# Patient Record
Sex: Female | Born: 1990 | Race: Black or African American | Hispanic: No | Marital: Single | Smoking: Former smoker
Health system: Southern US, Community
[De-identification: ages and names within clinical notes are randomized; demographics above are authoritative.]

## PROBLEM LIST (undated history)

## (undated) ENCOUNTER — Inpatient Hospital Stay (HOSPITAL_COMMUNITY): Payer: Self-pay

## (undated) DIAGNOSIS — Z8489 Family history of other specified conditions: Secondary | ICD-10-CM

## (undated) DIAGNOSIS — Z8619 Personal history of other infectious and parasitic diseases: Secondary | ICD-10-CM

## (undated) DIAGNOSIS — O139 Gestational [pregnancy-induced] hypertension without significant proteinuria, unspecified trimester: Secondary | ICD-10-CM

## (undated) DIAGNOSIS — D649 Anemia, unspecified: Secondary | ICD-10-CM

## (undated) DIAGNOSIS — Z87891 Personal history of nicotine dependence: Secondary | ICD-10-CM

## (undated) DIAGNOSIS — F419 Anxiety disorder, unspecified: Secondary | ICD-10-CM

## (undated) DIAGNOSIS — O1490 Unspecified pre-eclampsia, unspecified trimester: Secondary | ICD-10-CM

## (undated) DIAGNOSIS — Z9089 Acquired absence of other organs: Secondary | ICD-10-CM

## (undated) DIAGNOSIS — O43129 Velamentous insertion of umbilical cord, unspecified trimester: Secondary | ICD-10-CM

## (undated) DIAGNOSIS — R519 Headache, unspecified: Secondary | ICD-10-CM

## (undated) DIAGNOSIS — R51 Headache: Secondary | ICD-10-CM

## (undated) HISTORY — DX: Headache: R51

## (undated) HISTORY — PX: TONSILLECTOMY: SUR1361

## (undated) HISTORY — DX: Unspecified pre-eclampsia, unspecified trimester: O14.90

## (undated) HISTORY — DX: Anxiety disorder, unspecified: F41.9

## (undated) HISTORY — DX: Headache, unspecified: R51.9

## (undated) HISTORY — DX: Personal history of other infectious and parasitic diseases: Z86.19

## (undated) HISTORY — DX: Family history of other specified conditions: Z84.89

## (undated) HISTORY — DX: Velamentous insertion of umbilical cord, unspecified trimester: O43.129

---
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
## 2010-01-18 ENCOUNTER — Emergency Department (HOSPITAL_COMMUNITY)
Admission: EM | Admit: 2010-01-18 | Discharge: 2010-01-18 | Payer: Self-pay | Source: Home / Self Care | Admitting: Emergency Medicine

## 2010-02-01 ENCOUNTER — Emergency Department (HOSPITAL_COMMUNITY)
Admission: EM | Admit: 2010-02-01 | Discharge: 2010-02-01 | Payer: Self-pay | Source: Home / Self Care | Admitting: Emergency Medicine

## 2010-02-01 LAB — URINALYSIS, ROUTINE W REFLEX MICROSCOPIC
Bilirubin Urine: NEGATIVE
Hemoglobin, Urine: NEGATIVE
Ketones, ur: NEGATIVE mg/dL
Nitrite: NEGATIVE
Protein, ur: NEGATIVE mg/dL
Specific Gravity, Urine: 1.014 (ref 1.005–1.030)
Urine Glucose, Fasting: NEGATIVE mg/dL
Urobilinogen, UA: 1 mg/dL (ref 0.0–1.0)
pH: 6.5 (ref 5.0–8.0)

## 2010-02-01 LAB — POCT PREGNANCY, URINE: Preg Test, Ur: NEGATIVE

## 2010-02-01 LAB — URINE MICROSCOPIC-ADD ON

## 2010-03-24 ENCOUNTER — Inpatient Hospital Stay (INDEPENDENT_AMBULATORY_CARE_PROVIDER_SITE_OTHER)
Admission: RE | Admit: 2010-03-24 | Discharge: 2010-03-24 | Disposition: A | Discharge status: Home / Self Care | Payer: 59 | Source: Ambulatory Visit | Attending: Family Medicine | Admitting: Family Medicine

## 2010-03-24 DIAGNOSIS — J020 Streptococcal pharyngitis: Secondary | ICD-10-CM

## 2010-03-24 LAB — POCT URINALYSIS DIPSTICK
Hgb urine dipstick: NEGATIVE
Nitrite: NEGATIVE
Protein, ur: 30 mg/dL — AB
Specific Gravity, Urine: 1.02 (ref 1.005–1.030)
Urine Glucose, Fasting: NEGATIVE mg/dL
Urobilinogen, UA: 1 mg/dL (ref 0.0–1.0)
pH: 7 (ref 5.0–8.0)

## 2010-03-24 LAB — POCT PREGNANCY, URINE: Preg Test, Ur: NEGATIVE

## 2010-03-24 LAB — POCT RAPID STREP A (OFFICE): Streptococcus, Group A Screen (Direct): POSITIVE — AB

## 2010-07-22 ENCOUNTER — Emergency Department (HOSPITAL_COMMUNITY)
Admission: EM | Admit: 2010-07-22 | Discharge: 2010-07-22 | Disposition: A | Discharge status: Home / Self Care | Payer: 59 | Attending: Emergency Medicine | Admitting: Emergency Medicine

## 2010-07-22 DIAGNOSIS — R51 Headache: Secondary | ICD-10-CM | POA: Insufficient documentation

## 2010-07-22 DIAGNOSIS — R42 Dizziness and giddiness: Secondary | ICD-10-CM | POA: Insufficient documentation

## 2010-07-22 DIAGNOSIS — R110 Nausea: Secondary | ICD-10-CM | POA: Insufficient documentation

## 2010-07-27 ENCOUNTER — Emergency Department (HOSPITAL_COMMUNITY): Payer: 59

## 2010-07-27 ENCOUNTER — Emergency Department (HOSPITAL_COMMUNITY)
Admission: EM | Admit: 2010-07-27 | Discharge: 2010-07-27 | Disposition: A | Discharge status: Home / Self Care | Payer: 59 | Attending: Emergency Medicine | Admitting: Emergency Medicine

## 2010-07-27 DIAGNOSIS — R51 Headache: Secondary | ICD-10-CM | POA: Insufficient documentation

## 2010-07-27 DIAGNOSIS — J029 Acute pharyngitis, unspecified: Secondary | ICD-10-CM | POA: Insufficient documentation

## 2010-07-27 DIAGNOSIS — R110 Nausea: Secondary | ICD-10-CM | POA: Insufficient documentation

## 2010-07-27 LAB — URINALYSIS, ROUTINE W REFLEX MICROSCOPIC
Glucose, UA: NEGATIVE mg/dL
Hgb urine dipstick: NEGATIVE
Ketones, ur: NEGATIVE mg/dL
Nitrite: NEGATIVE
Protein, ur: NEGATIVE mg/dL
Specific Gravity, Urine: 1.026 (ref 1.005–1.030)
Urobilinogen, UA: 0.2 mg/dL (ref 0.0–1.0)
pH: 5.5 (ref 5.0–8.0)

## 2010-07-27 LAB — RAPID STREP SCREEN (MED CTR MEBANE ONLY): Streptococcus, Group A Screen (Direct): NEGATIVE

## 2010-07-27 LAB — POCT PREGNANCY, URINE: Preg Test, Ur: NEGATIVE

## 2010-07-27 LAB — URINE MICROSCOPIC-ADD ON

## 2010-11-17 ENCOUNTER — Emergency Department (HOSPITAL_COMMUNITY): Payer: 59

## 2010-11-17 ENCOUNTER — Emergency Department (HOSPITAL_COMMUNITY)
Admission: EM | Admit: 2010-11-17 | Discharge: 2010-11-17 | Disposition: A | Discharge status: Home / Self Care | Payer: 59 | Attending: Emergency Medicine | Admitting: Emergency Medicine

## 2010-11-17 DIAGNOSIS — M25569 Pain in unspecified knee: Secondary | ICD-10-CM | POA: Insufficient documentation

## 2010-11-17 DIAGNOSIS — M542 Cervicalgia: Secondary | ICD-10-CM | POA: Insufficient documentation

## 2010-11-17 DIAGNOSIS — M545 Low back pain, unspecified: Secondary | ICD-10-CM | POA: Insufficient documentation

## 2010-11-17 DIAGNOSIS — S335XXA Sprain of ligaments of lumbar spine, initial encounter: Secondary | ICD-10-CM | POA: Insufficient documentation

## 2010-11-17 DIAGNOSIS — S8000XA Contusion of unspecified knee, initial encounter: Secondary | ICD-10-CM | POA: Insufficient documentation

## 2010-11-17 DIAGNOSIS — S139XXA Sprain of joints and ligaments of unspecified parts of neck, initial encounter: Secondary | ICD-10-CM | POA: Insufficient documentation

## 2011-01-29 ENCOUNTER — Emergency Department (INDEPENDENT_AMBULATORY_CARE_PROVIDER_SITE_OTHER)
Admission: EM | Admit: 2011-01-29 | Discharge: 2011-01-29 | Disposition: A | Discharge status: Home / Self Care | Payer: 59 | Source: Home / Self Care | Attending: Emergency Medicine | Admitting: Emergency Medicine

## 2011-01-29 ENCOUNTER — Encounter: Payer: Self-pay | Admitting: Emergency Medicine

## 2011-01-29 DIAGNOSIS — S8012XA Contusion of left lower leg, initial encounter: Secondary | ICD-10-CM

## 2011-01-29 DIAGNOSIS — S8010XA Contusion of unspecified lower leg, initial encounter: Secondary | ICD-10-CM

## 2011-01-29 MED ORDER — IBUPROFEN 600 MG PO TABS: 600.0000 mg | ORAL_TABLET | Freq: Four times a day (QID) | ORAL | Status: AC | PRN

## 2011-01-29 MED ORDER — HYDROCODONE-ACETAMINOPHEN 5-325 MG PO TABS: 2.0000 | ORAL_TABLET | ORAL | Status: AC | PRN

## 2011-01-29 NOTE — ED Notes (Signed)
Patient was lying on floor, on stomach, sister stepped on leg.  Leg was between sister's foot and floor.  Pain in calf of leg.  Described as sharp with weight bearing

## 2011-02-02 NOTE — ED Provider Notes (Signed)
History     CSN: [id number]  Arrival date & time 01/29/11  1027   First MD Initiated Contact with Patient 01/29/11 1103      Chief Complaint  Patient presents with  . Leg Swelling    (Consider location/radiation/quality/duration/timing/severity/associated sxs/prior treatment) HPI Comments: Pt was lying on the floor and her sister accidentally stepped on pt's calf yesterday. Now c/o pain, ild swelling left posterior calf. Pain with walking, stretching it. No bruising, redness, abrasion,   Patient is a 21 y.o. female presenting with leg pain. The history is provided by the patient.  Leg Pain  The incident occurred yesterday. The injury mechanism was a direct blow. The pain is present in the left leg. The quality of the pain is described as aching. Pertinent negatives include no numbness, no inability to bear weight, no loss of motion, no muscle weakness, no loss of sensation and no tingling. The symptoms are aggravated by bearing weight. She has tried nothing for the symptoms.    Past Medical History  Diagnosis Date  . Asthma     Past Surgical History  Procedure Date  . Tonsillectomy     No family history on file.  History  Substance Use Topics  . Smoking status: Never Smoker   . Smokeless tobacco: Not on file  . Alcohol Use: No    OB History    Grav Para Term Preterm Abortions TAB SAB Ect Mult Living                  Review of Systems  Constitutional: Negative for fever.  Musculoskeletal: Positive for myalgias.  Skin: Negative for rash and wound.  Neurological: Negative for tingling and numbness.  Hematological: Does not bruise/bleed easily.    Allergies  Review of patient's allergies indicates no known allergies.  Home Medications   Current Outpatient Rx  Name Route Sig Dispense Refill  . HYDROCODONE-ACETAMINOPHEN 5-325 MG PO TABS Oral Take 2 tablets by mouth every 4 (four) hours as needed for pain. 20 tablet 0  . IBUPROFEN 600 MG PO TABS Oral Take 1  tablet (600 mg total) by mouth every 6 (six) hours as needed for pain. 30 tablet 0    BP 106/68  Pulse 67  Temp(Src) 98.1 F (36.7 C) (Oral)  Resp 18  SpO2 98%  LMP 01/20/2011  Physical Exam  Nursing note and vitals reviewed. Constitutional: She is oriented to person, place, and time. She appears well-developed and well-nourished. No distress.  HENT:  Head: Normocephalic and atraumatic.  Eyes: EOM are normal. Pupils are equal, round, and reactive to light.  Neck: Normal range of motion.  Cardiovascular: Regular rhythm.   Pulmonary/Chest: Effort normal and breath sounds normal.  Abdominal: She exhibits no distension.  Musculoskeletal: Normal range of motion.       Right lower leg: She exhibits tenderness. She exhibits no bony tenderness, no edema, no deformity and no laceration.       Mild swelling, no bruising. Thompson neg. Sensation grossly intact, DP 2+  Neurological: She is alert and oriented to person, place, and time.  Skin: Skin is warm and dry.  Psychiatric: She has a normal mood and affect. Her behavior is normal. Judgment and thought content normal.    ED Course  Procedures (including critical care time)  Labs Reviewed - No data to display No results found.   1. Contusion of left lower leg       MDM      Luiz Blare, MD  02/02/11 0805 

## 2011-04-23 ENCOUNTER — Emergency Department (INDEPENDENT_AMBULATORY_CARE_PROVIDER_SITE_OTHER)
Admission: EM | Admit: 2011-04-23 | Discharge: 2011-04-23 | Disposition: A | Discharge status: Home / Self Care | Payer: 59 | Source: Home / Self Care | Attending: Emergency Medicine | Admitting: Emergency Medicine

## 2011-04-23 ENCOUNTER — Encounter (HOSPITAL_COMMUNITY): Payer: Self-pay | Admitting: *Deleted

## 2011-04-23 DIAGNOSIS — A0811 Acute gastroenteropathy due to Norwalk agent: Secondary | ICD-10-CM

## 2011-04-23 MED ORDER — ONDANSETRON 8 MG PO TBDP: 8.0000 mg | ORAL_TABLET | Freq: Three times a day (TID) | ORAL | Status: AC | PRN

## 2011-04-23 MED ORDER — ONDANSETRON 4 MG PO TBDP: 8.0000 mg | ORAL_TABLET | Freq: Once | ORAL | Status: DC

## 2011-04-23 MED ORDER — DIPHENOXYLATE-ATROPINE 2.5-0.025 MG PO TABS: 1.0000 | ORAL_TABLET | Freq: Four times a day (QID) | ORAL | Status: AC | PRN

## 2011-04-23 MED ORDER — ONDANSETRON 4 MG PO TBDP
ORAL_TABLET | ORAL | Status: AC
  Filled 2011-04-23: qty 2

## 2011-04-23 NOTE — ED Notes (Signed)
Has  Symptoms  Of  Nausea   Vomiting  As  Well  As  Diarrhea  Which  She  Reports  She  Has  Had   X  2  Days  She  Woke  Up  With  The  Symptoms  yest  Am      She  Reports        She  Has  Been  Drinking  Clear  Liquids        -  She  Is    Alert  Oriented  Speaking in  Complete  sentances

## 2011-04-23 NOTE — ED Provider Notes (Signed)
Chief Complaint  Patient presents with  . Diarrhea    History of Present Illness:   The patient is a 21 year old female who works at [employer]. There has been an outbreak of Norovirus there and she's been exposed to multiple people with it. At 10 AM yesterday she developed symptoms of nausea, vomiting, and diarrhea. She estimates that she vomited twice yesterday and 8 times today. There was no blood at or coffee-ground material in the emesis, but it did look green on one occasion. She estimates 7 loose stools yesterday and up to 7 today as well without blood or mucus. She feels chilled but no fever. She has some periumbilical, crampy abdominal pain. No fever or sweats. No urinary or GYN complaints.  Review of Systems:  Other than noted above, the patient denies any of the following symptoms: Systemic:  No fevers, chills, sweats, weight loss or gain, fatigue, or tiredness. ENT:  No nasal congestion, rhinorrhea, or sore throat. Lungs:  No cough, wheezing, or shortness of breath. Cardiac:  No chest pain, syncope, or presyncope. GI:  No abdominal pain, nausea, vomiting, anorexia, diarrhea, constipation, blood in stool or vomitus. GU:  No dysuria, frequency, or urgency.  PMFSH:  Past medical history, family history, social history, meds, and allergies were reviewed.  Physical Exam:   Vital signs:  BP 116/80  Pulse 78  Temp(Src) 98.6 F (37 C) (Oral)  Resp 16  SpO2 100%  LMP 04/12/2011 General:  Alert and oriented.  In no distress.  Skin warm and dry.  Good skin turgor, brisk capillary refill. ENT:  No scleral icterus, moist mucous membranes, no oral lesions, pharynx clear. Lungs:  Breath sounds clear and equal bilaterally.  No wheezes, rales, or rhonchi. Heart:  Rhythm regular, without extrasystoles.  No gallops or murmers. Abdomen:  Abdomen was soft, flat, nondistended. She has mild generalized tenderness to palpation without guarding or rebound. There was no localized tenderness to  palpation. No organomegaly or mass. Bowel sounds are hyperactive. Skin: Clear, warm, and dry.  Good turgor.  Brisk capillary refill.   Course in Urgent Care Center:   She was given Zofran ODT 8 mg sublingually here and tolerated it well without any side effects. Thereafter she was able to keep down sips of ginger ale.  Assessment:   Diagnoses that have been ruled out:  None  Diagnoses that are still under consideration:  None  Final diagnoses:  Gastroenteritis due to norovirus    Plan:   1.  The following meds were prescribed:   New Prescriptions   DIPHENOXYLATE-ATROPINE (LOMOTIL) 2.5-0.025 MG PER TABLET    Take 1 tablet by mouth 4 (four) times daily as needed for diarrhea or loose stools.   ONDANSETRON (ZOFRAN ODT) 8 MG DISINTEGRATING TABLET    Take 1 tablet (8 mg total) by mouth every 8 (eight) hours as needed for nausea.   2.  The patient was instructed in symptomatic care and handouts were given. 3.  The patient was told to return if becoming worse in any way, if no better in 2 or 3 days, and given some red flag symptoms that would indicate earlier return. 4.  The patient was told to take only sips of clear liquids for the next 24 hours and then advance to a b.r.a.t. Diet.      Reuben Likes, MD 04/23/11 2158

## 2011-04-23 NOTE — Discharge Instructions (Signed)

## 2011-07-05 ENCOUNTER — Emergency Department (HOSPITAL_COMMUNITY)
Admission: EM | Admit: 2011-07-05 | Discharge: 2011-07-05 | Disposition: A | Discharge status: Home / Self Care | Payer: 59 | Source: Home / Self Care | Attending: Emergency Medicine | Admitting: Emergency Medicine

## 2011-07-05 ENCOUNTER — Encounter (HOSPITAL_COMMUNITY): Payer: Self-pay | Admitting: *Deleted

## 2011-07-05 DIAGNOSIS — M79609 Pain in unspecified limb: Secondary | ICD-10-CM

## 2011-07-05 DIAGNOSIS — M79646 Pain in unspecified finger(s): Secondary | ICD-10-CM

## 2011-07-05 MED ORDER — PREDNISONE 10 MG PO TABS: 50.0000 mg | ORAL_TABLET | Freq: Every day | ORAL | Status: DC

## 2011-07-05 NOTE — Discharge Instructions (Signed)
Try calling [facility] to find a regular doctor.  If your thumb is not getting better, follow up with a regular doctor or the hand doctor (Dr. Amanda Pea).

## 2011-07-05 NOTE — ED Provider Notes (Signed)
History     CSN: [id number]  Arrival date & time 07/05/11  1150   First MD Initiated Contact with Patient 07/05/11 1356      Chief Complaint  Patient presents with  . Hand Pain    (Consider location/radiation/quality/duration/timing/severity/associated sxs/prior treatment) HPI Comments: Pt denies injury.  R thumb pain onset 6/4, pt woke up with it.  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  Reports thumb was very swollen and painful, now swelling is mostly gone but thumb still hurts.   Patient is a 21 y.o. female presenting with hand pain. The history is provided by the patient.  Hand Pain This is a new problem. The current episode started more than 2 days ago. The problem occurs constantly. The problem has been gradually improving. The symptoms are aggravated by exertion. The symptoms are relieved by nothing. Treatments tried: thumb splint/brace. The treatment provided mild relief.    Past Medical History  Diagnosis Date  . Asthma     Past Surgical History  Procedure Date  . Tonsillectomy     History reviewed. No pertinent family history.  History  Substance Use Topics  . Smoking status: Never Smoker   . Smokeless tobacco: Not on file  . Alcohol Use: No    OB History    Grav Para Term Preterm Abortions TAB SAB Ect Mult Living                  Review of Systems  Constitutional: Negative for fever and chills.  Musculoskeletal:       Thumb pain and swelling  Skin: Negative for color change and wound.  Neurological: Negative for weakness and numbness.    Allergies  Review of patient's allergies indicates no known allergies.  Home Medications   Current Outpatient Rx  Name Route Sig Dispense Refill  . ALBUTEROL SULFATE HFA 108 (90 BASE) MCG/ACT IN AERS Inhalation Inhale 2 puffs into the lungs every 6 (six) hours as needed.    Marland Kitchen PREDNISONE 10 MG PO TABS Oral Take 5 tablets (50 mg total) by  mouth daily. 25 tablet 0    BP 120/82  Pulse 84  Temp(Src) 98.5 F (36.9 C) (Oral)  Resp 16  SpO2 100%  LMP 06/12/2011  Physical Exam  Constitutional: She is oriented to person, place, and time. She appears well-developed and well-nourished. No distress.  Pulmonary/Chest: Effort normal.  Musculoskeletal:       Right hand: She exhibits tenderness. She exhibits normal range of motion, no deformity and no swelling.       Hands: Neurological: She is alert and oriented to person, place, and time. No sensory deficit. She exhibits normal muscle tone.       Unable to assess strength of R thumb due to pain  Skin: Skin is warm and dry. No rash noted.    ED Course  Procedures (including critical care time)  Labs Reviewed - No data to display No results found.   1. Thumb pain       MDM  Uncertain etiology of thumb pain.  Possibly gout, but little swelling and no warmth.  Possibly strain, but pt can't identify causative injury.  Will treat with prednisone; if this plan does not resolve sx, pt to engage with pcp and f/u.  If sx become much worse, pt has contact info for hand for f/u.         Cathlyn Parsons, NP  07/05/11 1546 

## 2011-07-05 NOTE — ED Provider Notes (Signed)
Medical screening examination/treatment/procedure(s) were performed by non-physician practitioner and as supervising physician I was immediately available for consultation/collaboration.  Raynald Blend, MD 07/05/11 1651

## 2011-07-05 NOTE — ED Notes (Signed)
Pt with right thumb pain and swelling onset Tuesday - denies injury -

## 2011-10-09 ENCOUNTER — Other Ambulatory Visit: Payer: Self-pay | Admitting: Family Medicine

## 2011-10-09 DIAGNOSIS — R51 Headache: Secondary | ICD-10-CM

## 2011-10-11 ENCOUNTER — Ambulatory Visit
Admission: RE | Admit: 2011-10-11 | Discharge: 2011-10-11 | Disposition: A | Discharge status: Home / Self Care | Payer: 59 | Source: Ambulatory Visit | Attending: Family Medicine | Admitting: Family Medicine

## 2011-10-11 ENCOUNTER — Other Ambulatory Visit: Payer: 59

## 2011-10-11 DIAGNOSIS — R51 Headache: Secondary | ICD-10-CM

## 2011-10-11 MED ORDER — GADOBENATE DIMEGLUMINE 529 MG/ML IV SOLN
13.0000 mL | Freq: Once | INTRAVENOUS | Status: AC | PRN
  Administered 2011-10-11: 13 mL via INTRAVENOUS

## 2011-10-13 ENCOUNTER — Other Ambulatory Visit: Payer: Self-pay | Admitting: Family Medicine

## 2011-10-13 DIAGNOSIS — R519 Headache, unspecified: Secondary | ICD-10-CM

## 2011-10-13 DIAGNOSIS — R55 Syncope and collapse: Secondary | ICD-10-CM

## 2011-10-23 ENCOUNTER — Encounter (HOSPITAL_COMMUNITY): Payer: Self-pay | Admitting: *Deleted

## 2011-10-23 ENCOUNTER — Emergency Department (INDEPENDENT_AMBULATORY_CARE_PROVIDER_SITE_OTHER)
Admission: EM | Admit: 2011-10-23 | Discharge: 2011-10-23 | Disposition: A | Discharge status: Home / Self Care | Payer: 59 | Source: Home / Self Care | Attending: Emergency Medicine | Admitting: Emergency Medicine

## 2011-10-23 DIAGNOSIS — J040 Acute laryngitis: Secondary | ICD-10-CM

## 2011-10-23 LAB — POCT URINALYSIS DIP (DEVICE)
Bilirubin Urine: NEGATIVE
Glucose, UA: NEGATIVE mg/dL
Leukocytes, UA: NEGATIVE
Nitrite: NEGATIVE
Urobilinogen, UA: 0.2 mg/dL (ref 0.0–1.0)
pH: 6.5 (ref 5.0–8.0)

## 2011-10-23 MED ORDER — PREDNISONE 5 MG PO KIT: 1.0000 | PACK | Freq: Every day | ORAL | Status: DC

## 2011-10-23 MED ORDER — BENZONATATE 200 MG PO CAPS: 200.0000 mg | ORAL_CAPSULE | Freq: Three times a day (TID) | ORAL | Status: DC | PRN

## 2011-10-23 NOTE — ED Notes (Addendum)
URINALYSIS DIP COMPLETED BY ACCIDENT, NO ORDERS PLACED FOR IT, JUST A PREGNANCY WAS ORDERED..... PER [facility] EMT

## 2011-10-23 NOTE — ED Provider Notes (Signed)
Chief Complaint  Patient presents with  . Sore Throat    History of Present Illness:   The patient is a 21 year old female who has had a two-day history of sore throat, hoarseness, cough productive yellow sputum, aching in the ears, nasal congestion with yellow rhinorrhea, and she vomited once. She denies any fever, chills, headache, wheezing, chest pain, abdominal pain, or diarrhea. Her last menstrual period was August 15. She is sexually active without use of birth control.  Review of Systems:  Other than noted above, the patient denies any of the following symptoms. Systemic:  No fever, chills, sweats, fatigue, myalgias, headache, or anorexia. Eye:  No redness, pain or drainage. ENT:  No earache, ear congestion, nasal congestion, sneezing, rhinorrhea, sinus pressure, sinus pain, post nasal drip, or sore throat. Lungs:  No cough, sputum production, wheezing, shortness of breath, or chest pain. GI:  No abdominal pain, nausea, vomiting, or diarrhea.  PMFSH:  Past medical history, family history, social history, meds, and allergies were reviewed.  Physical Exam:   Vital signs:  BP 111/70  Pulse 66  Temp 98.6 F (37 C) (Oral)  Resp 18  SpO2 100%  LMP 09/11/2011 General:  Alert, in no distress. Eye:  No conjunctival injection or drainage. Lids were normal. ENT:  TMs and canals were normal, without erythema or inflammation.  Nasal mucosa was clear and uncongested, without drainage.  Mucous membranes were moist.  Pharynx was clear, without exudate or drainage.  There were no oral ulcerations or lesions. Neck:  Supple, no adenopathy, tenderness or mass. Lungs:  No respiratory distress.  Lungs were clear to auscultation, without wheezes, rales or rhonchi.  Breath sounds were clear and equal bilaterally.  Heart:  Regular rhythm, without gallops, murmers or rubs. Skin:  Clear, warm, and dry, without rash or lesions.  Labs:   Results for orders placed during the hospital encounter of 10/23/11    POCT RAPID STREP A ([facility] ONLY)      Component Value Range   Streptococcus, Group A Screen (Direct) NEGATIVE  NEGATIVE  POCT URINALYSIS DIP (DEVICE)      Component Value Range   Glucose, UA NEGATIVE  NEGATIVE mg/dL   Bilirubin Urine NEGATIVE  NEGATIVE   Ketones, ur NEGATIVE  NEGATIVE mg/dL   Specific Gravity, Urine 1.020  1.005 - 1.030   Hgb urine dipstick NEGATIVE  NEGATIVE   pH 6.5  5.0 - 8.0   Protein, ur NEGATIVE  NEGATIVE mg/dL   Urobilinogen, UA 0.2  0.0 - 1.0 mg/dL   Nitrite NEGATIVE  NEGATIVE   Leukocytes, UA NEGATIVE  NEGATIVE  POCT PREGNANCY, URINE      Component Value Range   Preg Test, Ur NEGATIVE  NEGATIVE   Assessment:  The encounter diagnosis was Laryngitis.  Plan:   1.  The following meds were prescribed:   New Prescriptions   BENZONATATE (TESSALON) 200 MG CAPSULE    Take 1 capsule (200 mg total) by mouth 3 (three) times daily as needed for cough.   PREDNISONE 5 MG KIT    Take 1 kit (5 mg total) by mouth daily after breakfast. Prednisone 5 mg 6 day dosepack.  Take as directed.   2.  The patient was instructed in symptomatic care and handouts were given. 3.  The patient was told to return if becoming worse in any way, if no better in 3 or 4 days, and given some red flag symptoms that would indicate earlier return.   Reuben Likes,  MD 10/23/11 2136

## 2011-10-23 NOTE — ED Notes (Signed)
Pt reports sore throat since Tuesday, coughing yellow mucous

## 2012-05-19 ENCOUNTER — Encounter (HOSPITAL_COMMUNITY): Payer: Self-pay | Admitting: Emergency Medicine

## 2012-05-19 ENCOUNTER — Emergency Department (HOSPITAL_COMMUNITY)
Admission: EM | Admit: 2012-05-19 | Discharge: 2012-05-19 | Disposition: A | Discharge status: Home / Self Care | Payer: 59 | Attending: Emergency Medicine | Admitting: Emergency Medicine

## 2012-05-19 ENCOUNTER — Emergency Department (HOSPITAL_COMMUNITY): Payer: 59

## 2012-05-19 DIAGNOSIS — Z79899 Other long term (current) drug therapy: Secondary | ICD-10-CM | POA: Insufficient documentation

## 2012-05-19 DIAGNOSIS — R0602 Shortness of breath: Secondary | ICD-10-CM | POA: Insufficient documentation

## 2012-05-19 DIAGNOSIS — R059 Cough, unspecified: Secondary | ICD-10-CM | POA: Insufficient documentation

## 2012-05-19 DIAGNOSIS — R0789 Other chest pain: Secondary | ICD-10-CM | POA: Insufficient documentation

## 2012-05-19 DIAGNOSIS — J45909 Unspecified asthma, uncomplicated: Secondary | ICD-10-CM | POA: Insufficient documentation

## 2012-05-19 DIAGNOSIS — R079 Chest pain, unspecified: Secondary | ICD-10-CM

## 2012-05-19 DIAGNOSIS — R05 Cough: Secondary | ICD-10-CM | POA: Insufficient documentation

## 2012-05-19 LAB — CBC WITH DIFFERENTIAL/PLATELET
Basophils Absolute: 0 10*3/uL (ref 0.0–0.1)
Basophils Relative: 1 % (ref 0–1)
Eosinophils Absolute: 0.4 10*3/uL (ref 0.0–0.7)
Eosinophils Relative: 8 % — ABNORMAL HIGH (ref 0–5)
Lymphs Abs: 2.5 10*3/uL (ref 0.7–4.0)
MCH: 27.4 pg (ref 26.0–34.0)
Neutrophils Relative %: 38 % — ABNORMAL LOW (ref 43–77)
Platelets: 274 10*3/uL (ref 150–400)
RBC: 4.23 MIL/uL (ref 3.87–5.11)
RDW: 13.2 % (ref 11.5–15.5)

## 2012-05-19 LAB — COMPREHENSIVE METABOLIC PANEL
ALT: 12 U/L (ref 0–35)
AST: 15 U/L (ref 0–37)
Albumin: 4 g/dL (ref 3.5–5.2)
Alkaline Phosphatase: 48 U/L (ref 39–117)
Calcium: 9.6 mg/dL (ref 8.4–10.5)
Glucose, Bld: 102 mg/dL — ABNORMAL HIGH (ref 70–99)
Potassium: 3.9 mEq/L (ref 3.5–5.1)
Sodium: 136 mEq/L (ref 135–145)
Total Protein: 7.8 g/dL (ref 6.0–8.3)

## 2012-05-19 MED ORDER — ALBUTEROL SULFATE HFA 108 (90 BASE) MCG/ACT IN AERS
2.0000 | INHALATION_SPRAY | Freq: Four times a day (QID) | RESPIRATORY_TRACT | Status: DC | PRN
  Administered 2012-05-19: 2 via RESPIRATORY_TRACT
  Filled 2012-05-19: qty 6.7

## 2012-05-19 NOTE — ED Notes (Signed)
PT. REPORTS LEFT CHEST PAIN WITH SOB WORSE WHEN LYING ONSET THIS EVENING , OCCASIONAL DRY COUGH , NO NAUSEA OR DIAPHORESIS.

## 2012-05-19 NOTE — ED Provider Notes (Signed)
History     CSN: [id number]  Arrival date & time 05/19/12  0151   First MD Initiated Contact with Patient 05/19/12 0602      Chief Complaint  Patient presents with  . Chest Pain    (Consider location/radiation/quality/duration/timing/severity/associated sxs/prior treatment) Patient is a 22 y.o. female presenting with chest pain. The history is provided by the patient. No language interpreter was used.  Chest Pain Associated symptoms: shortness of breath   Associated symptoms: no cough and no fever   Pt is a 22yo female c/o SOB and left chest pain when trying to lay down last night.  States symptoms started around 0:00 last night.  States this has occurred in the past without chest pain.  Chest pain is sore and aching in nature, mild to moderate on left side of chest.  Occasion dry cough w/o nausea or diaphoresis.  States has felt well otherwise.  Denies fever, chills, n/v/d.  Does have hx of asthma but ran out of prescribed albuterol inhaler.  Pt has not tried anything for pain.  Denies sick contacts or recent travel.    Past Medical History  Diagnosis Date  . Asthma     Past Surgical History  Procedure Laterality Date  . Tonsillectomy      No family history on file.  History  Substance Use Topics  . Smoking status: Never Smoker   . Smokeless tobacco: Not on file  . Alcohol Use: No    OB History   Grav Para Term Preterm Abortions TAB SAB Ect Mult Living                  Review of Systems  Constitutional: Negative for fever and chills.  Respiratory: Positive for chest tightness and shortness of breath. Negative for cough, wheezing and stridor.   Cardiovascular: Positive for chest pain.    Allergies  Review of patient's allergies indicates no known allergies.  Home Medications   Current Outpatient Rx  Name  Route  Sig  Dispense  Refill  . albuterol (PROVENTIL HFA;VENTOLIN HFA) 108 (90 BASE) MCG/ACT inhaler   Inhalation   Inhale 2 puffs into the lungs every 6  (six) hours as needed for shortness of breath.          . clobetasol ointment (TEMOVATE) 0.05 %   Topical   Apply 1 application topically as needed (to hands after washing or if inflamed).           BP 113/81  Pulse 79  Temp(Src) 98.1 F (36.7 C) (Oral)  Resp 15  SpO2 99%  LMP 04/10/2012  Physical Exam  Nursing note and vitals reviewed. Constitutional: She appears well-developed and well-nourished. No distress.  Well appearing female sitting up in bed, no acute distress   HENT:  Head: Normocephalic and atraumatic.  Right Ear: External ear normal.  Left Ear: External ear normal.  Mouth/Throat: Oropharynx is clear and moist. No oropharyngeal exudate.  Eyes: Conjunctivae are normal. No scleral icterus.  Neck: Normal range of motion. Neck supple. No JVD present. No tracheal deviation present. No thyromegaly present.  Cardiovascular: Normal rate, regular rhythm and normal heart sounds.   Pulmonary/Chest: Effort normal and breath sounds normal. No stridor. No respiratory distress. She has no wheezes. She has no rales. She exhibits tenderness ( left chest wall, reproducable ).  Pt speaking in full sentences.  No distress. Lungs CTAB, no wheezes or rales.   Abdominal: Soft. Bowel sounds are normal. She exhibits no distension. There is no  tenderness.  Musculoskeletal: Normal range of motion.  Lymphadenopathy:    She has no cervical adenopathy.  Neurological: She is alert.  Skin: Skin is warm and dry. She is not diaphoretic.    ED Course  Procedures (including critical care time)  Labs Reviewed  CBC WITH DIFFERENTIAL - Abnormal; Notable for the following:    Hemoglobin 11.6 (*)    HCT 33.4 (*)    Neutrophils Relative 38 (*)    Lymphocytes Relative 48 (*)    Eosinophils Relative 8 (*)    All other components within normal limits  COMPREHENSIVE METABOLIC PANEL - Abnormal; Notable for the following:    Glucose, Bld 102 (*)    Total Bilirubin 0.2 (*)    All other components  within normal limits  POCT I-STAT TROPONIN I   Dg Chest 2 View  05/19/2012  *RADIOLOGY REPORT*  Clinical Data: Left-sided chest pain and shortness of breath.  CHEST - 2 VIEW  Comparison: 01/18/2010  Findings: Midline trachea.  Normal heart size and mediastinal contours. No pleural effusion or pneumothorax.  Clear lungs.  IMPRESSION: No acute cardiopulmonary disease.   Original Report Authenticated By: Jeronimo Greaves, M.D.      1. Chest pain       MDM  Pt c/o chest tightness and SOB while lying down to go to sleep last night.  Hx of asthma but not this chest pain.  States she believes she has sleep apnea b/c it has happened in the past, however, pt is not obese.    Chest: mild TTP left chest wall.  Heart-RRR, no m/r/g. Lungs: CTAB, no wheezes, rubs, or rhonchi.    PERC neg.   CXR: nl  Labs: cbc-mild anemia, CMP-nl, troponin-neg  Rx: albuterol inhaler  [facility]  Referral to Dr. Emeline Darling, ENT for f/u if night time symptoms continue as described this morning in ED.  Provided pt info packet on sleep apnea.   Vitals: unremarkable. Discharged in stable condition.    Discussed pt with attending during ED encounter.     Junius Finner, PA-C 05/19/12 1701

## 2012-05-20 NOTE — ED Provider Notes (Signed)
Medical screening examination/treatment/procedure(s) were performed by non-physician practitioner and as supervising physician I was immediately available for consultation/collaboration.   Hanley Seamen, MD 05/20/12 [id number]

## 2013-02-14 ENCOUNTER — Encounter (HOSPITAL_COMMUNITY): Payer: Self-pay | Admitting: Emergency Medicine

## 2013-02-14 ENCOUNTER — Emergency Department (HOSPITAL_COMMUNITY)
Admission: EM | Admit: 2013-02-14 | Discharge: 2013-02-14 | Discharge status: Against Medical Advice | Payer: 59 | Attending: Emergency Medicine | Admitting: Emergency Medicine

## 2013-02-14 DIAGNOSIS — R1084 Generalized abdominal pain: Secondary | ICD-10-CM | POA: Insufficient documentation

## 2013-02-14 DIAGNOSIS — R6883 Chills (without fever): Secondary | ICD-10-CM | POA: Insufficient documentation

## 2013-02-14 DIAGNOSIS — J45909 Unspecified asthma, uncomplicated: Secondary | ICD-10-CM | POA: Insufficient documentation

## 2013-02-14 DIAGNOSIS — R112 Nausea with vomiting, unspecified: Secondary | ICD-10-CM | POA: Insufficient documentation

## 2013-02-14 DIAGNOSIS — Z79899 Other long term (current) drug therapy: Secondary | ICD-10-CM | POA: Insufficient documentation

## 2013-02-14 LAB — COMPREHENSIVE METABOLIC PANEL
ALT: 9 U/L (ref 0–35)
AST: 12 U/L (ref 0–37)
Albumin: 4.1 g/dL (ref 3.5–5.2)
Alkaline Phosphatase: 43 U/L (ref 39–117)
BUN: 9 mg/dL (ref 6–23)
CO2: 26 mEq/L (ref 19–32)
Calcium: 8.9 mg/dL (ref 8.4–10.5)
Chloride: 100 mEq/L (ref 96–112)
Creatinine, Ser: 0.63 mg/dL (ref 0.50–1.10)
GFR calc Af Amer: >90 mL/min (ref >90)
GFR calc non Af Amer: >90 mL/min (ref >90)
Glucose, Bld: 87 mg/dL (ref 70–99)
Potassium: 3.6 mEq/L — ABNORMAL LOW (ref 3.7–5.3)
Sodium: 139 mEq/L (ref 137–147)
Total Bilirubin: 0.4 mg/dL (ref 0.3–1.2)
Total Protein: 8 g/dL (ref 6.0–8.3)

## 2013-02-14 LAB — URINE MICROSCOPIC-ADD ON

## 2013-02-14 LAB — URINALYSIS, ROUTINE W REFLEX MICROSCOPIC
Bilirubin Urine: NEGATIVE
Glucose, UA: NEGATIVE mg/dL
Hgb urine dipstick: NEGATIVE
Ketones, ur: NEGATIVE mg/dL
Nitrite: NEGATIVE
Protein, ur: NEGATIVE mg/dL
Specific Gravity, Urine: 1.017 (ref 1.005–1.030)
Urobilinogen, UA: 1 mg/dL (ref 0.0–1.0)
pH: 6 (ref 5.0–8.0)

## 2013-02-14 LAB — CBC WITH DIFFERENTIAL/PLATELET
Basophils Absolute: 0 10*3/uL (ref 0.0–0.1)
Basophils Relative: 0 % (ref 0–1)
Eosinophils Absolute: 0 10*3/uL (ref 0.0–0.7)
Eosinophils Relative: 1 % (ref 0–5)
HCT: 35.8 % — ABNORMAL LOW (ref 36.0–46.0)
Hemoglobin: 12 g/dL (ref 12.0–15.0)
Lymphocytes Relative: 32 % (ref 12–46)
Lymphs Abs: 1 10*3/uL (ref 0.7–4.0)
MCH: 27.6 pg (ref 26.0–34.0)
MCHC: 33.5 g/dL (ref 30.0–36.0)
MCV: 82.5 fL (ref 78.0–100.0)
Monocytes Absolute: 0.3 10*3/uL (ref 0.1–1.0)
Monocytes Relative: 10 % (ref 3–12)
Neutro Abs: 1.8 10*3/uL (ref 1.7–7.7)
Neutrophils Relative %: 57 % (ref 43–77)
Platelets: 259 10*3/uL (ref 150–400)
RBC: 4.34 MIL/uL (ref 3.87–5.11)
RDW: 13.2 % (ref 11.5–15.5)
WBC: 3.1 10*3/uL — ABNORMAL LOW (ref 4.0–10.5)

## 2013-02-14 LAB — POCT PREGNANCY, URINE: Preg Test, Ur: NEGATIVE

## 2013-02-14 NOTE — ED Notes (Signed)
Patient called X 2, no answer

## 2013-02-14 NOTE — ED Notes (Signed)
Pt. reports nausea / vomitting with chills and generalized abdominal cramping/pain onset last night .

## 2013-02-15 ENCOUNTER — Encounter (HOSPITAL_COMMUNITY): Payer: Self-pay | Admitting: Emergency Medicine

## 2013-02-15 ENCOUNTER — Emergency Department (INDEPENDENT_AMBULATORY_CARE_PROVIDER_SITE_OTHER)
Admission: EM | Admit: 2013-02-15 | Discharge: 2013-02-15 | Disposition: A | Discharge status: Home / Self Care | Payer: 59 | Source: Home / Self Care

## 2013-02-15 DIAGNOSIS — A088 Other specified intestinal infections: Secondary | ICD-10-CM

## 2013-02-15 DIAGNOSIS — A084 Viral intestinal infection, unspecified: Secondary | ICD-10-CM

## 2013-02-15 LAB — URINE CULTURE
Colony Count: NO GROWTH
Culture: NO GROWTH

## 2013-02-15 MED ORDER — ONDANSETRON HCL 4 MG PO TABS: 4.0000 mg | ORAL_TABLET | Freq: Four times a day (QID) | ORAL | Status: DC

## 2013-02-15 MED ORDER — ONDANSETRON 4 MG PO TBDP
4.0000 mg | ORAL_TABLET | Freq: Once | ORAL | Status: AC
  Administered 2013-02-15: 4 mg via ORAL

## 2013-02-15 MED ORDER — ONDANSETRON 4 MG PO TBDP
ORAL_TABLET | ORAL | Status: AC
  Filled 2013-02-15: qty 1

## 2013-02-15 NOTE — ED Provider Notes (Signed)
Medical screening examination/treatment/procedure(s) were performed by resident physician or non-physician practitioner and as supervising physician I was immediately available for consultation/collaboration.   Dwayne Bulkley DOUGLAS MD.   Yohana Bartha D Latoyia Tecson, MD 02/15/13 1410 

## 2013-02-15 NOTE — Discharge Instructions (Signed)
Viral Gastroenteritis °Viral gastroenteritis is also known as stomach flu. This condition affects the stomach and intestinal tract. It can cause sudden diarrhea and vomiting. The illness typically lasts 3 to 8 days. Most people develop an immune response that eventually gets rid of the virus. While this natural response develops, the virus can make you quite ill. °CAUSES  °Many different viruses can cause gastroenteritis, such as rotavirus or noroviruses. You can catch one of these viruses by consuming contaminated food or water. You may also catch a virus by sharing utensils or other personal items with an infected person or by touching a contaminated surface. °SYMPTOMS  °The most common symptoms are diarrhea and vomiting. These problems can cause a severe loss of body fluids (dehydration) and a body salt (electrolyte) imbalance. Other symptoms may include: °· Fever. °· Headache. °· Fatigue. °· Abdominal pain. °DIAGNOSIS  °Your caregiver can usually diagnose viral gastroenteritis based on your symptoms and a physical exam. A stool sample may also be taken to test for the presence of viruses or other infections. °TREATMENT  °This illness typically goes away on its own. Treatments are aimed at rehydration. The most serious cases of viral gastroenteritis involve vomiting so severely that you are not able to keep fluids down. In these cases, fluids must be given through an intravenous line (IV). °HOME CARE INSTRUCTIONS  °· Drink enough fluids to keep your urine clear or pale yellow. Drink small amounts of fluids frequently and increase the amounts as tolerated. °· Ask your caregiver for specific rehydration instructions. °· Avoid: °· Foods high in sugar. °· Alcohol. °· Carbonated drinks. °· Tobacco. °· Juice. °· Caffeine drinks. °· Extremely hot or cold fluids. °· Fatty, greasy foods. °· Too much intake of anything at one time. °· Dairy products until 24 to 48 hours after diarrhea stops. °· You may consume probiotics.  Probiotics are active cultures of beneficial bacteria. They may lessen the amount and number of diarrheal stools in adults. Probiotics can be found in yogurt with active cultures and in supplements. °· Wash your hands well to avoid spreading the virus. °· Only take over-the-counter or prescription medicines for pain, discomfort, or fever as directed by your caregiver. Do not give aspirin to children. Antidiarrheal medicines are not recommended. °· Ask your caregiver if you should continue to take your regular prescribed and over-the-counter medicines. °· Keep all follow-up appointments as directed by your caregiver. °SEEK IMMEDIATE MEDICAL CARE IF:  °· You are unable to keep fluids down. °· You do not urinate at least once every 6 to 8 hours. °· You develop shortness of breath. °· You notice blood in your stool or vomit. This may look like coffee grounds. °· You have abdominal pain that increases or is concentrated in one small area (localized). °· You have persistent vomiting or diarrhea. °· You have a fever. °· The patient is a child younger than 3 months, and he or she has a fever. °· The patient is a child older than 3 months, and he or she has a fever and persistent symptoms. °· The patient is a child older than 3 months, and he or she has a fever and symptoms suddenly get worse. °· The patient is a baby, and he or she has no tears when crying. °MAKE SURE YOU:  °· Understand these instructions. °· Will watch your condition. °· Will get help right away if you are not doing well or get worse. °Document Released: 01/13/2005 Document Revised: 04/07/2011 Document Reviewed: 10/30/2010 °  ExitCare Patient Information 2014 [place], [place].  Gastritis, Adult Gastritis is soreness and swelling (inflammation) of the lining of the stomach. Gastritis can develop as a sudden onset (acute) or long-term (chronic) condition. If gastritis is not treated, it can lead to stomach bleeding and ulcers. CAUSES  Gastritis occurs  when the stomach lining is weak or damaged. Digestive juices from the stomach then inflame the weakened stomach lining. The stomach lining may be weak or damaged due to viral or bacterial infections. One common bacterial infection is the Helicobacter pylori infection. Gastritis can also result from excessive alcohol consumption, taking certain medicines, or having too much acid in the stomach.  SYMPTOMS  In some cases, there are no symptoms. When symptoms are present, they may include:  Pain or a burning sensation in the upper abdomen.  Nausea.  Vomiting.  An uncomfortable feeling of fullness after eating. DIAGNOSIS  Your caregiver may suspect you have gastritis based on your symptoms and a physical exam. To determine the cause of your gastritis, your caregiver may perform the following:  Blood or stool tests to check for the H pylori bacterium.  Gastroscopy. A thin, flexible tube (endoscope) is passed down the esophagus and into the stomach. The endoscope has a light and camera on the end. Your caregiver uses the endoscope to view the inside of the stomach.  Taking a tissue sample (biopsy) from the stomach to examine under a microscope. TREATMENT  Depending on the cause of your gastritis, medicines may be prescribed. If you have a bacterial infection, such as an H pylori infection, antibiotics may be given. If your gastritis is caused by too much acid in the stomach, H2 blockers or antacids may be given. Your caregiver may recommend that you stop taking aspirin, ibuprofen, or other nonsteroidal anti-inflammatory drugs (NSAIDs). HOME CARE INSTRUCTIONS  Only take over-the-counter or prescription medicines as directed by your caregiver.  If you were given antibiotic medicines, take them as directed. Finish them even if you start to feel better.  Drink enough fluids to keep your urine clear or pale yellow.  Avoid foods and drinks that make your symptoms worse, such as:  Caffeine or  alcoholic drinks.  Chocolate.  Peppermint or mint flavorings.  Garlic and onions.  Spicy foods.  Citrus fruits, such as oranges, lemons, or limes.  Tomato-based foods such as sauce, chili, salsa, and pizza.  Fried and fatty foods.  Eat small, frequent meals instead of large meals. SEEK IMMEDIATE MEDICAL CARE IF:   You have black or dark red stools.  You vomit blood or material that looks like coffee grounds.  You are unable to keep fluids down.  Your abdominal pain gets worse.  You have a fever.  You do not feel better after 1 week.  You have any other questions or concerns. MAKE SURE YOU:  Understand these instructions.  Will watch your condition.  Will get help right away if you are not doing well or get worse. Document Released: 01/07/2001 Document Revised: 07/15/2011 Document Reviewed: 02/26/2011 [facility] Patient Information 2014 [place], [place].

## 2013-02-15 NOTE — ED Provider Notes (Signed)
CSN: [id number]     Arrival date & time 02/15/13  1133 History   First MD Initiated Contact with Patient 02/15/13 1255     Chief Complaint  Patient presents with  . Emesis  . Abdominal Pain   (Consider location/radiation/quality/duration/timing/severity/associated sxs/prior Treatment) HPI Comments: 23 year old female complaining of vomiting for 2 days. This is associated with chills, sweats and abdominal cramping which migrates from one side to the other. She denies diarrhea or fever. She states she is unable to hold down meds clear fluids.   Past Medical History  Diagnosis Date  . Asthma    Past Surgical History  Procedure Laterality Date  . Tonsillectomy     History reviewed. No pertinent family history. History  Substance Use Topics  . Smoking status: Never Smoker   . Smokeless tobacco: Not on file  . Alcohol Use: No   OB History   Grav Para Term Preterm Abortions TAB SAB Ect Mult Living                 Review of Systems  Constitutional: Positive for activity change and appetite change. Negative for fever.  HENT: Negative.   Respiratory: Negative.   Cardiovascular: Negative.   Gastrointestinal: Positive for nausea, vomiting and abdominal pain. Negative for diarrhea and blood in stool.  Genitourinary: Negative.   Skin: Negative.   Neurological: Negative.     Allergies  Review of patient's allergies indicates no known allergies.  Home Medications   Current Outpatient Rx  Name  Route  Sig  Dispense  Refill  . albuterol (PROVENTIL HFA;VENTOLIN HFA) 108 (90 BASE) MCG/ACT inhaler   Inhalation   Inhale 2 puffs into the lungs every 6 (six) hours as needed for shortness of breath.          . clobetasol ointment (TEMOVATE) 0.05 %   Topical   Apply 1 application topically as needed (to hands after washing or if inflamed).         . ondansetron (ZOFRAN) 4 MG tablet   Oral   Take 1 tablet (4 mg total) by mouth every 6 (six) hours.   12 tablet   0    BP  105/73  Pulse 69  Temp(Src) 98.3 F (36.8 C) (Oral)  Resp 18  SpO2 98%  LMP 01/14/2013 Physical Exam  Nursing note and vitals reviewed. Constitutional: She is oriented to person, place, and time. She appears well-developed and well-nourished. No distress.  HENT:  Mouth/Throat: Oropharynx is clear and moist. No oropharyngeal exudate.  Eyes: Conjunctivae and EOM are normal.  Neck: Normal range of motion. Neck supple.  Cardiovascular: Normal rate, regular rhythm and normal heart sounds.   Pulmonary/Chest: Effort normal and breath sounds normal. No respiratory distress.  Abdominal: Soft. Bowel sounds are normal. She exhibits no distension and no mass. There is tenderness. There is no rebound and no guarding.  Generalized tenderness primarily in the epigastrium and right upper quadrant. No pelvic pain.  Musculoskeletal: She exhibits no edema and no tenderness.  Lymphadenopathy:    She has no cervical adenopathy.  Neurological: She is alert and oriented to person, place, and time. She exhibits normal muscle tone.  Skin: Skin is warm and dry. No rash noted. No erythema.    ED Course  Procedures (including critical care time) Labs Review Labs Reviewed - No data to display Imaging Review No results found.    MDM   1. Viral gastroenteritis      Instructions for diet with nausea and vomiting. Clear  liquids in small frequent volumes. Zofran as directed for nausea vomiting Rest Washing hands and his infection instructions. During worsening symptoms or problems may return or get to the emergency department half fever and severe abdominal pain.     Hayden Rasmussen, NP 02/15/13 1317

## 2013-02-15 NOTE — ED Notes (Signed)
C/o vomiting with abdominal pain that started 2 days ago. Pain is 6/10. Stated that she is unable to keep anything down, has feelings of being hot and cold at times. Denies any other symptoms. Written by: Marga MelnickQuaNeisha Jones, SMA

## 2013-02-26 ENCOUNTER — Encounter (HOSPITAL_COMMUNITY): Payer: Self-pay | Admitting: Emergency Medicine

## 2013-02-26 ENCOUNTER — Emergency Department (HOSPITAL_COMMUNITY)
Admission: EM | Admit: 2013-02-26 | Discharge: 2013-02-26 | Disposition: A | Discharge status: Home / Self Care | Payer: 59 | Attending: Emergency Medicine | Admitting: Emergency Medicine

## 2013-02-26 DIAGNOSIS — R51 Headache: Secondary | ICD-10-CM | POA: Insufficient documentation

## 2013-02-26 DIAGNOSIS — Z79899 Other long term (current) drug therapy: Secondary | ICD-10-CM | POA: Insufficient documentation

## 2013-02-26 DIAGNOSIS — R55 Syncope and collapse: Secondary | ICD-10-CM | POA: Insufficient documentation

## 2013-02-26 DIAGNOSIS — J45909 Unspecified asthma, uncomplicated: Secondary | ICD-10-CM | POA: Insufficient documentation

## 2013-02-26 DIAGNOSIS — R112 Nausea with vomiting, unspecified: Secondary | ICD-10-CM

## 2013-02-26 LAB — BASIC METABOLIC PANEL
BUN: 11 mg/dL (ref 6–23)
CHLORIDE: 100 meq/L (ref 96–112)
CO2: 23 meq/L (ref 19–32)
Calcium: 9.1 mg/dL (ref 8.4–10.5)
Creatinine, Ser: 0.69 mg/dL (ref 0.50–1.10)
GFR calc Af Amer: >90 mL/min (ref >90)
GFR calc non Af Amer: >90 mL/min (ref >90)
Glucose, Bld: 84 mg/dL (ref 70–99)
POTASSIUM: 4.1 meq/L (ref 3.7–5.3)
SODIUM: 136 meq/L — AB (ref 137–147)

## 2013-02-26 LAB — URINALYSIS, ROUTINE W REFLEX MICROSCOPIC
BILIRUBIN URINE: NEGATIVE
GLUCOSE, UA: NEGATIVE mg/dL
HGB URINE DIPSTICK: NEGATIVE
Ketones, ur: NEGATIVE mg/dL
Nitrite: NEGATIVE
PH: 6 (ref 5.0–8.0)
Protein, ur: NEGATIVE mg/dL
Specific Gravity, Urine: 1.017 (ref 1.005–1.030)
UROBILINOGEN UA: 0.2 mg/dL (ref 0.0–1.0)

## 2013-02-26 LAB — CBC
HCT: 35.9 % — ABNORMAL LOW (ref 36.0–46.0)
HEMOGLOBIN: 12 g/dL (ref 12.0–15.0)
MCH: 27.3 pg (ref 26.0–34.0)
MCHC: 33.4 g/dL (ref 30.0–36.0)
MCV: 81.8 fL (ref 78.0–100.0)
Platelets: 320 10*3/uL (ref 150–400)
RBC: 4.39 MIL/uL (ref 3.87–5.11)
RDW: 13.5 % (ref 11.5–15.5)
WBC: 6 10*3/uL (ref 4.0–10.5)

## 2013-02-26 LAB — URINE MICROSCOPIC-ADD ON

## 2013-02-26 LAB — HCG, SERUM, QUALITATIVE: PREG SERUM: NEGATIVE

## 2013-02-26 MED ORDER — ONDANSETRON HCL 4 MG/2ML IJ SOLN
4.0000 mg | Freq: Once | INTRAMUSCULAR | Status: AC
  Administered 2013-02-26: 4 mg via INTRAVENOUS
  Filled 2013-02-26: qty 2

## 2013-02-26 MED ORDER — ONDANSETRON 8 MG PO TBDP
8.0000 mg | ORAL_TABLET | Freq: Three times a day (TID) | ORAL | Status: DC | PRN
Start: 2013-02-26 — End: 2014-03-10

## 2013-02-26 MED ORDER — SODIUM CHLORIDE 0.9 % IV BOLUS (SEPSIS)
1000.0000 mL | Freq: Once | INTRAVENOUS | Status: AC
  Administered 2013-02-26: 1000 mL via INTRAVENOUS

## 2013-02-26 MED ORDER — ACETAMINOPHEN 325 MG PO TABS
650.0000 mg | ORAL_TABLET | Freq: Once | ORAL | Status: AC
  Administered 2013-02-26: 650 mg via ORAL
  Filled 2013-02-26: qty 2

## 2013-02-26 NOTE — ED Provider Notes (Signed)
CSN: [id number]     Arrival date & time 02/26/13  1426 History   First MD Initiated Contact with Patient 02/26/13 1506     Chief Complaint  Patient presents with  . Loss of Consciousness    HPI Patient presents to the emergency department after a syncopal episode.  She states she was at work and was feeling fine when she suddenly became nauseated and vomited twice.  No hematemesis described.  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  She did not strike her head.  She states that when she awoke she is having a mild headache.  No seizure activity was noted.  She denies preceding chest pain or palpitations.  She has had no abdominal pain.  She states she is late for her most recent menstrual cycle.  She denies vaginal bleeding at this time.  No lower abdominal discomfort.  No dysuria or urinary frequency.   Past Medical History  Diagnosis Date  . Asthma    Past Surgical History  Procedure Laterality Date  . Tonsillectomy     History reviewed. No pertinent family history. History  Substance Use Topics  . Smoking status: Never Smoker   . Smokeless tobacco: Not on file  . Alcohol Use: No   OB History   Grav Para Term Preterm Abortions TAB SAB Ect Mult Living                 Review of Systems  All other systems reviewed and are negative.    Allergies  Review of patient's allergies indicates no known allergies.  Home Medications   Current Outpatient Rx  Name  Route  Sig  Dispense  Refill  . albuterol (PROVENTIL HFA;VENTOLIN HFA) 108 (90 BASE) MCG/ACT inhaler   Inhalation   Inhale 2 puffs into the lungs every 6 (six) hours as needed for shortness of breath.          . clobetasol ointment (TEMOVATE) 0.05 %   Topical   Apply 1 application topically as needed (to hands after washing or if inflamed).         . ondansetron (ZOFRAN) 4 MG tablet   Oral   Take 1 tablet (4 mg total) by mouth every 6 (six) hours.   12 tablet   0   . ondansetron (ZOFRAN ODT)  8 MG disintegrating tablet   Oral   Take 1 tablet (8 mg total) by mouth every 8 (eight) hours as needed for nausea or vomiting.   10 tablet   0    BP 129/82  Pulse 80  Temp(Src) 97.8 F (36.6 C) (Oral)  Resp 13  SpO2 99%  LMP 01/14/2013 Physical Exam  Nursing note and vitals reviewed. Constitutional: She is oriented to person, place, and time. She appears well-developed and well-nourished. No distress.  HENT:  Head: Normocephalic and atraumatic.  Eyes: EOM are normal.  Neck: Normal range of motion.  Cardiovascular: Normal rate, regular rhythm and normal heart sounds.   Pulmonary/Chest: Effort normal and breath sounds normal.  Abdominal: Soft. She exhibits no distension. There is no tenderness.  Musculoskeletal: Normal range of motion.  Neurological: She is alert and oriented to person, place, and time.  Skin: Skin is warm and dry.  Psychiatric: She has a normal mood and affect. Judgment normal.    ED Course  Procedures (including critical care time) Labs Review Labs Reviewed  CBC - Abnormal; Notable for the following:    HCT 35.9 (*)    All  other components within normal limits  BASIC METABOLIC PANEL - Abnormal; Notable for the following:    Sodium 136 (*)    All other components within normal limits  URINALYSIS, ROUTINE W REFLEX MICROSCOPIC - Abnormal; Notable for the following:    Leukocytes, UA MODERATE (*)    All other components within normal limits  HCG, SERUM, QUALITATIVE  URINE MICROSCOPIC-ADD ON   Imaging Review No results found.  EKG Interpretation    Date/Time:  Saturday February 26 2013 15:27:20 EST Ventricular Rate:  88 PR Interval:  171 QRS Duration: 87 QT Interval:  338 QTC Calculation: 409 R Axis:   63 Text Interpretation:  Sinus rhythm No significant change was found Confirmed by Mikaili Flippin  MD, Emelly Wurtz ([id number]) on 02/26/2013 4:59:46 PM            MDM   1. Syncope   2. Nausea & vomiting    6:18 PM Ambulated without syncope.  Vital signs  remained stable.  Labs and urine normal discharged in good condition.. Doubt cardiogenic syncope.    Lyanne Co, MD 02/26/13 [phone]

## 2013-02-26 NOTE — ED Notes (Signed)
To ED via [facility] from work== "passed out" for approx 1 min per bystanders. On arrival a/ox 4.

## 2013-02-26 NOTE — ED Notes (Signed)
When standing, prior to ambulation, patient stated she felt dizzy. Her o2 levels were 84% saturation. During ambulation her oxygen saturation averaged at 84%. When standing still and inhaling, the oxygen levels rose back to 100%. Patient stated she felt a little lightheaded during ambulation. Patient also stated that stopping for brief periods helped.

## 2013-02-26 NOTE — ED Notes (Signed)
Pt vomiting at this time 

## 2014-01-17 ENCOUNTER — Other Ambulatory Visit (HOSPITAL_COMMUNITY): Payer: Self-pay | Admitting: Obstetrics

## 2014-01-24 ENCOUNTER — Ambulatory Visit (HOSPITAL_COMMUNITY)
Admission: RE | Admit: 2014-01-24 | Discharge: 2014-01-24 | Disposition: A | Discharge status: Home / Self Care | Payer: 59 | Source: Ambulatory Visit | Attending: Obstetrics | Admitting: Obstetrics

## 2014-01-24 DIAGNOSIS — R938 Abnormal findings on diagnostic imaging of other specified body structures: Secondary | ICD-10-CM | POA: Insufficient documentation

## 2014-01-24 DIAGNOSIS — Z3A08 8 weeks gestation of pregnancy: Secondary | ICD-10-CM | POA: Diagnosis not present

## 2014-01-24 DIAGNOSIS — O3661X0 Maternal care for excessive fetal growth, first trimester, not applicable or unspecified: Secondary | ICD-10-CM | POA: Diagnosis not present

## 2014-01-25 LAB — OB RESULTS CONSOLE ABO/RH: RH Type: POSITIVE

## 2014-01-25 LAB — OB RESULTS CONSOLE GC/CHLAMYDIA
CHLAMYDIA, DNA PROBE: NEGATIVE
GC PROBE AMP, GENITAL: NEGATIVE

## 2014-01-25 LAB — OB RESULTS CONSOLE RUBELLA ANTIBODY, IGM: RUBELLA: IMMUNE

## 2014-01-25 LAB — OB RESULTS CONSOLE ANTIBODY SCREEN: Antibody Screen: NEGATIVE

## 2014-01-25 LAB — OB RESULTS CONSOLE RPR: RPR: NONREACTIVE

## 2014-01-25 LAB — OB RESULTS CONSOLE HIV ANTIBODY (ROUTINE TESTING): HIV: NONREACTIVE

## 2014-01-25 LAB — OB RESULTS CONSOLE HEPATITIS B SURFACE ANTIGEN: Hepatitis B Surface Ag: NEGATIVE

## 2014-01-27 NOTE — L&D Delivery Note (Signed)
    Delivery Note At 3:34 PM a viable and healthy female was delivered via Vaginal, Spontaneous Delivery (Presentation: Right Occiput Anterior) with compound presentation of right hand .  APGAR: 8, 9; weight pending.   Placenta status: Intact, Spontaneous.  Cord: 3 vessels with the following complications: None.  Cord pH: NA  Anesthesia: None  Episiotomy: None Lacerations: None Suture Repair: none Est. Blood Loss (mL): 150  Mom to postpartum.  Baby to Couplet care / Skin to Skin.  De Hollingshead 09/05/2014, 4:01 PM

## 2014-02-16 ENCOUNTER — Encounter (HOSPITAL_COMMUNITY): Payer: Self-pay

## 2014-02-16 ENCOUNTER — Emergency Department (HOSPITAL_COMMUNITY)
Admission: EM | Admit: 2014-02-16 | Discharge: 2014-02-16 | Disposition: A | Discharge status: Home / Self Care | Payer: 59 | Attending: Emergency Medicine | Admitting: Emergency Medicine

## 2014-02-16 DIAGNOSIS — R040 Epistaxis: Secondary | ICD-10-CM | POA: Diagnosis not present

## 2014-02-16 DIAGNOSIS — J45909 Unspecified asthma, uncomplicated: Secondary | ICD-10-CM | POA: Diagnosis not present

## 2014-02-16 DIAGNOSIS — M5432 Sciatica, left side: Secondary | ICD-10-CM | POA: Diagnosis not present

## 2014-02-16 DIAGNOSIS — Z3A13 13 weeks gestation of pregnancy: Secondary | ICD-10-CM | POA: Diagnosis not present

## 2014-02-16 DIAGNOSIS — O9989 Other specified diseases and conditions complicating pregnancy, childbirth and the puerperium: Secondary | ICD-10-CM | POA: Insufficient documentation

## 2014-02-16 DIAGNOSIS — D649 Anemia, unspecified: Secondary | ICD-10-CM | POA: Insufficient documentation

## 2014-02-16 HISTORY — DX: Anemia, unspecified: D64.9

## 2014-02-16 LAB — CBC WITH DIFFERENTIAL/PLATELET
Basophils Absolute: 0 10*3/uL (ref 0.0–0.1)
Basophils Relative: 0 % (ref 0–1)
Eosinophils Absolute: 0.1 10*3/uL (ref 0.0–0.7)
Eosinophils Relative: 1 % (ref 0–5)
HCT: 29 % — ABNORMAL LOW (ref 36.0–46.0)
Hemoglobin: 9.8 g/dL — ABNORMAL LOW (ref 12.0–15.0)
LYMPHS ABS: 1.6 10*3/uL (ref 0.7–4.0)
Lymphocytes Relative: 29 % (ref 12–46)
MCH: 27.5 pg (ref 26.0–34.0)
MCHC: 33.8 g/dL (ref 30.0–36.0)
MCV: 81.2 fL (ref 78.0–100.0)
MONO ABS: 0.5 10*3/uL (ref 0.1–1.0)
MONOS PCT: 8 % (ref 3–12)
Neutro Abs: 3.5 10*3/uL (ref 1.7–7.7)
Neutrophils Relative %: 62 % (ref 43–77)
Platelets: 297 10*3/uL (ref 150–400)
RBC: 3.57 MIL/uL — ABNORMAL LOW (ref 3.87–5.11)
RDW: 13.6 % (ref 11.5–15.5)
WBC: 5.7 10*3/uL (ref 4.0–10.5)

## 2014-02-16 LAB — I-STAT CHEM 8, ED
BUN: 7 mg/dL (ref 6–23)
CALCIUM ION: 1.17 mmol/L (ref 1.12–1.23)
CREATININE: 0.4 mg/dL — AB (ref 0.50–1.10)
Chloride: 102 mEq/L (ref 96–112)
Glucose, Bld: 77 mg/dL (ref 70–99)
HEMATOCRIT: 33 % — AB (ref 36.0–46.0)
Hemoglobin: 11.2 g/dL — ABNORMAL LOW (ref 12.0–15.0)
Potassium: 3.6 mmol/L (ref 3.5–5.1)
SODIUM: 136 mmol/L (ref 135–145)
TCO2: 19 mmol/L (ref 0–100)

## 2014-02-16 NOTE — ED Notes (Signed)
Pt c/o intermittent nose bleeds x 13 weeks and intermittent BLE numbness x 3-4 days.  Denies pain.  Pt reports being [redacted] weeks pregnant.  Sts nose bleeds have increased x 2 days.  Denies ob/gyn complaints.  Pt ambulated to room w/o difficulty.  No current bleeding.

## 2014-02-16 NOTE — ED Provider Notes (Signed)
CSN: [id number]     Arrival date & time 02/16/14  1717 History   First MD Initiated Contact with Patient 02/16/14 1731     Chief Complaint  Patient presents with  . Epistaxis  . Numbness     (Consider location/radiation/quality/duration/timing/severity/associated sxs/prior Treatment) Patient is a 24 y.o. female presenting with nosebleeds. The history is provided by the patient.  Epistaxis Location:  L nare Severity:  Mild Timing:  Sporadic Progression:  Resolved Chronicity:  New Context comment:  Since being pregnant she has had intermittent nosebleeds but worse over the last 2 days Relieved by:  Applying pressure Worsened by:  Nothing tried Ineffective treatments:  None tried Associated symptoms: no fever and no headaches   Associated symptoms comment:  Dizziness with standing only.  Also over the last 3-4 days pt has had intermittent numbness and pain that shoot down the left leg and then for a short time in the right foot today.  Only last 5-10 min before resolving.  No weakness or back pain.  No abd pain, dysuria or vaginal d/c. Risk factors comment:  [redacted] weeks pregnant.  significant n/v for >4 weeks   Past Medical History  Diagnosis Date  . Asthma   . Anemia    Past Surgical History  Procedure Laterality Date  . Tonsillectomy     History reviewed. No pertinent family history. History  Substance Use Topics  . Smoking status: Never Smoker   . Smokeless tobacco: Not on file  . Alcohol Use: No   OB History    Gravida Para Term Preterm AB TAB SAB Ectopic Multiple Living   1              Review of Systems  Constitutional: Negative for fever.  HENT: Positive for nosebleeds.   Neurological: Negative for headaches.  All other systems reviewed and are negative.     Allergies  Review of patient's allergies indicates no known allergies.  Home Medications   Prior to Admission medications   Medication Sig Start Date End Date Taking? Authorizing Provider  Prenatal  Vit-Fe Fumarate-FA (PRENATAL MULTIVITAMIN) TABS tablet Take 1 tablet by mouth daily at 12 noon.   Yes Historical Provider, MD  albuterol (PROVENTIL HFA;VENTOLIN HFA) 108 (90 BASE) MCG/ACT inhaler Inhale 2 puffs into the lungs every 6 (six) hours as needed for shortness of breath.     Historical Provider, MD  clobetasol ointment (TEMOVATE) 0.05 % Apply 1 application topically as needed (to hands after washing or if inflamed).    Historical Provider, MD  ondansetron (ZOFRAN ODT) 8 MG disintegrating tablet Take 1 tablet (8 mg total) by mouth every 8 (eight) hours as needed for nausea or vomiting. 02/26/13   Lyanne CoKevin M Campos, MD  ondansetron (ZOFRAN) 4 MG tablet Take 1 tablet (4 mg total) by mouth every 6 (six) hours. 02/15/13   Hayden Rasmussenavid Mabe, NP   BP 132/82 mmHg  Pulse 85  Temp(Src) 98.1 F (36.7 C) (Oral)  Resp 16  SpO2 99% Physical Exam  Constitutional: She is oriented to person, place, and time. She appears well-developed and well-nourished. No distress.  HENT:  Head: Normocephalic and atraumatic.  Right Ear: Tympanic membrane and ear canal normal.  Left Ear: Tympanic membrane and ear canal normal.  Nose: Mucosal edema present. No epistaxis.  Mouth/Throat: Oropharynx is clear and moist.  Eyes: Conjunctivae and EOM are normal. Pupils are equal, round, and reactive to light.  Neck: Normal range of motion. Neck supple.  Cardiovascular: Normal rate, regular rhythm  and intact distal pulses.   No murmur heard. Pulmonary/Chest: Effort normal and breath sounds normal. No respiratory distress. She has no wheezes. She has no rales.  Abdominal: Soft. She exhibits no distension. There is no tenderness. There is no rebound and no guarding.  Gravid just above the pubic bone  Musculoskeletal: Normal range of motion. She exhibits no edema or tenderness.       Lumbar back: Normal.  Neurological: She is alert and oriented to person, place, and time. She has normal strength. No sensory deficit.  Reflex Scores:       Patellar reflexes are 2+ on the right side and 2+ on the left side.      Achilles reflexes are 2+ on the right side and 2+ on the left side. Skin: Skin is warm and dry. No rash noted. No erythema.  Psychiatric: She has a normal mood and affect. Her behavior is normal.  Nursing note and vitals reviewed.   ED Course  Procedures (including critical care time) Labs Review Labs Reviewed  CBC WITH DIFFERENTIAL - Abnormal; Notable for the following:    RBC 3.57 (*)    Hemoglobin 9.8 (*)    HCT 29.0 (*)    All other components within normal limits  I-STAT CHEM 8, ED - Abnormal; Notable for the following:    Creatinine, Ser 0.40 (*)    Hemoglobin 11.2 (*)    HCT 33.0 (*)    All other components within normal limits    Imaging Review No results found.   EKG Interpretation None      MDM   Final diagnoses:  Epistaxis  Sciatica, left    Patient who is [redacted] weeks pregnant confirmed by ultrasound at her OB/GYN who presents today because of intermittent nosebleeds that a bit more cephalad last 2 days and intermittent lower extremity pain and numbness that lasts 5-10 minutes and has been ongoing for 3-4 days. She walks and stands for long periods of time and notices it more during those times. She denies any weakness of her legs. She also admits to vomiting for 4 more times daily for over the last 4 weeks. She denies any urinary symptoms, abdominal pain, back pain.  Low suspicion for any issue with her pregnancy at this time and feel that patient's symptoms are most likely related to things that happened during normal pregnancy.  Patient is able to walk has normal reflexes and strength in her lower extremities and normal sensation at this time. Because of the excessive vomiting we'll check a chem 8 to ensure normal potassium as well as a CBC because she does have a history of anemia. Patient is otherwise well appearing and hemodynamically stable  6:51 PM Potassium normal and Hb today is  9.8.  Total patient results of hemoglobin and that it would need to be monitored by OB/GYN. Also recommended that she should take iron  Gwyneth Sprout, MD 02/16/14 [phone]

## 2014-03-10 ENCOUNTER — Encounter (HOSPITAL_COMMUNITY): Payer: Self-pay | Admitting: *Deleted

## 2014-03-10 ENCOUNTER — Inpatient Hospital Stay (HOSPITAL_COMMUNITY)
Admission: AD | Admit: 2014-03-10 | Discharge: 2014-03-10 | Disposition: A | Discharge status: Home / Self Care | Payer: 59 | Source: Ambulatory Visit | Attending: Obstetrics and Gynecology | Admitting: Obstetrics and Gynecology

## 2014-03-10 DIAGNOSIS — R1031 Right lower quadrant pain: Secondary | ICD-10-CM | POA: Insufficient documentation

## 2014-03-10 DIAGNOSIS — O23593 Infection of other part of genital tract in pregnancy, third trimester: Secondary | ICD-10-CM

## 2014-03-10 DIAGNOSIS — Z3A14 14 weeks gestation of pregnancy: Secondary | ICD-10-CM | POA: Diagnosis not present

## 2014-03-10 DIAGNOSIS — O23592 Infection of other part of genital tract in pregnancy, second trimester: Secondary | ICD-10-CM | POA: Insufficient documentation

## 2014-03-10 DIAGNOSIS — N760 Acute vaginitis: Secondary | ICD-10-CM | POA: Diagnosis not present

## 2014-03-10 DIAGNOSIS — B9689 Other specified bacterial agents as the cause of diseases classified elsewhere: Secondary | ICD-10-CM | POA: Diagnosis not present

## 2014-03-10 DIAGNOSIS — Z3A33 33 weeks gestation of pregnancy: Secondary | ICD-10-CM

## 2014-03-10 LAB — URINALYSIS, ROUTINE W REFLEX MICROSCOPIC
Bilirubin Urine: NEGATIVE
Glucose, UA: NEGATIVE mg/dL
Hgb urine dipstick: NEGATIVE
Ketones, ur: NEGATIVE mg/dL
Leukocytes, UA: NEGATIVE
Nitrite: NEGATIVE
PROTEIN: NEGATIVE mg/dL
SPECIFIC GRAVITY, URINE: 1.02 (ref 1.005–1.030)
Urobilinogen, UA: 0.2 mg/dL (ref 0.0–1.0)
pH: 8.5 — ABNORMAL HIGH (ref 5.0–8.0)

## 2014-03-10 LAB — WET PREP, GENITAL
TRICH WET PREP: NONE SEEN
YEAST WET PREP: NONE SEEN

## 2014-03-10 MED ORDER — METRONIDAZOLE 500 MG PO TABS: 500.0000 mg | ORAL_TABLET | Freq: Two times a day (BID) | ORAL | Status: DC

## 2014-03-10 NOTE — MAU Note (Signed)
Sudden onset of RLQ pain. Comes and goes sharp. No GI or GU complaints

## 2014-03-10 NOTE — MAU Note (Signed)
C/o intermittent sharp shooting, right lower abdominal pain that started this AM; G1; denies any vaginal discharge or bleeding;

## 2014-03-10 NOTE — Discharge Instructions (Signed)
Bacterial Vaginosis Bacterial vaginosis is a vaginal infection that occurs when the normal balance of bacteria in the vagina is disrupted. It results from an overgrowth of certain bacteria. This is the most common vaginal infection in women of childbearing age. Treatment is important to prevent complications, especially in pregnant women, as it can cause a premature delivery. CAUSES  Bacterial vaginosis is caused by an increase in harmful bacteria that are normally present in smaller amounts in the vagina. Several different kinds of bacteria can cause bacterial vaginosis. However, the reason that the condition develops is not fully understood. RISK FACTORS Certain activities or behaviors can put you at an increased risk of developing bacterial vaginosis, including:  Having a new sex partner or multiple sex partners.  Douching.  Using an intrauterine device (IUD) for contraception. Women do not get bacterial vaginosis from toilet seats, bedding, swimming pools, or contact with objects around them. SIGNS AND SYMPTOMS  Some women with bacterial vaginosis have no signs or symptoms. Common symptoms include:  Grey vaginal discharge.  A fishlike odor with discharge, especially after sexual intercourse.  Itching or burning of the vagina and vulva.  Burning or pain with urination. DIAGNOSIS  Your health care provider will take a medical history and examine the vagina for signs of bacterial vaginosis. A sample of vaginal fluid may be taken. Your health care provider will look at this sample under a microscope to check for bacteria and abnormal cells. A vaginal pH test may also be done.  TREATMENT  Bacterial vaginosis may be treated with antibiotic medicines. These may be given in the form of a pill or a vaginal cream. A second round of antibiotics may be prescribed if the condition comes back after treatment.  HOME CARE INSTRUCTIONS   Only take over-the-counter or prescription medicines as  directed by your health care provider.  If antibiotic medicine was prescribed, take it as directed. Make sure you finish it even if you start to feel better.  Do not have sex until treatment is completed.  Tell all sexual partners that you have a vaginal infection. They should see their health care provider and be treated if they have problems, such as a mild rash or itching.  Practice safe sex by using condoms and only having one sex partner. SEEK MEDICAL CARE IF:   Your symptoms are not improving after 3 days of treatment.  You have increased discharge or pain.  You have a fever. MAKE SURE YOU:   Understand these instructions.  Will watch your condition.  Will get help right away if you are not doing well or get worse. FOR MORE INFORMATION  Centers for Disease Control and Prevention, Division of STD Prevention: www.cdc.gov/std American Sexual Health Association (ASHA): www.ashastd.org  Document Released: 01/13/2005 Document Revised: 11/03/2012 Document Reviewed: 08/25/2012 [employer] Patient Information 2015 [employer]. This information is not intended to replace advice given to you by your health care provider. Make sure you discuss any questions you have with your health care provider.  

## 2014-03-10 NOTE — MAU Provider Note (Signed)
History     CSN: [id number]  Arrival date and time: 03/10/14 3086   First Provider Initiated Contact with Patient 03/10/14 [phone]      Chief Complaint  Patient presents with  . Abdominal Pain   HPI  [name] is a 24 y.o. G1P0 at [redacted]w[redacted]d who presents to MAU today with complaint of RLQ pain since this morning. She states pain is intermittent and she has no pain right now. She denies any change in pain with standing, change in positions or ambulation. She denies vaginal bleeding, LOF, UTI symptoms, fever or N/V/D or constipation. She states a new onset of a small amount of yellow-white discharge.   OB History    Gravida Para Term Preterm AB TAB SAB Ectopic Multiple Living   1               Past Medical History  Diagnosis Date  . Asthma   . Anemia     Past Surgical History  Procedure Laterality Date  . Tonsillectomy      Family History  Problem Relation Age of Onset  . Hypertension Mother   . Hypertension Maternal Grandmother   . Hypertension Maternal Grandfather   . Diabetes Paternal Grandmother     History  Substance Use Topics  . Smoking status: Never Smoker   . Smokeless tobacco: Not on file  . Alcohol Use: No    Allergies: No Known Allergies  Prescriptions prior to admission  Medication Sig Dispense Refill Last Dose  . acetaminophen (TYLENOL) 325 MG tablet Take 325 mg by mouth every 6 (six) hours as needed for moderate pain.   Past Week at Unknown time  . Prenatal Vit-Fe Fumarate-FA (PRENATAL MULTIVITAMIN) TABS tablet Take 1 tablet by mouth daily at 12 noon.   03/09/2014 at Unknown time  . promethazine (PHENERGAN) 25 MG tablet Take 25 mg by mouth every 6 (six) hours as needed for nausea or vomiting.   03/09/2014 at Unknown time  . albuterol (PROVENTIL HFA;VENTOLIN HFA) 108 (90 BASE) MCG/ACT inhaler Inhale 2 puffs into the lungs every 6 (six) hours as needed for shortness of breath.    prn  . clobetasol ointment (TEMOVATE) 0.05 % Apply 1 application  topically as needed (to hands after washing or if inflamed).   prn  . ondansetron (ZOFRAN ODT) 8 MG disintegrating tablet Take 1 tablet (8 mg total) by mouth every 8 (eight) hours as needed for nausea or vomiting. (Patient not taking: Reported on 02/16/2014) 10 tablet 0 Completed Course at Unknown time  . ondansetron (ZOFRAN) 4 MG tablet Take 1 tablet (4 mg total) by mouth every 6 (six) hours. (Patient not taking: Reported on 02/16/2014) 12 tablet 0 Completed Course at Unknown time    Review of Systems  Constitutional: Negative for fever and malaise/fatigue.  Gastrointestinal: Positive for abdominal pain. Negative for nausea, vomiting, diarrhea and constipation.  Genitourinary: Negative for dysuria, urgency and frequency.       Neg - vaginal bleeding, LOF + vaginal discharge   Physical Exam   Blood pressure 115/80, pulse 101, temperature 98.3 F (36.8 C), temperature source Oral, resp. rate 18, weight 136 lb (61.689 kg).  Physical Exam  Constitutional: She is oriented to person, place, and time. She appears well-developed and well-nourished. No distress.  HENT:  Head: Normocephalic.  Cardiovascular: Normal rate.   Respiratory: Effort normal.  GI: Soft. Bowel sounds are normal. She exhibits no distension and no mass. There is tenderness (mild tenderness to palpation of the  lower abdomen bilaterally). There is no rebound and no guarding.  Genitourinary: Uterus is enlarged (appropriate for GA). Uterus is not tender. Cervix exhibits no motion tenderness, no discharge and no friability. No bleeding in the vagina. Vaginal discharge (moderate amount of thin, white discharge noted) found.  Cervix: closed, thick  Neurological: She is alert and oriented to person, place, and time.  Skin: Skin is warm and dry. No erythema.  Psychiatric: She has a normal mood and affect.   Results for orders placed or performed during the hospital encounter of 03/10/14 (from the past 24 hour(s))  Urinalysis, Routine  w reflex microscopic     Status: Abnormal   Collection Time: 03/10/14  8:05 AM  Result Value Ref Range   Color, Urine YELLOW YELLOW   APPearance CLOUDY (A) CLEAR   Specific Gravity, Urine 1.020 1.005 - 1.030   pH 8.5 (H) 5.0 - 8.0   Glucose, UA NEGATIVE NEGATIVE mg/dL   Hgb urine dipstick NEGATIVE NEGATIVE   Bilirubin Urine NEGATIVE NEGATIVE   Ketones, ur NEGATIVE NEGATIVE mg/dL   Protein, ur NEGATIVE NEGATIVE mg/dL   Urobilinogen, UA 0.2 0.0 - 1.0 mg/dL   Nitrite NEGATIVE NEGATIVE   Leukocytes, UA NEGATIVE NEGATIVE  Wet prep, genital     Status: Abnormal   Collection Time: 03/10/14  8:50 AM  Result Value Ref Range   Yeast Wet Prep HPF POC NONE SEEN NONE SEEN   Trich, Wet Prep NONE SEEN NONE SEEN   Clue Cells Wet Prep HPF POC MANY (A) NONE SEEN   WBC, Wet Prep HPF POC MODERATE (A) NONE SEEN    MAU Course  Procedures None  MDM FHR - 150 bpm with doppler  UA and wet prep today Patient declined GC/Chlaydia Discussed patient with Dr. Marcelle OverlieHolland. He agrees with plan of care.   Assessment and Plan  A: SIUP at 5970w4d Bacterial vaginosis  P: Discharge home Rx for Flagyl sent to patient's pharmacy Advised Tylenol PRN for pain Patient encouraged to follow-up with [facility] as scheduled for routine prenatal care Patient may return to MAU as needed or if her condition were to change or worsen   Marny LowensteinJulie N Wenzel, PA-C  03/10/2014, 9:10 AM

## 2014-04-20 ENCOUNTER — Emergency Department (INDEPENDENT_AMBULATORY_CARE_PROVIDER_SITE_OTHER)
Admission: EM | Admit: 2014-04-20 | Discharge: 2014-04-20 | Disposition: A | Discharge status: Home / Self Care | Payer: 59 | Source: Home / Self Care | Attending: Family Medicine | Admitting: Family Medicine

## 2014-04-20 ENCOUNTER — Encounter (HOSPITAL_COMMUNITY): Payer: Self-pay | Admitting: Emergency Medicine

## 2014-04-20 DIAGNOSIS — J302 Other seasonal allergic rhinitis: Secondary | ICD-10-CM | POA: Diagnosis not present

## 2014-04-20 MED ORDER — METHYLPREDNISOLONE ACETATE 40 MG/ML IJ SUSP
INTRAMUSCULAR | Status: AC
  Filled 2014-04-20: qty 1

## 2014-04-20 MED ORDER — FLUTICASONE PROPIONATE 50 MCG/ACT NA SUSP: 1.0000 | Freq: Two times a day (BID) | NASAL | Status: DC

## 2014-04-20 MED ORDER — METHYLPREDNISOLONE ACETATE 40 MG/ML IJ SUSP: 80.0000 mg | Freq: Once | INTRAMUSCULAR | Status: DC

## 2014-04-20 MED ORDER — TRIAMCINOLONE ACETONIDE 40 MG/ML IJ SUSP
40.0000 mg | Freq: Once | INTRAMUSCULAR | Status: AC
  Administered 2014-04-20: 40 mg via INTRAMUSCULAR

## 2014-04-20 MED ORDER — CETIRIZINE HCL 10 MG PO TABS: 10.0000 mg | ORAL_TABLET | Freq: Every day | ORAL | Status: DC

## 2014-04-20 NOTE — ED Notes (Signed)
Verified depo dosage, 40 mg is the dose

## 2014-04-20 NOTE — ED Provider Notes (Signed)
CSN: [id number]     Arrival date & time 04/20/14  1526 History   First MD Initiated Contact with Patient 04/20/14 1719     Chief Complaint  Patient presents with  . Headache   (Consider location/radiation/quality/duration/timing/severity/associated sxs/prior Treatment) Patient is a 24 y.o. female presenting with headaches. The history is provided by the patient.  Headache Pain location:  Frontal Quality:  Dull Radiates to:  Does not radiate Onset quality:  Gradual Duration:  1 day Progression:  Unchanged Chronicity:  New Similar to prior headaches: no   Context comment:  Weather change Ineffective treatments:  Acetaminophen Associated symptoms: congestion, drainage and nausea   Associated symptoms: no fever, no neck pain and no numbness     Past Medical History  Diagnosis Date  . Asthma   . Anemia    Past Surgical History  Procedure Laterality Date  . Tonsillectomy     Family History  Problem Relation Age of Onset  . Hypertension Mother   . Hypertension Maternal Grandmother   . Hypertension Maternal Grandfather   . Diabetes Paternal Grandmother    History  Substance Use Topics  . Smoking status: Never Smoker   . Smokeless tobacco: Not on file  . Alcohol Use: No   OB History    Gravida Para Term Preterm AB TAB SAB Ectopic Multiple Living   1              Review of Systems  Constitutional: Negative.  Negative for fever.  HENT: Positive for congestion, postnasal drip and rhinorrhea.   Gastrointestinal: Positive for nausea.  Musculoskeletal: Negative for neck pain.  Neurological: Positive for headaches. Negative for numbness.    Allergies  Review of patient's allergies indicates no known allergies.  Home Medications   Prior to Admission medications   Medication Sig Start Date End Date Taking? Authorizing Provider  acetaminophen (TYLENOL) 325 MG tablet Take 325 mg by mouth every 6 (six) hours as needed for moderate pain.    Historical Provider, MD   albuterol (PROVENTIL HFA;VENTOLIN HFA) 108 (90 BASE) MCG/ACT inhaler Inhale 2 puffs into the lungs every 6 (six) hours as needed for shortness of breath.     Historical Provider, MD  clobetasol ointment (TEMOVATE) 0.05 % Apply 1 application topically as needed (to hands after washing or if inflamed).    Historical Provider, MD  metroNIDAZOLE (FLAGYL) 500 MG tablet Take 1 tablet (500 mg total) by mouth 2 (two) times daily. 03/10/14   Marny Lowenstein, PA-C  Prenatal Vit-Fe Fumarate-FA (PRENATAL MULTIVITAMIN) TABS tablet Take 1 tablet by mouth daily at 12 noon.    Historical Provider, MD  promethazine (PHENERGAN) 25 MG tablet Take 25 mg by mouth every 6 (six) hours as needed for nausea or vomiting.    Historical Provider, MD   BP 115/80 mmHg  Pulse 81  Temp(Src) 98 F (36.7 C) (Oral)  Resp 18  SpO2 99% Physical Exam  Constitutional: She is oriented to person, place, and time. She appears well-developed and well-nourished. No distress.  HENT:  Head: Normocephalic.  Right Ear: External ear normal.  Left Ear: External ear normal.  Nose: Mucosal edema and rhinorrhea present.  Mouth/Throat: Oropharynx is clear and moist.  Neck: Normal range of motion. Neck supple.  Cardiovascular: Normal heart sounds and intact distal pulses.   Pulmonary/Chest: Effort normal and breath sounds normal.  Lymphadenopathy:    She has no cervical adenopathy.  Neurological: She is alert and oriented to person, place, and time.  Skin: Skin  is warm and dry.  Nursing note and vitals reviewed.   ED Course  Procedures (including critical care time) Labs Review Labs Reviewed - No data to display  Imaging Review No results found.   MDM  No diagnosis found.     Linna HoffJames D Lekisha Mcghee, MD 04/20/14 [phone]

## 2014-04-20 NOTE — ED Notes (Signed)
C/o headache, coughing and congestion, phlegm green/yellow, and abdominal cramping.  Reports n/v, but no different from usual.  Reports having n/v every day due to pregnancy

## 2014-05-30 ENCOUNTER — Encounter (HOSPITAL_COMMUNITY): Payer: Self-pay | Admitting: Emergency Medicine

## 2014-05-30 ENCOUNTER — Emergency Department (HOSPITAL_COMMUNITY)
Admission: EM | Admit: 2014-05-30 | Discharge: 2014-05-30 | Disposition: A | Discharge status: Home / Self Care | Payer: 59 | Attending: Emergency Medicine | Admitting: Emergency Medicine

## 2014-05-30 ENCOUNTER — Emergency Department (HOSPITAL_COMMUNITY): Payer: 59

## 2014-05-30 DIAGNOSIS — O99012 Anemia complicating pregnancy, second trimester: Secondary | ICD-10-CM | POA: Diagnosis not present

## 2014-05-30 DIAGNOSIS — J45909 Unspecified asthma, uncomplicated: Secondary | ICD-10-CM | POA: Insufficient documentation

## 2014-05-30 DIAGNOSIS — O99019 Anemia complicating pregnancy, unspecified trimester: Secondary | ICD-10-CM

## 2014-05-30 DIAGNOSIS — Z3A26 26 weeks gestation of pregnancy: Secondary | ICD-10-CM | POA: Diagnosis not present

## 2014-05-30 DIAGNOSIS — O2332 Infections of other parts of urinary tract in pregnancy, second trimester: Secondary | ICD-10-CM | POA: Diagnosis not present

## 2014-05-30 DIAGNOSIS — R05 Cough: Secondary | ICD-10-CM

## 2014-05-30 DIAGNOSIS — N390 Urinary tract infection, site not specified: Secondary | ICD-10-CM | POA: Insufficient documentation

## 2014-05-30 DIAGNOSIS — J40 Bronchitis, not specified as acute or chronic: Secondary | ICD-10-CM

## 2014-05-30 DIAGNOSIS — R8271 Bacteriuria: Secondary | ICD-10-CM

## 2014-05-30 DIAGNOSIS — Z7951 Long term (current) use of inhaled steroids: Secondary | ICD-10-CM | POA: Insufficient documentation

## 2014-05-30 DIAGNOSIS — O99512 Diseases of the respiratory system complicating pregnancy, second trimester: Secondary | ICD-10-CM | POA: Diagnosis present

## 2014-05-30 DIAGNOSIS — R059 Cough, unspecified: Secondary | ICD-10-CM

## 2014-05-30 LAB — CBC
HEMATOCRIT: 27.2 % — AB (ref 36.0–46.0)
Hemoglobin: 9.1 g/dL — ABNORMAL LOW (ref 12.0–15.0)
MCH: 27.7 pg (ref 26.0–34.0)
MCHC: 33.5 g/dL (ref 30.0–36.0)
MCV: 82.7 fL (ref 78.0–100.0)
Platelets: 212 10*3/uL (ref 150–400)
RBC: 3.29 MIL/uL — ABNORMAL LOW (ref 3.87–5.11)
RDW: 12.9 % (ref 11.5–15.5)
WBC: 6 10*3/uL (ref 4.0–10.5)

## 2014-05-30 LAB — BASIC METABOLIC PANEL
ANION GAP: 8 (ref 5–15)
BUN: <5 mg/dL — ABNORMAL LOW (ref 6–20)
CHLORIDE: 104 mmol/L (ref 101–111)
CO2: 23 mmol/L (ref 22–32)
CREATININE: 0.44 mg/dL (ref 0.44–1.00)
Calcium: 8.5 mg/dL — ABNORMAL LOW (ref 8.9–10.3)
GFR calc non Af Amer: >60 mL/min (ref >60)
Glucose, Bld: 75 mg/dL (ref 70–99)
Potassium: 3.5 mmol/L (ref 3.5–5.1)
SODIUM: 135 mmol/L (ref 135–145)

## 2014-05-30 LAB — URINALYSIS, ROUTINE W REFLEX MICROSCOPIC
BILIRUBIN URINE: NEGATIVE
GLUCOSE, UA: NEGATIVE mg/dL
HGB URINE DIPSTICK: NEGATIVE
KETONES UR: NEGATIVE mg/dL
Nitrite: NEGATIVE
Protein, ur: NEGATIVE mg/dL
Specific Gravity, Urine: 1.023 (ref 1.005–1.030)
UROBILINOGEN UA: 1 mg/dL (ref 0.0–1.0)
pH: 6.5 (ref 5.0–8.0)

## 2014-05-30 LAB — URINE MICROSCOPIC-ADD ON

## 2014-05-30 MED ORDER — AZITHROMYCIN 250 MG PO TABS: 250.0000 mg | ORAL_TABLET | Freq: Every day | ORAL | Status: DC

## 2014-05-30 MED ORDER — PRENATAL COMPLETE 14-0.4 MG PO TABS: 1.0000 | ORAL_TABLET | Freq: Every day | ORAL | Status: DC

## 2014-05-30 MED ORDER — ACETAMINOPHEN 325 MG PO TABS
650.0000 mg | ORAL_TABLET | Freq: Once | ORAL | Status: AC
  Administered 2014-05-30: 650 mg via ORAL
  Filled 2014-05-30: qty 2

## 2014-05-30 MED ORDER — DEXTROSE 5 % IV SOLN
1.0000 g | Freq: Once | INTRAVENOUS | Status: AC
  Administered 2014-05-30: 1 g via INTRAVENOUS
  Filled 2014-05-30: qty 10

## 2014-05-30 MED ORDER — SODIUM CHLORIDE 0.9 % IV BOLUS (SEPSIS)
1000.0000 mL | Freq: Once | INTRAVENOUS | Status: AC
  Administered 2014-05-30: 1000 mL via INTRAVENOUS

## 2014-05-30 NOTE — Discharge Instructions (Signed)
Return to the emergency room with worsening of symptoms, new symptoms or with symptoms that are concerning , especially fevers, stiff neck, worsening headache, nausea/vomiting, visual changes or slurred speech, chest pain, shortness of breath, cough with thick colored mucous or blood Drink plenty of fluids with electrolytes. Continue to use your inhaler as needed.  Please take all of your antibiotics until finished!   You may develop abdominal discomfort or diarrhea from the antibiotic.  You may help offset this with probiotics which you can buy or get in yogurt. Do not eat  or take the probiotics until 2 hours after your antibiotic.  Please call your doctor for a followup appointment within 24-48 hours. Call the wellness center above. When you talk to your doctor please let them know that you were seen in the emergency department and have them acquire all of your records so that they can discuss the findings with you and formulate a treatment plan to fully care for your new and ongoing problems.  Read below information and follow recommendations. Pregnancy and Anemia Anemia is a condition in which the concentration of red blood cells or hemoglobin in the blood is below normal. Hemoglobin is a substance in red blood cells that carries oxygen to the tissues of the body. Anemia results in not enough oxygen reaching these tissues.  Anemia during pregnancy is common because the fetus uses more iron and folic acid as it is developing. Your body may not produce enough red blood cells because of this. Also, during pregnancy, the liquid part of the blood (plasma) increases by about 50%, and the red blood cells increase by only 25%. This lowers the concentration of the red blood cells and creates a natural anemia-like situation.  CAUSES  The most common cause of anemia during pregnancy is not having enough iron in the body to make red blood cells (iron deficiency anemia). Other causes may include:  Folic acid  deficiency.  Vitamin B12 deficiency.  Certain prescription or over-the-counter medicines.  Certain medical conditions or infections that destroy red blood cells.  A low platelet count and bleeding caused by antibodies that go through the placenta to the fetus from the mother's blood. SIGNS AND SYMPTOMS  Mild anemia may not be noticeable. If it becomes severe, symptoms may include:  Tiredness.  Shortness of breath, especially with exercise.  Weakness.  Fainting.  Pale looking skin.  Headaches.  Feeling a fast or irregular heartbeat (palpitations). DIAGNOSIS  The type of anemia is usually diagnosed from your family and medical history and blood tests. TREATMENT  Treatment of anemia during pregnancy depends on the cause of the anemia. Treatment can include:  Supplements of iron, vitamin B12, or folic acid.  A blood transfusion. This may be needed if blood loss is severe.  Hospitalization. This may be needed if there is significant continual blood loss.  Dietary changes. HOME CARE INSTRUCTIONS   Follow your dietitian's or health care provider's dietary recommendations.  Increase your vitamin C intake. This will help the stomach absorb more iron.  Eat a diet rich in iron. This would include foods such as:  Liver.  Beef.  Whole grain bread.  Eggs.  Dried fruit.  Take iron and vitamins as directed by your health care provider.  Eat green leafy vegetables. These are a good source of folic acid. SEEK MEDICAL CARE IF:   You have frequent or lasting headaches.  You are looking pale.  You are bruising easily. SEEK IMMEDIATE MEDICAL CARE IF:  You have extreme weakness, shortness of breath, or chest pain.  You become dizzy or have trouble concentrating.  You have heavy vaginal bleeding.  You develop a rash.  You have bloody or black, tarry stools.  You faint.  You vomit up blood.  You vomit repeatedly.  You have abdominal pain.  You have a fever  or persistent symptoms for more than 2-3 days.  You have a fever and your symptoms suddenly get worse.  You are dehydrated. MAKE SURE YOU:   Understand these instructions.  Will watch your condition.  Will get help right away if you are not doing well or get worse. Document Released: 01/11/2000 Document Revised: 11/03/2012 Document Reviewed: 08/25/2012 [facility] Patient Information 2015 [place], [place]. This information is not intended to replace advice given to you by your health care provider. Make sure you discuss any questions you have with your health care provider.  Acute Bronchitis Bronchitis is inflammation of the airways that extend from the windpipe into the lungs (bronchi). The inflammation often causes mucus to develop. This leads to a cough, which is the most common symptom of bronchitis.  In acute bronchitis, the condition usually develops suddenly and goes away over time, usually in a couple weeks. Smoking, allergies, and asthma can make bronchitis worse. Repeated episodes of bronchitis may cause further lung problems.  CAUSES Acute bronchitis is most often caused by the same virus that causes a cold. The virus can spread from person to person (contagious) through coughing, sneezing, and touching contaminated objects. SIGNS AND SYMPTOMS   Cough.   Fever.   Coughing up mucus.   Body aches.   Chest congestion.   Chills.   Shortness of breath.   Sore throat.  DIAGNOSIS  Acute bronchitis is usually diagnosed through a physical exam. Your health care provider will also ask you questions about your medical history. Tests, such as chest X-rays, are sometimes done to rule out other conditions.  TREATMENT  Acute bronchitis usually goes away in a couple weeks. Oftentimes, no medical treatment is necessary. Medicines are sometimes given for relief of fever or cough. Antibiotic medicines are usually not needed but may be prescribed in certain situations. In some  cases, an inhaler may be recommended to help reduce shortness of breath and control the cough. A cool mist vaporizer may also be used to help thin bronchial secretions and make it easier to clear the chest.  HOME CARE INSTRUCTIONS  Get plenty of rest.   Drink enough fluids to keep your urine clear or pale yellow (unless you have a medical condition that requires fluid restriction). Increasing fluids may help thin your respiratory secretions (sputum) and reduce chest congestion, and it will prevent dehydration.   Take medicines only as directed by your health care provider.  If you were prescribed an antibiotic medicine, finish it all even if you start to feel better.  Avoid smoking and secondhand smoke. Exposure to cigarette smoke or irritating chemicals will make bronchitis worse. If you are a smoker, consider using nicotine gum or skin patches to help control withdrawal symptoms. Quitting smoking will help your lungs heal faster.   Reduce the chances of another bout of acute bronchitis by washing your hands frequently, avoiding people with cold symptoms, and trying not to touch your hands to your mouth, nose, or eyes.   Keep all follow-up visits as directed by your health care provider.  SEEK MEDICAL CARE IF: Your symptoms do not improve after 1 week of treatment.  SEEK  IMMEDIATE MEDICAL CARE IF:  You develop an increased fever or chills.   You have chest pain.   You have severe shortness of breath.  You have bloody sputum.   You develop dehydration.  You faint or repeatedly feel like you are going to pass out.  You develop repeated vomiting.  You develop a severe headache. MAKE SURE YOU:   Understand these instructions.  Will watch your condition.  Will get help right away if you are not doing well or get worse. Document Released: 02/21/2004 Document Revised: 05/30/2013 Document Reviewed: 07/06/2012 [facility] Patient Information 2015 [place]. This  information is not intended to replace advice given to you by your health care provider. Make sure you discuss any questions you have with your health care provider.

## 2014-05-30 NOTE — ED Notes (Signed)
Pt arrived to ED with c/o nasal congestion, ear congestion, feeling like she is going to pass out and stated that she vomited a small amount of blood this morning. Pt stated congestion started Friday and the feeling like she was going to pass out started this morning along with vomiting blood x1. Pt is also [redacted] weeks pregnant. Denies any problems with pregnancy.

## 2014-05-30 NOTE — ED Provider Notes (Signed)
CSN: [id number]     Arrival date & time 05/30/14  [id number] History   First MD Initiated Contact with Patient 05/30/14 [id number]     Chief Complaint  Patient presents with  . Nasal Congestion  . Cough  . Fatigue  . Hemoptysis     (Consider location/radiation/quality/duration/timing/severity/associated sxs/prior Treatment) HPI  Genavive N Apachito is a 24 y.o. female with PMH of anemia, asthma presenting with 5 day history of URI symptoms including nasal congestion, ear congestion as well as cough productive of thick yellowish sputum. Patient stated this morning she noted a small amount of red streaks in the sputum but hasn't had any since. Pt has been taking cough drops, tea, Tylenol with some improvement of her symptoms. She denies aggravating factors. Patient also noted near syncope with lightheadedness and tunnel vision that resolved when she sat down. No loss of consciousness. No headache, slurred speech, visual changes, focal weakness. She denies any abdominal pain or vaginal bleeding. Patient states she has had prenatal care and saw her OB/GYN yesterday.   Past Medical History  Diagnosis Date  . Asthma   . Anemia    Past Surgical History  Procedure Laterality Date  . Tonsillectomy     Family History  Problem Relation Age of Onset  . Hypertension Mother   . Hypertension Maternal Grandmother   . Hypertension Maternal Grandfather   . Diabetes Paternal Grandmother    History  Substance Use Topics  . Smoking status: Never Smoker   . Smokeless tobacco: Not on file  . Alcohol Use: No   OB History    Gravida Para Term Preterm AB TAB SAB Ectopic Multiple Living   1              Review of Systems 10 Systems reviewed and are negative for acute change except as noted in the HPI.    Allergies  Review of patient's allergies indicates no known allergies.  Home Medications   Prior to Admission medications   Medication Sig Start Date End Date Taking? Authorizing Provider   acetaminophen (TYLENOL) 325 MG tablet Take 325 mg by mouth every 6 (six) hours as needed for moderate pain.   Yes Historical Provider, MD  clobetasol ointment (TEMOVATE) 0.05 % Apply 1 application topically as needed (to hands after washing or if inflamed).   Yes Historical Provider, MD  fluticasone (FLONASE) 50 MCG/ACT nasal spray Place 1 spray into both nostrils 2 (two) times daily. 04/20/14  Yes Linna HoffJames D Kindl, MD  Prenatal Vit-Fe Fumarate-FA (PRENATAL MULTIVITAMIN) TABS tablet Take 1 tablet by mouth daily at 12 noon.   Yes Historical Provider, MD  azithromycin (ZITHROMAX) 250 MG tablet Take 1 tablet (250 mg total) by mouth daily. Take first 2 tablets together, then 1 every day until finished. 05/30/14   Oswaldo ConroyVictoria Meilin Brosh, PA-C  cetirizine (ZYRTEC) 10 MG tablet Take 1 tablet (10 mg total) by mouth daily. One tab daily for allergies Patient not taking: Reported on 05/30/2014 04/20/14   Linna HoffJames D Kindl, MD  metroNIDAZOLE (FLAGYL) 500 MG tablet Take 1 tablet (500 mg total) by mouth 2 (two) times daily. Patient not taking: Reported on 05/30/2014 03/10/14   Marny LowensteinJulie N Wenzel, PA-C  Prenatal Vit-Fe Fumarate-FA (PRENATAL COMPLETE) 14-0.4 MG TABS Take 1 tablet by mouth daily. 05/30/14   Oswaldo ConroyVictoria Kentrail Shew, PA-C   BP 111/73 mmHg  Pulse 89  Temp(Src) 98.1 F (36.7 C) (Oral)  Resp 16  SpO2 100% Physical Exam  Constitutional: She is oriented to person, place, and time.  She appears well-developed and well-nourished. No distress.  HENT:  Head: Normocephalic and atraumatic.  Mouth/Throat: No oropharyngeal exudate.  Oropharynx erythematous with mild edema with thrush Bilateral TM without erythema with intact light reflex without obvious infection.  Eyes: Conjunctivae and EOM are normal. Pupils are equal, round, and reactive to light. Right eye exhibits no discharge. Left eye exhibits no discharge.  Cardiovascular: Normal rate and regular rhythm.   Pulmonary/Chest: Effort normal and breath sounds normal. No respiratory  distress. She has no wheezes.  Abdominal: Soft. Bowel sounds are normal. She exhibits no distension. There is no tenderness.  Lymphadenopathy:    She has cervical adenopathy.  Neurological: She is alert and oriented to person, place, and time. No cranial nerve deficit. She exhibits normal muscle tone. Coordination normal.  Speech fluent and goal oriented. Patient moves all four extremities without any focal neurological deficits and has no facial droop. Normal gait.   Skin: Skin is warm and dry. She is not diaphoretic.  Nursing note and vitals reviewed.   ED Course  Procedures (including critical care time) Labs Review Labs Reviewed  CBC - Abnormal; Notable for the following:    RBC 3.29 (*)    Hemoglobin 9.1 (*)    HCT 27.2 (*)    All other components within normal limits  BASIC METABOLIC PANEL - Abnormal; Notable for the following:    BUN <5 (*)    Calcium 8.5 (*)    All other components within normal limits  URINALYSIS, ROUTINE W REFLEX MICROSCOPIC - Abnormal; Notable for the following:    APPearance CLOUDY (*)    Leukocytes, UA LARGE (*)    All other components within normal limits  URINE MICROSCOPIC-ADD ON - Abnormal; Notable for the following:    Squamous Epithelial / LPF MANY (*)    Bacteria, UA MANY (*)    All other components within normal limits  URINE CULTURE    Imaging Review Dg Chest 2 View  05/30/2014   CLINICAL DATA:  Cough, chest pain starting Saturday, [redacted] weeks pregnant  EXAM: CHEST  2 VIEW  COMPARISON:  05/19/2012  FINDINGS: Cardiomediastinal silhouette is stable. No acute infiltrate or pleural effusion. No pulmonary edema. Bony thorax is unremarkable.  IMPRESSION: No active cardiopulmonary disease.  The patient's abdomen was double shielded during examination.   Electronically Signed   By: Natasha Mead M.D.   On: 05/30/2014 12:16     EKG Interpretation   Date/Time:  Tuesday May 30 2014 11:19:13 EDT Ventricular Rate:  84 PR Interval:  164 QRS Duration:  82 QT Interval:  339 QTC Calculation: 401 R Axis:   49 Text Interpretation:  Sinus rhythm Confirmed by Donnald Garre, MD, Lebron Conners  [phone]) on 05/30/2014 11:31:41 AM      MDM   Final diagnoses:  Cough  Bronchitis  Bacteriuria  Anemia during pregnancy   Patient presenting with nasal congestion, ear congestion right symptoms as well as small amount of blood in sputum/emesis. Patient notes [redacted] weeks pregnant. Patient also reported near syncopal episode but no dizziness. No chest pain or shortness of breath. Pt given fluids and tylenol in ED with some improvement of symptoms. VSS. No tachypnea or hypoxia. Patient low risk for PE with risk factors including her pregnancy. I doubt PE in the setting of URI symptoms with cough. Dicussed the risks and benefits of CXR in pregnancy and pt wishes to proceed with xray. Pt abdomen was double shielded. Chest x-ray negative for pneumonia and I suspect bronchitis. Patient given Rocephin  in the ED and will be discharged with azithromycin.  UA with likely contaminant with many squamous and many bacteria. Pending culture. Patient denies urinary symptoms. Pt given rocephin and will await results of culture for further treatment.  Pt with history of anemia with hemoglobin of 9.1 today. Patient given prescription for prenatal vitamins with iron and is instructed to take. Referral to wellness center for follow up. Pt scheduled to see OBGYN in 2 days.  Discussed return precautions with patient. Discussed all results and patient verbalizes understanding and agrees with plan.  This is a shared patient. This patient was discussed with the physician who saw and evaluated the patient and agrees with the plan.   Oswaldo Conroy, PA-C 05/30/14 1314  [address] PA-C 05/30/14 1358  Arby Barrette, MD 06/07/14 [phone]

## 2014-05-30 NOTE — ED Notes (Signed)
Patient returned from X-ray 

## 2014-06-01 LAB — URINE CULTURE

## 2014-06-13 LAB — US OB FOLLOW UP

## 2014-06-14 ENCOUNTER — Other Ambulatory Visit (HOSPITAL_COMMUNITY): Payer: Self-pay | Admitting: Obstetrics and Gynecology

## 2014-06-14 DIAGNOSIS — O43129 Velamentous insertion of umbilical cord, unspecified trimester: Secondary | ICD-10-CM

## 2014-06-20 ENCOUNTER — Encounter (HOSPITAL_COMMUNITY): Payer: Self-pay

## 2014-06-20 ENCOUNTER — Ambulatory Visit (HOSPITAL_COMMUNITY)
Admission: RE | Admit: 2014-06-20 | Discharge: 2014-06-20 | Disposition: A | Discharge status: Home / Self Care | Payer: 59 | Source: Ambulatory Visit | Attending: Obstetrics and Gynecology | Admitting: Obstetrics and Gynecology

## 2014-06-20 DIAGNOSIS — Z3689 Encounter for other specified antenatal screening: Secondary | ICD-10-CM | POA: Insufficient documentation

## 2014-06-20 DIAGNOSIS — O43129 Velamentous insertion of umbilical cord, unspecified trimester: Secondary | ICD-10-CM | POA: Insufficient documentation

## 2014-06-20 DIAGNOSIS — Z3A29 29 weeks gestation of pregnancy: Secondary | ICD-10-CM | POA: Insufficient documentation

## 2014-06-20 DIAGNOSIS — O43123 Velamentous insertion of umbilical cord, third trimester: Secondary | ICD-10-CM | POA: Diagnosis not present

## 2014-06-20 DIAGNOSIS — Z36 Encounter for antenatal screening of mother: Secondary | ICD-10-CM | POA: Diagnosis present

## 2014-06-21 ENCOUNTER — Other Ambulatory Visit (HOSPITAL_COMMUNITY): Payer: Self-pay | Admitting: Obstetrics and Gynecology

## 2014-06-21 ENCOUNTER — Encounter (HOSPITAL_COMMUNITY): Payer: Self-pay | Admitting: Obstetrics and Gynecology

## 2014-07-01 ENCOUNTER — Encounter (HOSPITAL_COMMUNITY): Payer: Self-pay | Admitting: *Deleted

## 2014-07-01 ENCOUNTER — Inpatient Hospital Stay (HOSPITAL_COMMUNITY)
Admission: AD | Admit: 2014-07-01 | Discharge: 2014-07-02 | Disposition: A | Discharge status: Home / Self Care | Payer: 59 | Source: Ambulatory Visit | Attending: Obstetrics & Gynecology | Admitting: Obstetrics & Gynecology

## 2014-07-01 DIAGNOSIS — B9689 Other specified bacterial agents as the cause of diseases classified elsewhere: Secondary | ICD-10-CM | POA: Diagnosis not present

## 2014-07-01 DIAGNOSIS — D649 Anemia, unspecified: Secondary | ICD-10-CM | POA: Insufficient documentation

## 2014-07-01 DIAGNOSIS — B373 Candidiasis of vulva and vagina: Secondary | ICD-10-CM | POA: Insufficient documentation

## 2014-07-01 DIAGNOSIS — O99013 Anemia complicating pregnancy, third trimester: Secondary | ICD-10-CM | POA: Insufficient documentation

## 2014-07-01 DIAGNOSIS — O98813 Other maternal infectious and parasitic diseases complicating pregnancy, third trimester: Secondary | ICD-10-CM | POA: Insufficient documentation

## 2014-07-01 DIAGNOSIS — R109 Unspecified abdominal pain: Secondary | ICD-10-CM

## 2014-07-01 DIAGNOSIS — Z3A31 31 weeks gestation of pregnancy: Secondary | ICD-10-CM | POA: Insufficient documentation

## 2014-07-01 DIAGNOSIS — N760 Acute vaginitis: Secondary | ICD-10-CM | POA: Diagnosis not present

## 2014-07-01 DIAGNOSIS — O23593 Infection of other part of genital tract in pregnancy, third trimester: Secondary | ICD-10-CM | POA: Insufficient documentation

## 2014-07-01 DIAGNOSIS — O26899 Other specified pregnancy related conditions, unspecified trimester: Secondary | ICD-10-CM

## 2014-07-01 DIAGNOSIS — O9989 Other specified diseases and conditions complicating pregnancy, childbirth and the puerperium: Secondary | ICD-10-CM | POA: Diagnosis not present

## 2014-07-01 LAB — CBC
HEMATOCRIT: 25.7 % — AB (ref 36.0–46.0)
Hemoglobin: 8.7 g/dL — ABNORMAL LOW (ref 12.0–15.0)
MCH: 27.7 pg (ref 26.0–34.0)
MCHC: 33.9 g/dL (ref 30.0–36.0)
MCV: 81.8 fL (ref 78.0–100.0)
Platelets: 207 10*3/uL (ref 150–400)
RBC: 3.14 MIL/uL — AB (ref 3.87–5.11)
RDW: 13.2 % (ref 11.5–15.5)
WBC: 6.8 10*3/uL (ref 4.0–10.5)

## 2014-07-01 LAB — URINALYSIS, ROUTINE W REFLEX MICROSCOPIC
Bilirubin Urine: NEGATIVE
Glucose, UA: NEGATIVE mg/dL
Hgb urine dipstick: NEGATIVE
KETONES UR: NEGATIVE mg/dL
Nitrite: NEGATIVE
Protein, ur: NEGATIVE mg/dL
SPECIFIC GRAVITY, URINE: 1.01 (ref 1.005–1.030)
Urobilinogen, UA: 0.2 mg/dL (ref 0.0–1.0)
pH: 5.5 (ref 5.0–8.0)

## 2014-07-01 LAB — COMPREHENSIVE METABOLIC PANEL
ALBUMIN: 3.2 g/dL — AB (ref 3.5–5.0)
ALT: 14 U/L (ref 14–54)
ANION GAP: 4 — AB (ref 5–15)
AST: 22 U/L (ref 15–41)
Alkaline Phosphatase: 50 U/L (ref 38–126)
BUN: 6 mg/dL (ref 6–20)
CHLORIDE: 108 mmol/L (ref 101–111)
CO2: 22 mmol/L (ref 22–32)
CREATININE: 0.38 mg/dL — AB (ref 0.44–1.00)
Calcium: 8.6 mg/dL — ABNORMAL LOW (ref 8.9–10.3)
GLUCOSE: 81 mg/dL (ref 65–99)
POTASSIUM: 3.4 mmol/L — AB (ref 3.5–5.1)
Sodium: 134 mmol/L — ABNORMAL LOW (ref 135–145)
TOTAL PROTEIN: 6.6 g/dL (ref 6.5–8.1)
Total Bilirubin: 0.3 mg/dL (ref 0.3–1.2)

## 2014-07-01 LAB — URINE MICROSCOPIC-ADD ON

## 2014-07-01 LAB — WET PREP, GENITAL: Trich, Wet Prep: NONE SEEN

## 2014-07-01 MED ORDER — GI COCKTAIL ~~LOC~~
30.0000 mL | Freq: Once | ORAL | Status: AC
  Administered 2014-07-01: 30 mL via ORAL
  Filled 2014-07-01: qty 30

## 2014-07-01 MED ORDER — OXYCODONE-ACETAMINOPHEN 5-325 MG PO TABS
2.0000 | ORAL_TABLET | Freq: Once | ORAL | Status: AC
  Administered 2014-07-01: 2 via ORAL
  Filled 2014-07-01: qty 2

## 2014-07-01 NOTE — MAU Provider Note (Signed)
History     CSN: [id number]  Arrival date and time: 07/01/14 2201   None     Chief Complaint  Patient presents with  . Contractions   HPI 24 y.o. G1P0 at [redacted]w[redacted]d w/ abdominal pain x 1 hour. Pt states she had started eating a cheeseburger and began having epigstric pain which is constant, also has lower abd pain that comes and goes, radiates up right side. + nausea/vomiting, no diarrhea or constipation. Uncomplicated prenatal course except for velamentous cord insertion on u/s.   Past Medical History  Diagnosis Date  . Asthma   . Anemia     Past Surgical History  Procedure Laterality Date  . Tonsillectomy      Family History  Problem Relation Age of Onset  . Hypertension Mother   . Hypertension Maternal Grandmother   . Hypertension Maternal Grandfather   . Diabetes Paternal Grandmother     History  Substance Use Topics  . Smoking status: Never Smoker   . Smokeless tobacco: Not on file  . Alcohol Use: No    Allergies: No Known Allergies  Prescriptions prior to admission  Medication Sig Dispense Refill Last Dose  . clobetasol ointment (TEMOVATE) 0.05 % Apply 1 application topically as needed (to hands after washing or if inflamed).   06/30/2014 at Unknown time  . Prenatal Vit-Fe Fumarate-FA (PRENATAL COMPLETE) 14-0.4 MG TABS Take 1 tablet by mouth daily. 30 each 0 07/01/2014 at Unknown time  . acetaminophen (TYLENOL) 325 MG tablet Take 325 mg by mouth every 6 (six) hours as needed for moderate pain.   Not Taking  . azithromycin (ZITHROMAX) 250 MG tablet Take 1 tablet (250 mg total) by mouth daily. Take first 2 tablets together, then 1 every day until finished. (Patient not taking: Reported on 06/20/2014) 6 tablet 0 Not Taking  . cetirizine (ZYRTEC) 10 MG tablet Take 1 tablet (10 mg total) by mouth daily. One tab daily for allergies (Patient not taking: Reported on 05/30/2014) 30 tablet 1 Not Taking  . fluticasone (FLONASE) 50 MCG/ACT nasal spray Place 1 spray into both nostrils  2 (two) times daily. (Patient not taking: Reported on 06/20/2014) 16 g 2 Not Taking  . metroNIDAZOLE (FLAGYL) 500 MG tablet Take 1 tablet (500 mg total) by mouth 2 (two) times daily. (Patient not taking: Reported on 05/30/2014) 14 tablet 0 Not Taking  . Prenatal Vit-Fe Fumarate-FA (PRENATAL MULTIVITAMIN) TABS tablet Take 1 tablet by mouth daily at 12 noon.   Not Taking    Review of Systems  Constitutional: Negative.   Respiratory: Negative.   Cardiovascular: Negative.   Gastrointestinal: Positive for nausea, vomiting and abdominal pain. Negative for diarrhea and constipation.  Genitourinary: Negative for dysuria, urgency, frequency, hematuria and flank pain.       Negative for vaginal bleeding, cramping/contractions  Musculoskeletal: Negative.   Neurological: Negative.   Psychiatric/Behavioral: Negative.    Physical Exam   Blood pressure 110/72, pulse 99, temperature 98.7 F (37.1 C), temperature source Oral, resp. rate 18, height  (1.575 m), last menstrual period 11/26/2013.  Physical Exam  Nursing note and vitals reviewed. Constitutional: She is oriented to person, place, and time. She appears well-developed and well-nourished. No distress.  Cardiovascular: Normal rate.   Respiratory: Effort normal.  GI: Soft. There is tenderness (epigastric, diffuse).  Genitourinary: There is no rash, tenderness or lesion on the right labia. There is no rash, tenderness or lesion on the left labia. Uterus is enlarged (c/w dates). Uterus is not tender. No tenderness  or bleeding in the vagina. Vaginal discharge (white) found.   Closed/long/post   Musculoskeletal: Normal range of motion.  Neurological: She is alert and oriented to person, place, and time.  Skin: Skin is warm and dry.  Psychiatric: She has a normal mood and affect.   FHR: 125 bpm, variability: moderate,  accelerations:  Present,  decelerations:  Absent none  MAU Course  Procedures  Results for orders placed or performed  during the hospital encounter of 07/01/14 (from the past 24 hour(s))  Wet prep, genital     Status: Abnormal   Collection Time: 07/01/14 10:35 PM  Result Value Ref Range   Yeast Wet Prep HPF POC MODERATE (A) NONE SEEN   Trich, Wet Prep NONE SEEN NONE SEEN   Clue Cells Wet Prep HPF POC MODERATE (A) NONE SEEN   WBC, Wet Prep HPF POC MODERATE (A) NONE SEEN     Assessment and Plan   1. Abdominal pain in pregnancy, antepartum   No obvious cause for pain tonight, pain not improved w/ GI cocktail, but decreased w/ Percocet. Reassuring maternal-fetal status. Precautions rev'd, percocet 5/325 #15 provided for pain, f/u in office on Monday  2. Anemia in pregnancy Chronic anemia per patient. Hgb 8.7 today, 9.1 at last check 05/2014, pt not taking iron supplement other than prenatal vitamin. Rx ferrous sulfate, f/u w/ prenatal care provider.   3. BV  - treat w/ flagyl  4. Yeast - treat w/ diflucan      Medication List    STOP taking these medications        acetaminophen 325 MG tablet  Commonly known as:  TYLENOL     azithromycin 250 MG tablet  Commonly known as:  ZITHROMAX     cetirizine 10 MG tablet  Commonly known as:  ZYRTEC     fluticasone 50 MCG/ACT nasal spray  Commonly known as:  FLONASE     prenatal multivitamin Tabs tablet      TAKE these medications        clobetasol ointment 0.05 %  Commonly known as:  TEMOVATE  Apply 1 application topically as needed (to hands after washing or if inflamed).     ferrous sulfate 325 (65 FE) MG tablet  Take 1 tablet (325 mg total) by mouth 3 (three) times daily with meals.     fluconazole 150 MG tablet  Commonly known as:  DIFLUCAN  Take 1 tablet (150 mg total) by mouth once. Repeat in 3 days.     metroNIDAZOLE 500 MG tablet  Commonly known as:  FLAGYL  Take 1 tablet (500 mg total) by mouth 2 (two) times daily.     oxyCODONE-acetaminophen 5-325 MG per tablet  Commonly known as:  PERCOCET/ROXICET  Take 1-2 tablets by mouth  every 4 (four) hours as needed for severe pain.     PRENATAL COMPLETE 14-0.4 MG Tabs  Take 1 tablet by mouth daily.            Follow-up Information    Follow up with MORRIS, MEGAN, DO. Call on 07/03/2014.   Specialty:  Obstetrics and Gynecology   Contact information:   [address], [address], [address] [place] [address] [phone]         Georges MouseFRAZIER,Carleen Rhue 07/01/2014, 10:18 PM

## 2014-07-01 NOTE — MAU Note (Signed)
Patient states contractions started about an hour ago and are very painful.  Denies LOF or vaginal bleeding.  States feeling baby move well.

## 2014-07-02 DIAGNOSIS — O23593 Infection of other part of genital tract in pregnancy, third trimester: Secondary | ICD-10-CM | POA: Diagnosis not present

## 2014-07-02 DIAGNOSIS — R109 Unspecified abdominal pain: Secondary | ICD-10-CM

## 2014-07-02 DIAGNOSIS — O9989 Other specified diseases and conditions complicating pregnancy, childbirth and the puerperium: Secondary | ICD-10-CM | POA: Diagnosis not present

## 2014-07-02 MED ORDER — METRONIDAZOLE 500 MG PO TABS: 500.0000 mg | ORAL_TABLET | Freq: Two times a day (BID) | ORAL | Status: DC

## 2014-07-02 MED ORDER — FERROUS SULFATE 325 (65 FE) MG PO TABS: 325.0000 mg | ORAL_TABLET | Freq: Three times a day (TID) | ORAL | Status: DC

## 2014-07-02 MED ORDER — FLUCONAZOLE 150 MG PO TABS: 150.0000 mg | ORAL_TABLET | Freq: Once | ORAL | Status: DC

## 2014-07-02 MED ORDER — OXYCODONE-ACETAMINOPHEN 5-325 MG PO TABS: 1.0000 | ORAL_TABLET | ORAL | Status: DC | PRN

## 2014-07-03 LAB — CULTURE, OB URINE

## 2014-07-03 LAB — GC/CHLAMYDIA PROBE AMP (~~LOC~~) NOT AT ARMC
Chlamydia: NEGATIVE
Neisseria Gonorrhea: NEGATIVE

## 2014-07-19 ENCOUNTER — Inpatient Hospital Stay (HOSPITAL_COMMUNITY): Payer: 59

## 2014-07-19 ENCOUNTER — Encounter (HOSPITAL_COMMUNITY): Payer: Self-pay | Admitting: *Deleted

## 2014-07-19 ENCOUNTER — Inpatient Hospital Stay (HOSPITAL_COMMUNITY)
Admission: AD | Admit: 2014-07-19 | Discharge: 2014-07-19 | Disposition: A | Discharge status: Home / Self Care | Payer: 59 | Source: Ambulatory Visit | Attending: Obstetrics and Gynecology | Admitting: Obstetrics and Gynecology

## 2014-07-19 DIAGNOSIS — O9A213 Injury, poisoning and certain other consequences of external causes complicating pregnancy, third trimester: Secondary | ICD-10-CM | POA: Insufficient documentation

## 2014-07-19 DIAGNOSIS — O99891 Other specified diseases and conditions complicating pregnancy: Secondary | ICD-10-CM

## 2014-07-19 DIAGNOSIS — O9989 Other specified diseases and conditions complicating pregnancy, childbirth and the puerperium: Secondary | ICD-10-CM

## 2014-07-19 DIAGNOSIS — O26899 Other specified pregnancy related conditions, unspecified trimester: Secondary | ICD-10-CM | POA: Diagnosis not present

## 2014-07-19 DIAGNOSIS — W06XXXA Fall from bed, initial encounter: Secondary | ICD-10-CM | POA: Insufficient documentation

## 2014-07-19 DIAGNOSIS — R202 Paresthesia of skin: Secondary | ICD-10-CM | POA: Insufficient documentation

## 2014-07-19 DIAGNOSIS — O368130 Decreased fetal movements, third trimester, not applicable or unspecified: Secondary | ICD-10-CM | POA: Insufficient documentation

## 2014-07-19 DIAGNOSIS — O43123 Velamentous insertion of umbilical cord, third trimester: Secondary | ICD-10-CM | POA: Insufficient documentation

## 2014-07-19 DIAGNOSIS — Z3A33 33 weeks gestation of pregnancy: Secondary | ICD-10-CM | POA: Insufficient documentation

## 2014-07-19 DIAGNOSIS — M549 Dorsalgia, unspecified: Secondary | ICD-10-CM

## 2014-07-19 MED ORDER — ACETAMINOPHEN 325 MG PO TABS
650.0000 mg | ORAL_TABLET | Freq: Once | ORAL | Status: AC
  Administered 2014-07-19: 650 mg via ORAL
  Filled 2014-07-19: qty 2

## 2014-07-19 NOTE — MAU Note (Addendum)
C/o fall this AM around 0710am; fell in her bedroom onto her R side; c/o decreased fetal movement since fall; denies any leaking vaginal fluid or spotting; c/o intermittent numbness and tingling in R leg for past 4 days;

## 2014-07-19 NOTE — MAU Provider Note (Signed)
History     CSN: [id number]  Arrival date and time: 07/19/14 0745   First Provider Initiated Contact with Patient 07/19/14 [phone]      Chief Complaint  Patient presents with  . Fall  . tingling legs    HPI Heather Malone 24 y.o. G1P0  presents to MAU complaining of a fall this morning with decreased fetal movement since.  When she woke at 7am she went to get out of bed and immediately fell to the floor on her right side hitting the ground with her knees and belly.  She states since then she has not been feeling the baby move.  She is having abdominal pain, 4/10 across entire abdomen.  It is sharp and comes and goes.  She does not think this is contractions.  She denies vaginal bleeding, LOF, dysuria.  She states for 4 or 5 days she is having sharp tingling in her right leg that goes up down leg into the bottom of feet and toes.  She states entire leg is involved - not just side or back.  She also notes associated back pain in the mid- lower back.  Position changes are sometimes helpful.  Laying on left side tends to be better.   OB History    Gravida Para Term Preterm AB TAB SAB Ectopic Multiple Living   1               Past Medical History  Diagnosis Date  . Asthma   . Anemia     Past Surgical History  Procedure Laterality Date  . Tonsillectomy      Family History  Problem Relation Age of Onset  . Hypertension Mother   . Hypertension Maternal Grandmother   . Hypertension Maternal Grandfather   . Diabetes Paternal Grandmother     History  Substance Use Topics  . Smoking status: Never Smoker   . Smokeless tobacco: Not on file  . Alcohol Use: No    Allergies: No Known Allergies  Prescriptions prior to admission  Medication Sig Dispense Refill Last Dose  . calcium carbonate (TUMS - DOSED IN MG ELEMENTAL CALCIUM) 500 MG chewable tablet Chew 1 tablet by mouth daily as needed for indigestion or heartburn.   Past Week at Unknown time  . ferrous sulfate 325 (65  FE) MG tablet Take 1 tablet (325 mg total) by mouth 3 (three) times daily with meals. 90 tablet 11 07/18/2014 at Unknown time  . metroNIDAZOLE (FLAGYL) 500 MG tablet Take 1 tablet (500 mg total) by mouth 2 (two) times daily. 14 tablet 0 07/18/2014 at Unknown time  . oxyCODONE-acetaminophen (PERCOCET/ROXICET) 5-325 MG per tablet Take 1-2 tablets by mouth every 4 (four) hours as needed for severe pain. 15 tablet 0 07/18/2014 at Unknown time  . Prenatal Vit-Fe Fumarate-FA (PRENATAL COMPLETE) 14-0.4 MG TABS Take 1 tablet by mouth daily. 30 each 0 07/18/2014 at Unknown time    ROS Pertinent ROS in HPI.  All other systems are negative.   Physical Exam   Blood pressure 113/68, pulse 100, temperature 98.8 F (37.1 C), temperature source Oral, resp. rate 16, last menstrual period 11/26/2013.  Physical Exam  Constitutional: She is oriented to person, place, and time. She appears well-developed and well-nourished. No distress.  HENT:  Head: Normocephalic and atraumatic.  Eyes: EOM are normal.  Neck: Normal range of motion.  Cardiovascular: Normal rate.   Respiratory: Effort normal and breath sounds normal. No respiratory distress.  GI: Soft. There is no tenderness.  Musculoskeletal: Normal range of motion.  Tenderness to palpation in right lower back  Neurological: She is alert and oriented to person, place, and time.  Bottom of right foot with tingly with light touch.  Otherwise sensation equal bilaterally in lower extremities. Pain in back with raising right leg  Skin: Skin is warm and dry.  Psychiatric: She has a normal mood and affect.   US Ob Limited  07/19/2014   OBSTETRICAL ULTRASOUND: This exam was performed within a [facility]. The OB US report was generated in the [facility] system, and faxed to the ordering physician.   This report is available in the [employer]. See the [facility] Obstetric US report via the Image Link.   MAU Course  Procedures  MDM Tylenol given for mild  abdominal pain/headache.  (Nurse reports pt newly complaining of mild HA) Discussed with Dr. Renaldo Fiddler whom advises for U/S and 4 hours of monitoring.    Fetal Tracing: Baseline:120 Variability:mod Accelerations: 15x15 Decelerations:none Toco:none  U/S report and fetal monitoring discussed with MD whom is agreeable for discharge to home Pt to f/u in clinic routinely  Assessment and Plan  A:  1. Back pain affecting pregnancy   2. Traumatic injury during pregnancy in third trimester   3. Umbilical cord, velamentous insertion, third trimester    P: Discharge to home Keep appts for [facility] [employer] Maternity support band Careful upon rising/walking Ice/gentle stretches Patient may return to MAU as needed or if her condition were to change or worsen   Bertram Denver 07/19/2014, 8:33 AM

## 2014-07-19 NOTE — MAU Note (Signed)
Pt states she fell getting out of bed this morning around 0700, fell on her R side.  Is now having abd pain, denies bleeding or LOF.  Pt also states she has had tingling in her legs since Sunday.  Reports good fetal movement.

## 2014-07-19 NOTE — Discharge Instructions (Signed)
Back Pain in Pregnancy °Back pain during pregnancy is common. It happens in about half of all pregnancies. It is important for you and your baby that you remain active during your pregnancy. If you feel that back pain is not allowing you to remain active or sleep well, it is time to see your caregiver. Back pain may be caused by several factors related to changes during your pregnancy. Fortunately, unless you had trouble with your back before your pregnancy, the pain is likely to get better after you deliver. °Low back pain usually occurs between the fifth and seventh months of pregnancy. It can, however, happen in the first couple months. Factors that increase the risk of back problems include:  °· Previous back problems. °· Injury to your back. °· Having twins or multiple births. °· A chronic cough. °· Stress. °· Job-related repetitive motions. °· Muscle or spinal disease in the back. °· Family history of back problems, ruptured (herniated) discs, or osteoporosis. °· Depression, anxiety, and panic attacks. °CAUSES  °· When you are pregnant, your body produces a hormone called relaxin. This hormone makes the ligaments connecting the low back and pubic bones more flexible. This flexibility allows the baby to be delivered more easily. When your ligaments are loose, your muscles need to work harder to support your back. Soreness in your back can come from tired muscles. Soreness can also come from back tissues that are irritated since they are receiving less support. °· As the baby grows, it puts pressure on the nerves and blood vessels in your pelvis. This can cause back pain. °· As the baby grows and gets heavier during pregnancy, the uterus pushes the stomach muscles forward and changes your center of gravity. This makes your back muscles work harder to maintain good posture. °SYMPTOMS  °Lumbar pain during pregnancy °Lumbar pain during pregnancy usually occurs at or above the waist in the center of the back. There  may be pain and numbness that radiates into your leg or foot. This is similar to low back pain experienced by non-pregnant women. It usually increases with sitting for long periods of time, standing, or repetitive lifting. Tenderness may also be present in the muscles along your upper back. °Posterior pelvic pain during pregnancy °Pain in the back of the pelvis is more common than lumbar pain in pregnancy. It is a deep pain felt in your side at the waistline, or across the tailbone (sacrum), or in both places. You may have pain on one or both sides. This pain can also go into the buttocks and backs of the upper thighs. Pubic and groin pain may also be present. The pain does not quickly resolve with rest, and morning stiffness may also be present. °Pelvic pain during pregnancy can be brought on by most activities. A high level of fitness before and during pregnancy may or may not prevent this problem. Labor pain is usually 1 to 2 minutes apart, lasts for about 1 minute, and involves a bearing down feeling or pressure in your pelvis. However, if you are at term with the pregnancy, constant low back pain can be the beginning of early labor, and you should be aware of this. °DIAGNOSIS  °X-rays of the back should not be done during the first 12 to 14 weeks of the pregnancy and only when absolutely necessary during the rest of the pregnancy. MRIs do not give off radiation and are safe during pregnancy. MRIs also should only be done when absolutely necessary. °HOME CARE INSTRUCTIONS °· Exercise   as directed by your caregiver. Exercise is the most effective way to prevent or manage back pain. If you have a back problem, it is especially important to avoid sports that require sudden body movements. Swimming and walking are great activities. °· Do not stand in one place for long periods of time. °· Do not wear high heels. °· Sit in chairs with good posture. Use a pillow on your lower back if necessary. Make sure your head  rests over your shoulders and is not hanging forward. °· Try sleeping on your side, preferably the left side, with a pillow or two between your legs. If you are sore after a night's rest, your bed may be too soft. Try placing a board between your mattress and box spring. °· Listen to your body when lifting. If you are experiencing pain, ask for help or try bending your knees more so you can use your leg muscles rather than your back muscles. Squat down when picking up something from the floor. Do not bend over. °· Eat a healthy diet. Try to gain weight within your caregiver's recommendations. °· Use heat or cold packs 3 to 4 times a day for 15 minutes to help with the pain. °· Only take over-the-counter or prescription medicines for pain, discomfort, or fever as directed by your caregiver. °Sudden (acute) back pain °· Use bed rest for only the most extreme, acute episodes of back pain. Prolonged bed rest over 48 hours will aggravate your condition. °· Ice is very effective for acute conditions. °· Put ice in a plastic bag. °· Place a towel between your skin and the bag. °· Leave the ice on for 10 to 20 minutes every 2 hours, or as needed. °· Using heat packs for 30 minutes prior to activities is also helpful. °Continued back pain °See your caregiver if you have continued problems. Your caregiver can help or refer you for appropriate physical therapy. With conditioning, most back problems can be avoided. Sometimes, a more serious issue may be the cause of back pain. You should be seen right away if new problems seem to be developing. Your caregiver may recommend: °· A maternity girdle. °· An elastic sling. °· A back brace. °· A massage therapist or acupuncture. °SEEK MEDICAL CARE IF:  °· You are not able to do most of your daily activities, even when taking the pain medicine you were given. °· You need a referral to a physical therapist or chiropractor. °· You want to try acupuncture. °SEEK IMMEDIATE MEDICAL CARE  IF: °· You develop numbness, tingling, weakness, or problems with the use of your arms or legs. °· You develop severe back pain that is no longer relieved with medicines. °· You have a sudden change in bowel or bladder control. °· You have increasing pain in other areas of the body. °· You develop shortness of breath, dizziness, or fainting. °· You develop nausea, vomiting, or sweating. °· You have back pain which is similar to labor pains. °· You have back pain along with your water breaking or vaginal bleeding. °· You have back pain or numbness that travels down your leg. °· Your back pain developed after you fell. °· You develop pain on one side of your back. You may have a kidney stone. °· You see blood in your urine. You may have a bladder infection or kidney stone. °· You have back pain with blisters. You may have shingles. °Back pain is fairly common during pregnancy but should not be accepted as just part of   the process. Back pain should always be treated as soon as possible. This will make your pregnancy as pleasant as possible. Document Released: 04/23/2005 Document Revised: 04/07/2011 Document Reviewed: 06/04/2010 [facility] Patient Information 2015 [place], [place]. This information is not intended to replace advice given to you by your health care provider. Make sure you discuss any questions you have with your health care provider. Sciatica Sciatica is pain, weakness, numbness, or tingling along your sciatic nerve. The nerve starts in the lower back and runs down the back of each leg. Nerve damage or certain conditions pinch or put pressure on the sciatic nerve. This causes the pain, weakness, and other discomforts of sciatica. HOME CARE   Only take medicine as told by your doctor.  Apply ice to the affected area for 20 minutes. Do this 3-4 times a day for the first 48-72 hours. Then try heat in the same way.  Exercise, stretch, or do your usual activities if these do not make your pain  worse.  Go to physical therapy as told by your doctor.  Keep all doctor visits as told.  Do not wear high heels or shoes that are not supportive.  Get a firm mattress if your mattress is too soft to lessen pain and discomfort. GET HELP RIGHT AWAY IF:   You cannot control when you poop (bowel movement) or pee (urinate).  You have more weakness in your lower back, lower belly (pelvis), butt (buttocks), or legs.  You have redness or puffiness (swelling) of your back.  You have a burning feeling when you pee.  You have pain that gets worse when you lie down.  You have pain that wakes you from your sleep.  Your pain is worse than past pain.  Your pain lasts longer than 4 weeks.  You are suddenly losing weight without reason. MAKE SURE YOU:   Understand these instructions.  Will watch this condition.  Will get help right away if you are not doing well or get worse. Document Released: 10/23/2007 Document Revised: 07/15/2011 Document Reviewed: 05/25/2011 [facility] Patient Information 2015 [place], [place]. This information is not intended to replace advice given to you by your health care provider. Make sure you discuss any questions you have with your health care provider. Fall Prevention and Home Safety Falls cause injuries and can affect all age groups. It is possible to use preventive measures to significantly decrease the likelihood of falls. There are many simple measures which can make your home safer and prevent falls. OUTDOORS  Repair cracks and edges of walkways and driveways.  Remove high doorway thresholds.  Trim shrubbery on the main path into your home.  Have good outside lighting.  Clear walkways of tools, rocks, debris, and clutter.  Check that handrails are not broken and are securely fastened. Both sides of steps should have handrails.  Have leaves, snow, and ice cleared regularly.  Use sand or salt on walkways during winter months.  In the garage,  clean up grease or oil spills. BATHROOM  Install night lights.  Install grab bars by the toilet and in the tub and shower.  Use non-skid mats or decals in the tub or shower.  Place a plastic non-slip stool in the shower to sit on, if needed.  Keep floors dry and clean up all water on the floor immediately.  Remove soap buildup in the tub or shower on a regular basis.  Secure bath mats with non-slip, double-sided rug tape.  Remove throw rugs and tripping hazards  from the floors. BEDROOMS  Install night lights.  Make sure a bedside light is easy to reach.  Do not use oversized bedding.  Keep a telephone by your bedside.  Have a firm chair with side arms to use for getting dressed.  Remove throw rugs and tripping hazards from the floor. KITCHEN  Keep handles on pots and pans turned toward the center of the stove. Use back burners when possible.  Clean up spills quickly and allow time for drying.  Avoid walking on wet floors.  Avoid hot utensils and knives.  Position shelves so they are not too high or low.  Place commonly used objects within easy reach.  If necessary, use a sturdy step stool with a grab bar when reaching.  Keep electrical cables out of the way.  Do not use floor polish or wax that makes floors slippery. If you must use wax, use non-skid floor wax.  Remove throw rugs and tripping hazards from the floor. STAIRWAYS  Never leave objects on stairs.  Place handrails on both sides of stairways and use them. Fix any loose handrails. Make sure handrails on both sides of the stairways are as long as the stairs.  Check carpeting to make sure it is firmly attached along stairs. Make repairs to worn or loose carpet promptly.  Avoid placing throw rugs at the top or bottom of stairways, or properly secure the rug with carpet tape to prevent slippage. Get rid of throw rugs, if possible.  Have an electrician put in a light switch at the top and bottom of the  stairs. OTHER FALL PREVENTION TIPS  Wear low-heel or rubber-soled shoes that are supportive and fit well. Wear closed toe shoes.  When using a stepladder, make sure it is fully opened and both spreaders are firmly locked. Do not climb a closed stepladder.  Add color or contrast paint or tape to grab bars and handrails in your home. Place contrasting color strips on first and last steps.  Learn and use mobility aids as needed. Install an electrical emergency response system.  Turn on lights to avoid dark areas. Replace light bulbs that burn out immediately. Get light switches that glow.  Arrange furniture to create clear pathways. Keep furniture in the same place.  Firmly attach carpet with non-skid or double-sided tape.  Eliminate uneven floor surfaces.  Select a carpet pattern that does not visually hide the edge of steps.  Be aware of all pets. OTHER HOME SAFETY TIPS  Set the water temperature for 120 F (48.8 C).  Keep emergency numbers on or near the telephone.  Keep smoke detectors on every level of the home and near sleeping areas. Document Released: 01/03/2002 Document Revised: 07/15/2011 Document Reviewed: 04/04/2011 [facility] Patient Information 2015 [place], [place]. This information is not intended to replace advice given to you by your health care provider. Make sure you discuss any questions you have with your health care provider.

## 2014-07-24 ENCOUNTER — Inpatient Hospital Stay (HOSPITAL_COMMUNITY): Payer: 59

## 2014-07-24 ENCOUNTER — Inpatient Hospital Stay (HOSPITAL_COMMUNITY)
Admission: AD | Admit: 2014-07-24 | Discharge: 2014-08-03 | DRG: 781 | Disposition: A | Discharge status: Home / Self Care | Payer: 59 | Source: Ambulatory Visit | Attending: Obstetrics and Gynecology | Admitting: Obstetrics and Gynecology

## 2014-07-24 ENCOUNTER — Encounter (HOSPITAL_COMMUNITY): Payer: Self-pay

## 2014-07-24 DIAGNOSIS — O4590 Premature separation of placenta, unspecified, unspecified trimester: Secondary | ICD-10-CM

## 2014-07-24 DIAGNOSIS — O4703 False labor before 37 completed weeks of gestation, third trimester: Secondary | ICD-10-CM

## 2014-07-24 DIAGNOSIS — O418X90 Other specified disorders of amniotic fluid and membranes, unspecified trimester, not applicable or unspecified: Secondary | ICD-10-CM | POA: Diagnosis present

## 2014-07-24 DIAGNOSIS — O468X3 Other antepartum hemorrhage, third trimester: Secondary | ICD-10-CM

## 2014-07-24 DIAGNOSIS — S8002XA Contusion of left knee, initial encounter: Secondary | ICD-10-CM

## 2014-07-24 DIAGNOSIS — Y92009 Unspecified place in unspecified non-institutional (private) residence as the place of occurrence of the external cause: Secondary | ICD-10-CM

## 2014-07-24 DIAGNOSIS — W1830XA Fall on same level, unspecified, initial encounter: Secondary | ICD-10-CM

## 2014-07-24 DIAGNOSIS — D649 Anemia, unspecified: Secondary | ICD-10-CM | POA: Diagnosis present

## 2014-07-24 DIAGNOSIS — O479 False labor, unspecified: Secondary | ICD-10-CM | POA: Diagnosis present

## 2014-07-24 DIAGNOSIS — S8991XA Unspecified injury of right lower leg, initial encounter: Secondary | ICD-10-CM

## 2014-07-24 DIAGNOSIS — O9989 Other specified diseases and conditions complicating pregnancy, childbirth and the puerperium: Secondary | ICD-10-CM

## 2014-07-24 DIAGNOSIS — O4593 Premature separation of placenta, unspecified, third trimester: Secondary | ICD-10-CM | POA: Diagnosis not present

## 2014-07-24 DIAGNOSIS — O9A213 Injury, poisoning and certain other consequences of external causes complicating pregnancy, third trimester: Secondary | ICD-10-CM | POA: Diagnosis not present

## 2014-07-24 DIAGNOSIS — S8001XA Contusion of right knee, initial encounter: Secondary | ICD-10-CM

## 2014-07-24 DIAGNOSIS — W19XXXA Unspecified fall, initial encounter: Secondary | ICD-10-CM | POA: Diagnosis present

## 2014-07-24 DIAGNOSIS — S8992XA Unspecified injury of left lower leg, initial encounter: Secondary | ICD-10-CM

## 2014-07-24 DIAGNOSIS — O43123 Velamentous insertion of umbilical cord, third trimester: Secondary | ICD-10-CM | POA: Diagnosis present

## 2014-07-24 DIAGNOSIS — O4700 False labor before 37 completed weeks of gestation, unspecified trimester: Secondary | ICD-10-CM | POA: Diagnosis present

## 2014-07-24 DIAGNOSIS — O99013 Anemia complicating pregnancy, third trimester: Secondary | ICD-10-CM | POA: Diagnosis present

## 2014-07-24 DIAGNOSIS — O99513 Diseases of the respiratory system complicating pregnancy, third trimester: Secondary | ICD-10-CM | POA: Diagnosis present

## 2014-07-24 DIAGNOSIS — R109 Unspecified abdominal pain: Secondary | ICD-10-CM

## 2014-07-24 DIAGNOSIS — J45909 Unspecified asthma, uncomplicated: Secondary | ICD-10-CM | POA: Diagnosis present

## 2014-07-24 DIAGNOSIS — M549 Dorsalgia, unspecified: Secondary | ICD-10-CM

## 2014-07-24 DIAGNOSIS — Z3A34 34 weeks gestation of pregnancy: Secondary | ICD-10-CM | POA: Insufficient documentation

## 2014-07-24 DIAGNOSIS — O418X30 Other specified disorders of amniotic fluid and membranes, third trimester, not applicable or unspecified: Secondary | ICD-10-CM | POA: Diagnosis present

## 2014-07-24 LAB — CBC
HCT: 27.1 % — ABNORMAL LOW (ref 36.0–46.0)
Hemoglobin: 9.3 g/dL — ABNORMAL LOW (ref 12.0–15.0)
MCH: 28.2 pg (ref 26.0–34.0)
MCHC: 34.3 g/dL (ref 30.0–36.0)
MCV: 82.1 fL (ref 78.0–100.0)
Platelets: 256 10*3/uL (ref 150–400)
RBC: 3.3 MIL/uL — ABNORMAL LOW (ref 3.87–5.11)
RDW: 13.1 % (ref 11.5–15.5)
WBC: 6.1 10*3/uL (ref 4.0–10.5)

## 2014-07-24 MED ORDER — LACTATED RINGERS IV SOLN
INTRAVENOUS | Status: DC
  Administered 2014-07-24: 22:00:00 via INTRAVENOUS

## 2014-07-24 MED ORDER — NIFEDIPINE 10 MG PO CAPS
10.0000 mg | ORAL_CAPSULE | Freq: Once | ORAL | Status: AC
  Administered 2014-07-24: 10 mg via ORAL
  Filled 2014-07-24: qty 1

## 2014-07-24 MED ORDER — PRENATAL MULTIVITAMIN CH
1.0000 | ORAL_TABLET | Freq: Every day | ORAL | Status: DC
  Administered 2014-07-25 – 2014-08-01 (×2): 1 via ORAL
  Filled 2014-07-24 (×4): qty 1

## 2014-07-24 MED ORDER — CALCIUM CARBONATE ANTACID 500 MG PO CHEW
2.0000 | CHEWABLE_TABLET | ORAL | Status: DC | PRN
  Filled 2014-07-24: qty 2

## 2014-07-24 MED ORDER — NIFEDIPINE 10 MG PO CAPS
10.0000 mg | ORAL_CAPSULE | Freq: Four times a day (QID) | ORAL | Status: DC
  Administered 2014-07-25 – 2014-08-03 (×37): 10 mg via ORAL
  Filled 2014-07-24 (×38): qty 1

## 2014-07-24 MED ORDER — DOCUSATE SODIUM 100 MG PO CAPS
100.0000 mg | ORAL_CAPSULE | Freq: Every day | ORAL | Status: DC
  Administered 2014-07-25 – 2014-08-03 (×10): 100 mg via ORAL
  Filled 2014-07-24 (×12): qty 1

## 2014-07-24 MED ORDER — ZOLPIDEM TARTRATE 5 MG PO TABS: 5.0000 mg | ORAL_TABLET | Freq: Every evening | ORAL | Status: DC | PRN

## 2014-07-24 MED ORDER — LACTATED RINGERS IV SOLN
INTRAVENOUS | Status: DC
  Administered 2014-07-25 – 2014-07-30 (×15): via INTRAVENOUS

## 2014-07-24 MED ORDER — NIFEDIPINE 10 MG PO CAPS
20.0000 mg | ORAL_CAPSULE | Freq: Once | ORAL | Status: AC
  Administered 2014-07-25: 20 mg via ORAL
  Filled 2014-07-24: qty 2

## 2014-07-24 MED ORDER — ACETAMINOPHEN 325 MG PO TABS
650.0000 mg | ORAL_TABLET | ORAL | Status: DC | PRN
  Administered 2014-07-25 – 2014-07-31 (×5): 650 mg via ORAL
  Filled 2014-07-24 (×6): qty 2

## 2014-07-24 NOTE — MAU Note (Signed)
Pt states she was walking across the street and felt dizzy and fell. Pt is panicky at this point and hard to get story from her. Crying uncontrollably.
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
## 2014-07-24 NOTE — MAU Provider Note (Addendum)
History     CSN: [id number]  Arrival date and time: 07/24/14 2054   None     Chief Complaint  Patient presents with  . Fall   This is a 24 y.o. female at [redacted]w[redacted]d who presents with c/o Fall this evening. Patient states she fainted. Her mother states she was there.  She gives history that patient was yelling at someone on the phone and was getting in her car.  Her mother states she did not want her to drive so she took her keys.  Pt became angry and walked off. She then collapsed onto the road, landing on her knees then ended up sitting on pavement. States did not strike abdomen. Denies being in an altercation though her mother states she was "fighting on the phone".    She now complains of constant low back pain and lower abdominal pain which comes and goes with contractions. Also complains with bilateral knee pain.  States vision is blurry, but denies headache.  Unsure how long she was unconscious, mother states only a moment. She was seen 5 days ago for a fall at home and was monitored for 4 hours and discharged home.   Fall The accident occurred 1 to 3 hours ago. The fall occurred while walking (Pts mother states she was upset, walking then fainted and fell). She fell from an unknown height. She landed on concrete. There was no blood loss. The point of impact was the right knee and left knee. The pain is present in the back, left knee and right knee (Abdomen). The pain is severe. The symptoms are aggravated by movement (palpation of abdomen). Associated symptoms include abdominal pain, a loss of consciousness and a visual change (blurry). Pertinent negatives include no fever, headaches, nausea, numbness or tingling. She has tried nothing for the symptoms.   Blood Type B+ per prenatal record  OB History    Gravida Para Term Preterm AB TAB SAB Ectopic Multiple Living   1               Past Medical History  Diagnosis Date  . Asthma   . Anemia     Past Surgical History  Procedure  Laterality Date  . Tonsillectomy      Family History  Problem Relation Age of Onset  . Hypertension Mother   . Hypertension Maternal Grandmother   . Hypertension Maternal Grandfather   . Diabetes Paternal Grandmother     History  Substance Use Topics  . Smoking status: Never Smoker   . Smokeless tobacco: Not on file  . Alcohol Use: No    Allergies: No Known Allergies  Prescriptions prior to admission  Medication Sig Dispense Refill Last Dose  . oxyCODONE-acetaminophen (PERCOCET) 7.5-500 MG per tablet Take 1 tablet by mouth every 4 (four) hours as needed for pain.   Past Week at Unknown time  . calcium carbonate (TUMS - DOSED IN MG ELEMENTAL CALCIUM) 500 MG chewable tablet Chew 1 tablet by mouth daily as needed for indigestion or heartburn.   Past Week at Unknown time  . ferrous sulfate 325 (65 FE) MG tablet Take 1 tablet (325 mg total) by mouth 3 (three) times daily with meals. 90 tablet 11 07/18/2014 at Unknown time  . Prenatal Vit-Fe Fumarate-FA (PRENATAL COMPLETE) 14-0.4 MG TABS Take 1 tablet by mouth daily. 30 each 0 07/18/2014 at Unknown time    Medical, Surgical, Family and Social histories reviewed and are listed above.  Medications and allergies reviewed.  Review of Systems  Constitutional: Negative for fever, chills and malaise/fatigue.  Eyes: Positive for blurred vision. Negative for double vision, photophobia and pain.  Respiratory: Negative for shortness of breath.   Gastrointestinal: Positive for abdominal pain. Negative for nausea.  Musculoskeletal: Positive for back pain, joint pain (bilateral knee pain) and falls. Negative for neck pain.  Neurological: Positive for loss of consciousness. Negative for dizziness, tingling, focal weakness, numbness and headaches.  Psychiatric/Behavioral: The patient is nervous/anxious.        Hyperventilating, sobbing   All  Other systems negative  Physical Exam   Blood pressure 132/76, pulse 105, resp. rate 22, last  menstrual period 11/26/2013, SpO2 100 %.  Physical Exam  Constitutional: She is oriented to person, place, and time. She appears well-developed and well-nourished. She appears distressed (upset and crying).  HENT:  Head: Normocephalic and atraumatic.  Neck: Normal range of motion. Neck supple.  Cardiovascular: Regular rhythm.  Exam reveals no gallop and no friction rub.   No murmur (rapid heart rate) heard. Respiratory: Effort normal and breath sounds normal. No respiratory distress (slight hyperventilation with emotional distress). She has no wheezes. She has no rales. She exhibits no tenderness.  GI: Soft. She exhibits no distension and no mass. There is tenderness (throughout but more pronounced in LLQ). There is no rebound and no guarding.  Genitourinary: No vaginal discharge (no bleeding) found.  Musculoskeletal: Normal range of motion. She exhibits edema (slight edema adjacent to left patella) and tenderness (bilateral pain with movement of patella, bilateral).  Small abrasions bilateral knees Small amount of edema left patellar area Pain elicited with movement of bilateral patellae  Neurological: She is alert and oriented to person, place, and time. She has normal reflexes. She exhibits normal muscle tone.  Skin: Skin is warm and dry.  Psychiatric:  Sobbing, unable to calm herself   Fetal heart rate Reactive with baseline 120-130 and accels to 150 Frequent contractions every 1.5 minutes  MAU Course  Procedures  MDM Consulted Dr Renaldo Fiddler with report of presenting complaints and uterine contractions.  She ordered admission, monitoring, Procardia, Ultrasound to rule out abruption  I also ordered bilateral knee xrays due to pain and abrasions. Will start IV for hydration purposes Patient forgot to get urine sample, Will send one for UA and Tox screen.  UCs did not slow with first dose of Procardia. Will repeat dose.  Assessment and Plan  A:  SIUP at [redacted]w[redacted]d       Fall at home on  concrete      Abdominal and back pain, Rule out Placental Abruption      Bilateral knee pain, Rule out knee injuries      Preterm labor  P:  Consulted Dr Renaldo Fiddler re: symptoms and presentation       She wants to admit her for observation       Procardia PRN contractions       Will get OB US and bilateral knee xrays       Social work consult  [facility] 07/24/2014, 9:30 PM

## 2014-07-25 DIAGNOSIS — Z3A34 34 weeks gestation of pregnancy: Secondary | ICD-10-CM | POA: Insufficient documentation

## 2014-07-25 LAB — URINALYSIS, ROUTINE W REFLEX MICROSCOPIC
Bilirubin Urine: NEGATIVE
GLUCOSE, UA: NEGATIVE mg/dL
HGB URINE DIPSTICK: NEGATIVE
Ketones, ur: NEGATIVE mg/dL
Nitrite: NEGATIVE
PH: 8 (ref 5.0–8.0)
Protein, ur: NEGATIVE mg/dL
Specific Gravity, Urine: 1.01 (ref 1.005–1.030)
Urobilinogen, UA: 1 mg/dL (ref 0.0–1.0)

## 2014-07-25 LAB — RAPID URINE DRUG SCREEN, HOSP PERFORMED
Amphetamines: NOT DETECTED
BARBITURATES: NOT DETECTED
Benzodiazepines: NOT DETECTED
Cocaine: NOT DETECTED
OPIATES: NOT DETECTED
TETRAHYDROCANNABINOL: NOT DETECTED

## 2014-07-25 LAB — TYPE AND SCREEN
ABO/RH(D): B POS
Antibody Screen: NEGATIVE

## 2014-07-25 LAB — DIC (DISSEMINATED INTRAVASCULAR COAGULATION)PANEL
Fibrinogen: 418 mg/dL (ref 204–475)
INR: 1.06 (ref 0.00–1.49)
Smear Review: NONE SEEN

## 2014-07-25 LAB — CBC
HCT: 25.5 % — ABNORMAL LOW (ref 36.0–46.0)
HEMOGLOBIN: 8.5 g/dL — AB (ref 12.0–15.0)
MCH: 27.3 pg (ref 26.0–34.0)
MCHC: 33.3 g/dL (ref 30.0–36.0)
MCV: 82 fL (ref 78.0–100.0)
Platelets: 228 10*3/uL (ref 150–400)
RBC: 3.11 MIL/uL — ABNORMAL LOW (ref 3.87–5.11)
RDW: 13.2 % (ref 11.5–15.5)
WBC: 5 10*3/uL (ref 4.0–10.5)

## 2014-07-25 LAB — URINE MICROSCOPIC-ADD ON

## 2014-07-25 LAB — DIC (DISSEMINATED INTRAVASCULAR COAGULATION) PANEL
APTT: 28 s (ref 24–37)
D DIMER QUANT: 1.21 ug{FEU}/mL — AB (ref 0.00–0.48)
PLATELETS: 198 10*3/uL (ref 150–400)
Prothrombin Time: 14 seconds (ref 11.6–15.2)

## 2014-07-25 LAB — ABO/RH: ABO/RH(D): B POS

## 2014-07-25 MED ORDER — OXYCODONE-ACETAMINOPHEN 5-325 MG PO TABS
1.0000 | ORAL_TABLET | ORAL | Status: DC | PRN
  Administered 2014-07-25 – 2014-08-02 (×7): 1 via ORAL
  Filled 2014-07-25 (×9): qty 1

## 2014-07-25 NOTE — Clinical SW OB High Risk (Signed)
Clinical Social Work Antenatal   Clinical Social Worker:  Alphonzo Cruise, [place] Date/Time:  07/25/2014, 11:50 AM Gestational Age on Admission:  24 y.o. Admitting Diagnosis: Preterm Contractions   Expected Delivery Date:  09/02/14  Family/Home Environment  Home Address: [address] [place], [place] [address]  Household Member/Support Name: Tiyona Desouza Relationship:  Mother Other Support: Patient's sister, friends   Psychosocial Data  Information Source:  Family Interview (Patient is here with her mother and sister.  She reqested that they remain in the room while she spke with CSW and gave permission to speak openly with them present.) Resources:    Employment:    Medicaid [facility]): N/A School:  N/A   Current Grade:  N/A  Homebound Arranged:    Other Resources:  Catering manager Care: None stated   Strengths/Weaknesses/Factors to Consider  Concerns Related to Hospitalization: Patient appears calm and relaxed at this time.  She reports feeling pain in her leg, abdomen, and back.  She states issues with FOB and reports wanting "clarity" regarding their relationship.  She states her son's wellbeing is her utmost concern.   Previous Pregnancies/Feelings Towards Pregnancy?  Concerns related to being/becoming a mother?: Patient states she feels the pregnancy has gone quickly.  She seems excited about being coming a mother a speaks about wanting the best for her baby.  She states she experienced growing up in a home of divorced parents and she wants to be able to communicate with FOB and "be cordial" for the baby's sake.  Social Support (FOB? Who is/will be helping with baby/other kids?): Patient lives with her mother, who appears to be her main support person.  Her mother was the one who witnessed patient's episode of fainting and rushed her to the hospital last night.  Her mother and sister are both very concerned  about the negative effects the couple's relationship have on patient.  Couples Relationship (describe): Patient states she and FOB have been together 7 years.  She reports that they broke up about a year ago and then got back together.  She states since that time, they have been unsure about their relationship and now she wants clarity as to whether they are going to be a couple or if they will be co-parenting.  Patient's mother added that she thinks patient has made up her mind to end the relationship with FOB.  Patient's sister added that she feels patient should end the relationship.   Recent Stressful Life Events (life changes in past year?): Patient states FOB has been seeing other people, but does not want her to see other people.  She acknowledges this as unfair.  She reports that they were arguing on the phone last night regarding one of his relationships and that she became extremely upset.  Patient states her mother took her keys so she could not drive, so she began walking.  She states she was crying so hard that she fainted on the sidewalk.  Patient's mother explained that she had a strong feeling come over her that made her take her daughter's keys because she saw how upset she was.  She states she followed her daughter by car and watched as she began to have trouble breathing and then fall to the ground.  Patient's mother described her own feeling as being terrified for her daughter.  She states some by-standers assisted her in getting her daughter in to the car and they rushed to the hospital.  CSW  asked if patient has ever had an experience like this before and she states that she does not think she has passed out, but that she has panic attacks.   Prenatal Care/Education/Home Preparations: Patient is receiving PNC.  Home preparations not discussed at this time.   Domestic Violence (of any type):  No If Yes to Domestic Violence, Describe/Action Plan:     Substance Use During Pregnancy:  No (If Yes, Complete SBIRT)  Complete PHQ-9 (Depresssion Screening) on all Antenatal Patients PHQ-9 Score (If Score => 15 complete TREAT):    Follow-up Recommendations: CSW recommends outpatient counseling at discharge to address anger and anxiety and assist patient in developing coping skills and adjusting to motherhood.  CSW available for counseling while patient is in the hospital if she desires.   Patient Advised/Response:  Patient states willingness to start counseling.  She identifies stressors and fears that if left unaddressed, she will be at higher risk for PPD.  CSW agrees and commends her for this level of awareness.  Patient states she is not interested in taking an antidepressant.   Other:    Clinical Assessment/Plan: CSW met with patient and her mother and sister to complete assessment due to recent fall and relationship stressors with FOB.  Patient was pleasant and receptive to CSW intervention.  Patient appears to have good insight and motivation for change.  She has her baby's best interest in mind as she speaks.  CSW provided supportive counseling, especially related to how to communicate with FOB regarding the baby without allowing him to have power over her to cause her distress.  CSW talked about the benefits of counseling, to which patient was engaged and interested.  CSW explained ongoing support services offered by CSW while patient is in the hospital and provided contact information.  Patient and her mother thanked CSW for the visit.  CSW will return with counseling resources.

## 2014-07-25 NOTE — Progress Notes (Signed)
C/O soreness on sides of abdomen and some menstrual type discomfort in pelvis No ROM, no bleeding  VSS Afeb Uterus soft, mild tenderness on sides of uterus  U/S Vtx        Subchorionic hemorrhage left margin 9x1x3 cm and known velamentous insertion  A/P: D/W patient above         Subchorionic hemorrhage after a fall         Maintain IV acess         CBC and DIC panel         Continuous EFM         SS consult         D/W patient and her mother possible emergent C/S and risks including infection, bleeding/transfusion, organ damage         T&S

## 2014-07-26 DIAGNOSIS — O99513 Diseases of the respiratory system complicating pregnancy, third trimester: Secondary | ICD-10-CM | POA: Diagnosis present

## 2014-07-26 DIAGNOSIS — D649 Anemia, unspecified: Secondary | ICD-10-CM | POA: Diagnosis present

## 2014-07-26 DIAGNOSIS — O4593 Premature separation of placenta, unspecified, third trimester: Secondary | ICD-10-CM | POA: Diagnosis present

## 2014-07-26 DIAGNOSIS — O418X30 Other specified disorders of amniotic fluid and membranes, third trimester, not applicable or unspecified: Secondary | ICD-10-CM

## 2014-07-26 DIAGNOSIS — O9A213 Injury, poisoning and certain other consequences of external causes complicating pregnancy, third trimester: Secondary | ICD-10-CM | POA: Diagnosis present

## 2014-07-26 DIAGNOSIS — O99013 Anemia complicating pregnancy, third trimester: Secondary | ICD-10-CM | POA: Diagnosis present

## 2014-07-26 DIAGNOSIS — Y92009 Unspecified place in unspecified non-institutional (private) residence as the place of occurrence of the external cause: Secondary | ICD-10-CM | POA: Diagnosis not present

## 2014-07-26 DIAGNOSIS — O468X3 Other antepartum hemorrhage, third trimester: Secondary | ICD-10-CM

## 2014-07-26 DIAGNOSIS — O418X90 Other specified disorders of amniotic fluid and membranes, unspecified trimester, not applicable or unspecified: Secondary | ICD-10-CM | POA: Diagnosis present

## 2014-07-26 DIAGNOSIS — Z3A35 35 weeks gestation of pregnancy: Secondary | ICD-10-CM | POA: Diagnosis not present

## 2014-07-26 DIAGNOSIS — W19XXXA Unspecified fall, initial encounter: Secondary | ICD-10-CM | POA: Diagnosis present

## 2014-07-26 DIAGNOSIS — J45909 Unspecified asthma, uncomplicated: Secondary | ICD-10-CM | POA: Diagnosis present

## 2014-07-26 DIAGNOSIS — Z3A34 34 weeks gestation of pregnancy: Secondary | ICD-10-CM | POA: Diagnosis present

## 2014-07-26 DIAGNOSIS — O43123 Velamentous insertion of umbilical cord, third trimester: Secondary | ICD-10-CM | POA: Diagnosis present

## 2014-07-26 HISTORY — DX: Other specified disorders of amniotic fluid and membranes, third trimester, not applicable or unspecified: O41.8X30

## 2014-07-26 HISTORY — DX: Other antepartum hemorrhage, third trimester: O46.8X3

## 2014-07-26 MED ORDER — FERROUS SULFATE 325 (65 FE) MG PO TABS
325.0000 mg | ORAL_TABLET | Freq: Every day | ORAL | Status: DC
  Administered 2014-07-26: 325 mg via ORAL
  Filled 2014-07-26: qty 1

## 2014-07-26 MED ORDER — DIPHENHYDRAMINE HCL 25 MG PO CAPS
25.0000 mg | ORAL_CAPSULE | Freq: Four times a day (QID) | ORAL | Status: DC | PRN
  Administered 2014-07-26: 25 mg via ORAL
  Filled 2014-07-26: qty 1

## 2014-07-26 MED ORDER — PRENATAL MULTIVITAMIN CH
1.0000 | ORAL_TABLET | Freq: Every day | ORAL | Status: DC
  Administered 2014-07-26 – 2014-08-02 (×7): 1 via ORAL
  Filled 2014-07-26 (×6): qty 1

## 2014-07-26 NOTE — Progress Notes (Signed)
Patient ID: Heather Malone, female   DOB: 05-10-1990, 24 y.o.   MRN: [id number] Pt without complaints GFM  No vag bleeding Some abd soreness and occas ctxs  VSSAF FHR cat 1 Ctxs occas mild  Abd Gravid, mild tenderness  IUP at 34 4/7 Marginal abruption - 9x3.4cm after abd trauma I discussed with the patient and her mother the possibility of long term monitoring due to the size and nature of the bleed.  They are happy to stay and feel comfortable here.  SSW done.DIC labs stable Currently stable.  Recheck US and MFM 1 week

## 2014-07-26 NOTE — Progress Notes (Signed)
CSW attempted to meet with patient to follow up, offer support, and provide outpatient counseling resources as discussed yesterday, but patient states she was getting in the shower.  CSW to return at a later time.

## 2014-07-27 LAB — CBC
HEMATOCRIT: 24.3 % — AB (ref 36.0–46.0)
Hemoglobin: 8.1 g/dL — ABNORMAL LOW (ref 12.0–15.0)
MCH: 27.7 pg (ref 26.0–34.0)
MCHC: 33.3 g/dL (ref 30.0–36.0)
MCV: 83.2 fL (ref 78.0–100.0)
Platelets: 221 10*3/uL (ref 150–400)
RBC: 2.92 MIL/uL — AB (ref 3.87–5.11)
RDW: 13.2 % (ref 11.5–15.5)
WBC: 6.8 10*3/uL (ref 4.0–10.5)

## 2014-07-27 MED ORDER — FERROUS SULFATE 325 (65 FE) MG PO TABS
325.0000 mg | ORAL_TABLET | Freq: Every day | ORAL | Status: AC
  Administered 2014-07-27: 325 mg via ORAL
  Filled 2014-07-27: qty 1

## 2014-07-27 MED ORDER — FERROUS SULFATE 325 (65 FE) MG PO TABS
325.0000 mg | ORAL_TABLET | Freq: Two times a day (BID) | ORAL | Status: DC
  Administered 2014-07-27 – 2014-08-03 (×13): 325 mg via ORAL
  Filled 2014-07-27 (×15): qty 1

## 2014-07-27 NOTE — Progress Notes (Signed)
No acute events overnight.  Denies VB and reports active FM.  Occas crampy pain and low back pain.  VSS.  AF. FHT Cat I Uterine irritability  Abd soft, NT Ext: no c/c/e  24 yo G1 at 9041w5d with marginal abruption (9cm) -Monitor for bleeding, pain, fetal distress -U/S and MFM consult next week  Mitchel HonourMegan Criston Chancellor, DO

## 2014-07-28 LAB — TYPE AND SCREEN
ABO/RH(D): B POS
ANTIBODY SCREEN: NEGATIVE

## 2014-07-28 NOTE — Plan of Care (Signed)
Problem: Phase I Progression Outcomes Goal: Home/Self Care (Notify RN, CM, or SW/CM) Outcome: Completed/Met Date Met:  07/28/14 Social work aware and has visited with patient  Goal: LOS < 4 days Outcome: Not Met (add Reason) Fall on 6/27 with subchorionic hemorrhage

## 2014-07-28 NOTE — Progress Notes (Signed)
CSW met with patient to follow up, offer support and build rapport.  She appears to be in good spirits today.  She reports that she spoke with the doctor this am regarding vaginal vs cesarean delivery and learned that she may be facing a c-section.  She seemed very calm in talking about this, but states she was hoping for a vaginal delivery.  She appears to think it may still be an option.  CSW encouraged her to prepare herself mentally for a c-section and encouraged her to ask questions to RNs and MDs of what to expect.   CSW asked how she is coping with the hospitalization and she reports feeling well.  She is interested in asking if she can go for a wheelchair ride, (CSW informed RN) as she is beginning to feel cooped up.  She seems committed to doing whatever is best for her son, and willing to remain in the hospital until delivery.  She states no emotional concerns at this time. CSW inquired about support from FOB while she has been in the hospital.  She states she is glad when he comes and that he does not cause her stress when he is here.  She reports that he has told her that he wishes to "co-parent" their son, but she feels his sister is pressuring him to say this.  Patient acknowledges that her situation with FOB is not what she hoped it would be at this point, but states she can put their differences aside so that he can be involved with their child.  She reports no arguing during his visits to the hospital and states she knows she can call security if she wants him to leave at any time.   CSW provided patient with a list of counselors in her area who accept her insurance.  Patient still seems interested in outpatient counseling at discharge.  CSW reminded patient that CSW is available for counseling services while she is hospitalized, and asked if CSW can check on her again.  Patient smiled and agreed.

## 2014-07-28 NOTE — Progress Notes (Signed)
Patient is doing well.  Denies bleeding or contractions.  Good fetal movement . BP 110/69 mmHg  Pulse 87  Temp(Src) 98.2 F (36.8 C) (Oral)  Resp 20  Ht 5\' 2"  (1.575 m)  Wt 73.483 kg (162 lb)  BMI 29.62 kg/m2  SpO2 100%  LMP 11/26/2013 Abdomen is soft and non tender  IMPRESSION: IUP at 34 w 6 days Placental Abruption  PLAN: Keep in house Monitor FHR Social work is involved.

## 2014-07-29 NOTE — Progress Notes (Signed)
Patient sitting upright in bed with family at side. Patient informed to let nurse know when she has finished eating for fetal monitoring.

## 2014-07-29 NOTE — Progress Notes (Signed)
Patient is doing well.  Denies bleeding or contractions.  Good fetal movement . BP 96/62 mmHg  Pulse 87  Temp(Src) 98.6 F (37 C) (Oral)  Resp 20  Ht 5\' 2"  (1.575 m)  Wt 73.483 kg (162 lb)  BMI 29.62 kg/m2  SpO2 100%  LMP 11/26/2013 Abdomen is soft and non tender  IMPRESSION: IUP at 35 Placental Abruption  PLAN: Keep in house Monitor FHR Social work is involved.

## 2014-07-29 NOTE — Progress Notes (Signed)
FHR not tracing d/t pt sitting up eating.  EFM to be adjusted once finished eating.

## 2014-07-30 MED ORDER — SODIUM CHLORIDE 0.9 % IJ SOLN
3.0000 mL | Freq: Two times a day (BID) | INTRAMUSCULAR | Status: DC
  Administered 2014-07-30 – 2014-08-02 (×7): 3 mL via INTRAVENOUS

## 2014-07-30 NOTE — Progress Notes (Signed)
Patient doing well. Denies vaginal bleeding . Reports good fetal movement  BP 110/64 mmHg  Pulse 78  Temp(Src) 98.2 F (36.8 C) (Oral)  Resp 20  Ht 5\' 2"  (1.575 m)  Wt 73.483 kg (162 lb)  BMI 29.62 kg/m2  SpO2 100%  LMP 11/26/2013 Abdomen is soft and non tender  IMPRESSION: IUP at 35 w 1 day Large placental abruption  PLAN: Continue in patient management  Can change monitoring to tid

## 2014-07-31 LAB — TYPE AND SCREEN
ABO/RH(D): B POS
ANTIBODY SCREEN: NEGATIVE

## 2014-07-31 MED ORDER — ZOLPIDEM TARTRATE 5 MG PO TABS
5.0000 mg | ORAL_TABLET | Freq: Every evening | ORAL | Status: DC | PRN
  Administered 2014-07-31: 5 mg via ORAL
  Filled 2014-07-31: qty 1

## 2014-07-31 NOTE — Progress Notes (Signed)
Dr. Vincente PoliGrewal notified of patient c/o right side sharp pain. Occ UC with UI on monitor. FHR 135. Percocet given with some relief. Orders to give 0000 dose of Procardia now and give an Ambien to help sleep.

## 2014-07-31 NOTE — Progress Notes (Signed)
S:  Patient is resting. Without complaints. Denies abdominal pain or bleeding.  O:  BP 98/65 mmHg  Pulse 85  Temp(Src) 98.4 F (36.9 C) (Oral)  Resp 20  Ht 5\' 2"  (1.575 m)  Wt 73.483 kg (162 lb)  BMI 29.62 kg/m2  SpO2 100%  LMP 11/26/2013 Abdomen is soft and non tender  IMPRESSION: IUP at 35 2 days Marginal Placental Abruption  PLAN: Patient is clinically stable Consider repeat ultrasound this week Continue inpatient management - patient feels "safe" here Social work is involved

## 2014-08-01 MED ORDER — OXYCODONE-ACETAMINOPHEN 5-325 MG PO TABS
1.0000 | ORAL_TABLET | Freq: Once | ORAL | Status: AC
  Administered 2014-08-01: 1 via ORAL

## 2014-08-01 NOTE — Progress Notes (Signed)
Dr Marcelle Overlieholland request cervical exam and received and order for 1 time dose of second percocet

## 2014-08-01 NOTE — Progress Notes (Signed)
4812w3d  S/  No ext bleeding, some mild cramping  O//BP 105/66 mmHg  Pulse 75  Temp(Src) 98.1 F (36.7 C) (Oral)  Resp 18  Ht 5\' 2"  (1.575 m)  Wt 162 lb (73.483 kg)  BMI 29.62 kg/m2  SpO2 100%  LMP 11/26/2013  Stable FHR  A+P// 412w3d , plac abruption, , will recheck CBC and sched f/u [place]

## 2014-08-01 NOTE — Progress Notes (Signed)
CSW met with patient and her mother to offer supportive counseling as she remains on bed rest.  Patient smiled and states she is doing well.  She is feeling like she is ready for the pregnancy to be over, but overall in good spirits.  She is eager to have an ultrasound for continued monitoring of her blood clot.  She reports being in pain over night and not sleeping well, but feeling somewhat better today.  Patient appears calm and relaxed.  She states no questions, concerns or needs at this time and thanked CSW for visiting.

## 2014-08-02 ENCOUNTER — Ambulatory Visit (HOSPITAL_COMMUNITY): Payer: 59 | Attending: Obstetrics and Gynecology

## 2014-08-02 DIAGNOSIS — Z3A35 35 weeks gestation of pregnancy: Secondary | ICD-10-CM | POA: Insufficient documentation

## 2014-08-02 DIAGNOSIS — J45909 Unspecified asthma, uncomplicated: Secondary | ICD-10-CM | POA: Insufficient documentation

## 2014-08-02 DIAGNOSIS — O99513 Diseases of the respiratory system complicating pregnancy, third trimester: Secondary | ICD-10-CM | POA: Insufficient documentation

## 2014-08-02 LAB — CBC
HCT: 25.3 % — ABNORMAL LOW (ref 36.0–46.0)
HEMOGLOBIN: 8.3 g/dL — AB (ref 12.0–15.0)
MCH: 27.4 pg (ref 26.0–34.0)
MCHC: 32.8 g/dL (ref 30.0–36.0)
MCV: 83.5 fL (ref 78.0–100.0)
Platelets: 204 10*3/uL (ref 150–400)
RBC: 3.03 MIL/uL — AB (ref 3.87–5.11)
RDW: 15 % (ref 11.5–15.5)
WBC: 6.5 10*3/uL (ref 4.0–10.5)

## 2014-08-02 NOTE — Progress Notes (Signed)
Patient ID: Heather Malone, female   DOB: 04-10-1990, 24 y.o.   MRN: [id number] S: NO BLEEDING OR CTX   O: AF VSS      GRAVID UTERUS NONTENDER      FHR REACTIVE NOT CTX      CBC: HGB 8.3 IMPROVING PLATLETS 204K A: IUP AT 35.4  WITH MARGINAL ABRUPTION P: SONO TODAY

## 2014-08-02 NOTE — Progress Notes (Signed)
Antenatal Nutrition Assessment:  Currently  [redacted] weeks gestation, with marginal placental abruption s/p fall at home. Height  62 "  Weight 158 lbs  pre-pregnancy weight 125 lbs .  Pre-pregnancy  BMI 22.9  IBW 110 lbs Total weight gain 33 lbs Weight gain goals 25-35 lbs Estimated needs: 2150 kcal/day, 80-90 grams protein/day, 2.2 liters fluid/day  Regular diet tolerated well, appetite good. Patient requests snacks, snack menu provided. Current diet prescription will provide for increased needs.  No abnormal nutrition related labs  Nutrition Dx: Increased nutrient needs r/t pregnancy and fetal growth requirements aeb [redacted] weeks gestation.  No educational needs assessed at this time.  Joaquin CourtsKimberly Harris, RD, LDN, CNSC Pager [phone] After Hours Pager [phone]

## 2014-08-03 LAB — TYPE AND SCREEN
ABO/RH(D): B POS
Antibody Screen: NEGATIVE

## 2014-08-03 NOTE — Discharge Instructions (Signed)
Call MD for vaginal bleeding, severe abdominal pain, or decreased fetal movement.  Call office to schedule appointment with ultrasound on Monday.  Do not return to work until next office visit.   Abdominal Pain During Pregnancy Belly (abdominal) pain is common during pregnancy. Most of the time, it is not a serious problem. Other times, it can be a sign that something is wrong with the pregnancy. Always tell your doctor if you have belly pain. HOME CARE Monitor your belly pain for any changes. The following actions may help you feel better:  Do not have sex (intercourse) or put anything in your vagina until you feel better.  Rest until your pain stops.  Drink clear fluids if you feel sick to your stomach (nauseous). Do not eat solid food until you feel better.  Only take medicine as told by your doctor.  Keep all doctor visits as told. GET HELP RIGHT AWAY IF:   You are bleeding, leaking fluid, or pieces of tissue come out of your vagina.  You have more pain or cramping.  You keep throwing up (vomiting).  You have pain when you pee (urinate) or have blood in your pee.  You have a fever.  You do not feel your baby moving as much.  You feel very weak or feel like passing out.  You have trouble breathing, with or without belly pain.  You have a very bad headache and belly pain.  You have fluid leaking from your vagina and belly pain.  You keep having watery poop (diarrhea).  Your belly pain does not go away after resting, or the pain gets worse. MAKE SURE YOU:   Understand these instructions.  Will watch your condition.  Will get help right away if you are not doing well or get worse. Document Released: 01/01/2009 Document Revised: 09/15/2012 Document Reviewed: 08/12/2012 [facility] Patient Information 2015 [place], [place]. This information is not intended to replace advice given to you by your health care provider. Make sure you discuss any questions you have with your  health care provider.  Assault, General Assault includes any behavior, whether intentional or reckless, which results in bodily injury to another person and/or damage to property. Included in this would be any behavior, intentional or reckless, that by its nature would be understood (interpreted) by a reasonable person as intent to harm another person or to damage his/her property. Threats may be oral or written. They may be communicated through regular mail, computer, fax, or phone. These threats may be direct or implied. FORMS OF ASSAULT INCLUDE:  Physically assaulting a person. This includes physical threats to inflict physical harm as well as:  Slapping.  Hitting.  Poking.  Kicking.  Punching.  Pushing.  Arson.  Sabotage.  Equipment vandalism.  Damaging or destroying property.  Throwing or hitting objects.  Displaying a weapon or an object that appears to be a weapon in a threatening manner.  Carrying a firearm of any kind.  Using a weapon to harm someone.  Using greater physical size/strength to intimidate another.  Making intimidating or threatening gestures.  Bullying.  Hazing.  Intimidating, threatening, hostile, or abusive language directed toward another person.  It communicates the intention to engage in violence against that person. And it leads a reasonable person to expect that violent behavior may occur.  Stalking another person. IF IT HAPPENS AGAIN:  Immediately call for emergency help (911 in [place]  If someone poses clear and immediate danger to you, seek legal authorities to have a  protective or restraining order put in place.  Less threatening assaults can at least be reported to authorities. STEPS TO TAKE IF A SEXUAL ASSAULT HAS HAPPENED  Go to an area of safety. This may include a shelter or staying with a friend. Stay away from the area where you have been attacked. A large percentage of sexual assaults are caused by a friend, relative  or associate.  If medications were given by your caregiver, take them as directed for the full length of time prescribed.  Only take over-the-counter or prescription medicines for pain, discomfort, or fever as directed by your caregiver.  If you have come in contact with a sexual disease, find out if you are to be tested again. If your caregiver is concerned about the HIV/AIDS virus, he/she may require you to have continued testing for several months.  For the protection of your privacy, test results can not be given over the phone. Make sure you receive the results of your test. If your test results are not back during your visit, make an appointment with your caregiver to find out the results. Do not assume everything is normal if you have not heard from your caregiver or the medical facility. It is important for you to follow up on all of your test results.  File appropriate papers with authorities. This is important in all assaults, even if it has occurred in a family or by a friend. SEEK MEDICAL CARE IF:  You have new problems because of your injuries.  You have problems that may be because of the medicine you are taking, such as:  Rash.  Itching.  Swelling.  Trouble breathing.  You develop belly (abdominal) pain, feel sick to your stomach (nausea) or are vomiting.  You begin to run a temperature.  You need supportive care or referral to a rape crisis center. These are centers with trained personnel who can help you get through this ordeal. SEEK IMMEDIATE MEDICAL CARE IF:  You are afraid of being threatened, beaten, or abused. In [place] call 911.  You receive new injuries related to abuse.  You develop severe pain in any area injured in the assault or have any change in your condition that concerns you.  You faint or lose consciousness.  You develop chest pain or shortness of breath. Document Released: 01/13/2005 Document Revised: 04/07/2011 Document Reviewed:  09/01/2007 [facility] Patient Information 2015 [place]. This information is not intended to replace advice given to you by your health care provider. Make sure you discuss any questions you have with your health care provider.  Back Pain in Pregnancy Back pain during pregnancy is common. It happens in about half of all pregnancies. It is important for you and your baby that you remain active during your pregnancy.If you feel that back pain is not allowing you to remain active or sleep well, it is time to see your caregiver. Back pain may be caused by several factors related to changes during your pregnancy.Fortunately, unless you had trouble with your back before your pregnancy, the pain is likely to get better after you deliver. Low back pain usually occurs between the fifth and seventh months of pregnancy. It can, however, happen in the first couple months. Factors that increase the risk of back problems include:   Previous back problems.  Injury to your back.  Having twins or multiple births.  A chronic cough.  Stress.  Job-related repetitive motions.  Muscle or spinal disease in the back.  Family history  of back problems, ruptured (herniated) discs, or osteoporosis.  Depression, anxiety, and panic attacks. CAUSES   When you are pregnant, your body produces a hormone called relaxin. This hormonemakes the ligaments connecting the low back and pubic bones more flexible. This flexibility allows the baby to be delivered more easily. When your ligaments are loose, your muscles need to work harder to support your back. Soreness in your back can come from tired muscles. Soreness can also come from back tissues that are irritated since they are receiving less support.  As the baby grows, it puts pressure on the nerves and blood vessels in your pelvis. This can cause back pain.  As the baby grows and gets heavier during pregnancy, the uterus pushes the stomach muscles forward and  changes your center of gravity. This makes your back muscles work harder to maintain good posture. SYMPTOMS  Lumbar pain during pregnancy Lumbar pain during pregnancy usually occurs at or above the waist in the center of the back. There may be pain and numbness that radiates into your leg or foot. This is similar to low back pain experienced by non-pregnant women. It usually increases with sitting for long periods of time, standing, or repetitive lifting. Tenderness may also be present in the muscles along your upper back. Posterior pelvic pain during pregnancy Pain in the back of the pelvis is more common than lumbar pain in pregnancy. It is a deep pain felt in your side at the waistline, or across the tailbone (sacrum), or in both places. You may have pain on one or both sides. This pain can also go into the buttocks and backs of the upper thighs. Pubic and groin pain may also be present. The pain does not quickly resolve with rest, and morning stiffness may also be present. Pelvic pain during pregnancy can be brought on by most activities. A high level of fitness before and during pregnancy may or may not prevent this problem. Labor pain is usually 1 to 2 minutes apart, lasts for about 1 minute, and involves a bearing down feeling or pressure in your pelvis. However, if you are at term with the pregnancy, constant low back pain can be the beginning of early labor, and you should be aware of this. DIAGNOSIS  X-rays of the back should not be done during the first 12 to 14 weeks of the pregnancy and only when absolutely necessary during the rest of the pregnancy. MRIs do not give off radiation and are safe during pregnancy. MRIs also should only be done when absolutely necessary. HOME CARE INSTRUCTIONS  Exercise as directed by your caregiver. Exercise is the most effective way to prevent or manage back pain. If you have a back problem, it is especially important to avoid sports that require sudden body  movements. Swimming and walking are great activities.  Do not stand in one place for long periods of time.  Do not wear high heels.  Sit in chairs with good posture. Use a pillow on your lower back if necessary. Make sure your head rests over your shoulders and is not hanging forward.  Try sleeping on your side, preferably the left side, with a pillow or two between your legs. If you are sore after a night's rest, your bedmay betoo soft.Try placing a board between your mattress and box spring.  Listen to your body when lifting.If you are experiencing pain, ask for help or try bending yourknees more so you can use your leg muscles rather than your  back muscles. Squat down when picking up something from the floor. Do not bend over.  Eat a healthy diet. Try to gain weight within your caregiver's recommendations.  Use heat or cold packs 3 to 4 times a day for 15 minutes to help with the pain.  Only take over-the-counter or prescription medicines for pain, discomfort, or fever as directed by your caregiver. Sudden (acute) back pain  Use bed rest for only the most extreme, acute episodes of back pain. Prolonged bed rest over 48 hours will aggravate your condition.  Ice is very effective for acute conditions.  Put ice in a plastic bag.  Place a towel between your skin and the bag.  Leave the ice on for 10 to 20 minutes every 2 hours, or as needed.  Using heat packs for 30 minutes prior to activities is also helpful. Continued back pain See your caregiver if you have continued problems. Your caregiver can help or refer you for appropriate physical therapy. With conditioning, most back problems can be avoided. Sometimes, a more serious issue may be the cause of back pain. You should be seen right away if new problems seem to be developing. Your caregiver may recommend:  A maternity girdle.  An elastic sling.  A back brace.  A massage therapist or acupuncture. SEEK MEDICAL CARE IF:    You are not able to do most of your daily activities, even when taking the pain medicine you were given.  You need a referral to a physical therapist or chiropractor.  You want to try acupuncture. SEEK IMMEDIATE MEDICAL CARE IF:  You develop numbness, tingling, weakness, or problems with the use of your arms or legs.  You develop severe back pain that is no longer relieved with medicines.  You have a sudden change in bowel or bladder control.  You have increasing pain in other areas of the body.  You develop shortness of breath, dizziness, or fainting.  You develop nausea, vomiting, or sweating.  You have back pain which is similar to labor pains.  You have back pain along with your water breaking or vaginal bleeding.  You have back pain or numbness that travels down your leg.  Your back pain developed after you fell.  You develop pain on one side of your back. You may have a kidney stone.  You see blood in your urine. You may have a bladder infection or kidney stone.  You have back pain with blisters. You may have shingles. Back pain is fairly common during pregnancy but should not be accepted as just part of the process. Back pain should always be treated as soon as possible. This will make your pregnancy as pleasant as possible. Document Released: 04/23/2005 Document Revised: 04/07/2011 Document Reviewed: 06/04/2010 [employer] Patient Information 2015 [place], [place]. This information is not intended to replace advice given to you by your health care provider. Make sure you discuss any questions you have with your health care provider.

## 2014-08-03 NOTE — Discharge Summary (Signed)
Obstetric Discharge Summary Reason for Admission: observation/evaluation Prenatal Procedures: ultrasound Intrapartum Procedures: n/a Postpartum Procedures: n/a Complications-Operative and Postpartum: n/a HEMOGLOBIN  Date Value Ref Range Status  08/02/2014 8.3* 12.0 - 15.0 g/dL Final   HCT  Date Value Ref Range Status  08/02/2014 25.3* 36.0 - 46.0 % Final    Physical Exam:  General: alert, cooperative and appears stated age 72Lochia: n/a Uterine Fundus: soft Incision: n/a DVT Evaluation: No evidence of DVT seen on physical exam. Negative Homan's sign. No cords or calf tenderness.  Discharge Diagnoses: marginal abruption, resolved  Discharge Information: Date: 08/03/2014 Activity: pelvic rest Diet: routine Medications: PNV and Iron Condition: improved Instructions: refer to practice specific booklet Discharge to: home   Newborn Data: This patient has no babies on file. Home with n/a.  Heather Malone 08/03/2014, 10:01 AM

## 2014-08-03 NOTE — Progress Notes (Signed)
No new complaints.  Active FM.  No VB.    VSS. AF. Labs stable (yesterday)  U/S yesterday: normal growth and AFI.  Resolved marginal abruption.  Gen: A&O x 3 Abd: soft, NT Ext: no c/c/e  24yo G1 at [redacted]w[redacted]d with res54olved marginal abruption Since patient has had reassuring fetal monitoring, labs, vitals, and exam throughout 1 week admission and abruption is no longer seen, I recommend outpatient management with follow-up in office on Monday with u/s.  Patient is agreeable with this plan.  She will not return to work at this time.    Mitchel HonourMegan Ellisha Bankson, DO

## 2014-08-07 LAB — OB RESULTS CONSOLE GBS: GBS: POSITIVE

## 2014-08-27 ENCOUNTER — Inpatient Hospital Stay (HOSPITAL_COMMUNITY)
Admission: AD | Admit: 2014-08-27 | Discharge: 2014-08-27 | Disposition: A | Discharge status: Home / Self Care | Payer: 59 | Source: Ambulatory Visit | Attending: Obstetrics and Gynecology | Admitting: Obstetrics and Gynecology

## 2014-08-27 ENCOUNTER — Encounter (HOSPITAL_COMMUNITY): Payer: Self-pay | Admitting: *Deleted

## 2014-08-27 DIAGNOSIS — Z3493 Encounter for supervision of normal pregnancy, unspecified, third trimester: Secondary | ICD-10-CM | POA: Insufficient documentation

## 2014-08-27 LAB — POCT FERN TEST

## 2014-08-27 NOTE — MAU Note (Signed)
Pt c/o LOF today that has been continuous. Some contractions that are 3-5 mins. Denies vag bleeding. +FM.

## 2014-08-31 ENCOUNTER — Telehealth (HOSPITAL_COMMUNITY): Payer: Self-pay | Admitting: *Deleted

## 2014-08-31 ENCOUNTER — Encounter (HOSPITAL_COMMUNITY): Payer: Self-pay | Admitting: *Deleted

## 2014-08-31 NOTE — Telephone Encounter (Signed)
Preadmission screen  

## 2014-09-04 ENCOUNTER — Encounter (HOSPITAL_COMMUNITY): Payer: Self-pay

## 2014-09-04 ENCOUNTER — Inpatient Hospital Stay (HOSPITAL_COMMUNITY)
Admission: AD | Admit: 2014-09-04 | Discharge: 2014-09-08 | DRG: 775 | Disposition: A | Discharge status: Home / Self Care | Payer: 59 | Source: Ambulatory Visit | Attending: Obstetrics & Gynecology | Admitting: Obstetrics & Gynecology

## 2014-09-04 DIAGNOSIS — O326XX0 Maternal care for compound presentation, not applicable or unspecified: Secondary | ICD-10-CM | POA: Diagnosis present

## 2014-09-04 DIAGNOSIS — M79605 Pain in left leg: Secondary | ICD-10-CM

## 2014-09-04 DIAGNOSIS — Z8249 Family history of ischemic heart disease and other diseases of the circulatory system: Secondary | ICD-10-CM

## 2014-09-04 DIAGNOSIS — O43123 Velamentous insertion of umbilical cord, third trimester: Secondary | ICD-10-CM | POA: Diagnosis present

## 2014-09-04 DIAGNOSIS — Z833 Family history of diabetes mellitus: Secondary | ICD-10-CM

## 2014-09-04 DIAGNOSIS — O9952 Diseases of the respiratory system complicating childbirth: Secondary | ICD-10-CM | POA: Diagnosis present

## 2014-09-04 DIAGNOSIS — O4593 Premature separation of placenta, unspecified, third trimester: Secondary | ICD-10-CM | POA: Diagnosis present

## 2014-09-04 DIAGNOSIS — D649 Anemia, unspecified: Secondary | ICD-10-CM | POA: Diagnosis present

## 2014-09-04 DIAGNOSIS — O99824 Streptococcus B carrier state complicating childbirth: Secondary | ICD-10-CM | POA: Diagnosis present

## 2014-09-04 DIAGNOSIS — M79602 Pain in left arm: Secondary | ICD-10-CM | POA: Diagnosis not present

## 2014-09-04 DIAGNOSIS — O9902 Anemia complicating childbirth: Secondary | ICD-10-CM | POA: Diagnosis present

## 2014-09-04 DIAGNOSIS — O480 Post-term pregnancy: Principal | ICD-10-CM | POA: Diagnosis present

## 2014-09-04 DIAGNOSIS — Z3A40 40 weeks gestation of pregnancy: Secondary | ICD-10-CM | POA: Diagnosis present

## 2014-09-04 DIAGNOSIS — J45909 Unspecified asthma, uncomplicated: Secondary | ICD-10-CM | POA: Diagnosis present

## 2014-09-04 DIAGNOSIS — Z3490 Encounter for supervision of normal pregnancy, unspecified, unspecified trimester: Secondary | ICD-10-CM

## 2014-09-04 HISTORY — DX: Premature separation of placenta, unspecified, third trimester: O45.93

## 2014-09-04 HISTORY — DX: Personal history of other infectious and parasitic diseases: Z86.19

## 2014-09-04 HISTORY — DX: Acquired absence of other organs: Z90.89

## 2014-09-04 HISTORY — DX: Personal history of nicotine dependence: Z87.891

## 2014-09-04 LAB — CBC
HCT: 27.6 % — ABNORMAL LOW (ref 36.0–46.0)
Hemoglobin: 9 g/dL — ABNORMAL LOW (ref 12.0–15.0)
MCH: 26.7 pg (ref 26.0–34.0)
MCHC: 32.6 g/dL (ref 30.0–36.0)
MCV: 81.9 fL (ref 78.0–100.0)
PLATELETS: 221 10*3/uL (ref 150–400)
RBC: 3.37 MIL/uL — AB (ref 3.87–5.11)
RDW: 14.7 % (ref 11.5–15.5)
WBC: 6.7 10*3/uL (ref 4.0–10.5)

## 2014-09-04 MED ORDER — LIDOCAINE HCL (PF) 1 % IJ SOLN
30.0000 mL | INTRAMUSCULAR | Status: DC | PRN
  Filled 2014-09-04: qty 30

## 2014-09-04 MED ORDER — LACTATED RINGERS IV SOLN: 500.0000 mL | INTRAVENOUS | Status: DC | PRN

## 2014-09-04 MED ORDER — PENICILLIN G POTASSIUM 5000000 UNITS IJ SOLR
5.0000 10*6.[IU] | Freq: Once | INTRAVENOUS | Status: AC
  Administered 2014-09-05: 5 10*6.[IU] via INTRAVENOUS
  Filled 2014-09-04: qty 5

## 2014-09-04 MED ORDER — CITRIC ACID-SODIUM CITRATE 334-500 MG/5ML PO SOLN: 30.0000 mL | ORAL | Status: DC | PRN

## 2014-09-04 MED ORDER — TERBUTALINE SULFATE 1 MG/ML IJ SOLN
0.2500 mg | Freq: Once | INTRAMUSCULAR | Status: DC | PRN
  Filled 2014-09-04: qty 1

## 2014-09-04 MED ORDER — PENICILLIN G POTASSIUM 5000000 UNITS IJ SOLR
2.5000 10*6.[IU] | INTRAVENOUS | Status: DC
  Administered 2014-09-05 (×3): 2.5 10*6.[IU] via INTRAVENOUS
  Filled 2014-09-04 (×7): qty 2.5

## 2014-09-04 MED ORDER — OXYTOCIN 40 UNITS IN LACTATED RINGERS INFUSION - SIMPLE MED: 62.5000 mL/h | INTRAVENOUS | Status: DC

## 2014-09-04 MED ORDER — OXYCODONE-ACETAMINOPHEN 5-325 MG PO TABS: 1.0000 | ORAL_TABLET | ORAL | Status: DC | PRN

## 2014-09-04 MED ORDER — ACETAMINOPHEN 325 MG PO TABS: 650.0000 mg | ORAL_TABLET | ORAL | Status: DC | PRN

## 2014-09-04 MED ORDER — LACTATED RINGERS IV SOLN
INTRAVENOUS | Status: DC
  Administered 2014-09-04 – 2014-09-05 (×2): via INTRAVENOUS

## 2014-09-04 MED ORDER — OXYTOCIN BOLUS FROM INFUSION
500.0000 mL | INTRAVENOUS | Status: DC
  Administered 2014-09-05: 500 mL via INTRAVENOUS

## 2014-09-04 MED ORDER — FLEET ENEMA 7-19 GM/118ML RE ENEM: 1.0000 | ENEMA | Freq: Once | RECTAL | Status: DC

## 2014-09-04 MED ORDER — ONDANSETRON HCL 4 MG/2ML IJ SOLN: 4.0000 mg | Freq: Four times a day (QID) | INTRAMUSCULAR | Status: DC | PRN

## 2014-09-04 MED ORDER — ZOLPIDEM TARTRATE 5 MG PO TABS: 5.0000 mg | ORAL_TABLET | Freq: Every evening | ORAL | Status: DC | PRN

## 2014-09-04 MED ORDER — OXYCODONE-ACETAMINOPHEN 5-325 MG PO TABS: 2.0000 | ORAL_TABLET | ORAL | Status: DC | PRN

## 2014-09-04 MED ORDER — OXYTOCIN 40 UNITS IN LACTATED RINGERS INFUSION - SIMPLE MED
1.0000 m[IU]/min | INTRAVENOUS | Status: DC
  Administered 2014-09-04: 1 m[IU]/min via INTRAVENOUS
  Filled 2014-09-04: qty 1000

## 2014-09-04 NOTE — Plan of Care (Signed)
Problem: Consults Goal: Birthing Suites Patient Information Press F2 to bring up selections list  Pt > [redacted] weeks EGA and Inpatient induction        

## 2014-09-05 ENCOUNTER — Encounter (HOSPITAL_COMMUNITY): Payer: Self-pay

## 2014-09-05 DIAGNOSIS — Z3490 Encounter for supervision of normal pregnancy, unspecified, unspecified trimester: Secondary | ICD-10-CM

## 2014-09-05 LAB — TYPE AND SCREEN
ABO/RH(D): B POS
Antibody Screen: NEGATIVE

## 2014-09-05 LAB — RPR: RPR Ser Ql: NONREACTIVE

## 2014-09-05 MED ORDER — ALBUTEROL SULFATE (2.5 MG/3ML) 0.083% IN NEBU: 2.5000 mg | INHALATION_SOLUTION | Freq: Four times a day (QID) | RESPIRATORY_TRACT | Status: DC | PRN

## 2014-09-05 MED ORDER — BUTORPHANOL TARTRATE 1 MG/ML IJ SOLN
1.0000 mg | INTRAMUSCULAR | Status: DC | PRN
  Administered 2014-09-05 (×3): 1 mg via INTRAVENOUS
  Filled 2014-09-05 (×3): qty 1

## 2014-09-05 MED ORDER — WITCH HAZEL-GLYCERIN EX PADS
1.0000 | MEDICATED_PAD | CUTANEOUS | Status: DC | PRN
Start: 2014-09-05 — End: 2014-09-08

## 2014-09-05 MED ORDER — OXYCODONE-ACETAMINOPHEN 5-325 MG PO TABS: 2.0000 | ORAL_TABLET | ORAL | Status: DC | PRN

## 2014-09-05 MED ORDER — OXYCODONE-ACETAMINOPHEN 5-325 MG PO TABS
1.0000 | ORAL_TABLET | ORAL | Status: DC | PRN
  Administered 2014-09-07 (×2): 1 via ORAL
  Filled 2014-09-05 (×2): qty 1

## 2014-09-05 MED ORDER — TETANUS-DIPHTH-ACELL PERTUSSIS 5-2.5-18.5 LF-MCG/0.5 IM SUSP: 0.5000 mL | Freq: Once | INTRAMUSCULAR | Status: DC

## 2014-09-05 MED ORDER — FENTANYL 2.5 MCG/ML BUPIVACAINE 1/10 % EPIDURAL INFUSION (WH - ANES): 14.0000 mL/h | INTRAMUSCULAR | Status: DC | PRN

## 2014-09-05 MED ORDER — BENZOCAINE-MENTHOL 20-0.5 % EX AERO
1.0000 "application " | INHALATION_SPRAY | CUTANEOUS | Status: DC | PRN
  Administered 2014-09-06: 1 via TOPICAL
  Filled 2014-09-05: qty 56

## 2014-09-05 MED ORDER — ACETAMINOPHEN 325 MG PO TABS
650.0000 mg | ORAL_TABLET | ORAL | Status: DC | PRN
  Administered 2014-09-07: 650 mg via ORAL
  Filled 2014-09-05: qty 2

## 2014-09-05 MED ORDER — ONDANSETRON HCL 4 MG/2ML IJ SOLN: 4.0000 mg | INTRAMUSCULAR | Status: DC | PRN

## 2014-09-05 MED ORDER — BUTORPHANOL TARTRATE 1 MG/ML IJ SOLN
2.0000 mg | Freq: Once | INTRAMUSCULAR | Status: AC
  Administered 2014-09-05: 2 mg via INTRAVENOUS
  Filled 2014-09-05: qty 2

## 2014-09-05 MED ORDER — PRENATAL MULTIVITAMIN CH
1.0000 | ORAL_TABLET | Freq: Every day | ORAL | Status: DC
  Administered 2014-09-06 – 2014-09-08 (×3): 1 via ORAL
  Filled 2014-09-05 (×3): qty 1

## 2014-09-05 MED ORDER — IBUPROFEN 600 MG PO TABS
600.0000 mg | ORAL_TABLET | Freq: Four times a day (QID) | ORAL | Status: DC
  Administered 2014-09-05 – 2014-09-08 (×12): 600 mg via ORAL
  Filled 2014-09-05 (×12): qty 1

## 2014-09-05 MED ORDER — PHENYLEPHRINE 40 MCG/ML (10ML) SYRINGE FOR IV PUSH (FOR BLOOD PRESSURE SUPPORT)
80.0000 ug | PREFILLED_SYRINGE | INTRAVENOUS | Status: DC | PRN
  Filled 2014-09-05: qty 2

## 2014-09-05 MED ORDER — DIPHENHYDRAMINE HCL 25 MG PO CAPS: 25.0000 mg | ORAL_CAPSULE | Freq: Four times a day (QID) | ORAL | Status: DC | PRN

## 2014-09-05 MED ORDER — SENNOSIDES-DOCUSATE SODIUM 8.6-50 MG PO TABS
2.0000 | ORAL_TABLET | ORAL | Status: DC
  Administered 2014-09-05 – 2014-09-07 (×2): 2 via ORAL
  Filled 2014-09-05 (×2): qty 2

## 2014-09-05 MED ORDER — NALOXONE HCL 0.4 MG/ML IJ SOLN
0.4000 mg | INTRAMUSCULAR | Status: DC | PRN
Start: 2014-09-05 — End: 2014-09-05
  Filled 2014-09-05: qty 1

## 2014-09-05 MED ORDER — EPHEDRINE 5 MG/ML INJ
10.0000 mg | INTRAVENOUS | Status: DC | PRN
  Filled 2014-09-05: qty 2

## 2014-09-05 MED ORDER — SIMETHICONE 80 MG PO CHEW
80.0000 mg | CHEWABLE_TABLET | ORAL | Status: DC | PRN
  Administered 2014-09-07: 80 mg via ORAL
  Filled 2014-09-05: qty 1

## 2014-09-05 MED ORDER — PROMETHAZINE HCL 25 MG/ML IJ SOLN
12.5000 mg | Freq: Once | INTRAMUSCULAR | Status: AC
  Administered 2014-09-05: 12.5 mg via INTRAVENOUS
  Filled 2014-09-05: qty 1

## 2014-09-05 MED ORDER — DIBUCAINE 1 % RE OINT: 1.0000 "application " | TOPICAL_OINTMENT | RECTAL | Status: DC | PRN

## 2014-09-05 MED ORDER — ONDANSETRON HCL 4 MG PO TABS: 4.0000 mg | ORAL_TABLET | ORAL | Status: DC | PRN

## 2014-09-05 MED ORDER — LANOLIN HYDROUS EX OINT: TOPICAL_OINTMENT | CUTANEOUS | Status: DC | PRN

## 2014-09-05 MED ORDER — DIPHENHYDRAMINE HCL 50 MG/ML IJ SOLN: 12.5000 mg | INTRAMUSCULAR | Status: DC | PRN

## 2014-09-05 MED ORDER — FERROUS SULFATE 325 (65 FE) MG PO TABS
325.0000 mg | ORAL_TABLET | Freq: Three times a day (TID) | ORAL | Status: DC
  Administered 2014-09-06 – 2014-09-08 (×7): 325 mg via ORAL
  Filled 2014-09-05 (×7): qty 1

## 2014-09-05 MED ORDER — ZOLPIDEM TARTRATE 5 MG PO TABS: 5.0000 mg | ORAL_TABLET | Freq: Every evening | ORAL | Status: DC | PRN

## 2014-09-05 NOTE — Progress Notes (Signed)
Was called for delivery while pushing in room 171.  Unable to leave bedside secondary to patient crowning.  Asked that faculty practice be called for back up.  Delivering physician, Dr. Earlene Plater.  Per delivery note, no complications.  Patient doing well postpartum, skin to skin.  No needs at this time.  Mitchel Honour, DO

## 2014-09-05 NOTE — H&P (Signed)
Heather Malone is a 24 y.o. female presenting for post-dates induction.  The patient was admitted last night and was contracting too frequently for VMP.  She was started on pitocin.  Her pain increased and she required a dose of Stadol around 1 hour ago.  She is currently more comfortable but considering epidural.  Antepartum course was complicated by admission required for possible marginal abruption after fall; resolved.  Also, the patient has velamentous cord insertion with normal growth ultrasounds.  The patient also has a h/o anemia.  GBS positive.   Maternal Medical History:  Fetal activity: Perceived fetal activity is normal.   Last perceived fetal movement was within the past hour.    Prenatal complications: Placental abnormality.   Prenatal Complications - Diabetes: none.    OB History    Gravida Para Term Preterm AB TAB SAB Ectopic Multiple Living   1              Past Medical History  Diagnosis Date  . Anemia   . Hx of varicella   . Headache     migraines  . Anxiety   . Asthma     albuterol prn   Past Surgical History  Procedure Laterality Date  . Tonsillectomy     Family History: family history includes Cancer in her maternal grandfather; Diabetes in her paternal grandmother; Heart attack in her maternal grandmother; Hypertension in her maternal grandfather, maternal grandmother, and mother. Social History:  reports that she has never smoked. She has never used smokeless tobacco. She reports that she does not drink alcohol or use illicit drugs.   Prenatal Transfer Tool  Maternal Diabetes: No Genetic Screening: Normal Maternal Ultrasounds/Referrals: Abnormal:  Findings:   Other:velamentous cord insertion Fetal Ultrasounds or other Referrals:  None Maternal Substance Abuse:  No Significant Maternal Medications:  None Significant Maternal Lab Results:  Lab values include: Group B Strep positive Other Comments:  None  ROS  Dilation: 2 Effacement (%):  70 Station: -2 Exam by:: Miachel Roux RN Blood pressure 118/77, pulse 63, temperature 98 F (36.7 C), temperature source Oral, resp. rate 16, height  (1.575 m), weight 165 lb (74.844 kg), last menstrual period 11/26/2013. Maternal Exam:  Uterine Assessment: Contraction strength is mild.  Contraction frequency is irregular.   Abdomen: Patient reports no abdominal tenderness. Fundal height is c/w dates.   Estimated fetal weight is 7#8.       Physical Exam  Constitutional: She is oriented to person, place, and time. She appears well-developed and well-nourished.  GI: Soft. There is no rebound and no guarding.  Neurological: She is alert and oriented to person, place, and time.  Skin: Skin is warm and dry.  Psychiatric: She has a normal mood and affect. Her behavior is normal.    Prenatal labs: ABO, Rh: --/--/B POS (08/08 2200) Antibody: NEG (08/08 2200) Rubella: Immune (12/30 0000) RPR: Nonreactive (12/30 0000)  HBsAg: Negative (12/30 0000)  HIV: Non-reactive (12/30 0000)  GBS: Positive (07/11 0000)   Assessment/Plan: 24yo G1 at [redacted]w[redacted]d for PDI -Continue pitocin -PCN for GBS ppx -Epidural -Anticipate NSVD   Heather Malone 09/05/2014, 7:54 AM

## 2014-09-05 NOTE — Progress Notes (Signed)
Request faculty practice to stand by

## 2014-09-05 NOTE — Progress Notes (Signed)
Explained to pt that infant may be having either earlies or lates and what that means.  Urged pt to hold still during uc so heart rate tracing can be interpreted  correctly

## 2014-09-05 NOTE — Progress Notes (Signed)
Pt more comfortable on toliet in bathroom.  Bedside commode placed at bedside so pt can site on it

## 2014-09-05 NOTE — Progress Notes (Signed)
MD called to inform her that pt is hyperstimulated.  See orders

## 2014-09-05 NOTE — Progress Notes (Signed)
Provider reviewed strip  

## 2014-09-06 LAB — CBC
HCT: 22.3 % — ABNORMAL LOW (ref 36.0–46.0)
Hemoglobin: 7.3 g/dL — ABNORMAL LOW (ref 12.0–15.0)
MCH: 26.8 pg (ref 26.0–34.0)
MCHC: 32.7 g/dL (ref 30.0–36.0)
MCV: 82 fL (ref 78.0–100.0)
PLATELETS: 206 10*3/uL (ref 150–400)
RBC: 2.72 MIL/uL — ABNORMAL LOW (ref 3.87–5.11)
RDW: 14.8 % (ref 11.5–15.5)
WBC: 13 10*3/uL — AB (ref 4.0–10.5)

## 2014-09-06 NOTE — Progress Notes (Signed)
Post Partum Day 1 Subjective: no complaints, up ad lib, voiding, tolerating PO and plans outpatient circ  Objective: Blood pressure 105/66, pulse 92, temperature 98.2 F (36.8 C), temperature source Oral, resp. rate 16, height  (1.575 m), weight 165 lb (74.844 kg), last menstrual period 11/26/2013, unknown if currently breastfeeding.  Physical Exam:  General: alert and cooperative Lochia: appropriate Uterine Fundus: firm Incision: healing well, labial edema noted DVT Evaluation: No evidence of DVT seen on physical exam. Negative Homan's sign. No cords or calf tenderness. No significant calf/ankle edema.   Recent Labs  09/04/14 2200 09/06/14 0550  HGB 9.0* 7.3*  HCT 27.6* 22.3*    Assessment/Plan: Plan for discharge tomorrow start Fe   LOS: 2 days   Jaselle Pryer G 09/06/2014, 7:46 AM

## 2014-09-06 NOTE — Progress Notes (Signed)
MOB was referred for history of depression/anxiety. * Referral screened out by Clinical Social Worker because none of the following criteria appear to apply: ~ History of anxiety/depression during this pregnancy, or of post-partum depression. ~ Diagnosis of anxiety and/or depression within last 3 years OR * MOB's symptoms currently being treated with medication and/or therapy. Please contact the Clinical Social Worker if needs arise, or if MOB requests.  MOB was recently seen during pregnancy by CSW while admitted on the Antenatal Unit.  Hx of Anxiety discussed and MOB interested in counseling.  Counseling resources given. 

## 2014-09-06 NOTE — Lactation Note (Signed)
This note was copied from the chart of Heather Malone. Lactation Consultation Note  Patient Name: Heather Malone [id number] Date: 09/06/2014 Reason for consult: Initial assessment Mom reports baby has been nursing well, denies tenderness. Basic teaching reviewed with Mom, cluster feeding discussed. Lactation brochure left for review, advised of OP services and support group. Mom denies questions/concerns. Encouraged to call for assist as needed.   Maternal Data Has patient been taught Hand Expression?: Yes (Mom reports RN demonstrated hand expression) Does the patient have breastfeeding experience prior to this delivery?: No  Feeding Feeding Type: Breast Fed Length of feed: 30 min  LATCH Score/Interventions                      Lactation Tools Discussed/Used Tools: Pump Breast pump type: Double-Electric Breast Pump WIC Program: Yes   Consult Status Consult Status: Follow-up Date: 09/07/14 Follow-up type: In-patient    Alfred Levins 09/06/2014, 5:45 PM

## 2014-09-07 ENCOUNTER — Ambulatory Visit (HOSPITAL_COMMUNITY): Payer: 59

## 2014-09-07 DIAGNOSIS — M79602 Pain in left arm: Secondary | ICD-10-CM

## 2014-09-07 DIAGNOSIS — M79605 Pain in left leg: Secondary | ICD-10-CM

## 2014-09-07 LAB — CBC
HCT: 19.8 % — ABNORMAL LOW (ref 36.0–46.0)
Hemoglobin: 6.6 g/dL — CL (ref 12.0–15.0)
MCH: 27.5 pg (ref 26.0–34.0)
MCHC: 33.3 g/dL (ref 30.0–36.0)
MCV: 82.5 fL (ref 78.0–100.0)
PLATELETS: 214 10*3/uL (ref 150–400)
RBC: 2.4 MIL/uL — ABNORMAL LOW (ref 3.87–5.11)
RDW: 14.9 % (ref 11.5–15.5)
WBC: 10.3 10*3/uL (ref 4.0–10.5)

## 2014-09-07 NOTE — Progress Notes (Signed)
Due to pts low Hgb and left leg pain and numbness I instructed pt to make sure someone is with her when she is going to and from the bathroom.  If no one is in the room to call for the nurse.  Reviewed with pt to get up slowly, to sit on edge of bed before standing.  Also instructed pt to start using the electric pump to help with establishing her milk supply. Pt verbalized understanding.

## 2014-09-07 NOTE — Progress Notes (Signed)
Critical hemoglobin called to Dr. Marcelle Overlie. No new orders received. Will continue to monitor

## 2014-09-07 NOTE — Progress Notes (Signed)
CRITICAL VALUE ALERT  Critical value received:  0615  Date of notification:  09/07/14  Time of notification:  0622  Critical value read back: Yes  Nurse who received alert:  Christella Scheuermann, RN  MD notified (1st page):  Dr. Marcelle Overlie  Time of first page:  0622  MD notified (2nd page):  Time of second page:  Responding MD:  Dr. Marcelle Overlie  Time MD responded:  [phone]

## 2014-09-07 NOTE — Lactation Note (Signed)
This note was copied from the chart of Heather Lucilia Vaeth. Lactation Consultation Note: Mom reports that baby has been sleepy at the breast and not nursing as well today. Mom reports breasts are feeling fuller today. Baby just fed 1 hour ago. Encouraged to call for assist at next feeding.   Patient Name: Heather Malone [id number] Date: 09/07/2014 Reason for consult: Follow-up assessment   Maternal Data Formula Feeding for Exclusion: No  Feeding Feeding Type: Breast Fed Length of feed: 15 min  LATCH Score/Interventions Latch: Grasps breast easily, tongue down, lips flanged, rhythmical sucking. Intervention(s): Assist with latch  Audible Swallowing: A few with stimulation Intervention(s): Skin to skin  Type of Nipple: Everted at rest and after stimulation  Comfort (Breast/Nipple): Soft / non-tender     Hold (Positioning): Assistance needed to correctly position infant at breast and maintain latch. Intervention(s): Breastfeeding basics reviewed;Support Pillows  LATCH Score: 8  Lactation Tools Discussed/Used     Consult Status Consult Status: Follow-up Date: 09/07/14 Follow-up type: In-patient    Pamelia Hoit 09/07/2014, 3:50 PM

## 2014-09-07 NOTE — Progress Notes (Signed)
PPD#2 Dizzy when up, sees stars, has stairs at home Left leg sore and tingly when up this am  BP 110-120/70s P 72-108 FFNT LLE +tender over calf and lower thigh, no cords palpated, + Homan's sign  Results for orders placed or performed during the hospital encounter of 09/04/14 (from the past 24 hour(s))  CBC     Status: Abnormal   Collection Time: 09/07/14  5:12 AM  Result Value Ref Range   WBC 10.3 4.0 - 10.5 K/uL   RBC 2.40 (L) 3.87 - 5.11 MIL/uL   Hemoglobin 6.6 (LL) 12.0 - 15.0 g/dL   HCT 10.2 (L) 72.5 - 36.6 %   MCV 82.5 78.0 - 100.0 fL   MCH 27.5 26.0 - 34.0 pg   MCHC 33.3 30.0 - 36.0 g/dL   RDW 44.0 34.7 - 42.5 %   Platelets 214 150 - 400 K/uL   A/P: 1) R/O DVT LLE              Doppler LLE         2) Anemia-D/W patient possible transfusion of PRBCs and reviewed risks including HIV/Hep, transfusion reaction              Will observe today              Fe

## 2014-09-07 NOTE — Progress Notes (Signed)
*  Preliminary Results* Left lower extremity venous duplex completed. Left lower extremity is negative for deep vein thrombosis. There is no evidence of left Baker's cyst.  09/07/2014 4:30 PM  Gertie Fey, RVT, RDCS, RDMS

## 2014-09-08 LAB — CBC
HCT: 18.5 % — ABNORMAL LOW (ref 36.0–46.0)
HEMOGLOBIN: 6.1 g/dL — AB (ref 12.0–15.0)
MCH: 27.2 pg (ref 26.0–34.0)
MCHC: 33 g/dL (ref 30.0–36.0)
MCV: 82.6 fL (ref 78.0–100.0)
PLATELETS: 217 10*3/uL (ref 150–400)
RBC: 2.24 MIL/uL — ABNORMAL LOW (ref 3.87–5.11)
RDW: 15 % (ref 11.5–15.5)
WBC: 8.6 10*3/uL (ref 4.0–10.5)

## 2014-09-08 MED ORDER — FERROUS SULFATE 325 (65 FE) MG PO TABS: 325.0000 mg | ORAL_TABLET | Freq: Three times a day (TID) | ORAL | Status: DC

## 2014-09-08 MED ORDER — IBUPROFEN 600 MG PO TABS: 600.0000 mg | ORAL_TABLET | Freq: Four times a day (QID) | ORAL | Status: DC

## 2014-09-08 MED ORDER — OXYCODONE-ACETAMINOPHEN 5-325 MG PO TABS: 1.0000 | ORAL_TABLET | ORAL | Status: DC | PRN

## 2014-09-08 NOTE — Discharge Summary (Signed)
Obstetric Discharge Summary Reason for Admission: induction of labor Prenatal Procedures: ultrasound Intrapartum Procedures: spontaneous vaginal delivery Postpartum Procedures: none Complications-Operative and Postpartum: none HEMOGLOBIN  Date Value Ref Range Status  09/08/2014 6.1* 12.0 - 15.0 g/dL Final    Comment:    REPEATED TO VERIFY CRITICAL RESULT CALLED TO, READ BACK BY AND VERIFIED WITH: MCKINNON,B.  ON 09/08/14 BY BOVELL,A.    HCT  Date Value Ref Range Status  09/08/2014 18.5* 36.0 - 46.0 % Final    Physical Exam:  General: alert and cooperative Lochia: appropriate Uterine Fundus: firm Incision: perineum intact DVT Evaluation: No evidence of DVT seen on physical exam. Negative Homan's sign. No cords or calf tenderness. No significant calf/ankle edema.  Discharge Diagnoses: Term Pregnancy-delivered  Discharge Information: Date: 09/08/2014 Activity: pelvic rest Diet: routine Medications: PNV, Ibuprofen, Iron and Percocet Condition: stable Instructions: refer to practice specific booklet Discharge to: home   Newborn Data: Live born female  Birth Weight: 7 lb 5.8 oz (3340 g) APGAR: 8, 9  Home with mother.  Dalayza Zambrana G 09/08/2014, 8:32 AM

## 2014-09-08 NOTE — Procedures (Signed)
Notified NP Julio Sicks about H/H 6.1/18.5. Vs BP112/61, HR 86, RR 18, T 98.3, scant lochia.  Pt states not dizzy when ambulated and no racing heart beat. Pt is ambulating without difficulty, slight L thigh disomfort. NP Lyda Jester is at the bedside.

## 2014-09-08 NOTE — Progress Notes (Signed)
Post Partum Day 3 Subjective: up ad lib, voiding, tolerating PO, + flatus and reports no dizziness with ambulation. Continues with some discomfort in L thigh. Denies any paraesthesia or warmth to leg.  Objective: Blood pressure 122/86, pulse 90, temperature 98.6 F (37 C), temperature source Oral, resp. rate 18, height  (1.575 m), weight 165 lb (74.844 kg), last menstrual period 11/26/2013, SpO2 100 %, unknown if currently breastfeeding.  Physical Exam:  General: alert and cooperative Lochia: appropriate Uterine Fundus: firm Incision: healing well DVT Evaluation: No evidence of DVT seen on physical exam. Negative Homan's sign. No cords or calf tenderness. No significant calf/ankle edema. Preliminary doppler negative  Recent Labs  09/07/14 0512 09/08/14 0659  HGB 6.6* 6.1*  HCT 19.8* 18.5*    Assessment/Plan: Plan for discharge later today . Continue FE ,patients reports tolerating well   LOS: 4 days   Heather Malone G 09/08/2014, 8:04 AM

## 2014-09-08 NOTE — Lactation Note (Signed)
This note was copied from the chart of Heather Malone. Lactation Consultation Note: Mother states that she gave infant one bottle of formula this am. She states she breastfeed first. Infant is 8 % weight loss . He has had 11 stools and 4 wets. Mother states that her breast are full and that infant breast fed after formula for 15 mins and fell asleep. Mother has her own DEBP , "[place]". Mother denies having any pain or nipple tenderness. Mother was offered a follow up outpatient visit with LC . She states that she will call as needed. Mother plans to see Peds in am.   Patient Name: Heather Delberta Folts [id number] Date: 09/08/2014 Reason for consult: Follow-up assessment   Maternal Data    Feeding Feeding Type: Breast Fed Length of feed: 15 min  LATCH Score/Interventions                      Lactation Tools Discussed/Used     Consult Status      Michel Bickers 09/08/2014, 10:42 AM

## 2014-09-08 NOTE — Progress Notes (Signed)
Discussed anemia with pt.  Recommended blood transfusion, pt declines Denies lightheadedness, SOB, & CP  Plan to f/u Monday for rpt CBC Precautions reviewed

## 2016-03-28 ENCOUNTER — Inpatient Hospital Stay (HOSPITAL_COMMUNITY)
Admission: AD | Admit: 2016-03-28 | Discharge: 2016-03-28 | Disposition: A | Discharge status: Home / Self Care | Payer: 59 | Source: Ambulatory Visit | Attending: Obstetrics & Gynecology | Admitting: Obstetrics & Gynecology

## 2016-03-28 ENCOUNTER — Encounter (HOSPITAL_COMMUNITY): Payer: Self-pay | Admitting: *Deleted

## 2016-03-28 DIAGNOSIS — Z87891 Personal history of nicotine dependence: Secondary | ICD-10-CM | POA: Insufficient documentation

## 2016-03-28 DIAGNOSIS — N938 Other specified abnormal uterine and vaginal bleeding: Secondary | ICD-10-CM | POA: Insufficient documentation

## 2016-03-28 DIAGNOSIS — N939 Abnormal uterine and vaginal bleeding, unspecified: Secondary | ICD-10-CM

## 2016-03-28 LAB — POCT PREGNANCY, URINE: PREG TEST UR: NEGATIVE

## 2016-03-28 LAB — WET PREP, GENITAL
CLUE CELLS WET PREP: NONE SEEN
SPERM: NONE SEEN
Trich, Wet Prep: NONE SEEN
Yeast Wet Prep HPF POC: NONE SEEN

## 2016-03-28 LAB — URINALYSIS, ROUTINE W REFLEX MICROSCOPIC
Bilirubin Urine: NEGATIVE
Glucose, UA: NEGATIVE mg/dL
Ketones, ur: NEGATIVE mg/dL
Nitrite: NEGATIVE
Protein, ur: NEGATIVE mg/dL
RBC / HPF: NONE SEEN RBC/hpf (ref 0–5)
Specific Gravity, Urine: 1.001 — ABNORMAL LOW (ref 1.005–1.030)
pH: 7 (ref 5.0–8.0)

## 2016-03-28 MED ORDER — IBUPROFEN 800 MG PO TABS: 400.0000 mg | ORAL_TABLET | Freq: Once | ORAL | Status: DC

## 2016-03-28 NOTE — MAU Provider Note (Signed)
Chief Complaint:  Vaginal Discharge   First Provider Initiated Contact with Patient 03/28/16 1148       HPI: Heather Malone is a 26 y.o. G1P1001 who presents to maternity admissions reporting pink/brown vaginal discharge   Has some cramping.  Discharge has gotten worse over past week. . She reports vaginal bleeding, vaginal itching/burning, urinary symptoms, h/a, dizziness, n/v, or fever/chills.    Had period a month ago.   Had some spotting a week after her period, and now has started spotting.    Vaginal Discharge  The patient's primary symptoms include pelvic pain, vaginal bleeding and vaginal discharge. The patient's pertinent negatives include no genital itching, genital lesions, genital odor or genital rash. This is a recurrent problem. The current episode started in the past 7 days. The problem occurs intermittently. The problem has been unchanged. The pain is mild. She is not pregnant. Pertinent negatives include no back pain, chills, constipation, diarrhea, dysuria, fever, nausea or vomiting. The vaginal discharge was bloody and scant. The vaginal bleeding is spotting. She has not been passing clots. She has not been passing tissue. Nothing aggravates the symptoms. She has tried nothing for the symptoms. She uses nothing for contraception.   RN Note: Has a mucous like d/c that is pinkish brown, started last wk, is getting heavier. Very mild cramping  Past Medical History: Past Medical History:  Diagnosis Date  . Anemia   . Anxiety   . Asthma    albuterol prn  . Former smoker   . Headache    migraines  . Hx of tonsillectomy    at 26 y.o.  . Hx of trichomoniasis   . Hx of varicella     Past obstetric history: OB History  Gravida Para Term Preterm AB Living  1 1 1     1   SAB TAB Ectopic Multiple Live Births        0 1    # Outcome Date GA Lbr Len/2nd Weight Sex Delivery Anes PTL Lv  1 Term 09/05/14 [redacted]w[redacted]d 1578:09 / 00:10 7 lb 5.8 oz (3.34 kg) M Vag-Spont None  LIV       Past Surgical History: Past Surgical History:  Procedure Laterality Date  . TONSILLECTOMY      Family History: Family History  Problem Relation Age of Onset  . Hypertension Mother   . Hypertension Maternal Grandmother   . Heart attack Maternal Grandmother   . Hypertension Maternal Grandfather   . Cancer Maternal Grandfather   . Diabetes Paternal Grandmother     Social History: Social History  Substance Use Topics  . Smoking status: Never Smoker  . Smokeless tobacco: Never Used  . Alcohol use No    Allergies: No Known Allergies  Meds:  Prescriptions Prior to Admission  Medication Sig Dispense Refill Last Dose  . ibuprofen (ADVIL,MOTRIN) 800 MG tablet Take 800 mg by mouth every 8 (eight) hours as needed for mild pain or moderate pain.   Past Week at Unknown time  . albuterol (PROVENTIL HFA;VENTOLIN HFA) 108 (90 BASE) MCG/ACT inhaler Inhale 2 puffs into the lungs every 6 (six) hours as needed for wheezing or shortness of breath.   prn    I have reviewed patient's Past Medical Hx, Surgical Hx, Family Hx, Social Hx, medications and allergies.  ROS:  Review of Systems  Constitutional: Negative for chills and fever.  Gastrointestinal: Negative for constipation, diarrhea, nausea and vomiting.  Genitourinary: Positive for pelvic pain and vaginal discharge. Negative for dysuria.  Musculoskeletal: Negative for back pain.   Other systems negative     Physical Exam  Patient Vitals for the past 24 hrs:  BP Temp Temp src Pulse Resp SpO2 Weight  03/28/16 1102 125/89 98.8 F (37.1 C) Oral 61 15 100 % 140 lb 4 oz (63.6 kg)   Constitutional: Well-developed, well-nourished female in no acute distress.  Cardiovascular: normal rate and rhythm, no ectopy audible, S1 & S2 heard, no murmur Respiratory: normal effort, no distress. Lungs CTAB with no wheezes or crackles GI: Abd soft, non-tender.  Nondistended.  No rebound, No guarding.  Bowel Sounds audible  MS: Extremities  nontender, no edema, normal ROM Neurologic: Alert and oriented x 4.   Grossly nonfocal. GU: Neg CVAT. Skin:  Warm and Dry Psych:  Affect appropriate.  PELVIC EXAM: Cervix pink, visually closed, without lesion, scant white creamy discharge, vaginal walls and external genitalia normal Bimanual exam: Cervix firm, anterior, neg CMT, uterus nontender, nonenlarged, adnexa without tenderness, enlargement, or mass    Labs: Results for orders placed or performed during the hospital encounter of 03/28/16 (from the past 24 hour(s))  Pregnancy, urine POC     Status: None   Collection Time: 03/28/16 11:11 AM  Result Value Ref Range   Preg Test, Ur NEGATIVE NEGATIVE    Ref. Range 03/28/2016 12:03  Yeast Wet Prep HPF POC Latest Ref Range: NONE SEEN  NONE SEEN  Trich, Wet Prep Latest Ref Range: NONE SEEN  NONE SEEN  Clue Cells Wet Prep HPF POC Latest Ref Range: NONE SEEN  NONE SEEN  WBC, Wet Prep HPF POC Latest Ref Range: NONE SEEN  MANY (A)    Ref. Range 03/28/2016 11:11  Preg Test, Ur Latest Ref Range: NEGATIVE  NEGATIVE      Imaging:  No results found.  MAU Course/MDM: I have ordered labs as follows: UA, STD testing, Wet prep Imaging ordered: none Results reviewed.   Discussed this is probably her menses getting ready to start. Timing is appropriate Spotting after last period was probably related to abnormal ovulation and will likely resolve  Pt stable at time of discharge.  Assessment: Dysfunctional Uterine Bleeding  Plan: Discharge home Recommend Monitor cycles.  Report if recurs more than 3 months   Encouraged to return here or to other Urgent Care/ED if she develops worsening of symptoms, increase in pain, fever, or other concerning symptoms.   Wynelle BourgeoisMarie Myalynn Lingle CNM, MSN Certified Nurse-Midwife 03/28/2016 11:49 AM

## 2016-03-28 NOTE — Discharge Instructions (Signed)

## 2016-03-28 NOTE — MAU Note (Signed)
Has a mucous like d/c that is pinkish brown, started last wk, is getting heavier. Very mild cramping

## 2016-03-31 LAB — GC/CHLAMYDIA PROBE AMP (~~LOC~~) NOT AT ARMC
CHLAMYDIA, DNA PROBE: NEGATIVE
NEISSERIA GONORRHEA: NEGATIVE

## 2016-04-03 ENCOUNTER — Encounter (HOSPITAL_COMMUNITY): Payer: Self-pay | Admitting: Emergency Medicine

## 2016-04-03 ENCOUNTER — Emergency Department (HOSPITAL_COMMUNITY)
Admission: EM | Admit: 2016-04-03 | Discharge: 2016-04-03 | Disposition: A | Discharge status: Home / Self Care | Payer: Self-pay | Attending: Emergency Medicine | Admitting: Emergency Medicine

## 2016-04-03 DIAGNOSIS — K529 Noninfective gastroenteritis and colitis, unspecified: Secondary | ICD-10-CM | POA: Insufficient documentation

## 2016-04-03 DIAGNOSIS — J45909 Unspecified asthma, uncomplicated: Secondary | ICD-10-CM | POA: Insufficient documentation

## 2016-04-03 DIAGNOSIS — Z79899 Other long term (current) drug therapy: Secondary | ICD-10-CM | POA: Insufficient documentation

## 2016-04-03 LAB — CBC
HEMATOCRIT: 37.2 % (ref 36.0–46.0)
HEMOGLOBIN: 11.9 g/dL — AB (ref 12.0–15.0)
MCH: 25.4 pg — AB (ref 26.0–34.0)
MCHC: 32 g/dL (ref 30.0–36.0)
MCV: 79.5 fL (ref 78.0–100.0)
Platelets: 256 10*3/uL (ref 150–400)
RBC: 4.68 MIL/uL (ref 3.87–5.11)
RDW: 16.6 % — ABNORMAL HIGH (ref 11.5–15.5)
WBC: 3.8 10*3/uL — ABNORMAL LOW (ref 4.0–10.5)

## 2016-04-03 LAB — URINALYSIS, ROUTINE W REFLEX MICROSCOPIC
BILIRUBIN URINE: NEGATIVE
Glucose, UA: NEGATIVE mg/dL
Hgb urine dipstick: NEGATIVE
KETONES UR: NEGATIVE mg/dL
Nitrite: NEGATIVE
PROTEIN: 30 mg/dL — AB
Specific Gravity, Urine: 1.03 (ref 1.005–1.030)
pH: 5 (ref 5.0–8.0)

## 2016-04-03 LAB — COMPREHENSIVE METABOLIC PANEL
ALT: 15 U/L (ref 14–54)
ANION GAP: 6 (ref 5–15)
AST: 16 U/L (ref 15–41)
Albumin: 4.4 g/dL (ref 3.5–5.0)
Alkaline Phosphatase: 43 U/L (ref 38–126)
BUN: 7 mg/dL (ref 6–20)
CHLORIDE: 107 mmol/L (ref 101–111)
CO2: 25 mmol/L (ref 22–32)
Calcium: 9.4 mg/dL (ref 8.9–10.3)
Creatinine, Ser: 0.65 mg/dL (ref 0.44–1.00)
GFR calc non Af Amer: >60 mL/min (ref >60)
Glucose, Bld: 87 mg/dL (ref 65–99)
POTASSIUM: 4 mmol/L (ref 3.5–5.1)
SODIUM: 138 mmol/L (ref 135–145)
Total Bilirubin: 0.7 mg/dL (ref 0.3–1.2)
Total Protein: 8.2 g/dL — ABNORMAL HIGH (ref 6.5–8.1)

## 2016-04-03 LAB — POC URINE PREG, ED: Preg Test, Ur: NEGATIVE

## 2016-04-03 LAB — LIPASE, BLOOD: LIPASE: 36 U/L (ref 11–51)

## 2016-04-03 MED ORDER — ONDANSETRON 8 MG PO TBDP: ORAL_TABLET | ORAL | 0 refills | Status: DC

## 2016-04-03 MED ORDER — ONDANSETRON HCL 4 MG/2ML IJ SOLN
4.0000 mg | Freq: Once | INTRAMUSCULAR | Status: AC
  Administered 2016-04-03: 4 mg via INTRAVENOUS
  Filled 2016-04-03: qty 2

## 2016-04-03 MED ORDER — SODIUM CHLORIDE 0.9 % IV BOLUS (SEPSIS)
1000.0000 mL | Freq: Once | INTRAVENOUS | Status: AC
  Administered 2016-04-03: 1000 mL via INTRAVENOUS

## 2016-04-03 MED ORDER — KETOROLAC TROMETHAMINE 30 MG/ML IJ SOLN
30.0000 mg | Freq: Once | INTRAMUSCULAR | Status: AC
  Administered 2016-04-03: 30 mg via INTRAVENOUS
  Filled 2016-04-03: qty 1

## 2016-04-03 NOTE — Discharge Instructions (Signed)
Zofran as prescribed as needed for nausea.  Clear liquid diet for the next 12 hours, then slowly advance to crackers and toast. If tolerated, advance further from there.  Return to the emergency department if you develop severe abdominal pain, high fevers, bloody stools, or other new and concerning symptoms.

## 2016-04-03 NOTE — ED Notes (Signed)
MD at bedside. 

## 2016-04-03 NOTE — ED Provider Notes (Signed)
[facility]-EMERGENCY DEPT Provider Note   CSN: [id number] Arrival date & time: 04/03/16  1400  By signing my name below, I, Marnette Burgess Long, attest that this documentation has been prepared under the direction and in the presence of Geoffery Lyons, MD . Electronically Signed: Marnette Burgess Long, Scribe. 04/03/2016. 3:01 PM.  History   Chief Complaint Chief Complaint  Patient presents with  . Emesis  . Abdominal Pain   The history is provided by the patient. No language interpreter was used.  Abdominal Pain   The current episode started 12 to 24 hours ago. The problem occurs hourly. The pain is located in the generalized abdominal region. Associated symptoms include fever, nausea and vomiting. Pertinent negatives include hematochezia. Nothing relieves the symptoms.    HPI Comments:  ERDINE HULEN is a 26 y.o. female with a PMHx of Anemia, Anxiety, Asthma, and Migraines, who presents to the Emergency Department complaining of sudden onset, generalized, lower abdominal pain beginning last night. She states the pain started last evening with 5 episodes of diarrhea and 4 episodes of emesis following until 3:00AM today. Pt has associated symptoms of nausea and unmeasured fever. No sick contact with similar symptoms stated. She tried tylenol with no relief of her symptoms. No h/o abdominal surgeries. No exacerbating factors noted. Pt denies hematochezia and any other complaints at this time. LMP~ beginning of February.    Past Medical History:  Diagnosis Date  . Anemia   . Anxiety   . Asthma    albuterol prn  . Former smoker   . Headache    migraines  . Hx of tonsillectomy    at 26 y.o.  . Hx of trichomoniasis   . Hx of varicella     Patient Active Problem List   Diagnosis Date Noted  . Pregnancy 09/05/2014  . Placental abruption in third trimester 09/04/2014  . Subchorionic hemorrhage in third trimester 07/26/2014  . [redacted] weeks gestation of pregnancy   . Preterm contractions 07/24/2014    . [redacted] weeks gestation of pregnancy   . Traumatic injury during pregnancy in third trimester   . Velamentous insertion of umbilical cord   . [redacted] weeks gestation of pregnancy   . Encounter for fetal anatomic survey     Past Surgical History:  Procedure Laterality Date  . TONSILLECTOMY      OB History    Gravida Para Term Preterm AB Living   1 1 1     1    SAB TAB Ectopic Multiple Live Births         0 1       Home Medications    Prior to Admission medications   Medication Sig Start Date End Date Taking? Authorizing Provider  albuterol (PROVENTIL HFA;VENTOLIN HFA) 108 (90 BASE) MCG/ACT inhaler Inhale 2 puffs into the lungs every 6 (six) hours as needed for wheezing or shortness of breath.    Historical Provider, MD  ibuprofen (ADVIL,MOTRIN) 800 MG tablet Take 800 mg by mouth every 8 (eight) hours as needed for mild pain or moderate pain.    Historical Provider, MD    Family History Family History  Problem Relation Age of Onset  . Hypertension Mother   . Hypertension Maternal Grandmother   . Heart attack Maternal Grandmother   . Hypertension Maternal Grandfather   . Cancer Maternal Grandfather   . Diabetes Paternal Grandmother     Social History Social History  Substance Use Topics  . Smoking status: Never Smoker  . Smokeless  tobacco: Never Used  . Alcohol use No     Allergies   Patient has no known allergies.   Review of Systems Review of Systems  Constitutional: Positive for fever.  Gastrointestinal: Positive for abdominal pain, nausea and vomiting. Negative for blood in stool and hematochezia.  All other systems reviewed and are negative.    Physical Exam Updated Vital Signs BP 104/76 (BP Location: Left Arm)   Pulse 68   Temp 97.9 F (36.6 C) (Oral)   Resp 18   LMP 02/28/2016   SpO2 100%   Physical Exam  Constitutional: She appears well-developed and well-nourished. No distress.  HENT:  Head: Normocephalic and atraumatic.  Mouth/Throat:  Oropharynx is clear and moist. No oropharyngeal exudate.  Eyes: Conjunctivae are normal. Pupils are equal, round, and reactive to light. Right eye exhibits no discharge. Left eye exhibits no discharge. No scleral icterus.  Neck: Normal range of motion. Neck supple. No thyromegaly present.  Cardiovascular: Normal rate, regular rhythm, normal heart sounds and intact distal pulses.  Exam reveals no gallop and no friction rub.   No murmur heard. Pulmonary/Chest: Effort normal and breath sounds normal. No stridor. No respiratory distress. She has no wheezes. She has no rales.  Abdominal: Soft. Bowel sounds are normal. She exhibits no distension. There is no tenderness. There is no rebound and no guarding.  Musculoskeletal: She exhibits no edema.  Lymphadenopathy:    She has no cervical adenopathy.  Neurological: She is alert. Coordination normal.  Skin: Skin is warm and dry. No rash noted. She is not diaphoretic. No pallor.  Psychiatric: She has a normal mood and affect.  Nursing note and vitals reviewed.    ED Treatments / Results  DIAGNOSTIC STUDIES:  Oxygen Saturation is 100% on RA, normal by my interpretation.    COORDINATION OF CARE:  3:00 PM Discussed treatment plan with pt at bedside including fluids, lab work, and anti-emetics and pt agreed to plan.  Labs (all labs ordered are listed, but only abnormal results are displayed) Labs Reviewed  LIPASE, BLOOD  COMPREHENSIVE METABOLIC PANEL  CBC  URINALYSIS, ROUTINE W REFLEX MICROSCOPIC    EKG  EKG Interpretation None       Radiology No results found.  Procedures Procedures (including critical care time)  Medications Ordered in ED Medications - No data to display   Initial Impression / Assessment and Plan / ED Course  I have reviewed the triage vital signs and the nursing notes.  Pertinent labs & imaging results that were available during my care of the patient were reviewed by me and considered in my medical  decision making (see chart for details).  Patient's presentation, physical exam, and workup consistent with a viral gastroenteritis. She is feeling better after fluids and medications administered. She will be discharged with Zofran and is to return as needed if symptoms worsen or change.  Final Clinical Impressions(s) / ED Diagnoses   Final diagnoses:  None    New Prescriptions New Prescriptions   No medications on file   I personally performed the services described in this documentation, which was scribed in my presence. The recorded information has been reviewed and is accurate.        Geoffery Lyonsouglas Larah Kuntzman, MD 04/03/16 1655

## 2016-04-03 NOTE — ED Triage Notes (Signed)
Pt reports last night she had a sudden onset of lower abd cramping with n/v/d.

## 2016-04-06 ENCOUNTER — Emergency Department (HOSPITAL_COMMUNITY)
Admission: EM | Admit: 2016-04-06 | Discharge: 2016-04-06 | Disposition: A | Discharge status: Home / Self Care | Payer: Self-pay | Attending: Physician Assistant | Admitting: Physician Assistant

## 2016-04-06 ENCOUNTER — Emergency Department (HOSPITAL_COMMUNITY): Payer: Self-pay

## 2016-04-06 ENCOUNTER — Encounter (HOSPITAL_COMMUNITY): Payer: Self-pay | Admitting: Emergency Medicine

## 2016-04-06 DIAGNOSIS — R197 Diarrhea, unspecified: Secondary | ICD-10-CM | POA: Insufficient documentation

## 2016-04-06 DIAGNOSIS — R1084 Generalized abdominal pain: Secondary | ICD-10-CM | POA: Insufficient documentation

## 2016-04-06 DIAGNOSIS — J45909 Unspecified asthma, uncomplicated: Secondary | ICD-10-CM | POA: Insufficient documentation

## 2016-04-06 LAB — CBC
HCT: 32.9 % — ABNORMAL LOW (ref 36.0–46.0)
Hemoglobin: 10.4 g/dL — ABNORMAL LOW (ref 12.0–15.0)
MCH: 24.9 pg — ABNORMAL LOW (ref 26.0–34.0)
MCHC: 31.6 g/dL (ref 30.0–36.0)
MCV: 78.9 fL (ref 78.0–100.0)
PLATELETS: 267 10*3/uL (ref 150–400)
RBC: 4.17 MIL/uL (ref 3.87–5.11)
RDW: 15.9 % — AB (ref 11.5–15.5)
WBC: 3.1 10*3/uL — AB (ref 4.0–10.5)

## 2016-04-06 LAB — PREGNANCY, URINE: Preg Test, Ur: NEGATIVE

## 2016-04-06 LAB — POC OCCULT BLOOD, ED: Fecal Occult Bld: NEGATIVE

## 2016-04-06 LAB — URINALYSIS, ROUTINE W REFLEX MICROSCOPIC
BILIRUBIN URINE: NEGATIVE
Glucose, UA: NEGATIVE mg/dL
Hgb urine dipstick: NEGATIVE
KETONES UR: NEGATIVE mg/dL
Nitrite: NEGATIVE
PH: 6 (ref 5.0–8.0)
Protein, ur: NEGATIVE mg/dL
SPECIFIC GRAVITY, URINE: 1.008 (ref 1.005–1.030)

## 2016-04-06 LAB — COMPREHENSIVE METABOLIC PANEL
ALK PHOS: 41 U/L (ref 38–126)
ALT: 14 U/L (ref 14–54)
AST: 17 U/L (ref 15–41)
Albumin: 3.8 g/dL (ref 3.5–5.0)
Anion gap: 7 (ref 5–15)
BUN: 7 mg/dL (ref 6–20)
CALCIUM: 9.2 mg/dL (ref 8.9–10.3)
CO2: 25 mmol/L (ref 22–32)
CREATININE: 0.6 mg/dL (ref 0.44–1.00)
Chloride: 106 mmol/L (ref 101–111)
GFR calc Af Amer: >60 mL/min (ref >60)
Glucose, Bld: 88 mg/dL (ref 65–99)
Potassium: 4.1 mmol/L (ref 3.5–5.1)
Sodium: 138 mmol/L (ref 135–145)
TOTAL PROTEIN: 7 g/dL (ref 6.5–8.1)

## 2016-04-06 LAB — LIPASE, BLOOD: Lipase: 32 U/L (ref 11–51)

## 2016-04-06 MED ORDER — SODIUM CHLORIDE 0.9 % IV BOLUS (SEPSIS)
1000.0000 mL | Freq: Once | INTRAVENOUS | Status: AC
  Administered 2016-04-06: 1000 mL via INTRAVENOUS

## 2016-04-06 MED ORDER — DICYCLOMINE HCL 20 MG PO TABS: 20.0000 mg | ORAL_TABLET | Freq: Two times a day (BID) | ORAL | 0 refills | Status: DC

## 2016-04-06 MED ORDER — IOPAMIDOL (ISOVUE-300) INJECTION 61%
INTRAVENOUS | Status: AC
  Administered 2016-04-06: 100 mL
  Filled 2016-04-06: qty 100

## 2016-04-06 MED ORDER — LOPERAMIDE HCL 2 MG PO CAPS: 2.0000 mg | ORAL_CAPSULE | Freq: Two times a day (BID) | ORAL | 0 refills | Status: DC | PRN

## 2016-04-06 MED ORDER — ONDANSETRON HCL 4 MG/2ML IJ SOLN
4.0000 mg | Freq: Once | INTRAMUSCULAR | Status: AC
  Administered 2016-04-06: 4 mg via INTRAVENOUS
  Filled 2016-04-06: qty 2

## 2016-04-06 MED ORDER — FENTANYL CITRATE (PF) 100 MCG/2ML IJ SOLN
25.0000 ug | Freq: Once | INTRAMUSCULAR | Status: AC
  Administered 2016-04-06: 25 ug via INTRAVENOUS
  Filled 2016-04-06: qty 2

## 2016-04-06 NOTE — ED Triage Notes (Signed)
Pt. Stated I was here on Thursday with the same symptoms, except now I have a headache, and black stool.  I have a headache and feel weak.

## 2016-04-06 NOTE — ED Notes (Signed)
Pt encouraged to drink fluids. Pt given water. Tolerating well. Will continue to monitor.

## 2016-04-06 NOTE — ED Provider Notes (Signed)
[facility]-EMERGENCY DEPT Provider Note   CSN: [id number] Arrival date & time: 04/06/16  1712     History   Chief Complaint Chief Complaint  Patient presents with  . Diarrhea  . Headache  . Weakness    HPI Heather Malone is a 26 y.o. female.  HPI   She is a 26 year old female with past medical history significant for anxiety and anemia presenting today with diarrhea. Patient was seen a couple days ago for nausea vomiting diarrhea. The vomiting is since resolved. However she still had some diarrhea. She's had some dark stools. No bright red blood per rectum. No vomiting of black or dark material.  No history of diverticulosis.  Past Medical History:  Diagnosis Date  . Anemia   . Anxiety   . Asthma    albuterol prn  . Former smoker   . Headache    migraines  . Hx of tonsillectomy    at 26 y.o.  . Hx of trichomoniasis   . Hx of varicella     Patient Active Problem List   Diagnosis Date Noted  . Pregnancy 09/05/2014  . Placental abruption in third trimester 09/04/2014  . Subchorionic hemorrhage in third trimester 07/26/2014  . [redacted] weeks gestation of pregnancy   . Preterm contractions 07/24/2014  . [redacted] weeks gestation of pregnancy   . Traumatic injury during pregnancy in third trimester   . Velamentous insertion of umbilical cord   . [redacted] weeks gestation of pregnancy   . Encounter for fetal anatomic survey     Past Surgical History:  Procedure Laterality Date  . TONSILLECTOMY      OB History    Gravida Para Term Preterm AB Living   1 1 1     1    SAB TAB Ectopic Multiple Live Births         0 1       Home Medications    Prior to Admission medications   Medication Sig Start Date End Date Taking? Authorizing Provider  albuterol (PROVENTIL HFA;VENTOLIN HFA) 108 (90 BASE) MCG/ACT inhaler Inhale 2 puffs into the lungs every 6 (six) hours as needed for wheezing or shortness of breath.   Yes Historical Provider, MD  dicyclomine (BENTYL) 20 MG tablet Take 1 tablet  (20 mg total) by mouth 2 (two) times daily. 04/06/16   Rolen Conger Lyn Lynnleigh Soden, MD  loperamide (IMODIUM) 2 MG capsule Take 1 capsule (2 mg total) by mouth 2 (two) times daily as needed for diarrhea or loose stools. 04/06/16   Cerinity Zynda Lyn Marialena Wollen, MD  ondansetron (ZOFRAN ODT) 8 MG disintegrating tablet 8mg  ODT q4 hours prn nausea Patient not taking: Reported on 04/06/2016 04/03/16   Geoffery Lyons, MD    Family History Family History  Problem Relation Age of Onset  . Hypertension Mother   . Hypertension Maternal Grandmother   . Heart attack Maternal Grandmother   . Hypertension Maternal Grandfather   . Cancer Maternal Grandfather   . Diabetes Paternal Grandmother     Social History Social History  Substance Use Topics  . Smoking status: Never Smoker  . Smokeless tobacco: Never Used  . Alcohol use No     Allergies   Patient has no known allergies.   Review of Systems Review of Systems  Constitutional: Positive for fatigue. Negative for fever.  Gastrointestinal: Positive for diarrhea. Negative for blood in stool, constipation, rectal pain and vomiting.  Neurological: Positive for light-headedness.  All other systems reviewed and are negative.  Physical Exam Updated Vital Signs BP 130/98   Pulse 60   Temp 98.4 F (36.9 C)   Resp 16   Ht 5\' 2"  (1.575 m)   Wt 140 lb (63.5 kg)   LMP 02/27/2016   SpO2 99%   BMI 25.61 kg/m   Physical Exam  Constitutional: She is oriented to person, place, and time. She appears well-developed and well-nourished.  HENT:  Head: Normocephalic and atraumatic.  Eyes: Right eye exhibits no discharge.  Cardiovascular: Normal rate, regular rhythm and normal heart sounds.   No murmur heard. Pulmonary/Chest: Effort normal and breath sounds normal. She has no wheezes. She has no rales.  Abdominal: Soft. She exhibits no distension. There is tenderness.  Diffuse tenderness  Genitourinary: Rectal exam shows guaiac negative stool.  Genitourinary  Comments: Normal rectum  Neurological: She is oriented to person, place, and time.  Skin: Skin is warm and dry. She is not diaphoretic.  Psychiatric: She has a normal mood and affect.  Nursing note and vitals reviewed.    ED Treatments / Results  Labs (all labs ordered are listed, but only abnormal results are displayed) Labs Reviewed  COMPREHENSIVE METABOLIC PANEL - Abnormal; Notable for the following:       Result Value   Total Bilirubin <0.1 (*)    All other components within normal limits  CBC - Abnormal; Notable for the following:    WBC 3.1 (*)    Hemoglobin 10.4 (*)    HCT 32.9 (*)    MCH 24.9 (*)    RDW 15.9 (*)    All other components within normal limits  URINALYSIS, ROUTINE W REFLEX MICROSCOPIC - Abnormal; Notable for the following:    Color, Urine STRAW (*)    Leukocytes, UA TRACE (*)    Bacteria, UA RARE (*)    Squamous Epithelial / LPF 0-5 (*)    All other components within normal limits  LIPASE, BLOOD  PREGNANCY, URINE  POC OCCULT BLOOD, ED  POC OCCULT BLOOD, ED    EKG  EKG Interpretation None       Radiology Ct Abdomen Pelvis W Contrast  Result Date: 04/06/2016 CLINICAL DATA:  Diffuse abdominal pain and nausea for 4 days. EXAM: CT ABDOMEN AND PELVIS WITH CONTRAST TECHNIQUE: Multidetector CT imaging of the abdomen and pelvis was performed using the standard protocol following bolus administration of intravenous contrast. CONTRAST:  ISOVUE-300 IOPAMIDOL (ISOVUE-300) INJECTION 61% COMPARISON:  None. FINDINGS: Lower chest:  Unremarkable. Hepatobiliary: No focal abnormality within the liver parenchyma. There is no evidence for gallstones, gallbladder wall thickening, or pericholecystic fluid. No intrahepatic or extrahepatic biliary dilation. Pancreas: No focal mass lesion. No dilatation of the main duct. No intraparenchymal cyst. No peripancreatic edema. Spleen: No splenomegaly. No focal mass lesion. Adrenals/Urinary Tract: No adrenal nodule or mass.  Kidneys are unremarkable. No evidence for hydroureter. The urinary bladder appears normal for the degree of distention. Stomach/Bowel: Stomach is nondistended. No gastric wall thickening. No evidence of outlet obstruction. Duodenum is normally positioned as is the ligament of Treitz. No small bowel wall thickening. No small bowel dilatation. The terminal ileum is normal. The appendix is normal. No gross colonic mass. No colonic wall thickening. No substantial diverticular change. Vascular/Lymphatic: No abdominal aortic aneurysm. No abdominal aortic atherosclerotic calcification. There is no gastrohepatic or hepatoduodenal ligament lymphadenopathy. No intraperitoneal or retroperitoneal lymphadenopathy. No pelvic sidewall lymphadenopathy. Reproductive: 4 cm right adnexal cyst noted. Uterus unremarkable. Left ovary unremarkable. Other: Tiny volume free fluid noted in the right pelvis.  Musculoskeletal: Bone windows reveal no worrisome lytic or sclerotic osseous lesions. IMPRESSION: 1. No acute findings in the abdomen or pelvis. Specifically, no features to suggest colitis by CT. 2. 4 cm benign appearing right adnexal cyst. In a patient of this age and for a lesion of this size, no imaging follow-up warranted by consensus criteria. This recommendation follows ACR consensus guidelines: White Paper of the ACR Incidental Findings Committee II on Adnexal Findings. J Am Coll Radiol 2013:10:675-681. 3. Trace free fluid in the pelvis. This can be a physiologic finding in a premenopausal female. Electronically Signed   By: Kennith CenterEric  Mansell M.D.   On: 04/06/2016 22:47    Procedures Procedures (including critical care time)  Medications Ordered in ED Medications  fentaNYL (SUBLIMAZE) injection 25 mcg (25 mcg Intravenous Given 04/06/16 2008)  ondansetron (ZOFRAN) injection 4 mg (4 mg Intravenous Given 04/06/16 2008)  sodium chloride 0.9 % bolus 1,000 mL (0 mLs Intravenous Stopped 04/06/16 2127)  iopamidol (ISOVUE-300) 61 %  injection (100 mLs  Contrast Given 04/06/16 2218)     Initial Impression / Assessment and Plan / ED Course  I have reviewed the triage vital signs and the nursing notes.  Pertinent labs & imaging results that were available during my care of the patient were reviewed by me and considered in my medical decision making (see chart for details).     Patient is a 26 year old female presenting with diarrhea. Patient has normal vital signs, normal labs. However has tenderness on exam. Patient complained of black stool however still appeared normal on exam and it was guaiac negative. Patient had less than 1 g he will drop since labs 2 days ago. Given that she has no tachycardia, doubt significant GI bleed at this time. We will get CT abdomen pelvis given her tenderness.  No tachycardia, stable vitals. Taking PO and normal labs. CT shows no abnormality.  No reason for admission at this time. Strict return precautions given.  Final Clinical Impressions(s) / ED Diagnoses   Final diagnoses:  Diarrhea, unspecified type    New Prescriptions Discharge Medication List as of 04/06/2016 11:34 PM    START taking these medications   Details  dicyclomine (BENTYL) 20 MG tablet Take 1 tablet (20 mg total) by mouth 2 (two) times daily., Starting Sun 04/06/2016, Print    loperamide (IMODIUM) 2 MG capsule Take 1 capsule (2 mg total) by mouth 2 (two) times daily as needed for diarrhea or loose stools., Starting Sun 04/06/2016, Print         Irma Roulhac Randall AnLyn Loukas Antonson, MD 04/07/16 [id number]

## 2016-04-06 NOTE — Discharge Instructions (Signed)
Please return with any dark stools, lightheadedness, or if you are unable stay hydrated at home.

## 2016-04-06 NOTE — ED Notes (Signed)
Patient transported to CT 

## 2016-07-22 ENCOUNTER — Emergency Department (HOSPITAL_BASED_OUTPATIENT_CLINIC_OR_DEPARTMENT_OTHER)
Admission: EM | Admit: 2016-07-22 | Discharge: 2016-07-22 | Disposition: A | Discharge status: Home / Self Care | Payer: No Typology Code available for payment source | Attending: Emergency Medicine | Admitting: Emergency Medicine

## 2016-07-22 ENCOUNTER — Emergency Department (HOSPITAL_BASED_OUTPATIENT_CLINIC_OR_DEPARTMENT_OTHER): Payer: No Typology Code available for payment source

## 2016-07-22 ENCOUNTER — Encounter (HOSPITAL_BASED_OUTPATIENT_CLINIC_OR_DEPARTMENT_OTHER): Payer: Self-pay

## 2016-07-22 DIAGNOSIS — S161XXA Strain of muscle, fascia and tendon at neck level, initial encounter: Secondary | ICD-10-CM | POA: Diagnosis not present

## 2016-07-22 DIAGNOSIS — S1980XA Other specified injuries of unspecified part of neck, initial encounter: Secondary | ICD-10-CM | POA: Diagnosis present

## 2016-07-22 DIAGNOSIS — Y999 Unspecified external cause status: Secondary | ICD-10-CM | POA: Diagnosis not present

## 2016-07-22 DIAGNOSIS — Y92410 Unspecified street and highway as the place of occurrence of the external cause: Secondary | ICD-10-CM | POA: Diagnosis not present

## 2016-07-22 DIAGNOSIS — Y939 Activity, unspecified: Secondary | ICD-10-CM | POA: Insufficient documentation

## 2016-07-22 DIAGNOSIS — J45909 Unspecified asthma, uncomplicated: Secondary | ICD-10-CM | POA: Insufficient documentation

## 2016-07-22 LAB — PREGNANCY, URINE: PREG TEST UR: NEGATIVE

## 2016-07-22 MED ORDER — IBUPROFEN 600 MG PO TABS: 600.0000 mg | ORAL_TABLET | Freq: Four times a day (QID) | ORAL | 0 refills | Status: DC | PRN

## 2016-07-22 MED ORDER — HYDROCODONE-ACETAMINOPHEN 5-325 MG PO TABS
1.0000 | ORAL_TABLET | Freq: Once | ORAL | Status: AC
  Administered 2016-07-22: 1 via ORAL
  Filled 2016-07-22: qty 1

## 2016-07-22 MED ORDER — DIAZEPAM 5 MG PO TABS
5.0000 mg | ORAL_TABLET | Freq: Once | ORAL | Status: AC
  Administered 2016-07-22: 5 mg via ORAL
  Filled 2016-07-22: qty 1

## 2016-07-22 MED ORDER — DIAZEPAM 5 MG PO TABS: 5.0000 mg | ORAL_TABLET | Freq: Two times a day (BID) | ORAL | 0 refills | Status: DC

## 2016-07-22 MED ORDER — HYDROCODONE-ACETAMINOPHEN 5-325 MG PO TABS: 1.0000 | ORAL_TABLET | ORAL | 0 refills | Status: DC | PRN

## 2016-07-22 MED FILL — HYDROCODON-APAP 5-325: 5-325 | 2 days supply | Qty: 10 | Fill #0

## 2016-07-22 MED FILL — IBUPROFEN 600 MG TABLET: 600 | 7 days supply | Qty: 30 | Fill #0

## 2016-07-22 MED FILL — diazePAM 5 MG TABS: 5 | 5 days supply | Qty: 10 | Fill #0
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
## 2016-07-22 NOTE — ED Notes (Signed)
Pt directed to pharmacy to pick up Rx. Work note given. F/u care discussed
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
## 2016-07-25 ENCOUNTER — Ambulatory Visit (INDEPENDENT_AMBULATORY_CARE_PROVIDER_SITE_OTHER): Payer: Self-pay | Admitting: Family Medicine

## 2016-07-25 ENCOUNTER — Encounter: Payer: Self-pay | Admitting: Family Medicine

## 2016-07-25 DIAGNOSIS — S199XXA Unspecified injury of neck, initial encounter: Secondary | ICD-10-CM

## 2016-07-25 MED ORDER — METHOCARBAMOL 500 MG PO TABS: 500.0000 mg | ORAL_TABLET | Freq: Three times a day (TID) | ORAL | 1 refills | Status: DC | PRN

## 2016-07-25 MED ORDER — PREDNISONE 10 MG PO TABS: ORAL_TABLET | ORAL | 0 refills | Status: DC

## 2016-07-25 MED ORDER — OXYCODONE-ACETAMINOPHEN 5-325 MG PO TABS: 1.0000 | ORAL_TABLET | Freq: Four times a day (QID) | ORAL | 0 refills | Status: DC | PRN

## 2016-07-25 MED FILL — predniSONE 10 MG TABS: 10 | 6 days supply | Qty: 21 | Fill #0

## 2016-07-25 MED FILL — OXYCOD/ACETAMINOPHEN 5-325M: 5-325 | 5 days supply | Qty: 20 | Fill #0

## 2016-07-25 MED FILL — METHOCARBAMOL 500 MG TABLET: 500 | 20 days supply | Qty: 60 | Fill #0
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
## 2016-07-25 NOTE — Patient Instructions (Signed)
You have a severe trapezius strain. Prednisone 6 day dose pack to relieve inflammation and spasms. Aleve 2 tabs twice a day with food for pain and inflammation - start day AFTER finishing prednisone. Robaxin three times a day as needed for muscle spasms (can make you sleepy - if so do not drive while taking this). Percocet as needed for severe pain (no driving on this medicine). Consider cervical collar if severely painful. Simple range of motion exercises within limits of pain to prevent further stiffness when tolerated. Consider physical therapy for stretching, exercises, traction, and modalities in the future. Heat 15 minutes at a time 3-4 times a day to help with spasms. Watch head position when on computers, texting, when sleeping in bed - should in line with back to prevent further spasms. If not improving we will consider an MRI. Follow up with me in 1 week for reevaluation.

## 2016-07-26 DIAGNOSIS — S199XXD Unspecified injury of neck, subsequent encounter: Secondary | ICD-10-CM

## 2016-07-26 HISTORY — DX: Unspecified injury of neck, subsequent encounter: S19.9XXD

## 2016-07-26 NOTE — Assessment & Plan Note (Signed)
independently reviewed radiographs and no fracture or other abnormalities.  Consistent with severe trapezius strain.  Discussed options.  She will start with prednisone, robaxin, percocet.  Simple motion exercises, heat.  Ergonomic issues discussed.  F/u in 1 week.  Consider physical therapy, MRI depending on her improvement.

## 2016-08-01 ENCOUNTER — Ambulatory Visit (INDEPENDENT_AMBULATORY_CARE_PROVIDER_SITE_OTHER): Payer: BLUE CROSS/BLUE SHIELD | Admitting: Family Medicine

## 2016-08-01 ENCOUNTER — Encounter: Payer: Self-pay | Admitting: Family Medicine

## 2016-08-01 DIAGNOSIS — S199XXD Unspecified injury of neck, subsequent encounter: Secondary | ICD-10-CM | POA: Diagnosis not present

## 2016-08-01 NOTE — Patient Instructions (Signed)
You have a trapezius strain. Ibuprofen as you have been as needed. Robaxin three times a day as needed for muscle spasms (can make you sleepy - if so do not drive while taking this). Home exercises and stretches in the handout until pain resolves. Consider physical therapy for stretching, exercises, traction, and modalities in the future. Heat 15 minutes at a time 3-4 times a day to help with spasms. Watch head position when on computers, texting, when sleeping in bed - should in line with back to prevent further spasms. Follow up with me in 5 weeks or as needed if you're doing well.

## 2016-08-05 NOTE — Assessment & Plan Note (Signed)
Radiographs negative.  Consistent with severe trapezius strain.  Clinically improving.  S/p prednisone, robaxin, percocet.  Continue with robaxin, ibuprofen if needed.  Shown home exercises and stretches to do daily.  Heat for spasms.  Ergonomic issues discussed.  F/u in 5 weeks or prn if doing well.
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
## 2016-09-09 ENCOUNTER — Ambulatory Visit: Payer: BLUE CROSS/BLUE SHIELD | Admitting: Family Medicine

## 2017-06-30 ENCOUNTER — Encounter (HOSPITAL_BASED_OUTPATIENT_CLINIC_OR_DEPARTMENT_OTHER): Payer: Self-pay | Admitting: *Deleted

## 2017-06-30 ENCOUNTER — Emergency Department (HOSPITAL_BASED_OUTPATIENT_CLINIC_OR_DEPARTMENT_OTHER)
Admission: EM | Admit: 2017-06-30 | Discharge: 2017-06-30 | Disposition: A | Discharge status: Home / Self Care | Payer: BLUE CROSS/BLUE SHIELD | Attending: Emergency Medicine | Admitting: Emergency Medicine

## 2017-06-30 ENCOUNTER — Other Ambulatory Visit: Payer: Self-pay

## 2017-06-30 DIAGNOSIS — R51 Headache: Secondary | ICD-10-CM | POA: Diagnosis present

## 2017-06-30 DIAGNOSIS — R519 Headache, unspecified: Secondary | ICD-10-CM

## 2017-06-30 LAB — URINALYSIS, ROUTINE W REFLEX MICROSCOPIC
Bilirubin Urine: NEGATIVE
Glucose, UA: NEGATIVE mg/dL
Hgb urine dipstick: NEGATIVE
KETONES UR: NEGATIVE mg/dL
NITRITE: NEGATIVE
PH: 7 (ref 5.0–8.0)
Protein, ur: NEGATIVE mg/dL
Specific Gravity, Urine: 1.025 (ref 1.005–1.030)

## 2017-06-30 LAB — URINALYSIS, MICROSCOPIC (REFLEX): RBC / HPF: NONE SEEN RBC/hpf (ref 0–5)

## 2017-06-30 LAB — PREGNANCY, URINE: PREG TEST UR: NEGATIVE

## 2017-06-30 MED ORDER — SODIUM CHLORIDE 0.9 % IV BOLUS: 500.0000 mL | Freq: Once | INTRAVENOUS | Status: DC

## 2017-06-30 MED ORDER — METOCLOPRAMIDE HCL 5 MG/ML IJ SOLN: 10.0000 mg | Freq: Once | INTRAMUSCULAR | Status: DC

## 2017-06-30 MED ORDER — KETOROLAC TROMETHAMINE 30 MG/ML IJ SOLN
30.0000 mg | Freq: Once | INTRAMUSCULAR | Status: AC
Start: 2017-06-30 — End: 2017-06-30
  Administered 2017-06-30: 30 mg via INTRAMUSCULAR
  Filled 2017-06-30: qty 1

## 2017-06-30 MED ORDER — DIPHENHYDRAMINE HCL 50 MG/ML IJ SOLN: 25.0000 mg | Freq: Once | INTRAMUSCULAR | Status: DC

## 2017-06-30 NOTE — ED Provider Notes (Signed)
[facility] EMERGENCY DEPARTMENT Provider Note   CSN: [id number] Arrival date & time: 06/30/17  1249     History   Chief Complaint Chief Complaint  Patient presents with  . Headache    HPI Heather Malone is a 27 y.o. female.  Patient presents with acute onset of headache upon awaking 3 days ago.  Patient describes headache in bilateral temples and the back of her head.  She has associated colorful spots in her vision at times.  Patient has a history of similar headaches with similar features.  She denies any head injuries.  She has had nausea but no vomiting.  She has positive photophobia and phonophobia.  Over-the-counter medications have not been helping her.  No weakness, numbness, or tingling in her arms or legs.  She denies fevers or confusion.  No neck pain or pain with movement of the neck.  Course is constant. No urinary sx.      Past Medical History:  Diagnosis Date  . Anemia   . Anxiety   . Asthma    albuterol prn  . Former smoker   . Headache    migraines  . Hx of tonsillectomy    at 27 y.o.  . Hx of trichomoniasis   . Hx of varicella     Patient Active Problem List   Diagnosis Date Noted  . Neck injury, subsequent encounter 07/26/2016  . Pregnancy 09/05/2014  . Placental abruption in third trimester 09/04/2014  . Subchorionic hemorrhage in third trimester 07/26/2014  . [redacted] weeks gestation of pregnancy   . Preterm contractions 07/24/2014  . [redacted] weeks gestation of pregnancy   . Traumatic injury during pregnancy in third trimester   . Velamentous insertion of umbilical cord   . [redacted] weeks gestation of pregnancy   . Encounter for fetal anatomic survey     Past Surgical History:  Procedure Laterality Date  . TONSILLECTOMY       OB History    Gravida  1   Para  1   Term  1   Preterm      AB      Living  1     SAB      TAB      Ectopic      Multiple  0   Live Births  1            Home Medications    Prior to  Admission medications   Medication Sig Start Date End Date Taking? Authorizing Provider  methocarbamol (ROBAXIN) 500 MG tablet Take 1 tablet (500 mg total) by mouth every 8 (eight) hours as needed. 07/25/16   Hudnall, Azucena FallenShane R, MD  oxyCODONE-acetaminophen (PERCOCET/ROXICET) 5-325 MG tablet Take 1 tablet by mouth every 6 (six) hours as needed for severe pain. 07/25/16   Hudnall, Azucena FallenShane R, MD  predniSONE (DELTASONE) 10 MG tablet 6 tabs po day 1, 5 tabs po day 2, 4 tabs po day 3, 3 tabs po day 4, 2 tabs po day 5, 1 tab po day 6 07/25/16   Hudnall, Azucena FallenShane R, MD    Family History Family History  Problem Relation Age of Onset  . Hypertension Mother   . Hypertension Maternal Grandmother   . Heart attack Maternal Grandmother   . Hypertension Maternal Grandfather   . Cancer Maternal Grandfather   . Diabetes Paternal Grandmother     Social History Social History   Tobacco Use  . Smoking status: Never Smoker  . Smokeless tobacco:  Never Used  Substance Use Topics  . Alcohol use: No  . Drug use: No     Allergies   Patient has no known allergies.   Review of Systems Review of Systems  Constitutional: Negative for fever.  HENT: Negative for congestion, dental problem, rhinorrhea and sinus pressure.   Eyes: Positive for visual disturbance. Negative for photophobia, discharge and redness.  Respiratory: Negative for shortness of breath.   Cardiovascular: Negative for chest pain.  Gastrointestinal: Positive for nausea. Negative for vomiting.  Musculoskeletal: Negative for gait problem, neck pain and neck stiffness.  Skin: Negative for rash.  Neurological: Positive for headaches. Negative for syncope, speech difficulty, weakness, light-headedness and numbness.  Psychiatric/Behavioral: Negative for confusion.     Physical Exam Updated Vital Signs BP 114/82   Pulse 76   Temp 98.3 F (36.8 C) (Oral)   Resp 16   Ht 5\' 2"  (1.575 m)   Wt 70.3 kg (155 lb)   LMP 05/16/2017   SpO2 100%   BMI  28.35 kg/m   Physical Exam  Constitutional: She is oriented to person, place, and time. She appears well-developed and well-nourished.  HENT:  Head: Normocephalic and atraumatic.  Right Ear: Tympanic membrane, external ear and ear canal normal.  Left Ear: Tympanic membrane, external ear and ear canal normal.  Nose: Nose normal.  Mouth/Throat: Uvula is midline, oropharynx is clear and moist and mucous membranes are normal.  Eyes: Pupils are equal, round, and reactive to light. Conjunctivae, EOM and lids are normal. Right eye exhibits no nystagmus. Left eye exhibits no nystagmus.  Neck: Normal range of motion. Neck supple.  No meningeal signs.  Full range of motion of neck without pain.  Cardiovascular: Normal rate and regular rhythm.  Pulmonary/Chest: Effort normal and breath sounds normal.  Abdominal: Soft. There is no tenderness.  Musculoskeletal:       Cervical back: She exhibits normal range of motion, no tenderness and no bony tenderness.  Neurological: She is alert and oriented to person, place, and time. She has normal strength and normal reflexes. No cranial nerve deficit or sensory deficit. She displays a negative Romberg sign. Coordination and gait normal. GCS eye subscore is 4. GCS verbal subscore is 5. GCS motor subscore is 6.  Skin: Skin is warm and dry.  Psychiatric: She has a normal mood and affect.  Nursing note and vitals reviewed.    ED Treatments / Results  Labs (all labs ordered are listed, but only abnormal results are displayed) Labs Reviewed  URINALYSIS, ROUTINE W REFLEX MICROSCOPIC - Abnormal; Notable for the following components:      Result Value   APPearance HAZY (*)    Leukocytes, UA TRACE (*)    All other components within normal limits  URINALYSIS, MICROSCOPIC (REFLEX) - Abnormal; Notable for the following components:   Bacteria, UA MANY (*)    All other components within normal limits  PREGNANCY, URINE    EKG None  Radiology No results  found.  Procedures Procedures (including critical care time)  Medications Ordered in ED Medications  ketorolac (TORADOL) 30 MG/ML injection 30 mg (has no administration in time range)     Initial Impression / Assessment and Plan / ED Course  I have reviewed the triage vital signs and the nursing notes.  Pertinent labs & imaging results that were available during my care of the patient were reviewed by me and considered in my medical decision making (see chart for details).     Patient seen and  examined.  No concerning focal findings on exam.  Patient offered migraine cocktail and fluid bolus, reexam.  She states that she cannot stay here for another 45 minutes to have this completed.  She wishes to be discharged.  She agrees to a shot of IM Toradol prior to discharge.  Patient encouraged to return if she changes her mind.  Vital signs reviewed and are as follows: BP 114/82   Pulse 76   Temp 98.3 F (36.8 C) (Oral)   Resp 16   Ht 5\' 2"  (1.575 m)   Wt 70.3 kg (155 lb)   LMP 05/16/2017   SpO2 100%   BMI 28.35 kg/m    Patient counseled to return if they have weakness in their arms or legs, slurred speech, trouble walking or talking, confusion, trouble with their balance, or if they have any other concerns. Patient verbalizes understanding and agrees with plan.     Final Clinical Impressions(s) / ED Diagnoses   Final diagnoses:  Acute nonintractable headache, unspecified headache type   Patient without high-risk features of headache including: sudden onset/thunderclap HA, no similar headache in past, altered mental status, accompanying seizure, headache with exertion, age > 35, history of immunocompromise, neck or shoulder pain, fever, use of anticoagulation, family history of spontaneous SAH, concomitant drug use, toxic exposure.   Patient has a normal complete neurological exam, normal vital signs, normal level of consciousness, no signs of meningismus, is  well-appearing/non-toxic appearing, no signs of trauma.   Imaging with CT/MRI not indicated given history and physical exam findings.   No dangerous or life-threatening conditions suspected or identified by history, physical exam, and by work-up. No indications for hospitalization identified.    ED Discharge Orders    None       Renne Crigler, Cordelia Poche 06/30/17 1604    Arby Barrette, MD 06/30/17 [phone]

## 2017-06-30 NOTE — ED Triage Notes (Signed)
Headache x 3 days. Hx of headache but she has not been bothered with them since 2013.

## 2017-06-30 NOTE — Discharge Instructions (Signed)
Please read and follow all provided instructions.  Your diagnoses today include:  1. Acute nonintractable headache, unspecified headache type     Tests performed today include:  Vital signs. See below for your results today.   Medications:  In the Emergency Department you received:  Toradol - NSAID medication similar to ibuprofen  Take any prescribed medications only as directed.  Additional information:  Follow any educational materials contained in this packet.  You are having a headache. No specific cause was found today for your headache. It may have been a migraine or other cause of headache. Stress, anxiety, fatigue, and depression are common triggers for headaches.   Your headache today does not appear to be life-threatening or require hospitalization, but often the exact cause of headaches is not determined in the emergency department. Therefore, follow-up with your doctor is very important to find out what may have caused your headache and whether or not you need any further diagnostic testing or treatment.   Sometimes headaches can appear benign (not harmful), but then more serious symptoms can develop which should prompt an immediate re-evaluation by your doctor or the emergency department.  BE VERY CAREFUL not to take multiple medicines containing Tylenol (also called acetaminophen). Doing so can lead to an overdose which can damage your liver and cause liver failure and possibly death.   Follow-up instructions: Please follow-up with your primary care provider in the next 3 days for further evaluation of your symptoms.   Return instructions:   Please return to the Emergency Department if you experience worsening symptoms.  Return if the medications do not resolve your headache, if it recurs, or if you have multiple episodes of vomiting or cannot keep down fluids.  Return if you have a change from the usual headache.  RETURN IMMEDIATELY IF you:  Develop a sudden,  severe headache  Develop confusion or become poorly responsive or faint  Develop a fever above 100.90F or problem breathing  Have a change in speech, vision, swallowing, or understanding  Develop new weakness, numbness, tingling, incoordination in your arms or legs  Have a seizure  Please return if you have any other emergent concerns.  Additional Information:  Your vital signs today were: BP 114/82    Pulse 76    Temp 98.3 F (36.8 C) (Oral)    Resp 16    Ht 5\' 2"  (1.575 m)    Wt 70.3 kg (155 lb)    LMP 05/16/2017    SpO2 100%    BMI 28.35 kg/m  If your blood pressure (BP) was elevated above 135/85 this visit, please have this repeated by your doctor within one month. --------------

## 2017-07-10 ENCOUNTER — Encounter (HOSPITAL_BASED_OUTPATIENT_CLINIC_OR_DEPARTMENT_OTHER): Payer: Self-pay | Admitting: Emergency Medicine

## 2017-07-10 ENCOUNTER — Emergency Department (HOSPITAL_BASED_OUTPATIENT_CLINIC_OR_DEPARTMENT_OTHER)
Admission: EM | Admit: 2017-07-10 | Discharge: 2017-07-10 | Disposition: A | Discharge status: Home / Self Care | Payer: BLUE CROSS/BLUE SHIELD | Attending: Emergency Medicine | Admitting: Emergency Medicine

## 2017-07-10 ENCOUNTER — Other Ambulatory Visit: Payer: Self-pay

## 2017-07-10 DIAGNOSIS — M5441 Lumbago with sciatica, right side: Secondary | ICD-10-CM | POA: Insufficient documentation

## 2017-07-10 DIAGNOSIS — Z79899 Other long term (current) drug therapy: Secondary | ICD-10-CM | POA: Diagnosis not present

## 2017-07-10 DIAGNOSIS — J45909 Unspecified asthma, uncomplicated: Secondary | ICD-10-CM | POA: Diagnosis not present

## 2017-07-10 MED ORDER — KETOROLAC TROMETHAMINE 15 MG/ML IJ SOLN
15.0000 mg | Freq: Once | INTRAMUSCULAR | Status: AC
  Administered 2017-07-10: 15 mg via INTRAMUSCULAR
  Filled 2017-07-10: qty 1

## 2017-07-10 NOTE — ED Triage Notes (Addendum)
Pain and numbness in right leg that have been getting worse for a couple days. Starts in hip and goes to ankle. Episodes of severe pain. Pt has been seen for this previously and is taking Baclofen and Topiramate from Dr. Neale BurlyFreeman in [place].

## 2017-07-10 NOTE — ED Provider Notes (Signed)
[facility] EMERGENCY DEPARTMENT Provider Note   CSN: [id number] Arrival date & time: 07/10/17  4098     History   Chief Complaint Chief Complaint  Patient presents with  . Leg Pain    HPI Heather Malone is a 27 y.o. female.  HPI   Patient is a 27 year old female with history of anxiety, anemia, chronic migraines, who presents the emergency department today complaining of right lower back pain and right lower extremity pain which has been intermittent over the last month but seems to be worsening over the last few days.  She also reports some abnormal sensation to the right lower extremity and felt like her leg was weak when she woke up this morning.  She notes that her symptoms seem to be worse after sitting for prolonged periods of time or after lying in bed for prolonged periods of time.  States she took a baclofen this morning and symptoms seem to have improved somewhat and she was able to ambulate around her residence prior to arrival here.  Denies any saddle anesthesia.  No loss of control of bowel or bladder function.  No urinary retention.  No dysuria, frequency, hematuria or fevers.  No history of IV drug use or cancer.  No bilateral upper extremity numbness or weakness.  She does have a mild headache currently which is consistent with prior migraines however is not as severe as her prior migraines.  No vision changes lightheadedness or dizziness.  Patient sees neurology with Dr. Neale Burly for her chronic migraines.  States that she discussed her symptoms with him during her last visit last month and he started her on baclofen.  She has only taken this medication one time and this morning.  States that she has had similar pain in the past and was diagnosed with sciatica.  She had received muscle relaxers for this type of pain in the past which improved her symptoms.   Past Medical History:  Diagnosis Date  . Anemia   . Anxiety   . Asthma    albuterol prn  . Former  smoker   . Headache    migraines  . Hx of tonsillectomy    at 27 y.o.  . Hx of trichomoniasis   . Hx of varicella     Patient Active Problem List   Diagnosis Date Noted  . Neck injury, subsequent encounter 07/26/2016  . Pregnancy 09/05/2014  . Placental abruption in third trimester 09/04/2014  . Subchorionic hemorrhage in third trimester 07/26/2014  . [redacted] weeks gestation of pregnancy   . Preterm contractions 07/24/2014  . [redacted] weeks gestation of pregnancy   . Traumatic injury during pregnancy in third trimester   . Velamentous insertion of umbilical cord   . [redacted] weeks gestation of pregnancy   . Encounter for fetal anatomic survey     Past Surgical History:  Procedure Laterality Date  . TONSILLECTOMY       OB History    Gravida  1   Para  1   Term  1   Preterm      AB      Living  1     SAB      TAB      Ectopic      Multiple  0   Live Births  1            Home Medications    Prior to Admission medications   Medication Sig Start Date End Date Taking? Authorizing  Provider  baclofen (LIORESAL) 10 MG tablet Take 10 mg by mouth 3 (three) times daily.   Yes [provider]  topiramate (TOPAMAX) 25 MG tablet Take 25 mg by mouth 3 (three) times daily.   Yes [provider]  methocarbamol (ROBAXIN) 500 MG tablet Take 1 tablet (500 mg total) by mouth every 8 (eight) hours as needed. 07/25/16   Hudnall, Azucena FallenShane R, MD  oxyCODONE-acetaminophen (PERCOCET/ROXICET) 5-325 MG tablet Take 1 tablet by mouth every 6 (six) hours as needed for severe pain. 07/25/16   Hudnall, Azucena FallenShane R, MD  predniSONE (DELTASONE) 10 MG tablet 6 tabs po day 1, 5 tabs po day 2, 4 tabs po day 3, 3 tabs po day 4, 2 tabs po day 5, 1 tab po day 6 07/25/16   Hudnall, Azucena FallenShane R, MD    Family History Family History  Problem Relation Age of Onset  . Hypertension Mother   . Hypertension Maternal Grandmother   . Heart attack Maternal Grandmother   . Hypertension Maternal Grandfather   .  Cancer Maternal Grandfather   . Diabetes Paternal Grandmother     Social History Social History   Tobacco Use  . Smoking status: Never Smoker  . Smokeless tobacco: Never Used  Substance Use Topics  . Alcohol use: No  . Drug use: No     Allergies   Patient has no known allergies.   Review of Systems Review of Systems  Constitutional: Negative for fever.  Eyes: Negative for visual disturbance.  Respiratory: Negative for shortness of breath.   Cardiovascular: Negative for chest pain.  Gastrointestinal: Negative for abdominal pain, blood in stool, constipation, diarrhea, nausea and vomiting.  Genitourinary: Negative for dysuria, flank pain, frequency, hematuria and urgency.       No loss of control of bowel or bladder function  Musculoskeletal: Positive for back pain. Negative for gait problem.       RLE pain, abnormal sensation  Skin: Negative for wound.  Neurological: Positive for weakness and headaches. Negative for dizziness and light-headedness.       Abnormal sensation of RLE     Physical Exam Updated Vital Signs BP 123/85 (BP Location: Right Arm)   Pulse 64   Temp 98.3 F (36.8 C) (Oral)   Resp 16   Ht 5\' 2"  (1.575 m)   Wt 68 kg (150 lb)   LMP 05/17/2017   SpO2 100%   BMI 27.44 kg/m   Physical Exam  Constitutional: She is oriented to person, place, and time. She appears well-developed and well-nourished. No distress.  HENT:  Head: Normocephalic and atraumatic.  Eyes: Conjunctivae are normal.  Neck: Neck supple.  Cardiovascular: Normal rate, regular rhythm and normal heart sounds.  Pulmonary/Chest: Effort normal and breath sounds normal. No respiratory distress. She has no wheezes.  Abdominal: Soft. Bowel sounds are normal. There is no tenderness.  Musculoskeletal: Normal range of motion.  Mild midline lumbar TTP, moderate TTP to right sciatic notch with reproduces her sxs  Neurological: She is alert and oriented to person, place, and time.  Motor:    Normal tone. 5/5 strength of BUE and BLE major muscle groups including strong and equal grip strength and dorsiflexion/plantar flexion Sensory: light touch normal in all extremities. Reported abnormal sensation along sciatic distribution on RLE. DTRs: patellar and achilles 2+ symmetric b/l, toes down going bilaterally Gait: able to bear weight on bilat legs CV: 2+ radial and DP/PT pulses  Skin: Skin is warm and dry.  Psychiatric: She has a  normal mood and affect.  Nursing note and vitals reviewed.  ED Treatments / Results  Labs (all labs ordered are listed, but only abnormal results are displayed) Labs Reviewed - No data to display  EKG None  Radiology No results found.  Procedures Procedures (including critical care time)  Medications Ordered in ED Medications  ketorolac (TORADOL) 15 MG/ML injection 15 mg (has no administration in time range)     Initial Impression / Assessment and Plan / ED Course  I have reviewed the triage vital signs and the nursing notes.  Pertinent labs & imaging results that were available during my care of the patient were reviewed by me and considered in my medical decision making (see chart for details).     Final Clinical Impressions(s) / ED Diagnoses   Final diagnoses:  Acute right-sided low back pain with right-sided sciatica   Patient with back pain and RLE pain/abnormal sensation. Has been seen by neurology for sxs recently and was started on baclofen which she has taken once this AM with improvement of sxs. She feels this is consistent with her prior diagnosis of sciatica. Discussed indications for imaging and pt in agreement to forgoe imaging of lumbar spine at this time given she has no red flag sxs. Advised her to take baclofen as directed as well as tylenol. Gave instructions for back exercises and advised her to avoid activities that exacerbate sxs. Advised her f/u with her neurologist in regards to sxs and to return if she has red  flag sxs.  No neurological deficits and normal neuro exam.  Patient can walk but states is painful.  No loss of bowel or bladder control.  No concern for cauda equina.  No fever, night sweats, weight loss, h/o cancer, IVDU.  RICE protocol and pain medicine indicated and discussed with patient. All questions answered and pt in agreement with plan.    ED Discharge Orders    None       Rayne Du 07/10/17 1044    Gwyneth Sprout, MD 07/10/17 1435

## 2017-07-10 NOTE — Discharge Instructions (Signed)
You may take 500-650 mg tylenol for you symptoms. You may also take baclofen as directed by your neurologist for your pain. Please follow up with your neurologist in regards to your symptoms today.   Return to the emergency department immediately if you experience any back pain associated with fevers, loss of control of your bowels/bladder, weakness/numbness to your legs, numbness to your groin area, inability to walk, or inability to urinate.

## 2018-01-13 IMAGING — CT CT ABD-PELV W/ CM
2 of 3 series · 16 of 46 positions shown, 18 images · IV contrast (iopamidol)
Comparison: None.

CLINICAL DATA: Diffuse abdominal pain and nausea for 4 days.

EXAM:
CT ABDOMEN AND PELVIS WITH CONTRAST
TECHNIQUE: Multidetector CT imaging of the abdomen and pelvis was performed
using the standard protocol following bolus administration of
intravenous contrast.
CONTRAST:  100mL BIA032-5OO IOPAMIDOL (BIA032-5OO) INJECTION 61%

[Series 3: abd/ pelvis 5.0 i30f 1 · axial · 0.74mm/px · z∈[+806,+1191]mm · 13 of 89 slices shown, 15 images]
[im 6/89  soft-tissue]
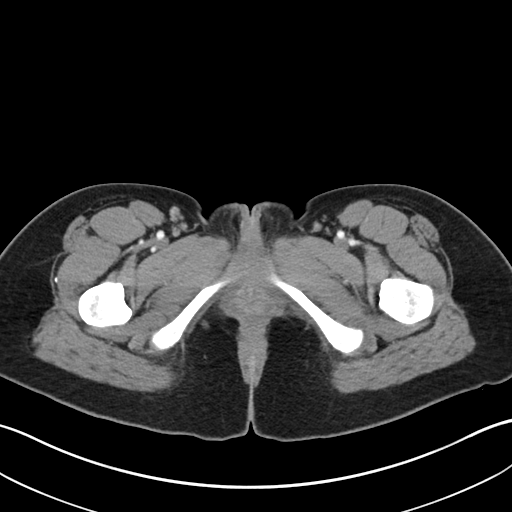
[im 6/89  bone]
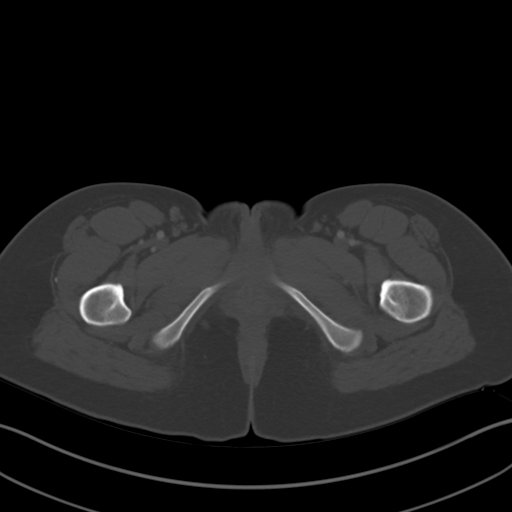
[im 12/89  soft-tissue]
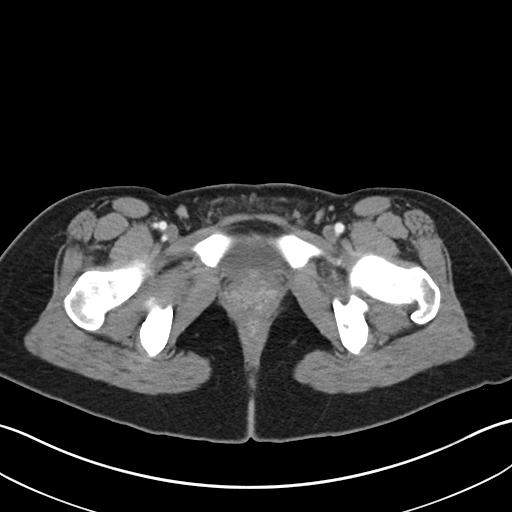
[im 18/89  soft-tissue]
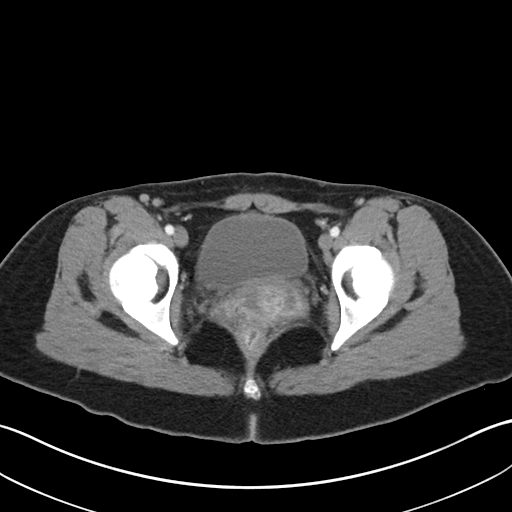
[im 26/89  soft-tissue]
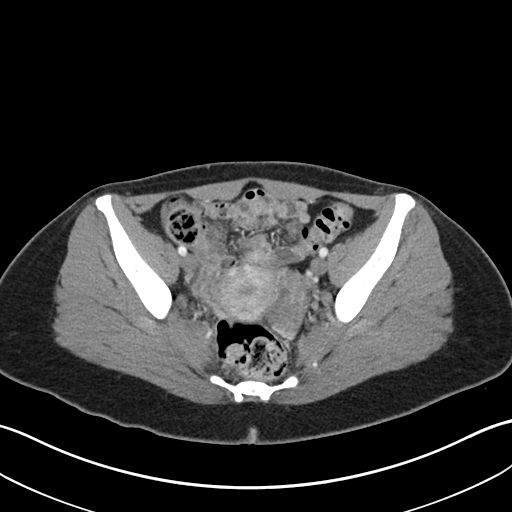
[im 32/89  soft-tissue]
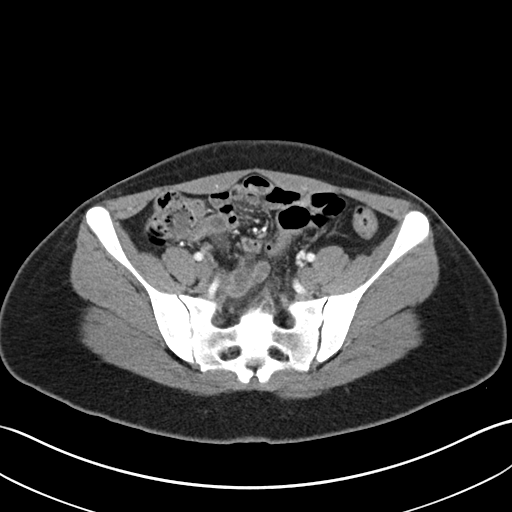
[im 37/89  soft-tissue]
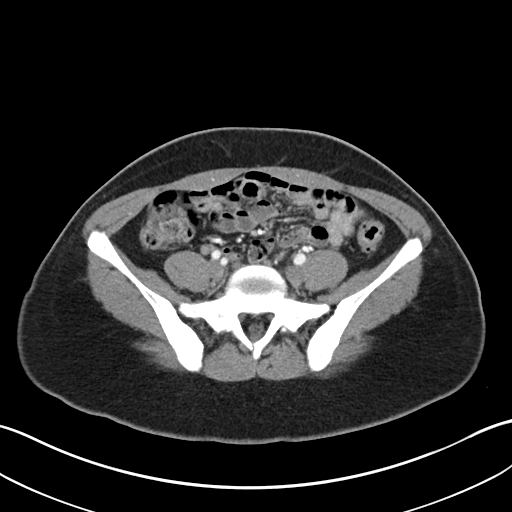
[im 46/89  soft-tissue]
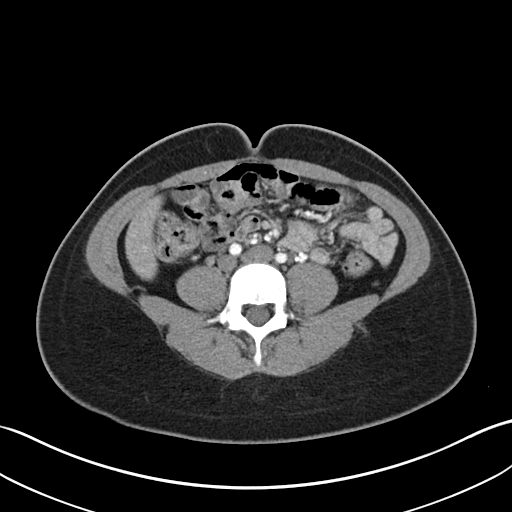
[im 52/89  soft-tissue]
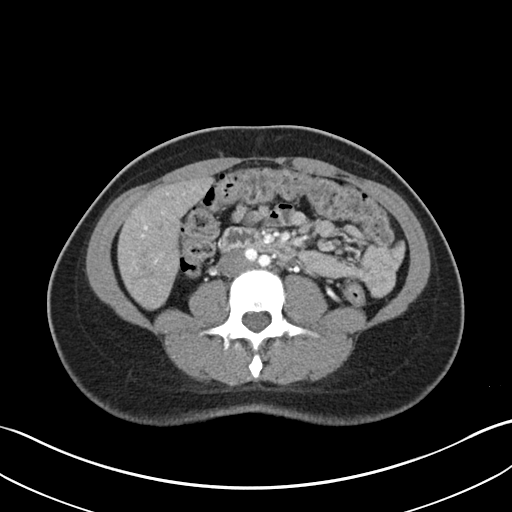
[im 57/89  soft-tissue]
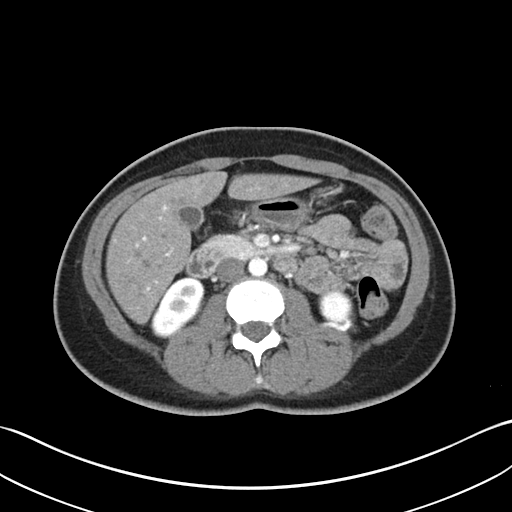
[im 57/89  bone]
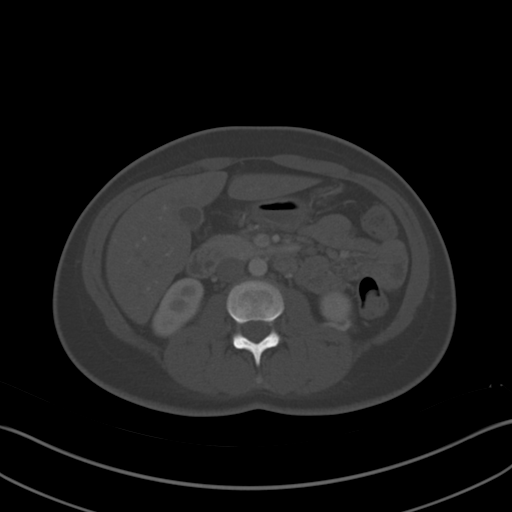
[im 63/89  soft-tissue]
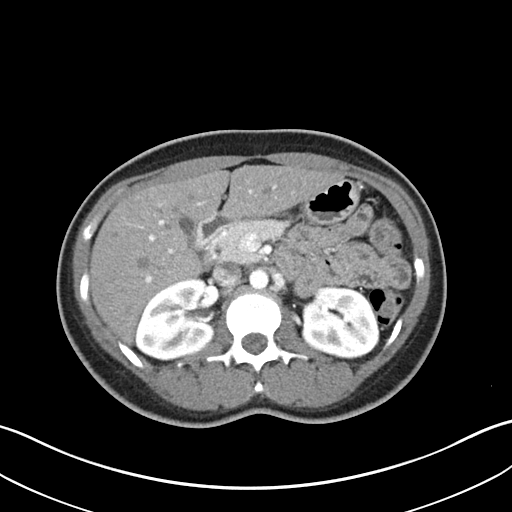
[im 71/89  soft-tissue]
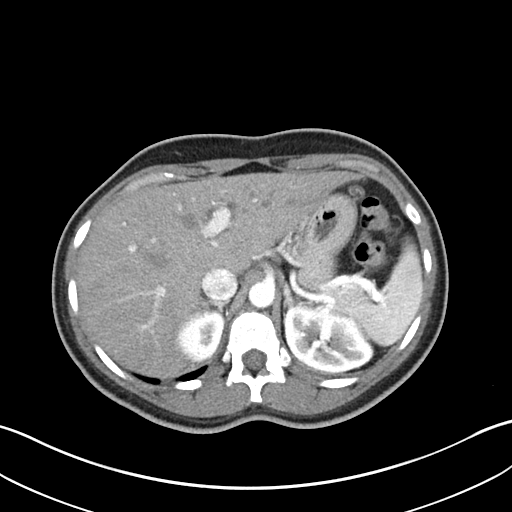
[im 77/89  soft-tissue]
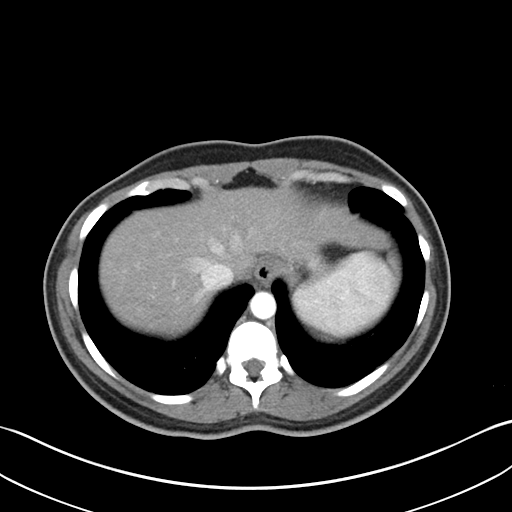
[im 83/89  soft-tissue]
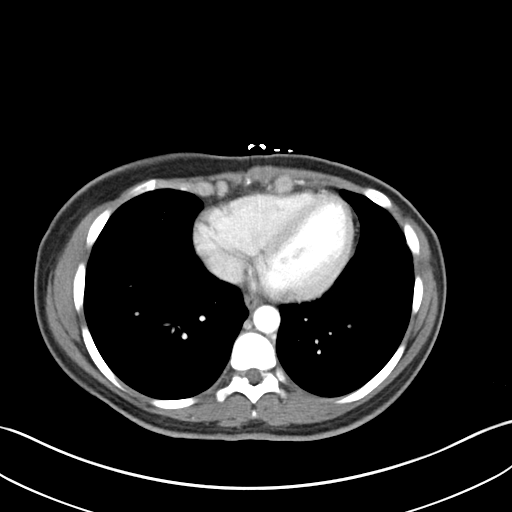

[Series 5: coronal soft tissue · coronal · 0.77mm/px · 3 of 86 slices shown]
[im 29/86  soft-tissue]
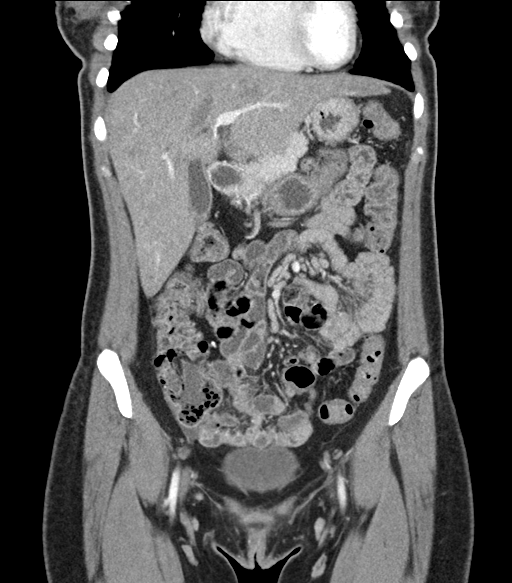
[im 38/86  soft-tissue]
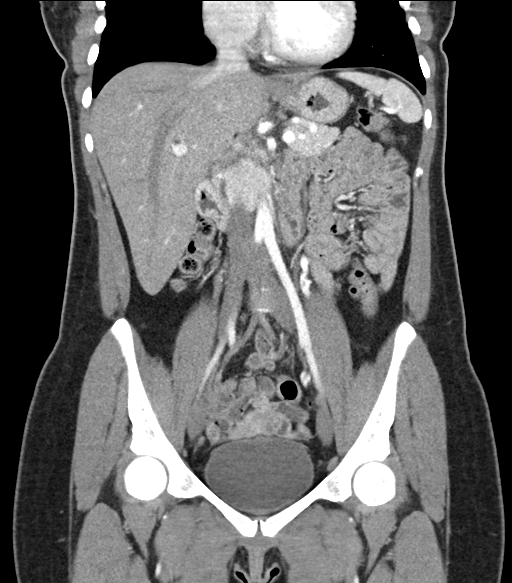
[im 48/86  soft-tissue]
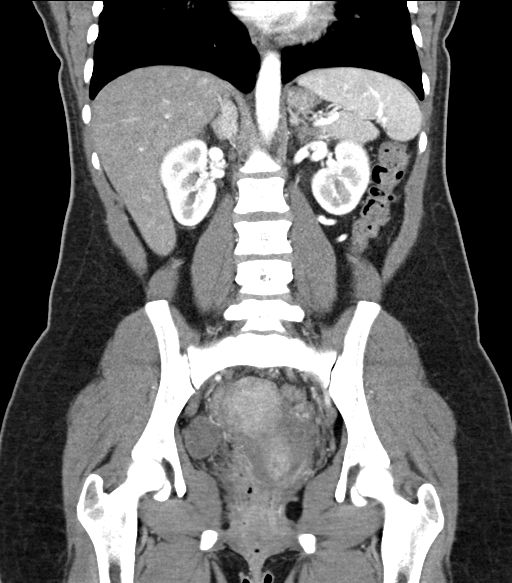

[16 of 46 positions shown; findings below may reference images not displayed]

FINDINGS: Lower chest:  Unremarkable.

Hepatobiliary: No focal abnormality within the liver parenchyma.
There is no evidence for gallstones, gallbladder wall thickening, or
pericholecystic fluid. No intrahepatic or extrahepatic biliary
dilation.

Pancreas: No focal mass lesion. No dilatation of the main duct. No
intraparenchymal cyst. No peripancreatic edema.

Spleen: No splenomegaly. No focal mass lesion.

Adrenals/Urinary Tract: No adrenal nodule or mass. Kidneys are
unremarkable. No evidence for hydroureter. The urinary bladder
appears normal for the degree of distention.

Stomach/Bowel: Stomach is nondistended. No gastric wall thickening.
No evidence of outlet obstruction. Duodenum is normally positioned
as is the ligament of Treitz. No small bowel wall thickening. No
small bowel dilatation. The terminal ileum is normal. The appendix
is normal. No gross colonic mass. No colonic wall thickening. No
substantial diverticular change.

Vascular/Lymphatic: No abdominal aortic aneurysm. No abdominal
aortic atherosclerotic calcification. There is no gastrohepatic or
hepatoduodenal ligament lymphadenopathy. No intraperitoneal or
retroperitoneal lymphadenopathy. No pelvic sidewall lymphadenopathy.

Reproductive: 4 cm right adnexal cyst noted. Uterus unremarkable.
Left ovary unremarkable.

Other: Tiny volume free fluid noted in the right pelvis.

Musculoskeletal: Bone windows reveal no worrisome lytic or sclerotic
osseous lesions.
IMPRESSION: 1. No acute findings in the abdomen or pelvis. Specifically, no
features to suggest colitis by CT.
2. 4 cm benign appearing right adnexal cyst. In a patient of this
age and for a lesion of this size, no imaging follow-up warranted by
consensus criteria. This recommendation follows ACR consensus
guidelines: White Paper of the ACR Incidental Findings Committee II
on Adnexal Findings. [HOSPITAL] [DATE].
3. Trace free fluid in the pelvis. This can be a physiologic finding
in a premenopausal female.

## 2018-04-15 ENCOUNTER — Encounter (HOSPITAL_BASED_OUTPATIENT_CLINIC_OR_DEPARTMENT_OTHER): Payer: Self-pay | Admitting: *Deleted

## 2018-04-15 ENCOUNTER — Other Ambulatory Visit: Payer: Self-pay

## 2018-04-15 ENCOUNTER — Emergency Department (HOSPITAL_BASED_OUTPATIENT_CLINIC_OR_DEPARTMENT_OTHER)
Admission: EM | Admit: 2018-04-15 | Discharge: 2018-04-15 | Disposition: A | Discharge status: Home / Self Care | Payer: BLUE CROSS/BLUE SHIELD | Attending: Emergency Medicine | Admitting: Emergency Medicine

## 2018-04-15 DIAGNOSIS — K649 Unspecified hemorrhoids: Secondary | ICD-10-CM | POA: Insufficient documentation

## 2018-04-15 DIAGNOSIS — Z79899 Other long term (current) drug therapy: Secondary | ICD-10-CM | POA: Insufficient documentation

## 2018-04-15 DIAGNOSIS — J45909 Unspecified asthma, uncomplicated: Secondary | ICD-10-CM | POA: Insufficient documentation

## 2018-04-15 DIAGNOSIS — Z87891 Personal history of nicotine dependence: Secondary | ICD-10-CM | POA: Insufficient documentation

## 2018-04-15 LAB — PREGNANCY, URINE: Preg Test, Ur: NEGATIVE

## 2018-04-15 MED ORDER — DOCUSATE SODIUM 100 MG PO CAPS: 100.0000 mg | ORAL_CAPSULE | Freq: Two times a day (BID) | ORAL | 0 refills | Status: DC

## 2018-04-15 MED ORDER — HYDROCORTISONE 2.5 % RE CREA: 1.0000 "application " | TOPICAL_CREAM | Freq: Two times a day (BID) | RECTAL | 0 refills | Status: DC

## 2018-04-15 NOTE — ED Provider Notes (Signed)
[facility] EMERGENCY DEPARTMENT Provider Note   CSN: [id number] Arrival date & time: 04/15/18  1536    History   Chief Complaint Chief Complaint  Patient presents with  . Rectal Bleeding    HPI Heather Malone is a 28 y.o. female.     Patient presents with rectal bleeding.  She states she noted a hemorrhoid 2 days ago and started bleeding and she had some moderate blood in the toilet earlier today.  She denies any dizziness.  No shortness of breath.  No chest pain.  No associate abdominal pain.  She did have an episode of constipation 3 days ago and subsequently noticed a hemorrhoid.  She has been using some witch hazel with no improvement in symptoms.     Past Medical History:  Diagnosis Date  . Anemia   . Anxiety   . Asthma    albuterol prn  . Former smoker   . Headache    migraines  . Hx of tonsillectomy    at 28 y.o.  . Hx of trichomoniasis   . Hx of varicella     Patient Active Problem List   Diagnosis Date Noted  . Neck injury, subsequent encounter 07/26/2016  . Pregnancy 09/05/2014  . Placental abruption in third trimester 09/04/2014  . Subchorionic hemorrhage in third trimester 07/26/2014  . [redacted] weeks gestation of pregnancy   . Preterm contractions 07/24/2014  . [redacted] weeks gestation of pregnancy   . Traumatic injury during pregnancy in third trimester   . Velamentous insertion of umbilical cord   . [redacted] weeks gestation of pregnancy   . Encounter for fetal anatomic survey     Past Surgical History:  Procedure Laterality Date  . TONSILLECTOMY       OB History    Gravida  1   Para  1   Term  1   Preterm      AB      Living  1     SAB      TAB      Ectopic      Multiple  0   Live Births  1            Home Medications    Prior to Admission medications   Medication Sig Start Date End Date Taking? Authorizing Provider  baclofen (LIORESAL) 10 MG tablet Take 10 mg by mouth 3 (three) times daily.   Yes [provider]  docusate sodium (COLACE) 100 MG capsule Take 1 capsule (100 mg total) by mouth every 12 (twelve) hours. 04/15/18   Rolan Bucco, MD  hydrocortisone (ANUSOL-HC) 2.5 % rectal cream Place 1 application rectally 2 (two) times daily. 04/15/18   Rolan Bucco, MD  methocarbamol (ROBAXIN) 500 MG tablet Take 1 tablet (500 mg total) by mouth every 8 (eight) hours as needed. 07/25/16   Hudnall, Azucena Fallen, MD  oxyCODONE-acetaminophen (PERCOCET/ROXICET) 5-325 MG tablet Take 1 tablet by mouth every 6 (six) hours as needed for severe pain. 07/25/16   Hudnall, Azucena Fallen, MD  predniSONE (DELTASONE) 10 MG tablet 6 tabs po day 1, 5 tabs po day 2, 4 tabs po day 3, 3 tabs po day 4, 2 tabs po day 5, 1 tab po day 6 07/25/16   Hudnall, Azucena Fallen, MD  topiramate (TOPAMAX) 25 MG tablet Take 25 mg by mouth 3 (three) times daily.    [provider]    Family History Family History  Problem Relation Age of Onset  .  Hypertension Mother   . Hypertension Maternal Grandmother   . Heart attack Maternal Grandmother   . Hypertension Maternal Grandfather   . Cancer Maternal Grandfather   . Diabetes Paternal Grandmother     Social History Social History   Tobacco Use  . Smoking status: Never Smoker  . Smokeless tobacco: Never Used  Substance Use Topics  . Alcohol use: No  . Drug use: No     Allergies   Patient has no known allergies.   Review of Systems Review of Systems  Constitutional: Negative for chills, diaphoresis, fatigue and fever.  HENT: Negative for congestion, rhinorrhea and sneezing.   Eyes: Negative.   Respiratory: Negative for cough, chest tightness and shortness of breath.   Cardiovascular: Negative for chest pain and leg swelling.  Gastrointestinal: Positive for rectal pain. Negative for abdominal pain, blood in stool, diarrhea, nausea and vomiting.       Blood from bottom  Genitourinary: Negative for difficulty urinating, flank pain, frequency and hematuria.   Musculoskeletal: Negative for arthralgias and back pain.  Skin: Negative for rash.  Neurological: Negative for dizziness, speech difficulty, weakness, numbness and headaches.     Physical Exam Updated Vital Signs BP 118/83   Pulse 97   Temp 98.7 F (37.1 C) (Oral)   Resp 14   Ht  (1.575 m)   Wt 61 kg   LMP 02/27/2018   SpO2 97%   BMI 24.60 kg/m   Physical Exam Constitutional:      Appearance: She is well-developed.  HENT:     Head: Normocephalic and atraumatic.  Eyes:     Pupils: Pupils are equal, round, and reactive to light.  Neck:     Musculoskeletal: Normal range of motion and neck supple.  Cardiovascular:     Rate and Rhythm: Normal rate and regular rhythm.     Heart sounds: Normal heart sounds.  Pulmonary:     Effort: Pulmonary effort is normal. No respiratory distress.     Breath sounds: Normal breath sounds. No wheezing or rales.  Chest:     Chest wall: No tenderness.  Abdominal:     General: Bowel sounds are normal.     Palpations: Abdomen is soft.     Tenderness: There is no abdominal tenderness. There is no guarding or rebound.  Genitourinary:    Comments: Patient has some moderate sized soft external hemorrhoid.  There is a small scab where it apparently has been bleeding but there is no active bleeding. Musculoskeletal: Normal range of motion.  Lymphadenopathy:     Cervical: No cervical adenopathy.  Skin:    General: Skin is warm and dry.     Findings: No rash.  Neurological:     Mental Status: She is alert and oriented to person, place, and time.      ED Treatments / Results  Labs (all labs ordered are listed, but only abnormal results are displayed) Labs Reviewed  PREGNANCY, URINE    EKG None  Radiology No results found.  Procedures Procedures (including critical care time)  Medications Ordered in ED Medications - No data to display   Initial Impression / Assessment and Plan / ED Course  I have reviewed the triage vital  signs and the nursing notes.  Pertinent labs & imaging results that were available during my care of the patient were reviewed by me and considered in my medical decision making (see chart for details).        Patient's bleeding appears to be  related to an external hemorrhoid.  There is no ongoing bleeding.  She is asymptomatic.  She was given a prescription for Colace and Anusol cream.  She was given a referral to follow-up with general surgery as needed.  Return precautions were needed.  Final Clinical Impressions(s) / ED Diagnoses   Final diagnoses:  Hemorrhoids, unspecified hemorrhoid type    ED Discharge Orders         Ordered    hydrocortisone (ANUSOL-HC) 2.5 % rectal cream  2 times daily     04/15/18 1626    docusate sodium (COLACE) 100 MG capsule  Every 12 hours     04/15/18 1626           Rolan Bucco, MD 04/15/18 1630

## 2018-04-15 NOTE — ED Triage Notes (Signed)
Hemorrhoids

## 2018-04-15 NOTE — ED Notes (Signed)
Pt verbalized understanding of dc instructions.

## 2018-07-01 ENCOUNTER — Emergency Department (HOSPITAL_BASED_OUTPATIENT_CLINIC_OR_DEPARTMENT_OTHER): Payer: Self-pay

## 2018-07-01 ENCOUNTER — Encounter (HOSPITAL_BASED_OUTPATIENT_CLINIC_OR_DEPARTMENT_OTHER): Payer: Self-pay | Admitting: Emergency Medicine

## 2018-07-01 ENCOUNTER — Emergency Department (HOSPITAL_BASED_OUTPATIENT_CLINIC_OR_DEPARTMENT_OTHER)
Admission: EM | Admit: 2018-07-01 | Discharge: 2018-07-01 | Disposition: A | Discharge status: Home / Self Care | Payer: Self-pay | Attending: Emergency Medicine | Admitting: Emergency Medicine

## 2018-07-01 ENCOUNTER — Other Ambulatory Visit: Payer: Self-pay

## 2018-07-01 DIAGNOSIS — F17290 Nicotine dependence, other tobacco product, uncomplicated: Secondary | ICD-10-CM | POA: Insufficient documentation

## 2018-07-01 DIAGNOSIS — Z79899 Other long term (current) drug therapy: Secondary | ICD-10-CM | POA: Insufficient documentation

## 2018-07-01 DIAGNOSIS — R072 Precordial pain: Secondary | ICD-10-CM | POA: Insufficient documentation

## 2018-07-01 DIAGNOSIS — R109 Unspecified abdominal pain: Secondary | ICD-10-CM | POA: Insufficient documentation

## 2018-07-01 DIAGNOSIS — J45909 Unspecified asthma, uncomplicated: Secondary | ICD-10-CM | POA: Insufficient documentation

## 2018-07-01 LAB — URINALYSIS, ROUTINE W REFLEX MICROSCOPIC
Bilirubin Urine: NEGATIVE
Glucose, UA: NEGATIVE mg/dL
Hgb urine dipstick: NEGATIVE
Ketones, ur: NEGATIVE mg/dL
Leukocytes,Ua: NEGATIVE
Nitrite: NEGATIVE
Protein, ur: NEGATIVE mg/dL
Specific Gravity, Urine: 1.02 (ref 1.005–1.030)
pH: 7 (ref 5.0–8.0)

## 2018-07-01 LAB — COMPREHENSIVE METABOLIC PANEL
ALT: 13 U/L (ref 0–44)
AST: 13 U/L — ABNORMAL LOW (ref 15–41)
Albumin: 3.9 g/dL (ref 3.5–5.0)
Alkaline Phosphatase: 37 U/L — ABNORMAL LOW (ref 38–126)
Anion gap: 6 (ref 5–15)
BUN: 12 mg/dL (ref 6–20)
CO2: 23 mmol/L (ref 22–32)
Calcium: 8.8 mg/dL — ABNORMAL LOW (ref 8.9–10.3)
Chloride: 107 mmol/L (ref 98–111)
Creatinine, Ser: 0.6 mg/dL (ref 0.44–1.00)
GFR calc Af Amer: >60 mL/min (ref >60)
GFR calc non Af Amer: >60 mL/min (ref >60)
Glucose, Bld: 91 mg/dL (ref 70–99)
Potassium: 4.2 mmol/L (ref 3.5–5.1)
Sodium: 136 mmol/L (ref 135–145)
Total Bilirubin: 0.4 mg/dL (ref 0.3–1.2)
Total Protein: 7.6 g/dL (ref 6.5–8.1)

## 2018-07-01 LAB — PREGNANCY, URINE: Preg Test, Ur: NEGATIVE

## 2018-07-01 LAB — CBC WITH DIFFERENTIAL/PLATELET
Abs Immature Granulocytes: 0.01 10*3/uL (ref 0.00–0.07)
Basophils Absolute: 0 10*3/uL (ref 0.0–0.1)
Basophils Relative: 1 %
Eosinophils Absolute: 0.2 10*3/uL (ref 0.0–0.5)
Eosinophils Relative: 4 %
HCT: 35.7 % — ABNORMAL LOW (ref 36.0–46.0)
Hemoglobin: 11.5 g/dL — ABNORMAL LOW (ref 12.0–15.0)
Immature Granulocytes: 0 %
Lymphocytes Relative: 36 %
Lymphs Abs: 1.6 10*3/uL (ref 0.7–4.0)
MCH: 27.5 pg (ref 26.0–34.0)
MCHC: 32.2 g/dL (ref 30.0–36.0)
MCV: 85.4 fL (ref 80.0–100.0)
Monocytes Absolute: 0.3 10*3/uL (ref 0.1–1.0)
Monocytes Relative: 7 %
Neutro Abs: 2.3 10*3/uL (ref 1.7–7.7)
Neutrophils Relative %: 52 %
Platelets: 316 10*3/uL (ref 150–400)
RBC: 4.18 MIL/uL (ref 3.87–5.11)
RDW: 15 % (ref 11.5–15.5)
WBC: 4.4 10*3/uL (ref 4.0–10.5)
nRBC: 0 % (ref 0.0–0.2)

## 2018-07-01 LAB — WET PREP, GENITAL
Clue Cells Wet Prep HPF POC: NONE SEEN
Sperm: NONE SEEN
Trich, Wet Prep: NONE SEEN
Yeast Wet Prep HPF POC: NONE SEEN

## 2018-07-01 LAB — LIPASE, BLOOD: Lipase: 41 U/L (ref 11–51)

## 2018-07-01 MED ORDER — DOXYCYCLINE HYCLATE 100 MG PO CAPS: 100.0000 mg | ORAL_CAPSULE | Freq: Two times a day (BID) | ORAL | 0 refills | Status: DC

## 2018-07-01 MED ORDER — IOHEXOL 300 MG/ML  SOLN
100.0000 mL | Freq: Once | INTRAMUSCULAR | Status: AC | PRN
  Administered 2018-07-01: 100 mL via INTRAVENOUS

## 2018-07-01 MED ORDER — LIDOCAINE HCL (PF) 1 % IJ SOLN
INTRAMUSCULAR | Status: AC
  Administered 2018-07-01: 2.3 mL
  Filled 2018-07-01: qty 5

## 2018-07-01 MED ORDER — HYDROMORPHONE HCL 1 MG/ML IJ SOLN
1.0000 mg | Freq: Once | INTRAMUSCULAR | Status: AC
  Administered 2018-07-01: 1 mg via INTRAVENOUS
  Filled 2018-07-01: qty 1

## 2018-07-01 MED ORDER — CEFTRIAXONE SODIUM 250 MG IJ SOLR
250.0000 mg | Freq: Once | INTRAMUSCULAR | Status: AC
  Administered 2018-07-01: 250 mg via INTRAMUSCULAR
  Filled 2018-07-01: qty 250

## 2018-07-01 MED ORDER — MORPHINE SULFATE (PF) 4 MG/ML IV SOLN
4.0000 mg | Freq: Once | INTRAVENOUS | Status: AC
  Administered 2018-07-01: 4 mg via INTRAVENOUS
  Filled 2018-07-01: qty 1

## 2018-07-01 NOTE — ED Triage Notes (Signed)
Right rib and central chest pain which increases with inspiration.  Some sob.  Some diaphoresis.  No fever, no cough.  Started during the night.  Denies travel or leg pain.

## 2018-07-01 NOTE — ED Provider Notes (Signed)
[facility] EMERGENCY DEPARTMENT Provider Note   CSN: [id number] Arrival date & time: 07/01/18  7829    History   Chief Complaint Chief Complaint  Patient presents with  . Chest Pain    HPI Heather Malone is a 28 y.o. female.     HPI Patient is a 28 year old female presents the emergency department complaints of right-sided chest discomfort without significant shortness of breath.  She also reports radiation of her discomfort in her right chest down into her right abdomen with discomfort noted in her right abdomen over the past 48 hours.  No dysuria or urinary frequency.  No flank pain.  No significant vaginal complaints.  Denies nausea vomiting diarrhea.  No history of DVT or pulmonary embolism.  Some discomfort with breathing on the right side of her chest.  No fevers.  No cough.   Past Medical History:  Diagnosis Date  . Anemia   . Anxiety   . Asthma    albuterol prn  . Headache    migraines  . Hx of trichomoniasis   . Hx of varicella     Patient Active Problem List   Diagnosis Date Noted  . Neck injury, subsequent encounter 07/26/2016  . Pregnancy 09/05/2014  . Placental abruption in third trimester 09/04/2014  . Subchorionic hemorrhage in third trimester 07/26/2014  . [redacted] weeks gestation of pregnancy   . Preterm contractions 07/24/2014  . [redacted] weeks gestation of pregnancy   . Traumatic injury during pregnancy in third trimester   . Velamentous insertion of umbilical cord   . [redacted] weeks gestation of pregnancy   . Encounter for fetal anatomic survey     Past Surgical History:  Procedure Laterality Date  . TONSILLECTOMY       OB History    Gravida  1   Para  1   Term  1   Preterm      AB      Living  1     SAB      TAB      Ectopic      Multiple  0   Live Births  1            Home Medications    Prior to Admission medications   Medication Sig Start Date End Date Taking? Authorizing Provider  baclofen (LIORESAL) 10 MG  tablet Take 10 mg by mouth 3 (three) times daily.    [provider]  docusate sodium (COLACE) 100 MG capsule Take 1 capsule (100 mg total) by mouth every 12 (twelve) hours. 04/15/18   Rolan Bucco, MD  hydrocortisone (ANUSOL-HC) 2.5 % rectal cream Place 1 application rectally 2 (two) times daily. 04/15/18   Rolan Bucco, MD  methocarbamol (ROBAXIN) 500 MG tablet Take 1 tablet (500 mg total) by mouth every 8 (eight) hours as needed. 07/25/16   Hudnall, Azucena Fallen, MD  oxyCODONE-acetaminophen (PERCOCET/ROXICET) 5-325 MG tablet Take 1 tablet by mouth every 6 (six) hours as needed for severe pain. 07/25/16   Hudnall, Azucena Fallen, MD  predniSONE (DELTASONE) 10 MG tablet 6 tabs po day 1, 5 tabs po day 2, 4 tabs po day 3, 3 tabs po day 4, 2 tabs po day 5, 1 tab po day 6 07/25/16   Hudnall, Azucena Fallen, MD  topiramate (TOPAMAX) 25 MG tablet Take 25 mg by mouth 3 (three) times daily.    [provider]    Family History Family History  Problem Relation Age of Onset  .  Hypertension Mother   . Hypertension Maternal Grandmother   . Heart attack Maternal Grandmother   . Hypertension Maternal Grandfather   . Cancer Maternal Grandfather   . Diabetes Paternal Grandmother     Social History Social History   Tobacco Use  . Smoking status: Current Some Day Smoker    Types: Cigars  . Smokeless tobacco: Never Used  Substance Use Topics  . Alcohol use: No  . Drug use: No     Allergies   Patient has no known allergies.   Review of Systems Review of Systems  All other systems reviewed and are negative.    Physical Exam Updated Vital Signs BP 105/78   Pulse (!) 51   Temp 97.8 F (36.6 C) (Oral)   Resp 14   Ht 5\' 2"  (1.575 m)   Wt 63.5 kg   LMP 06/20/2018   SpO2 98%   BMI 25.61 kg/m   Physical Exam Vitals signs and nursing note reviewed.  Constitutional:      General: She is not in acute distress.    Appearance: She is well-developed.  HENT:     Head: Normocephalic and  atraumatic.  Neck:     Musculoskeletal: Normal range of motion.  Cardiovascular:     Rate and Rhythm: Normal rate and regular rhythm.     Heart sounds: Normal heart sounds.  Pulmonary:     Effort: Pulmonary effort is normal.     Breath sounds: Normal breath sounds.  Abdominal:     General: There is no distension.     Palpations: Abdomen is soft.     Comments: Right-sided abdominal tenderness  Musculoskeletal: Normal range of motion.  Skin:    General: Skin is warm and dry.  Neurological:     Mental Status: She is alert and oriented to person, place, and time.  Psychiatric:        Judgment: Judgment normal.      ED Treatments / Results  Labs (all labs ordered are listed, but only abnormal results are displayed) Labs Reviewed  WET PREP, GENITAL - Abnormal; Notable for the following components:      Result Value   WBC, Wet Prep HPF POC MANY (*)    All other components within normal limits  CBC WITH DIFFERENTIAL/PLATELET - Abnormal; Notable for the following components:   Hemoglobin 11.5 (*)    HCT 35.7 (*)    All other components within normal limits  COMPREHENSIVE METABOLIC PANEL - Abnormal; Notable for the following components:   Calcium 8.8 (*)    AST 13 (*)    Alkaline Phosphatase 37 (*)    All other components within normal limits  LIPASE, BLOOD  URINALYSIS, ROUTINE W REFLEX MICROSCOPIC  PREGNANCY, URINE  GC/CHLAMYDIA PROBE AMP ([place]) NOT AT [facility]    EKG EKG Interpretation  Date/Time:  Thursday July 01 2018 12:06:32 EDT Ventricular Rate:  53 PR Interval:    QRS Duration: 93 QT Interval:  410 QTC Calculation: 385 R Axis:   77 Text Interpretation:  Sinus rhythm Prolonged PR interval No significant change was found Confirmed by Azalia Bilis ([id number]) on 07/01/2018 12:53:07 PM   Radiology Dg Chest 2 View  Result Date: 07/01/2018 CLINICAL DATA:  28 year old female with history of chest pain. Burning pain radiating into the lower right ribs. EXAM: CHEST -  2 VIEW COMPARISON:  Chest x-ray 05/30/2014. FINDINGS: Lung volumes are normal. No consolidative airspace disease. No pleural effusions. No pneumothorax. No pulmonary nodule or mass noted. Pulmonary  vasculature and the cardiomediastinal silhouette are within normal limits. IMPRESSION: No radiographic evidence of acute cardiopulmonary disease. Electronically Signed   By: Trudie Reedaniel  Entrikin M.D.   On: 07/01/2018 10:27   Ct Abdomen Pelvis W Contrast  Result Date: 07/01/2018 CLINICAL DATA:  28 year old female with history of chest pain radiating into the right lower quadrant since yesterday evening. EXAM: CT ABDOMEN AND PELVIS WITH CONTRAST TECHNIQUE: Multidetector CT imaging of the abdomen and pelvis was performed using the standard protocol following bolus administration of intravenous contrast. CONTRAST:  100mL OMNIPAQUE IOHEXOL 300 MG/ML  SOLN COMPARISON:  No priors. FINDINGS: Lower chest: Trace amount of pleural fluid and/or thickening lying dependently in the thorax bilaterally. Hepatobiliary: No suspicious cystic or solid hepatic lesions. No intra or extrahepatic biliary ductal dilatation. Gallbladder is normal in appearance. Pancreas: No pancreatic mass. No pancreatic ductal dilatation. No pancreatic or peripancreatic fluid or inflammatory changes. Spleen: Unremarkable. Adrenals/Urinary Tract: Bilateral kidneys and adrenal glands are normal in appearance. No hydroureteronephrosis. Urinary bladder is normal in appearance. Stomach/Bowel: Normal appearance of the stomach. No pathologic dilatation of small bowel or colon. Normal appendix. Vascular/Lymphatic: No significant atherosclerotic disease, aneurysm or dissection noted in the abdominal or pelvic vasculature. No lymphadenopathy noted in the abdomen or pelvis. Reproductive: Uterus is normal in appearance. Multiple small follicles in the ovaries bilaterally, largest of which measures 2.6 cm on the right. Other: Trace amount of fluid in the low anatomic pelvis.  This is associated with some stranding in the small bowel mesentery and mesocolon in the adjacent portions of the low anatomic pelvis, indicative of regional inflammation. No pneumoperitoneum. Musculoskeletal: There are no aggressive appearing lytic or blastic lesions noted in the visualized portions of the skeleton. IMPRESSION: 1. There is a spectrum of findings in the pelvis concerning for potential pelvic inflammatory disease. Further clinical evaluation is recommended. 2. No acute findings are noted in the abdomen. 3. Normal appendix. Electronically Signed   By: Trudie Reedaniel  Entrikin M.D.   On: 07/01/2018 14:08    Procedures Procedures (including critical care time)  Medications Ordered in ED Medications  morphine 4 MG/ML injection 4 mg (4 mg Intravenous Given 07/01/18 1047)  HYDROmorphone (DILAUDID) injection 1 mg (1 mg Intravenous Given 07/01/18 1312)  iohexol (OMNIPAQUE) 300 MG/ML solution 100 mL (100 mLs Intravenous Contrast Given 07/01/18 1337)     Initial Impression / Assessment and Plan / ED Course  I have reviewed the triage vital signs and the nursing notes.  Pertinent labs & imaging results that were available during my care of the patient were reviewed by me and considered in my medical decision making (see chart for details).        Right-sided abdominal discomfort.  CT scan without obvious acute pathology but some findings concerning for a possible PID.  Patient without significant cervical motion tenderness nor does she have significant vaginal discharge but given no alternative explanation for her abdominal pain and CT findings as well as white blood cells on pelvic exam she will be initiated on Rocephin and doxycycline for possible PID.  Follow-up with her gynecologist.  No indication for additional work-up or treatment at this time.  Improved symptoms in the ER.  Final Clinical Impressions(s) / ED Diagnoses   Final diagnoses:  None    ED Discharge Orders    None        Azalia Bilisampos, Vahan Wadsworth, MD 07/02/18 1158

## 2018-07-01 NOTE — Discharge Instructions (Addendum)
Take ibuprofen and Tylenol for pain  Fill the prescription from antibiotics at your pharmacy  Please call the gynecology office for follow-up  Please return to the emergency department for any new or worsening symptoms

## 2018-07-02 LAB — GC/CHLAMYDIA PROBE AMP (~~LOC~~) NOT AT ARMC
Chlamydia: POSITIVE — AB
Neisseria Gonorrhea: POSITIVE — AB

## 2019-05-16 ENCOUNTER — Emergency Department (HOSPITAL_BASED_OUTPATIENT_CLINIC_OR_DEPARTMENT_OTHER)
Admission: EM | Admit: 2019-05-16 | Discharge: 2019-05-16 | Disposition: A | Discharge status: Home / Self Care | Payer: Self-pay | Attending: Emergency Medicine | Admitting: Emergency Medicine

## 2019-05-16 ENCOUNTER — Emergency Department (HOSPITAL_BASED_OUTPATIENT_CLINIC_OR_DEPARTMENT_OTHER): Payer: Self-pay

## 2019-05-16 ENCOUNTER — Encounter (HOSPITAL_BASED_OUTPATIENT_CLINIC_OR_DEPARTMENT_OTHER): Payer: Self-pay | Admitting: Emergency Medicine

## 2019-05-16 ENCOUNTER — Other Ambulatory Visit: Payer: Self-pay

## 2019-05-16 DIAGNOSIS — R519 Headache, unspecified: Secondary | ICD-10-CM | POA: Insufficient documentation

## 2019-05-16 DIAGNOSIS — Y9289 Other specified places as the place of occurrence of the external cause: Secondary | ICD-10-CM | POA: Insufficient documentation

## 2019-05-16 DIAGNOSIS — Z79899 Other long term (current) drug therapy: Secondary | ICD-10-CM | POA: Insufficient documentation

## 2019-05-16 DIAGNOSIS — W500XXA Accidental hit or strike by another person, initial encounter: Secondary | ICD-10-CM | POA: Insufficient documentation

## 2019-05-16 DIAGNOSIS — F17290 Nicotine dependence, other tobacco product, uncomplicated: Secondary | ICD-10-CM | POA: Insufficient documentation

## 2019-05-16 DIAGNOSIS — J45909 Unspecified asthma, uncomplicated: Secondary | ICD-10-CM | POA: Insufficient documentation

## 2019-05-16 DIAGNOSIS — Y9389 Activity, other specified: Secondary | ICD-10-CM | POA: Insufficient documentation

## 2019-05-16 DIAGNOSIS — S0990XA Unspecified injury of head, initial encounter: Secondary | ICD-10-CM | POA: Insufficient documentation

## 2019-05-16 DIAGNOSIS — Y999 Unspecified external cause status: Secondary | ICD-10-CM | POA: Insufficient documentation

## 2019-05-16 DIAGNOSIS — H538 Other visual disturbances: Secondary | ICD-10-CM | POA: Insufficient documentation
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
## 2020-01-28 NOTE — L&D Delivery Note (Signed)
OB/GYN Faculty Practice Delivery Note  Heather Malone is a 30 y.o. [id number] s/p SVD at [redacted]w[redacted]d. She was admitted for IOL due to pre-eclampsia with severe features.   ROM: 0h 81m with clear fluid GBS Status: Unknown; PCN for prematurity   Delivery Date/Time: 09/12/20 at 2228  Delivery: Called to room and patient was 8 cm dilated with a bulging bag. AROM performed with clear fluid. With her next contraction, infant was crowning. Head delivered ROA. No nuchal cord present. Shoulder and body delivered in usual fashion. Infant with spontaneous cry, placed on mother's abdomen, dried and stimulated. Cord clamped x 2 after 1-minute delay, and cut by family member under my direct supervision. Cord blood drawn. Placenta delivered spontaneously with gentle cord traction. Fundus firm with massage and Pitocin. Labia, perineum, vagina, and cervix were inspected with no lacerations noted.  Placenta: Intact; 3 vessel cord Complications: None Lacerations: None EBL: 100 cc Analgesia: None  Infant: Viable female  APGARs 5 and 9  1900 g  Evalina Field, MD  OB/GYN Fellow, Faculty Practice

## 2020-02-19 ENCOUNTER — Inpatient Hospital Stay (HOSPITAL_COMMUNITY)
Admission: AD | Admit: 2020-02-19 | Discharge: 2020-02-19 | Disposition: A | Discharge status: Home / Self Care | Payer: Medicaid Other | Attending: Family Medicine | Admitting: Family Medicine

## 2020-02-19 ENCOUNTER — Inpatient Hospital Stay (HOSPITAL_COMMUNITY): Payer: Medicaid Other

## 2020-02-19 ENCOUNTER — Encounter (HOSPITAL_COMMUNITY): Payer: Self-pay

## 2020-02-19 ENCOUNTER — Other Ambulatory Visit: Payer: Self-pay | Admitting: Student

## 2020-02-19 ENCOUNTER — Other Ambulatory Visit: Payer: Self-pay

## 2020-02-19 DIAGNOSIS — R109 Unspecified abdominal pain: Secondary | ICD-10-CM

## 2020-02-19 DIAGNOSIS — Z7952 Long term (current) use of systemic steroids: Secondary | ICD-10-CM | POA: Insufficient documentation

## 2020-02-19 DIAGNOSIS — R102 Pelvic and perineal pain: Secondary | ICD-10-CM | POA: Insufficient documentation

## 2020-02-19 DIAGNOSIS — O99331 Smoking (tobacco) complicating pregnancy, first trimester: Secondary | ICD-10-CM | POA: Insufficient documentation

## 2020-02-19 DIAGNOSIS — Z3A01 Less than 8 weeks gestation of pregnancy: Secondary | ICD-10-CM | POA: Insufficient documentation

## 2020-02-19 DIAGNOSIS — F17290 Nicotine dependence, other tobacco product, uncomplicated: Secondary | ICD-10-CM | POA: Insufficient documentation

## 2020-02-19 DIAGNOSIS — O3680X0 Pregnancy with inconclusive fetal viability, not applicable or unspecified: Secondary | ICD-10-CM

## 2020-02-19 DIAGNOSIS — O26891 Other specified pregnancy related conditions, first trimester: Secondary | ICD-10-CM

## 2020-02-19 LAB — CBC
HCT: 36.9 % (ref 36.0–46.0)
Hemoglobin: 12 g/dL (ref 12.0–15.0)
MCH: 28.1 pg (ref 26.0–34.0)
MCHC: 32.5 g/dL (ref 30.0–36.0)
MCV: 86.4 fL (ref 80.0–100.0)
Platelets: 293 10*3/uL (ref 150–400)
RBC: 4.27 MIL/uL (ref 3.87–5.11)
RDW: 12.9 % (ref 11.5–15.5)
WBC: 5.5 10*3/uL (ref 4.0–10.5)
nRBC: 0 % (ref 0.0–0.2)

## 2020-02-19 LAB — COMPREHENSIVE METABOLIC PANEL
ALT: 16 U/L (ref 0–44)
AST: 16 U/L (ref 15–41)
Albumin: 4 g/dL (ref 3.5–5.0)
Alkaline Phosphatase: 30 U/L — ABNORMAL LOW (ref 38–126)
Anion gap: 10 (ref 5–15)
BUN: 5 mg/dL — ABNORMAL LOW (ref 6–20)
CO2: 23 mmol/L (ref 22–32)
Calcium: 9.2 mg/dL (ref 8.9–10.3)
Chloride: 103 mmol/L (ref 98–111)
Creatinine, Ser: 0.53 mg/dL (ref 0.44–1.00)
GFR, Estimated: >60 mL/min (ref >60)
Glucose, Bld: 92 mg/dL (ref 70–99)
Potassium: 4.4 mmol/L (ref 3.5–5.1)
Sodium: 136 mmol/L (ref 135–145)
Total Bilirubin: 0.4 mg/dL (ref 0.3–1.2)
Total Protein: 7.1 g/dL (ref 6.5–8.1)

## 2020-02-19 LAB — WET PREP, GENITAL
Clue Cells Wet Prep HPF POC: NONE SEEN
Sperm: NONE SEEN
Yeast Wet Prep HPF POC: NONE SEEN

## 2020-02-19 LAB — HCG, QUANTITATIVE, PREGNANCY: hCG, Beta Chain, Quant, S: 1126 m[IU]/mL — ABNORMAL HIGH (ref <5)

## 2020-02-19 LAB — POCT PREGNANCY, URINE: Preg Test, Ur: POSITIVE — AB

## 2020-02-19 MED ORDER — METRONIDAZOLE 500 MG PO TABS
2000.0000 mg | ORAL_TABLET | Freq: Once | ORAL | Status: AC
  Administered 2020-02-19: 2000 mg via ORAL
  Filled 2020-02-19: qty 4

## 2020-02-19 NOTE — MAU Note (Signed)
Pt reports to mau with c/o lower abd cramping since yesterday.  Pt denies vag bleeding but reports some yellow dc.

## 2020-02-19 NOTE — Discharge Instructions (Signed)
What is trichomoniasis? Trichomoniasis is a sexually transmitted infection (STI) caused by a parasite. Trichomoniasis can affect both  males and females. How do you get trichomoniasis? Trichomoniasis is spread through vaginal sex with an infected person. Can you protect yourself from trichomoniasis? Yes. The best ways to avoid trichomoniasis are: . Not have sex (abstinence). . Limit your number of sex partners. . Use a latex condom correctly each time you have sex.  . Talk to your partner(s) about trichomoniasis and other STIs. What are the symptoms of trichomoniasis? Men have no symptoms. Symptoms are more common in women. When symptoms are present, they will  usually appear within five to twenty-eight days after sex with an infected person. Symptoms may include:  Females Males Painful/more frequent urination Pain when urinating Fever/dull stomach ache or backache Drip from penis Unusual discharge with strong odor from vagina Irritation inside the penis Bleeding between periods Vaginal itching You should call your health care provider or [facility] right away if you  have any of these symptoms or if you have had sex with someone who has trichomoniasis or has these  symptoms. Since the symptoms of STIs often resemble one another, diagnosis by a health care provider is the  only way to know for sure if you are infected. Is trichomoniasis dangerous? If left untreated, it may: . Cause a pregnant woman to have babies who are born early or at a low birth weight (less than five  pounds). . Cause pelvic inflammatory disease (PID). PID is an infection of the fallopian tubes or uterus that can affect  a woman's ability to have children. Can you cure trichomoniasis? Yes. Trichomoniasis can be cured with antibiotics. -overTrichomoniasis Facts Can you still have sex? You should no t have sex until: . You have finished all of the medicine. . Anyone you are  having sex with has finished all of their medicine (to prevent re-infection). Who can I call for more information? National STD Hotline: (423)012-2448 www.ashastd.[employer] [place] Department of [employer] or [place]: ([phone]

## 2020-02-19 NOTE — MAU Provider Note (Signed)
[name] is 30 y.o.  G2P1001  at [redacted]w[redacted]d here with complaints of abdominal pain. She reports that she threw up yesterday and one time today. She reports yellowish discharge but no bleeding. She denies fever, SOB, dysuria, constipation, nausea/vomiting. Had one episode of diarrhea yesterday.   History     CSN: [id number]  Arrival date and time: 02/19/20 4098   Event Date/Time   First Provider Initiated Contact with Patient 02/19/20 1011      Chief Complaint  Patient presents with  . Abdominal Pain   Abdominal Pain This is a new problem. The current episode started yesterday. The onset quality is sudden. The problem occurs intermittently. The problem has been gradually worsening. The pain is located in the RLQ and suprapubic region. The pain is at a severity of 6/10. The quality of the pain is cramping. The abdominal pain does not radiate. Associated symptoms include diarrhea. Pertinent negatives include no dysuria, nausea or vomiting. Associated symptoms comments: Diarrhea yesterday one time. .  Vaginal Discharge The patient's primary symptoms include a genital odor and vaginal discharge. This is a new problem. The current episode started in the past 7 days. The problem occurs constantly. Associated symptoms include abdominal pain, back pain and diarrhea. Pertinent negatives include no dysuria, nausea or vomiting. The vaginal discharge was yellow. There has been no bleeding. She has not been passing clots. She has not been passing tissue.    OB History    Gravida  2   Para  1   Term  1   Preterm      AB      Living  1     SAB      IAB      Ectopic      Multiple  0   Live Births  1           Past Medical History:  Diagnosis Date  . Anemia   . Anxiety   . Asthma    albuterol prn  . Headache    migraines  . Hx of trichomoniasis   . Hx of varicella     Past Surgical History:  Procedure Laterality Date  . TONSILLECTOMY      Family History   Problem Relation Age of Onset  . Hypertension Mother   . Hypertension Maternal Grandmother   . Heart attack Maternal Grandmother   . Hypertension Maternal Grandfather   . Cancer Maternal Grandfather   . Diabetes Paternal Grandmother     Social History   Tobacco Use  . Smoking status: Current Some Day Smoker    Types: Cigars  . Smokeless tobacco: Never Used  Substance Use Topics  . Alcohol use: No  . Drug use: No    Allergies: No Known Allergies  Medications Prior to Admission  Medication Sig Dispense Refill Last Dose  . baclofen (LIORESAL) 10 MG tablet Take 10 mg by mouth 3 (three) times daily.     Marland Kitchen docusate sodium (COLACE) 100 MG capsule Take 1 capsule (100 mg total) by mouth every 12 (twelve) hours. 60 capsule 0   . doxycycline (VIBRAMYCIN) 100 MG capsule Take 1 capsule (100 mg total) by mouth 2 (two) times daily. 14 capsule 0   . hydrocortisone (ANUSOL-HC) 2.5 % rectal cream Place 1 application rectally 2 (two) times daily. 30 g 0   . methocarbamol (ROBAXIN) 500 MG tablet Take 1 tablet (500 mg total) by mouth every 8 (eight) hours as needed. 60 tablet 1   .  oxyCODONE-acetaminophen (PERCOCET/ROXICET) 5-325 MG tablet Take 1 tablet by mouth every 6 (six) hours as needed for severe pain. 20 tablet 0   . predniSONE (DELTASONE) 10 MG tablet 6 tabs po day 1, 5 tabs po day 2, 4 tabs po day 3, 3 tabs po day 4, 2 tabs po day 5, 1 tab po day 6 21 tablet 0   . topiramate (TOPAMAX) 25 MG tablet Take 25 mg by mouth 3 (three) times daily.       Review of Systems  Constitutional: Negative.   HENT: Negative.   Respiratory: Negative.   Cardiovascular: Negative.   Gastrointestinal: Positive for abdominal pain and diarrhea. Negative for nausea and vomiting.  Genitourinary: Positive for vaginal discharge. Negative for dysuria.  Musculoskeletal: Positive for back pain.  Allergic/Immunologic: Negative.   Neurological: Negative.    Physical Exam   Blood pressure 119/81, pulse 91,  temperature 97.9 F (36.6 C), temperature source Oral, resp. rate 16, last menstrual period 01/11/2020, unknown if currently breastfeeding.  Physical Exam Constitutional:      Appearance: She is well-developed.  HENT:     Head: Normocephalic.  Abdominal:     General: Abdomen is flat.     Palpations: Abdomen is soft.     Tenderness: There is no abdominal tenderness.  Neurological:     Mental Status: She is alert.     MAU Course  Procedures  MDM -full ectopic work-up done to rule out life threatening emergency -Wet prep is positive for trich>patient was treated in MAU with 2 grams of Flagyl.  -CMP is normal, CBC is normal -Hcg is 1120  -US shows nothing in the uterus, cannot exclude ectopic pregnancy. I have independently reviewed the [place] images, which reveal finding of nothing in the uterus.   Discussed [place] results with patient, patient understands importance of follow-up Assessment and Plan   1. Pregnancy of unknown anatomic location   2. Abdominal pain   -patient treated in MAU for trich and partner RX given -GC pending -ectopic precautions given, appt made for patient to have follow-up appt  On Tuesday morning; she understands that she must be available for 2 hours follow-up and may need to come back to hospital for methotrexate treatment.  Charlesetta Garibaldi Gwen Edler 02/19/2020, 10:16 AM

## 2020-02-20 LAB — GC/CHLAMYDIA PROBE AMP (~~LOC~~) NOT AT ARMC
Chlamydia: NEGATIVE
Comment: NEGATIVE
Comment: NORMAL
Neisseria Gonorrhea: NEGATIVE

## 2020-02-21 ENCOUNTER — Ambulatory Visit (INDEPENDENT_AMBULATORY_CARE_PROVIDER_SITE_OTHER): Payer: Self-pay

## 2020-02-21 ENCOUNTER — Other Ambulatory Visit: Payer: Self-pay

## 2020-02-21 VITALS — BP 128/93 | HR 75

## 2020-02-21 DIAGNOSIS — O3680X0 Pregnancy with inconclusive fetal viability, not applicable or unspecified: Secondary | ICD-10-CM

## 2020-02-21 LAB — BETA HCG QUANT (REF LAB): hCG Quant: 1461 m[IU]/mL

## 2020-02-21 NOTE — Progress Notes (Signed)
Chart reviewed for nurse visit. Agree with plan of care.   Venora Maples, MD 02/21/20 12:58 PM

## 2020-02-21 NOTE — Progress Notes (Signed)
Pt here today for STAT Beta s/p pregnancy of unknown location.   Pt denies vaginal bleeding and pain.  Pt advised that we will call her by noon with results and f/u.  Pt encouraged to write down questions/concerns to have available when called, if any.  Pt verbalized understanding with no further questions.    Received notification from [facility] that results of STAT Beta are 1461.  Reviewed results and pt's sx with Dr. Crissie Reese who recommends to have f/u STAT beta on 02/23/20 due inappropriate rise of beta levels and if levels continue to not rise appropriately pt will need to discuss MTX treatment.  Pt informed of results and f/u.  Pt reported that she will be able to come in on 02/23/20 @ 0815 for STAT Beta.  Front office notified to place on schedule.    Addison Naegeli, RN 02/21/20

## 2020-02-23 ENCOUNTER — Ambulatory Visit (INDEPENDENT_AMBULATORY_CARE_PROVIDER_SITE_OTHER): Payer: Self-pay

## 2020-02-23 ENCOUNTER — Other Ambulatory Visit: Payer: Self-pay

## 2020-02-23 DIAGNOSIS — O3680X0 Pregnancy with inconclusive fetal viability, not applicable or unspecified: Secondary | ICD-10-CM

## 2020-02-23 LAB — BETA HCG QUANT (REF LAB): hCG Quant: 2641 m[IU]/mL

## 2020-02-23 NOTE — Progress Notes (Signed)
Chart reviewed for nurse visit. Agree with plan of care.   Venora Maples, MD 02/23/20 2:09 PM

## 2020-02-23 NOTE — Progress Notes (Signed)
Pt here today for STAT Beta Hcg. Pt denies pain, vaginal bleeding and cramps at this time.  Pt aware will be called with results by noon today and plan of care. Pt agreeable and verbalized understanding.   Judeth Cornfield, RN  02/23/20

## 2020-02-23 NOTE — Progress Notes (Signed)
Reviewed results with Dr Crissie Reese who finds appropriate rise in bhcg from 1/25 but given previous inappropriate rise from 1/23-1/25 patient needs follow up bhcg quant on 1/29 & 1/31. Patient also needs follow up ultrasound scheduled. Scheduled ultrasound 2/8 @ 845.  Called patient and informed her of bhcg results today. Discussed plan of repeat bhcg on 1/29 in MAU with follow up bhcg on 1/31 in office and ultrasound appt. Discussed based off bhcg results in MAU on 1/29, future appts may change. Advised if she begins having pain, difficulty breathing, or bleeding to return to MAU for evaluation. Patient verbalized understanding & had no questions.   Chase Caller RN BSN 02/23/20

## 2020-02-25 ENCOUNTER — Other Ambulatory Visit: Payer: Self-pay

## 2020-02-25 ENCOUNTER — Inpatient Hospital Stay (HOSPITAL_COMMUNITY)
Admission: AD | Admit: 2020-02-25 | Discharge: 2020-02-25 | Disposition: A | Discharge status: Home / Self Care | Payer: Medicaid Other | Attending: Obstetrics and Gynecology | Admitting: Obstetrics and Gynecology

## 2020-02-25 ENCOUNTER — Inpatient Hospital Stay (HOSPITAL_COMMUNITY): Payer: Medicaid Other

## 2020-02-25 DIAGNOSIS — Z3A01 Less than 8 weeks gestation of pregnancy: Secondary | ICD-10-CM | POA: Insufficient documentation

## 2020-02-25 DIAGNOSIS — O208 Other hemorrhage in early pregnancy: Secondary | ICD-10-CM | POA: Insufficient documentation

## 2020-02-25 DIAGNOSIS — O26891 Other specified pregnancy related conditions, first trimester: Secondary | ICD-10-CM | POA: Insufficient documentation

## 2020-02-25 DIAGNOSIS — Z3491 Encounter for supervision of normal pregnancy, unspecified, first trimester: Secondary | ICD-10-CM

## 2020-02-25 DIAGNOSIS — O418X10 Other specified disorders of amniotic fluid and membranes, first trimester, not applicable or unspecified: Secondary | ICD-10-CM

## 2020-02-25 DIAGNOSIS — O3680X0 Pregnancy with inconclusive fetal viability, not applicable or unspecified: Secondary | ICD-10-CM | POA: Insufficient documentation

## 2020-02-25 DIAGNOSIS — R109 Unspecified abdominal pain: Secondary | ICD-10-CM | POA: Insufficient documentation

## 2020-02-25 DIAGNOSIS — O468X1 Other antepartum hemorrhage, first trimester: Secondary | ICD-10-CM

## 2020-02-25 LAB — HCG, QUANTITATIVE, PREGNANCY: hCG, Beta Chain, Quant, S: 6642 m[IU]/mL — ABNORMAL HIGH (ref <5)

## 2020-02-25 NOTE — MAU Provider Note (Signed)
History   Chief Complaint:  Labs Only and Pregnancy Ultrasound   Heather Malone is  30 y.o. G2P1001 Patient's last menstrual period was 01/15/2020 (exact date).. Patient is here for follow up of quantitative HCG and ongoing surveillance of pregnancy status. She is [redacted]w[redacted]d weeks gestation  by LMP.    Since her last visit, the patient is without new complaint. The patient reports bleeding as  none now.  She denies any pain.  General ROS:  negative  Her previous Quantitative HCG values are:  1/23: 1126 1/25: 1461 1/27: 2641  Physical Exam   Blood pressure 129/81, pulse 80, temperature 98.4 F (36.9 C), temperature source Oral, resp. rate 15, height 5\' 2"  (1.575 m), weight 66.4 kg, last menstrual period 01/15/2020, SpO2 100 %, unknown if currently breastfeeding.  Focused Gynecological Exam: examination not indicated  Labs: Results for orders placed or performed during the hospital encounter of 02/25/20 (from the past 24 hour(s))  hCG, quantitative, pregnancy   Collection Time: 02/25/20  7:16 AM  Result Value Ref Range   hCG, Beta Chain, Quant, S 6,642 (H) <5 mIU/mL    Ultrasound Studies:   02/27/20 OB Transvaginal  Result Date: 02/25/2020 CLINICAL DATA:  Pregnancy of unknown location. EXAM: OBSTETRIC <14 WK 02/27/2020 AND TRANSVAGINAL OB US TECHNIQUE: Both transabdominal and transvaginal ultrasound examinations were performed for complete evaluation of the gestation as well as the maternal uterus, adnexal regions, and pelvic cul-de-sac. Transvaginal technique was performed to assess early pregnancy. COMPARISON:  None. FINDINGS: Intrauterine gestational sac: Single Yolk sac:  Present Embryo:  Present Cardiac Activity: Present Heart Rate: 91 bpm CRL:  2.6 mm   5 w   5 d                  [place] EDC: 10/22/2020 Subchorionic hemorrhage:  Large subchorionic hemorrhage. Maternal uterus/adnexae: Normal ovaries.  No free fluid IMPRESSION: 1. Single intrauterine gestation with embryo with  cardiac activity. 2.  Estimated gestational age by crown rump length equals 5 weeks 5 days. 3.  Large subchronic hemorrhage. Electronically Signed   By: 10/24/2020 M.D.   On: 02/25/2020 08:23   Assessment:   1. Normal intrauterine pregnancy on prenatal ultrasound in first trimester   2. [redacted] weeks gestation of pregnancy   3. Subchorionic hemorrhage of placenta in first trimester, single or unspecified fetus     Reviewed results of normal IUP with patient. Discussed [facility] and bleeding expectations at length. Informed patient that upcoming appointments will be cancelled and she can establish prenatal care wherever she desires. List of options given.   Plan: -Discharge home in stable condition -First trimester precautions discussed -Patient advised to follow-up with OB to establish prenatal care -Patient may return to MAU as needed or if her condition were to change or worsen  [facility], CNM 02/25/2020, 10:14 AM

## 2020-02-25 NOTE — Discharge Instructions (Signed)
Safe Medications in Pregnancy   Acne: Benzoyl Peroxide Salicylic Acid  Backache/Headache: Tylenol: 2 regular strength every 4 hours OR              2 Extra strength every 6 hours  Colds/Coughs/Allergies: Benadryl (alcohol free) 25 mg every 6 hours as needed Breath right strips Claritin Cepacol throat lozenges Chloraseptic throat spray Cold-Eeze- up to three times per day Cough drops, alcohol free Flonase (by prescription only) Guaifenesin Mucinex Robitussin DM (plain only, alcohol free) Saline nasal spray/drops Sudafed (pseudoephedrine) & Actifed ** use only after [redacted] weeks gestation and if you do not have high blood pressure Tylenol Vicks Vaporub Zinc lozenges Zyrtec   Constipation: Colace Ducolax suppositories Fleet enema Glycerin suppositories Metamucil Milk of magnesia Miralax Senokot Smooth move tea  Diarrhea: Kaopectate Imodium A-D  *NO pepto Bismol  Hemorrhoids: Anusol Anusol HC Preparation H Tucks  Indigestion: Tums Maalox Mylanta Zantac  Pepcid  Insomnia: Benadryl (alcohol free) 25mg  every 6 hours as needed Tylenol PM Unisom, no Gelcaps  Leg Cramps: Tums MagGel  Nausea/Vomiting:  Bonine Dramamine Emetrol Ginger extract Sea bands Meclizine  Nausea medication to take during pregnancy:  Unisom (doxylamine succinate 25 mg tablets) Take one tablet daily at bedtime. If symptoms are not adequately controlled, the dose can be increased to a maximum recommended dose of two tablets daily (1/2 tablet in the morning, 1/2 tablet mid-afternoon and one at bedtime). Vitamin B6 100mg  tablets. Take one tablet twice a day (up to 200 mg per day).  Skin Rashes: Aveeno products Benadryl cream or 25mg  every 6 hours as needed Calamine Lotion 1% cortisone cream  Yeast infection: Gyne-lotrimin 7 Monistat 7   **If taking multiple medications, please check labels to avoid duplicating the same active ingredients **take medication as directed on  the label ** Do not exceed 4000 mg of tylenol in 24 hours **Do not take medications that contain aspirin or ibuprofen    Prenatal Care Providers           [facility] @ [facility]  [address] [phone]  [facility] for [facility] @ [facility]   [address]  [phone]  [facility] @ [facility]       [address] [phone]            [facility] for [facility] @ [place]     [phone]          [facility] for [facility] @ [facility]   [address] [phone]  [facility] @ [facility]  [address] [phone]     [facility] for [facility] @ Family [address] [place])  [address]   [phone]     [facility]  Phone: [phone]  [facility] [phone] OB/GYN  Phone: [phone]  [phone] OB/GYN Phone: [phone]  [facility] Phone: [phone]  [facility] Phone: [phone]  [facility] Phone: [phone]  [facility]  Phone: [phone]   Subchorionic Hematoma  A hematoma is a collection of blood outside of the blood vessels. A subchorionic hematoma is a collection of blood between the outer wall of the embryo (chorion) and the inner wall of the uterus. This condition can cause vaginal bleeding. Early small hematomas usually shrink on their own and do not affect your baby or pregnancy. When bleeding starts later in pregnancy, or if the hematoma  is larger or occurs in older pregnant women, the condition may be more serious. Larger hematomas increase the chances of miscarriage. This condition also increases the risk of:  Premature separation of the placenta from the uterus.  Premature (preterm) labor.  Stillbirth. What are the causes? The exact cause of this condition is not known. It occurs  when blood is trapped between the placenta and the uterine wall because the placenta has separated from the original site of implantation. What increases the risk? You are more likely to develop this condition if:  You were treated with fertility medicines.  You became pregnant through in vitro fertilization (IVF). What are the signs or symptoms? Symptoms of this condition include:  Vaginal spotting or bleeding.  Abdominal pain. This is rare. Sometimes you may have no symptoms and the bleeding may only be seen when ultrasound images are taken (transvaginal ultrasound). How is this diagnosed? This condition is diagnosed based on a physical exam. This includes a pelvic exam. You may also have other tests, including:  Blood tests.  Urine tests.  Ultrasound of the abdomen. How is this treated? Treatment for this condition can vary. Treatment may include:  Watchful waiting. You will be monitored closely for any changes in bleeding.  Medicines.  Activity restriction. This may be needed until the bleeding stops.  A medicine called Rh immunoglobulin. This is given if you have an Rh-negative blood type. It prevents Rh sensitization. Follow these instructions at home:  Stay on bed rest if told to do so by your health care provider.  Do not lift anything that is heavier than 10 lb (4.5 kg), or the limit that you are told by your health care provider.  Track and write down the number of pads you use each day and how soaked (saturated) they are.  Do not use tampons.  Keep all follow-up visits. This is important. Your health care provider may ask you to have follow-up blood tests or ultrasound tests or both. Contact a health care provider if:  You have any vaginal bleeding.  You have a fever. Get help right away if:  You have severe cramps in your stomach, back, abdomen, or pelvis.  You pass large clots or tissue. Save any tissue for your health care provider to look at.  You  faint.  You become light-headed or weak. Summary  A subchorionic hematoma is a collection of blood between the outer wall of the embryo (chorion) and the inner wall of the uterus.  This condition can cause vaginal bleeding.  Sometimes you may have no symptoms and the bleeding may only be seen when ultrasound images are taken.  Treatment may include watchful waiting, medicines, or activity restriction.  Keep all follow-up visits. Get help right away if you have severe cramps or heavy vaginal bleeding. This information is not intended to replace advice given to you by your health care provider. Make sure you discuss any questions you have with your health care provider. Document Revised: 10/10/2019 Document Reviewed: 10/10/2019 Elsevier Patient Education  2021 [employer].

## 2020-02-25 NOTE — Progress Notes (Signed)
Patient left prior to receipt of AVS and discharge instructions from RN.  Follow-up care and instructions were given by Cleone Slim, CNM.

## 2020-02-25 NOTE — MAU Note (Signed)
.  Heather Malone is a 30 y.o. at [redacted]w[redacted]d here in MAU reporting for her blood draw and repeat ultrasound. Denies pain or vaginal bleeding. Reports occasional heart burn.  Pain score: 0/10 Vitals:   02/25/20 0708  BP: 129/81  Pulse: 80  Resp: 15  Temp: 98.4 F (36.9 C)  SpO2: 100%

## 2020-02-27 ENCOUNTER — Ambulatory Visit: Payer: Medicaid Other

## 2020-03-01 ENCOUNTER — Telehealth: Payer: Self-pay | Admitting: Family Medicine

## 2020-03-01 NOTE — Telephone Encounter (Signed)
Pt states she still has irritation and discharge from STD she states she had confirmation on 02-19-20 or 02-25-20. Pt request a call back to see if this is normal process or she needs to come back in . thanks

## 2020-03-05 MED ORDER — TERCONAZOLE 0.4 % VA CREA: 1.0000 | TOPICAL_CREAM | Freq: Every day | VAGINAL | 0 refills | Status: DC

## 2020-03-05 NOTE — Addendum Note (Signed)
Addended by: Ed Blalock on: 03/05/2020 04:21 PM   Modules accepted: Orders

## 2020-03-05 NOTE — Telephone Encounter (Addendum)
Patient call and has concerns with thick white discharge. She has some itching and burning to the vaginal area. Terazol ordered per standing order. Patient to call back as needed.

## 2020-03-06 ENCOUNTER — Ambulatory Visit: Payer: Medicaid Other

## 2020-03-16 ENCOUNTER — Inpatient Hospital Stay (HOSPITAL_COMMUNITY): Payer: Medicaid Other

## 2020-03-16 ENCOUNTER — Inpatient Hospital Stay (HOSPITAL_COMMUNITY)
Admission: AD | Admit: 2020-03-16 | Discharge: 2020-03-16 | Disposition: A | Discharge status: Home / Self Care | Payer: Medicaid Other | Attending: Obstetrics and Gynecology | Admitting: Obstetrics and Gynecology

## 2020-03-16 ENCOUNTER — Encounter (HOSPITAL_COMMUNITY): Payer: Self-pay | Admitting: Obstetrics and Gynecology

## 2020-03-16 ENCOUNTER — Other Ambulatory Visit: Payer: Self-pay

## 2020-03-16 DIAGNOSIS — O26891 Other specified pregnancy related conditions, first trimester: Secondary | ICD-10-CM

## 2020-03-16 DIAGNOSIS — Z3A08 8 weeks gestation of pregnancy: Secondary | ICD-10-CM | POA: Insufficient documentation

## 2020-03-16 DIAGNOSIS — O99331 Smoking (tobacco) complicating pregnancy, first trimester: Secondary | ICD-10-CM | POA: Insufficient documentation

## 2020-03-16 DIAGNOSIS — F17290 Nicotine dependence, other tobacco product, uncomplicated: Secondary | ICD-10-CM | POA: Diagnosis not present

## 2020-03-16 DIAGNOSIS — R109 Unspecified abdominal pain: Secondary | ICD-10-CM | POA: Diagnosis not present

## 2020-03-16 DIAGNOSIS — N949 Unspecified condition associated with female genital organs and menstrual cycle: Secondary | ICD-10-CM

## 2020-03-16 DIAGNOSIS — O210 Mild hyperemesis gravidarum: Secondary | ICD-10-CM | POA: Diagnosis not present

## 2020-03-16 DIAGNOSIS — O219 Vomiting of pregnancy, unspecified: Secondary | ICD-10-CM

## 2020-03-16 LAB — CBC
HCT: 34.6 % — ABNORMAL LOW (ref 36.0–46.0)
Hemoglobin: 11.5 g/dL — ABNORMAL LOW (ref 12.0–15.0)
MCH: 27.8 pg (ref 26.0–34.0)
MCHC: 33.2 g/dL (ref 30.0–36.0)
MCV: 83.6 fL (ref 80.0–100.0)
Platelets: 269 10*3/uL (ref 150–400)
RBC: 4.14 MIL/uL (ref 3.87–5.11)
RDW: 11.8 % (ref 11.5–15.5)
WBC: 5.1 10*3/uL (ref 4.0–10.5)
nRBC: 0 % (ref 0.0–0.2)

## 2020-03-16 LAB — COMPREHENSIVE METABOLIC PANEL
ALT: 12 U/L (ref 0–44)
AST: 15 U/L (ref 15–41)
Albumin: 3.8 g/dL (ref 3.5–5.0)
Alkaline Phosphatase: 24 U/L — ABNORMAL LOW (ref 38–126)
Anion gap: 9 (ref 5–15)
BUN: 5 mg/dL — ABNORMAL LOW (ref 6–20)
CO2: 21 mmol/L — ABNORMAL LOW (ref 22–32)
Calcium: 9.5 mg/dL (ref 8.9–10.3)
Chloride: 101 mmol/L (ref 98–111)
Creatinine, Ser: 0.52 mg/dL (ref 0.44–1.00)
GFR, Estimated: >60 mL/min (ref >60)
Glucose, Bld: 86 mg/dL (ref 70–99)
Potassium: 4.1 mmol/L (ref 3.5–5.1)
Sodium: 131 mmol/L — ABNORMAL LOW (ref 135–145)
Total Bilirubin: 0.5 mg/dL (ref 0.3–1.2)
Total Protein: 7.2 g/dL (ref 6.5–8.1)

## 2020-03-16 LAB — URINALYSIS, ROUTINE W REFLEX MICROSCOPIC
Bilirubin Urine: NEGATIVE
Glucose, UA: NEGATIVE mg/dL
Hgb urine dipstick: NEGATIVE
Ketones, ur: NEGATIVE mg/dL
Leukocytes,Ua: NEGATIVE
Nitrite: NEGATIVE
Protein, ur: NEGATIVE mg/dL
Specific Gravity, Urine: 1.021 (ref 1.005–1.030)
pH: 6 (ref 5.0–8.0)

## 2020-03-16 MED ORDER — ONDANSETRON 8 MG PO TBDP: 8.0000 mg | ORAL_TABLET | Freq: Three times a day (TID) | ORAL | 0 refills | Status: DC | PRN

## 2020-03-16 MED ORDER — ACETAMINOPHEN 160 MG/5ML PO SOLN
650.0000 mg | Freq: Four times a day (QID) | ORAL | Status: DC | PRN
  Administered 2020-03-16: 650 mg via ORAL
  Filled 2020-03-16 (×2): qty 20.3

## 2020-03-16 MED ORDER — ONDANSETRON HCL 4 MG/2ML IJ SOLN
4.0000 mg | Freq: Once | INTRAMUSCULAR | Status: AC
  Administered 2020-03-16: 4 mg via INTRAVENOUS
  Filled 2020-03-16: qty 2

## 2020-03-16 MED ORDER — LACTATED RINGERS IV BOLUS
2000.0000 mL | Freq: Once | INTRAVENOUS | Status: AC
  Administered 2020-03-16: 2000 mL via INTRAVENOUS

## 2020-03-16 MED ORDER — PROMETHAZINE HCL 25 MG/ML IJ SOLN
25.0000 mg | Freq: Once | INTRAMUSCULAR | Status: AC
  Administered 2020-03-16: 25 mg via INTRAVENOUS
  Filled 2020-03-16: qty 1

## 2020-03-16 MED ORDER — GLYCOPYRROLATE 0.2 MG/ML IJ SOLN
0.2000 mg | Freq: Once | INTRAMUSCULAR | Status: AC
  Administered 2020-03-16: 0.2 mg via INTRAVENOUS
  Filled 2020-03-16: qty 1

## 2020-03-16 MED ORDER — GLYCOPYRROLATE 1 MG PO TABS: 1.0000 mg | ORAL_TABLET | Freq: Three times a day (TID) | ORAL | 0 refills | Status: DC

## 2020-03-16 MED ORDER — ACETAMINOPHEN 500 MG PO TABS
1000.0000 mg | ORAL_TABLET | Freq: Once | ORAL | Status: DC
  Filled 2020-03-16: qty 2

## 2020-03-16 NOTE — MAU Note (Signed)
Heather Malone is a 30 y.o. at [redacted]w[redacted]d here in MAU reporting: lower abdominal for the past few days. Has been having nausea and vomiting but for the past 24 hours she has not been able to keep anything down. Emesis > 10 times. No bleeding. Having some discharge, it is yellow with a slight odor. Also has a headache.   Onset of complaint: ongoing  Pain score: abdominal pain 5/10, headache 5/10  Vitals:   03/16/20 1238  BP: 108/87  Pulse: 87  Resp: 16  Temp: 98.6 F (37 C)  SpO2: 99%     Lab orders placed from triage: UA, unable to give sample

## 2020-03-16 NOTE — MAU Provider Note (Addendum)
Chief Complaint:  Nausea, Emesis, Abdominal Pain, Vaginal Discharge, and Headache   Event Date/Time   First Provider Initiated Contact with Patient 03/16/20 1250     HPI: Heather Malone is a 30 y.o. G2P1001 at [redacted]w[redacted]d who presents to maternity admissions reporting severe nausea/vomiting that has been going on for the past two weeks, but has gotten more severe in the past 24hrs. She has vomited >10x in the past two hours, cannot keep anything down and has not been able to pee for several hours. She also reports spitting, lower abdominal cramping, mostly on the left side and a mild headache (she has not taken anything for it). Denies vaginal bleeding, but endorses yellow vaginal discharge with a slight odor.    Pregnancy Course: Has just established care at [facility], seen in MAU 02/25/20 = [facility] with cardiac activity and large [facility]  Past Medical History:  Diagnosis Date  . Anemia   . Anxiety   . Asthma    albuterol prn  . Headache    migraines  . Hx of trichomoniasis   . Hx of varicella    OB History  Gravida Para Term Preterm AB Living  2 1 1     1   SAB IAB Ectopic Multiple Live Births        0 1    # Outcome Date GA Lbr Len/2nd Weight Sex Delivery Anes PTL Lv  2 Current           1 Term 09/05/14 [redacted]w[redacted]d 1578:09 / 00:10 3340 g M Vag-Spont None  LIV   Past Surgical History:  Procedure Laterality Date  . TONSILLECTOMY     Family History  Problem Relation Age of Onset  . Hypertension Mother   . Hypertension Maternal Grandmother   . Heart attack Maternal Grandmother   . Hypertension Maternal Grandfather   . Cancer Maternal Grandfather   . Diabetes Paternal Grandmother    Social History   Tobacco Use  . Smoking status: Current Some Day Smoker    Types: Cigars  . Smokeless tobacco: Never Used  Substance Use Topics  . Alcohol use: No  . Drug use: Not Currently    Types: Marijuana    Comment: none with + UPT   No Known Allergies Medications Prior to Admission  Medication Sig  Dispense Refill Last Dose  . terconazole (TERAZOL 7) 0.4 % vaginal cream Place 1 applicator vaginally at bedtime. 45 g 0    I have reviewed patient's Past Medical Hx, Surgical Hx, Family Hx, Social Hx, medications and allergies.   ROS:  Review of Systems  Constitutional: Negative for chills and fever.  HENT: Negative for congestion and sore throat.   Respiratory: Negative for cough and shortness of breath.   Gastrointestinal: Positive for abdominal pain (lower left to middle), nausea and vomiting.  Genitourinary: Negative for vaginal bleeding.  Musculoskeletal: Negative for back pain.  Neurological: Positive for headaches. Negative for dizziness, syncope and light-headedness.   Physical Exam   Patient Vitals for the past 24 hrs:  BP Temp Temp src Pulse Resp SpO2  03/16/20 1238 108/87 98.6 F (37 C) Oral 87 16 99 %   Constitutional: Well-developed, well-nourished female  Cardiovascular: normal rate & rhythm, no murmur Respiratory: normal effort, lung sounds clear throughout GI: Abd soft, non-tender, gravid appropriate for gestational age. Pos BS x 4 MS: Extremities nontender, no edema, normal ROM Neurologic: Alert and oriented x 4.  GU: no CVA tenderness Pelvic exam deferred   Labs: Results for  orders placed or performed during the hospital encounter of 03/16/20 (from the past 24 hour(s))  CBC     Status: Abnormal   Collection Time: 03/16/20  1:24 PM  Result Value Ref Range   WBC 5.1 4.0 - 10.5 K/uL   RBC 4.14 3.87 - 5.11 MIL/uL   Hemoglobin 11.5 (L) 12.0 - 15.0 g/dL   HCT 28.3 (L) 15.1 - 76.1 %   MCV 83.6 80.0 - 100.0 fL   MCH 27.8 26.0 - 34.0 pg   MCHC 33.2 30.0 - 36.0 g/dL   RDW 60.7 37.1 - 06.2 %   Platelets 269 150 - 400 K/uL   nRBC 0.0 0.0 - 0.2 %  Comprehensive metabolic panel     Status: Abnormal   Collection Time: 03/16/20  1:24 PM  Result Value Ref Range   Sodium 131 (L) 135 - 145 mmol/L   Potassium 4.1 3.5 - 5.1 mmol/L   Chloride 101 98 - 111 mmol/L   CO2  21 (L) 22 - 32 mmol/L   Glucose, Bld 86 70 - 99 mg/dL   BUN 5 (L) 6 - 20 mg/dL   Creatinine, Ser 6.94 0.44 - 1.00 mg/dL   Calcium 9.5 8.9 - 85.4 mg/dL   Total Protein 7.2 6.5 - 8.1 g/dL   Albumin 3.8 3.5 - 5.0 g/dL   AST 15 15 - 41 U/L   ALT 12 0 - 44 U/L   Alkaline Phosphatase 24 (L) 38 - 126 U/L   Total Bilirubin 0.5 0.3 - 1.2 mg/dL   GFR, Estimated >62 >70 mL/min   Anion gap 9 5 - 15  Urinalysis, Routine w reflex microscopic Urine, Clean Catch     Status: Abnormal   Collection Time: 03/16/20  1:56 PM  Result Value Ref Range   Color, Urine YELLOW YELLOW   APPearance HAZY (A) CLEAR   Specific Gravity, Urine 1.021 1.005 - 1.030   pH 6.0 5.0 - 8.0   Glucose, UA NEGATIVE NEGATIVE mg/dL   Hgb urine dipstick NEGATIVE NEGATIVE   Bilirubin Urine NEGATIVE NEGATIVE   Ketones, ur NEGATIVE NEGATIVE mg/dL   Protein, ur NEGATIVE NEGATIVE mg/dL   Nitrite NEGATIVE NEGATIVE   Leukocytes,Ua NEGATIVE NEGATIVE    Imaging:  US OB Comp Less 14 Wks  Result Date: 03/16/2020 CLINICAL DATA:  Abdominal pain.  Pregnancy. EXAM: OBSTETRIC <14 WK ULTRASOUND TECHNIQUE: Transabdominal ultrasound was performed for evaluation of the gestation as well as the maternal uterus and adnexal regions. COMPARISON:  02/25/2020 FINDINGS: Intrauterine gestational sac: Single Yolk sac:  Visualized. Embryo:  Visualized. Cardiac Activity: Visualized. Heart Rate: 182 bpm CRL:   18.6 mm   8 w 2 d                  [place] EDC: 10/24/2020 Subchorionic hemorrhage:  Small, decreased from prior. Maternal uterus/adnexae: Right ovarian corpus luteal cyst. Unremarkable left ovary. No free fluid within the pelvis. IMPRESSION: 1. Single live intrauterine gestation measuring 8 weeks 2 days by crown-rump length. 2. Active embryonic heart tones at 182 bpm. 3. Small residual subchorionic hemorrhage, decreased from prior. Electronically Signed   By: Duanne Guess D.O.   On: 03/16/2020 15:19    MAU Course: Orders Placed This Encounter  Procedures   . US OB Comp Less 14 Wks  . Urinalysis, Routine w reflex microscopic Urine, Clean Catch  . CBC  . Comprehensive metabolic panel  . Discharge patient Discharge disposition: 01-Home or Self Care; Discharge patient date: 03/16/2020   Meds ordered  this encounter  Medications  . lactated ringers bolus 2,000 mL  . promethazine (PHENERGAN) injection 25 mg  . ondansetron (ZOFRAN) injection 4 mg  . glycopyrrolate (ROBINUL) injection 0.2 mg  . acetaminophen (TYLENOL) tablet 1,000 mg  . acetaminophen (TYLENOL) 160 MG/5ML solution 650 mg  . ondansetron (ZOFRAN ODT) 8 MG disintegrating tablet    Sig: Take 1 tablet (8 mg total) by mouth every 8 (eight) hours as needed for nausea or vomiting.    Dispense:  60 tablet    Refill:  0    Order Specific Question:   Supervising Provider    Answer:   Reva Bores [[id number]]  . glycopyrrolate (ROBINUL) 1 MG tablet    Sig: Take 1 tablet (1 mg total) by mouth 3 (three) times daily.    Dispense:  60 tablet    Refill:  0    Order Specific Question:   Supervising Provider    Answer:   Reva Bores [[id number]]   MDM: LR bolus + phenergan, zofran and robinul for her nausea/vomiting/spitting  - headache and cramping may be due to dehydration, will reassess pain after first bag of fluids in  Nausea improved but pain remains after first LR bolus, pt has history of large SCH and low FHR - will repeat U/S and order Tylenol for headache.  Care turned over to Luna Kitchens at 1500. Edd Arbour, CNM, MSN, IBCLC  Assumed care of patient at 1500 Patient reports moderate improvement in symptoms, still slightly tender to palpation. Patient has 2nd bag of fluid infusing via IV pump.  [place] reports show viable fetus with cardiac activity and [facility] now reduced in size; measuring "small" per official read.   Patient reassured by US findings, reports that vomiting has stopped but she still feels some abdominal pain   Assessment: 1. Nausea and vomiting during pregnancy    2. Abdominal pain during pregnancy in first trimester   3. Round ligament pain    Plan: Discharge home in stable condition.  Patient stable for discharge Reviewed normalcy of these findings in pregnancy; reviewed warning signs and when to return to MAU. Return if unable to keep down liquids, increased in pain or vaginal bleeding.  All questions answered.

## 2020-03-16 NOTE — Discharge Instructions (Signed)

## 2020-04-12 ENCOUNTER — Ambulatory Visit (INDEPENDENT_AMBULATORY_CARE_PROVIDER_SITE_OTHER): Payer: Medicaid Other | Admitting: Certified Nurse Midwife

## 2020-04-12 ENCOUNTER — Other Ambulatory Visit (HOSPITAL_COMMUNITY)
Admission: RE | Admit: 2020-04-12 | Discharge: 2020-04-12 | Disposition: A | Discharge status: Home / Self Care | Payer: Medicaid Other | Source: Ambulatory Visit | Attending: Certified Nurse Midwife | Admitting: Certified Nurse Midwife

## 2020-04-12 ENCOUNTER — Encounter: Payer: Self-pay | Admitting: Certified Nurse Midwife

## 2020-04-12 ENCOUNTER — Other Ambulatory Visit: Payer: Self-pay

## 2020-04-12 VITALS — BP 111/92 | Wt 137.5 lb

## 2020-04-12 DIAGNOSIS — R519 Other chronic pain: Secondary | ICD-10-CM | POA: Insufficient documentation

## 2020-04-12 DIAGNOSIS — G44221 Chronic tension-type headache, intractable: Secondary | ICD-10-CM

## 2020-04-12 DIAGNOSIS — N898 Other specified noninflammatory disorders of vagina: Secondary | ICD-10-CM | POA: Diagnosis present

## 2020-04-12 DIAGNOSIS — Z3A12 12 weeks gestation of pregnancy: Secondary | ICD-10-CM

## 2020-04-12 DIAGNOSIS — Z3490 Encounter for supervision of normal pregnancy, unspecified, unspecified trimester: Secondary | ICD-10-CM | POA: Insufficient documentation

## 2020-04-12 DIAGNOSIS — G8929 Other chronic pain: Secondary | ICD-10-CM | POA: Insufficient documentation

## 2020-04-12 DIAGNOSIS — O219 Vomiting of pregnancy, unspecified: Secondary | ICD-10-CM | POA: Insufficient documentation

## 2020-04-12 MED ORDER — MAG-OXIDE 200 MG PO TABS: 400.0000 mg | ORAL_TABLET | Freq: Every day | ORAL | 3 refills | Status: DC

## 2020-04-12 MED ORDER — BUTALBITAL-APAP-CAFFEINE 50-325-40 MG PO CAPS: 1.0000 | ORAL_CAPSULE | Freq: Four times a day (QID) | ORAL | 3 refills | Status: DC | PRN

## 2020-04-12 NOTE — Progress Notes (Signed)
Patient complains of round ligament pain that happens about twice a week, also complains of headaches that she gets "everyday", it is like a lingering pain. She also stated that she has "specks" in her vision. Last but not least, she stated that her right arm and leg feels like "dead weight" and "numb" She did not have any medical problems in pervious pregnancy but patient's mom does have high blood pressure.   Medicaid home form was signed

## 2020-04-12 NOTE — Patient Instructions (Addendum)
Take 200-400mg  magnesium + 2-5mg  melatonin nightly for sleep.    http://www.bray.com/https://www.cdc.gov/pregnancy/infections.html">  First Trimester of Pregnancy  The first trimester of pregnancy starts on the first day of your last menstrual period until the end of week 12. This is also called months 1 through 3 of pregnancy. Body changes during your first trimester Your body goes through many changes during pregnancy. The changes usually return to normal after your baby is born. Physical changes  You may gain or lose weight.  Your breasts may grow larger and hurt. The area around your nipples may get darker.  Dark spots or blotches may develop on your face.  You may have changes in your hair. Health changes  You may feel like you might vomit (nauseous), and you may vomit.  You may have heartburn.  You may have headaches.  You may have trouble pooping (constipation).  Your gums may bleed. Other changes  You may get tired easily.  You may pee (urinate) more often.  Your menstrual periods will stop.  You may not feel hungry.  You may want to eat certain kinds of food.  You may have changes in your emotions from day to day.  You may have more dreams. Follow these instructions at home: Medicines  Take over-the-counter and prescription medicines only as told by your doctor. Some medicines are not safe during pregnancy.  Take a prenatal vitamin that contains at least 600 micrograms (mcg) of folic acid. Eating and drinking  Eat healthy meals that include: ? Fresh fruits and vegetables. ? Whole grains. ? Good sources of protein, such as meat, eggs, or tofu. ? Low-fat dairy products.  Avoid raw meat and unpasteurized juice, milk, and cheese.  If you feel like you may vomit, or you vomit: ? Eat 4 or 5 small meals a day instead of 3 large meals. ? Try eating a few soda crackers. ? Drink liquids between meals instead of during meals.  You may need to take these actions to prevent  or treat trouble pooping: ? Drink enough fluids to keep your pee (urine) pale yellow. ? Eat foods that are high in fiber. These include beans, whole grains, and fresh fruits and vegetables. ? Limit foods that are high in fat and sugar. These include fried or sweet foods. Activity  Exercise only as told by your doctor. Most people can do their usual exercise routine during pregnancy.  Stop exercising if you have cramps or pain in your lower belly (abdomen) or low back.  Do not exercise if it is too hot or too humid, or if you are in a place of great height (high altitude).  Avoid heavy lifting.  If you choose to, you may have sex unless your doctor tells you not to. Relieving pain and discomfort  Wear a good support bra if your breasts are sore.  Rest with your legs raised (elevated) if you have leg cramps or low back pain.  If you have bulging veins (varicose veins) in your legs: ? Wear support hose as told by your doctor. ? Raise your feet for 15 minutes, 3-4 times a day. ? Limit salt in your food. Safety  Wear your seat belt at all times when you are in a car.  Talk with your doctor if someone is hurting you or yelling at you.  Talk with your doctor if you are feeling sad or have thoughts of hurting yourself. Lifestyle  Do not use hot tubs, steam rooms, or saunas.  Do not  douche. Do not use tampons or scented sanitary pads.  Do not use herbal medicines, illegal drugs, or medicines that are not approved by your doctor. Do not drink alcohol.  Do not smoke or use any products that contain nicotine or tobacco. If you need help quitting, ask your doctor.  Avoid cat litter boxes and soil that is used by cats. These carry germs that can cause harm to the baby and can cause a loss of your baby by miscarriage or stillbirth. General instructions  Keep all follow-up visits. This is important.  Ask for help if you need counseling or if you need help with nutrition. Your doctor  can give you advice or tell you where to go for help.  Visit your dentist. At home, brush your teeth with a soft toothbrush. Floss gently.  Write down your questions. Take them to your prenatal visits. Where to find more information  American Pregnancy Association: americanpregnancy.org  [employer]nec[employer]: www.acog.org  Office on Women's Health: MightyRew[employer].co.nz Contact a doctor if:  You are dizzy.  You have a fever.  You have mild cramps or pressure in your lower belly.  You have a nagging pain in your belly area.  You continue to feel like you may vomit, you vomit, or you have watery poop (diarrhea) for 24 hours or longer.  You have a bad-smelling fluid coming from your vagina.  You have pain when you pee.  You are exposed to a disease that spreads from person to person, such as chickenpox, measles, Zika virus, HIV, or hepatitis. Get help right away if:  You have spotting or bleeding from your vagina.  You have very bad belly cramping or pain.  You have shortness of breath or chest pain.  You have any kind of injury, such as from a fall or a car crash.  You have new or increased pain, swelling, or redness in an arm or leg. Summary  The first trimester of pregnancy starts on the first day of your last menstrual period until the end of week 12 (months 1 through 3).  Eat 4 or 5 small meals a day instead of 3 large meals.  Do not smoke or use any products that contain nicotine or tobacco. If you need help quitting, ask your doctor.  Keep all follow-up visits. This information is not intended to replace advice given to you by your health care provider. Make sure you discuss any questions you have with your health care provider. Document Revised: 06/22/2019 Document Reviewed: 04/28/2019 Elsevier Patient Education  2021 Elsevier Inc.  Second Trimester of Pregnancy  The second trimester of pregnancy is from week 13 through  week 27. This is also called months 4 through 6 of pregnancy. This is often the time when you feel your best. During the second trimester:  Morning sickness is less or has stopped.  You may have more energy.  You may feel hungry more often. At this time, your unborn baby (fetus) is growing very fast. At the end of the sixth month, the unborn baby may be up to 12 inches long and weigh about 1 pounds. You will likely start to feel the baby move between 16 and 20 weeks of pregnancy. Body changes during your second trimester Your body continues to go through many changes during this time. The changes vary and generally return to normal after the baby is born. Physical changes  You will gain more weight.  You may start to get stretch  marks on your hips, belly (abdomen), and breasts.  Your breasts will grow and may hurt.  Dark spots or blotches may develop on your face.  A dark line from your belly button to the pubic area (linea nigra) may appear.  You may have changes in your hair. Health changes  You may have headaches.  You may have heartburn.  You may have trouble pooping (constipation).  You may have hemorrhoids or swollen, bulging veins (varicose veins).  Your gums may bleed.  You may pee (urinate) more often.  You may have back pain. Follow these instructions at home: Medicines  Take over-the-counter and prescription medicines only as told by your doctor. Some medicines are not safe during pregnancy.  Take a prenatal vitamin that contains at least 600 micrograms (mcg) of folic acid. Eating and drinking  Eat healthy meals that include: ? Fresh fruits and vegetables. ? Whole grains. ? Good sources of protein, such as meat, eggs, or tofu. ? Low-fat dairy products.  Avoid raw meat and unpasteurized juice, milk, and cheese.  You may need to take these actions to prevent or treat trouble pooping: ? Drink enough fluids to keep your pee (urine) pale yellow. ? Eat  foods that are high in fiber. These include beans, whole grains, and fresh fruits and vegetables. ? Limit foods that are high in fat and sugar. These include fried or sweet foods. Activity  Exercise only as told by your doctor. Most people can do their usual exercise during pregnancy. Try to exercise for 30 minutes at least 5 days a week.  Stop exercising if you have pain or cramps in your belly or lower back.  Do not exercise if it is too hot or too humid, or if you are in a place of great height (high altitude).  Avoid heavy lifting.  If you choose to, you may have sex unless your doctor tells you not to. Relieving pain and discomfort  Wear a good support bra if your breasts are sore.  Take warm water baths (sitz baths) to soothe pain or discomfort caused by hemorrhoids. Use hemorrhoid cream if your doctor approves.  Rest with your legs raised (elevated) if you have leg cramps or low back pain.  If you develop bulging veins in your legs: ? Wear support hose as told by your doctor. ? Raise your feet for 15 minutes, 3-4 times a day. ? Limit salt in your food. Safety  Wear your seat belt at all times when you are in a car.  Talk with your doctor if someone is hurting you or yelling at you a lot. Lifestyle  Do not use hot tubs, steam rooms, or saunas.  Do not douche. Do not use tampons or scented sanitary pads.  Avoid cat litter boxes and soil used by cats. These carry germs that can harm your baby and can cause a loss of your baby by miscarriage or stillbirth.  Do not use herbal medicines, illegal drugs, or medicines that are not approved by your doctor. Do not drink alcohol.  Do not smoke or use any products that contain nicotine or tobacco. If you need help quitting, ask your doctor. General instructions  Keep all follow-up visits. This is important.  Ask your doctor about local prenatal classes.  Ask your doctor about the right foods to eat or for help finding a  counselor. Where to find more information  American Pregnancy Association: americanpregnancy.org  [employer]ologists: www.acog.org  Office on [employer]  Health: MightyReward.co.nz Contact a doctor if:  You have a headache that does not go away when you take medicine.  You have changes in how you see, or you see spots in front of your eyes.  You have mild cramps, pressure, or pain in your lower belly.  You continue to feel like you may vomit (nauseous), you vomit, or you have watery poop (diarrhea).  You have bad-smelling fluid coming from your vagina.  You have pain when you pee or your pee smells bad.  You have very bad swelling of your face, hands, ankles, feet, or legs.  You have a fever. Get help right away if:  You are leaking fluid from your vagina.  You have spotting or bleeding from your vagina.  You have very bad belly cramping or pain.  You have trouble breathing.  You have chest pain.  You faint.  You have not felt your baby move for the time period told by your doctor.  You have new or increased pain, swelling, or redness in an arm or leg. Summary  The second trimester of pregnancy is from week 13 through week 27 (months 4 through 6).  Eat healthy meals.  Exercise as told by your doctor. Most people can do their usual exercise during pregnancy.  Do not use herbal medicines, illegal drugs, or medicines that are not approved by your doctor. Do not drink alcohol.  Call your doctor if you get sick or if you notice anything unusual about your pregnancy. This information is not intended to replace advice given to you by your health care provider. Make sure you discuss any questions you have with your health care provider. Document Revised: 06/22/2019 Document Reviewed: 04/28/2019 Elsevier Patient Education  2021 [employer].

## 2020-04-12 NOTE — Progress Notes (Signed)
History:   Heather Malone is a 30 y.o. G2P1001 at 30w1dby LMP being seen today for her first obstetrical visit.  Her obstetrical history is significant for HG but nothing else. Patient does intend to breast feed, desires a waterbirth and ppBTL. Pregnancy history fully reviewed.  Patient reports decreasing nausea, almost constant headaches that are not well treated with Tylenol. Has previously seen a headache specialist and was on a new medication, but told to stop when she got pregnant. Has moments where she feels a "zinger" shoot down right arm and leg, followed by a short (>569m) of heavy/numbness. Often this occurs with her headaches. Does not know what triggers headaches.      HISTORY: OB History  Gravida Para Term Preterm AB Living  '2 1 1 ' 0 0 1  SAB IAB Ectopic Multiple Live Births  0 0 0 0 1    # Outcome Date GA Lbr Len/2nd Weight Sex Delivery Anes PTL Lv  2 Current           1 Term 09/05/14 [id number] / 00:10 7 lb 5.8 oz (3.34 kg) M Vag-Spont None  LIV     Apgar1: 8  Apgar5: 9    Last pap smear (unknown) and was normal  Past Medical History:  Diagnosis Date  . Anemia   . Anxiety   . Asthma    albuterol prn  . Headache    migraines  . Hx of trichomoniasis   . Hx of varicella    Past Surgical History:  Procedure Laterality Date  . TONSILLECTOMY     Family History  Problem Relation Age of Onset  . Hypertension Mother   . Hypertension Maternal Grandmother   . Heart attack Maternal Grandmother   . Hypertension Maternal Grandfather   . Cancer Maternal Grandfather   . Diabetes Paternal Grandmother    Social History   Tobacco Use  . Smoking status: Former Smoker    Types: Cigars  . Smokeless tobacco: Never Used  Substance Use Topics  . Alcohol use: Not Currently  . Drug use: Not Currently    Types: Marijuana    Comment: none with + UPT   No Known Allergies Current Outpatient Medications on File Prior to Visit  Medication Sig Dispense Refill  .  Prenatal Vit-Fe Fumarate-FA (PRENATAL MULTIVITAMIN) TABS tablet Take 1 tablet by mouth daily at 12 noon.    . gMarland Kitchenycopyrrolate (ROBINUL) 1 MG tablet Take 1 tablet (1 mg total) by mouth 3 (three) times daily. (Patient not taking: Reported on 04/12/2020) 60 tablet 0  . ibuprofen (ADVIL) 600 MG tablet ibuprofen 600 mg tablet  TAKE 1 TABLET PO EVERY 6 HOURS (Patient not taking: Reported on 04/12/2020)    . ondansetron (ZOFRAN ODT) 8 MG disintegrating tablet Take 1 tablet (8 mg total) by mouth every 8 (eight) hours as needed for nausea or vomiting. (Patient not taking: Reported on 04/12/2020) 60 tablet 0  . terconazole (TERAZOL 7) 0.4 % vaginal cream Place 1 applicator vaginally at bedtime. (Patient not taking: Reported on 04/12/2020) 45 g 0   No current facility-administered medications on file prior to visit.    Review of Systems Pertinent items noted in HPI and remainder of comprehensive ROS otherwise negative. Physical Exam:   Vitals:   04/12/20 1352  BP: (!) 111/92  Weight: 137 lb 8 oz (62.4 kg)   Fetal Heart Rate (bpm): 152  Constitutional: Well-developed, well-nourished pregnant female in no acute distress.  HEENT: PERRLA Skin: normal color  and turgor, no rash Cardiovascular: normal rate & rhythm, no murmur Respiratory: normal effort, lung sounds clear throughout GI: Abd soft, non-tender, pos BS x 4, gravid appropriate for gestational age MS: Extremities nontender, no edema, normal ROM Neurologic: Alert and oriented x 4.  GU: no CVA tenderness Pelvic: NEFG, physiologic discharge, no blood, cervix clean. Pap/swabs collected  Assessment:    Pregnancy: G2P1001 Patient Active Problem List   Diagnosis Date Noted  . Supervision of low-risk pregnancy 04/12/2020  . Neck injury, subsequent encounter 07/26/2016  . Placental abruption in third trimester 09/04/2014  . Subchorionic hemorrhage in third trimester 07/26/2014  . Preterm contractions 07/24/2014  . Traumatic injury during  pregnancy in third trimester   . Velamentous insertion of umbilical cord      Plan:    1. Encounter for supervision of low-risk pregnancy, antepartum - Pt doing well, no longer taking robinul and only taking zofran sporadically. Nausea has eased significantly in the past two weeks - Hemoglobin A1c - CBC/D/Plt+RPR+Rh+ABO+Rub Ab... - CHL AMB BABYSCRIPTS SCHEDULE OPTIMIZATION - Culture, OB Urine - Genetic Screening - Cytology - PAP( [place]) - Comp Met (CMET) - Protein / creatinine ratio, urine  2. [redacted] weeks gestation of pregnancy - Routine OB care  2. Chronic tension-type headache, intractable - Pt having some insomnia she thinks may trigger headaches - encouraged her to try melatonin and magnesium nightly for sleep, increase hydration and regular meals now that nausea is improving.  - Butalbital-APAP-Caffeine 50-325-40 MG capsule; Take 1-2 capsules by mouth every 6 (six) hours as needed for headache.  Dispense: 30 capsule; Refill: 3 - Magnesium Oxide (MAG-OXIDE) 200 MG TABS; Take 2 tablets (400 mg total) by mouth at bedtime. If that amount causes loose stools in the am, switch to 265m daily at bedtime.  Dispense: 60 tablet; Refill: 3  Problem list reviewed and updated. Genetic Screening discussed, First trimester screen, Quad screen and NIPS: ordered. Ultrasound discussed; fetal anatomic survey: ordered. Anticipatory guidance about prenatal visits given including labs, ultrasounds, and testing. Discussed usage of Babyscripts and virtual visits as additional source of managing and completing prenatal visits in midst of coronavirus and pandemic.   Encouraged to complete MyChart Registration for her ability to review results, send requests, and have questions addressed.  [facility] with multiple MDs and Advanced Practice Providers was explained to patient; also emphasized that residents, students are part of our team. -  pt prefers APPs for as many visits as possible Routine obstetric precautions reviewed. Encouraged to seek out care at office or emergency room ([facility]) for urgent and/or emergent concerns. Return in about 4 weeks (around 05/10/2020) for IN-PERSON, LOB.    JGaylan Gerold MSN, CNM, ILakesideCertified Nurse Midwife, [facility]

## 2020-04-13 LAB — COMPREHENSIVE METABOLIC PANEL
ALT: 9 IU/L (ref 0–32)
AST: 13 IU/L (ref 0–40)
Albumin/Globulin Ratio: 1.5 (ref 1.2–2.2)
Albumin: 4.2 g/dL (ref 3.9–5.0)
Alkaline Phosphatase: 32 IU/L — ABNORMAL LOW (ref 44–121)
BUN/Creatinine Ratio: 12 (ref 9–23)
BUN: 5 mg/dL — ABNORMAL LOW (ref 6–20)
Bilirubin Total: 0.2 mg/dL (ref 0.0–1.2)
CO2: 19 mmol/L — ABNORMAL LOW (ref 20–29)
Calcium: 9.1 mg/dL (ref 8.7–10.2)
Chloride: 99 mmol/L (ref 96–106)
Creatinine, Ser: 0.42 mg/dL — ABNORMAL LOW (ref 0.57–1.00)
Globulin, Total: 2.8 g/dL (ref 1.5–4.5)
Glucose: 76 mg/dL (ref 65–99)
Potassium: 4 mmol/L (ref 3.5–5.2)
Sodium: 134 mmol/L (ref 134–144)
Total Protein: 7 g/dL (ref 6.0–8.5)
eGFR: 135 mL/min/{1.73_m2} (ref >59)

## 2020-04-13 LAB — CBC/D/PLT+RPR+RH+ABO+RUB AB...
Antibody Screen: NEGATIVE
Basophils Absolute: 0 10*3/uL (ref 0.0–0.2)
Basos: 0 %
EOS (ABSOLUTE): 0.1 10*3/uL (ref 0.0–0.4)
Eos: 1 %
HCV Ab: 0.2 s/co ratio (ref 0.0–0.9)
HIV Screen 4th Generation wRfx: NONREACTIVE
Hematocrit: 29.5 % — ABNORMAL LOW (ref 34.0–46.6)
Hemoglobin: 10.3 g/dL — ABNORMAL LOW (ref 11.1–15.9)
Hepatitis B Surface Ag: NEGATIVE
Immature Grans (Abs): 0 10*3/uL (ref 0.0–0.1)
Immature Granulocytes: 0 %
Lymphocytes Absolute: 1.3 10*3/uL (ref 0.7–3.1)
Lymphs: 25 %
MCH: 28.2 pg (ref 26.6–33.0)
MCHC: 34.9 g/dL (ref 31.5–35.7)
MCV: 81 fL (ref 79–97)
Monocytes Absolute: 0.3 10*3/uL (ref 0.1–0.9)
Monocytes: 6 %
Neutrophils Absolute: 3.5 10*3/uL (ref 1.4–7.0)
Neutrophils: 68 %
Platelets: 308 10*3/uL (ref 150–450)
RBC: 3.65 x10E6/uL — ABNORMAL LOW (ref 3.77–5.28)
RDW: 12.2 % (ref 11.7–15.4)
RPR Ser Ql: NONREACTIVE
Rh Factor: POSITIVE
Rubella Antibodies, IGG: 4.38 index (ref >0.99)
WBC: 5.3 10*3/uL (ref 3.4–10.8)

## 2020-04-13 LAB — PROTEIN / CREATININE RATIO, URINE
Creatinine, Urine: 274.2 mg/dL
Protein, Ur: 20.9 mg/dL
Protein/Creat Ratio: 76 mg/g creat (ref 0–200)

## 2020-04-13 LAB — HCV INTERPRETATION

## 2020-04-13 LAB — HEMOGLOBIN A1C
Est. average glucose Bld gHb Est-mCnc: 117 mg/dL
Hgb A1c MFr Bld: 5.7 % — ABNORMAL HIGH (ref 4.8–5.6)

## 2020-04-14 LAB — CULTURE, OB URINE

## 2020-04-14 LAB — URINE CULTURE, OB REFLEX

## 2020-04-16 ENCOUNTER — Encounter: Payer: Self-pay | Admitting: *Deleted

## 2020-04-16 LAB — CERVICOVAGINAL ANCILLARY ONLY
Bacterial Vaginitis (gardnerella): NEGATIVE
Candida Glabrata: NEGATIVE
Candida Vaginitis: POSITIVE — AB
Comment: NEGATIVE
Comment: NEGATIVE
Comment: NEGATIVE

## 2020-04-17 LAB — CYTOLOGY - PAP
Chlamydia: NEGATIVE
Comment: NEGATIVE
Comment: NEGATIVE
Comment: NEGATIVE
Comment: NORMAL
Diagnosis: NEGATIVE
High risk HPV: NEGATIVE
Neisseria Gonorrhea: NEGATIVE
Trichomonas: NEGATIVE

## 2020-04-18 ENCOUNTER — Telehealth: Payer: Self-pay | Admitting: *Deleted

## 2020-04-18 DIAGNOSIS — B373 Candidiasis of vulva and vagina: Secondary | ICD-10-CM

## 2020-04-18 DIAGNOSIS — B3731 Acute candidiasis of vulva and vagina: Secondary | ICD-10-CM

## 2020-04-18 NOTE — Telephone Encounter (Signed)
Received call from [facility] representative stating that pt had triggered an elevated BP of 125/92. Pt also reported H/A @ time of BP.

## 2020-04-19 MED ORDER — FLUCONAZOLE 150 MG PO TABS: 150.0000 mg | ORAL_TABLET | Freq: Once | ORAL | 0 refills | Status: AC

## 2020-04-19 NOTE — Telephone Encounter (Signed)
Pt came into front office to ask about prescription for yeast infection. Per chart review pt tested positive for vaginal yeast on 04/12/20. Pt requests "capsule form." Diflucan 150 mg once, repeat dose in 3 days if needed, ordered per Alysia Penna, MD. Pt concerned about subchorionic hemorrhage shown on [place] 03/16/20, would like sooner [place] date. I explained this is not a cause for concern, that there is no recommenced intervention at this time. Encouraged pt to follow up at provider visit 05/09/20 if she still has concerns.   Babyscripts alert received that BP elevated on 04/18/20: BP 125/92. Pt reports ongoing history of migraines. Endoreses headache today. Requested pt go home and take BP after sitting down in a quiet place for 10 minutes. Pt to log into Babyscripts.

## 2020-04-20 ENCOUNTER — Encounter (HOSPITAL_COMMUNITY): Payer: Self-pay | Admitting: Obstetrics and Gynecology

## 2020-04-20 ENCOUNTER — Inpatient Hospital Stay (HOSPITAL_COMMUNITY): Payer: Medicaid Other

## 2020-04-20 ENCOUNTER — Other Ambulatory Visit: Payer: Self-pay

## 2020-04-20 ENCOUNTER — Inpatient Hospital Stay (HOSPITAL_COMMUNITY)
Admission: AD | Admit: 2020-04-20 | Discharge: 2020-04-20 | Disposition: A | Discharge status: Home / Self Care | Payer: Medicaid Other | Attending: Obstetrics and Gynecology | Admitting: Obstetrics and Gynecology

## 2020-04-20 DIAGNOSIS — Z87891 Personal history of nicotine dependence: Secondary | ICD-10-CM | POA: Insufficient documentation

## 2020-04-20 DIAGNOSIS — O99011 Anemia complicating pregnancy, first trimester: Secondary | ICD-10-CM | POA: Insufficient documentation

## 2020-04-20 DIAGNOSIS — O26891 Other specified pregnancy related conditions, first trimester: Secondary | ICD-10-CM | POA: Diagnosis not present

## 2020-04-20 DIAGNOSIS — Z8249 Family history of ischemic heart disease and other diseases of the circulatory system: Secondary | ICD-10-CM | POA: Diagnosis not present

## 2020-04-20 DIAGNOSIS — R030 Elevated blood-pressure reading, without diagnosis of hypertension: Secondary | ICD-10-CM | POA: Insufficient documentation

## 2020-04-20 DIAGNOSIS — K219 Gastro-esophageal reflux disease without esophagitis: Secondary | ICD-10-CM | POA: Diagnosis not present

## 2020-04-20 DIAGNOSIS — R1010 Upper abdominal pain, unspecified: Secondary | ICD-10-CM | POA: Diagnosis not present

## 2020-04-20 DIAGNOSIS — D649 Anemia, unspecified: Secondary | ICD-10-CM | POA: Diagnosis not present

## 2020-04-20 DIAGNOSIS — G44209 Tension-type headache, unspecified, not intractable: Secondary | ICD-10-CM | POA: Diagnosis not present

## 2020-04-20 DIAGNOSIS — R531 Weakness: Secondary | ICD-10-CM | POA: Insufficient documentation

## 2020-04-20 DIAGNOSIS — H538 Other visual disturbances: Secondary | ICD-10-CM | POA: Diagnosis not present

## 2020-04-20 DIAGNOSIS — O99611 Diseases of the digestive system complicating pregnancy, first trimester: Secondary | ICD-10-CM | POA: Insufficient documentation

## 2020-04-20 DIAGNOSIS — Z79899 Other long term (current) drug therapy: Secondary | ICD-10-CM | POA: Insufficient documentation

## 2020-04-20 DIAGNOSIS — O99351 Diseases of the nervous system complicating pregnancy, first trimester: Secondary | ICD-10-CM | POA: Diagnosis not present

## 2020-04-20 DIAGNOSIS — O99891 Other specified diseases and conditions complicating pregnancy: Secondary | ICD-10-CM | POA: Diagnosis not present

## 2020-04-20 DIAGNOSIS — O10911 Unspecified pre-existing hypertension complicating pregnancy, first trimester: Secondary | ICD-10-CM | POA: Insufficient documentation

## 2020-04-20 DIAGNOSIS — O99012 Anemia complicating pregnancy, second trimester: Secondary | ICD-10-CM

## 2020-04-20 DIAGNOSIS — Z3A13 13 weeks gestation of pregnancy: Secondary | ICD-10-CM | POA: Diagnosis not present

## 2020-04-20 LAB — COMPREHENSIVE METABOLIC PANEL
ALT: 12 U/L (ref 0–44)
AST: 15 U/L (ref 15–41)
Albumin: 3.6 g/dL (ref 3.5–5.0)
Alkaline Phosphatase: 28 U/L — ABNORMAL LOW (ref 38–126)
Anion gap: 9 (ref 5–15)
BUN: 5 mg/dL — ABNORMAL LOW (ref 6–20)
CO2: 21 mmol/L — ABNORMAL LOW (ref 22–32)
Calcium: 9.2 mg/dL (ref 8.9–10.3)
Chloride: 102 mmol/L (ref 98–111)
Creatinine, Ser: 0.42 mg/dL — ABNORMAL LOW (ref 0.44–1.00)
GFR, Estimated: >60 mL/min (ref >60)
Glucose, Bld: 90 mg/dL (ref 70–99)
Potassium: 3.7 mmol/L (ref 3.5–5.1)
Sodium: 132 mmol/L — ABNORMAL LOW (ref 135–145)
Total Bilirubin: 0.2 mg/dL — ABNORMAL LOW (ref 0.3–1.2)
Total Protein: 7.2 g/dL (ref 6.5–8.1)

## 2020-04-20 LAB — URINALYSIS, ROUTINE W REFLEX MICROSCOPIC
Bilirubin Urine: NEGATIVE
Glucose, UA: NEGATIVE mg/dL
Hgb urine dipstick: NEGATIVE
Ketones, ur: NEGATIVE mg/dL
Nitrite: NEGATIVE
Protein, ur: NEGATIVE mg/dL
Specific Gravity, Urine: 1.021 (ref 1.005–1.030)
pH: 6 (ref 5.0–8.0)

## 2020-04-20 LAB — PROTEIN / CREATININE RATIO, URINE
Creatinine, Urine: 174.39 mg/dL
Protein Creatinine Ratio: 0.07 mg/mg{Cre} (ref 0.00–0.15)
Total Protein, Urine: 13 mg/dL

## 2020-04-20 LAB — CBC
HCT: 29.1 % — ABNORMAL LOW (ref 36.0–46.0)
Hemoglobin: 10 g/dL — ABNORMAL LOW (ref 12.0–15.0)
MCH: 28.2 pg (ref 26.0–34.0)
MCHC: 34.4 g/dL (ref 30.0–36.0)
MCV: 82.2 fL (ref 80.0–100.0)
Platelets: 320 10*3/uL (ref 150–400)
RBC: 3.54 MIL/uL — ABNORMAL LOW (ref 3.87–5.11)
RDW: 12.9 % (ref 11.5–15.5)
WBC: 6.5 10*3/uL (ref 4.0–10.5)
nRBC: 0 % (ref 0.0–0.2)

## 2020-04-20 LAB — LIPASE, BLOOD: Lipase: 42 U/L (ref 11–51)

## 2020-04-20 MED ORDER — FAMOTIDINE 20 MG PO TABS: 20.0000 mg | ORAL_TABLET | Freq: Every day | ORAL | 0 refills | Status: DC

## 2020-04-20 MED ORDER — ALUM & MAG HYDROXIDE-SIMETH 200-200-20 MG/5ML PO SUSP
30.0000 mL | Freq: Once | ORAL | Status: AC
  Administered 2020-04-20: 30 mL via ORAL
  Filled 2020-04-20: qty 30

## 2020-04-20 MED ORDER — DIPHENHYDRAMINE HCL 50 MG/ML IJ SOLN
25.0000 mg | Freq: Once | INTRAMUSCULAR | Status: AC
  Administered 2020-04-20: 25 mg via INTRAVENOUS
  Filled 2020-04-20: qty 1

## 2020-04-20 MED ORDER — LIDOCAINE VISCOUS HCL 2 % MT SOLN
15.0000 mL | Freq: Once | OROMUCOSAL | Status: AC
  Administered 2020-04-20: 15 mL via ORAL
  Filled 2020-04-20: qty 15

## 2020-04-20 MED ORDER — METOCLOPRAMIDE HCL 10 MG PO TABS: 10.0000 mg | ORAL_TABLET | Freq: Four times a day (QID) | ORAL | 0 refills | Status: DC | PRN

## 2020-04-20 MED ORDER — METOCLOPRAMIDE HCL 5 MG/ML IJ SOLN
10.0000 mg | Freq: Once | INTRAMUSCULAR | Status: AC
  Administered 2020-04-20: 10 mg via INTRAVENOUS
  Filled 2020-04-20: qty 2

## 2020-04-20 MED ORDER — DEXAMETHASONE SODIUM PHOSPHATE 10 MG/ML IJ SOLN
10.0000 mg | Freq: Once | INTRAMUSCULAR | Status: AC
  Administered 2020-04-20: 10 mg via INTRAVENOUS
  Filled 2020-04-20: qty 1

## 2020-04-20 MED ORDER — FAMOTIDINE IN NACL 20-0.9 MG/50ML-% IV SOLN
20.0000 mg | Freq: Once | INTRAVENOUS | Status: AC
  Administered 2020-04-20: 20 mg via INTRAVENOUS
  Filled 2020-04-20: qty 50

## 2020-04-20 MED ORDER — LACTATED RINGERS IV BOLUS
1000.0000 mL | Freq: Once | INTRAVENOUS | Status: AC
  Administered 2020-04-20: 1000 mL via INTRAVENOUS

## 2020-04-20 MED ORDER — POLYSACCHARIDE IRON COMPLEX 150 MG PO CAPS: 150.0000 mg | ORAL_CAPSULE | Freq: Every day | ORAL | 0 refills | Status: DC

## 2020-04-20 NOTE — [facility]U Note (Signed)
Heather Malone is a 30 y.o. at [redacted]w[redacted]d here in MAU reporting: for the past 2 days BP has been high, it was 117/91. Has been feeling weak and has upper abdominal pain. No bleeding or discharge. Also has a headache.  Onset of complaint: ongoing  Pain score: abdominal pain 5/10, headache 6/10  Vitals:   04/20/20 1350  BP: 123/85  Pulse: 95  Resp: 16  Temp: 98 F (36.7 C)  SpO2: 99%     Lab orders placed from triage: UA

## 2020-04-20 NOTE — MAU Provider Note (Signed)
History     CSN: [id number]  Arrival date and time: 04/20/20 1331   Event Date/Time   First Provider Initiated Contact with Patient 04/20/20 1423      Chief Complaint  Patient presents with  . Abdominal Pain  . Hypertension  . Fatigue   30 y.o. G2P1001 @13 .2 wks presenting with elevated BP, HA, weakness, and upper abd pain. She reports onset of sx 2 days ago. Reports home BP was 117/91. Denies hx of HTN. HA is occipital and intermittent. Rates pain 6/10. Has not treated it. Associated sx are blurry vision and nausea. Also reports upper abd pain. Pain is bilateral. Pain worsens when she lies on left side. Rates pain 5/10. Associated sx are nausea and occasional vomiting. Reports last BM was 4 days ago. Reports hx of GERD as a child. Denies hx of ulcers. She denies LAP and pelvic pain. Denies VB. Denies fevers but feels chills. Hx of MJ use but denies use since she was 4 weeks.   OB History    Gravida  2   Para  1   Term  1   Preterm      AB      Living  1     SAB      IAB      Ectopic      Multiple  0   Live Births  1           Past Medical History:  Diagnosis Date  . Anemia   . Anxiety   . Asthma    albuterol prn  . Headache    migraines  . Hx of trichomoniasis   . Hx of varicella   . Neck injury, subsequent encounter 07/26/2016  . Placental abruption in third trimester 09/04/2014  . Subchorionic hemorrhage in third trimester 07/26/2014  . Velamentous insertion of umbilical cord     Past Surgical History:  Procedure Laterality Date  . TONSILLECTOMY      Family History  Problem Relation Age of Onset  . Hypertension Mother   . Hypertension Maternal Grandmother   . Heart attack Maternal Grandmother   . Hypertension Maternal Grandfather   . Cancer Maternal Grandfather   . Diabetes Paternal Grandmother     Social History   Tobacco Use  . Smoking status: Former Smoker    Types: Cigars  . Smokeless tobacco: Never Used  Substance Use Topics  .  Alcohol use: Not Currently  . Drug use: Not Currently    Types: Marijuana    Comment: none with + UPT    Allergies: No Known Allergies  No medications prior to admission.    Review of Systems  Constitutional: Positive for chills. Negative for fever.  Eyes: Positive for visual disturbance.  Gastrointestinal: Positive for abdominal pain, constipation, nausea and vomiting.  Genitourinary: Negative for dysuria, frequency, vaginal bleeding and vaginal discharge.  Neurological: Positive for dizziness and headaches. Negative for syncope.   Physical Exam   Blood pressure 117/84, pulse 81, temperature 98 F (36.7 C), temperature source Oral, resp. rate 16, height 5\' 2"  (1.575 m), weight 63 kg, last menstrual period 01/11/2020, SpO2 99 %, unknown if currently breastfeeding.  Physical Exam Vitals and nursing note reviewed.  Constitutional:      Appearance: Normal appearance.  HENT:     Head: Normocephalic and atraumatic.  Cardiovascular:     Rate and Rhythm: Normal rate and regular rhythm.     Heart sounds: Normal heart sounds.  Pulmonary:  Effort: Pulmonary effort is normal. No respiratory distress.     Breath sounds: Normal breath sounds. No stridor. No wheezing, rhonchi or rales.  Abdominal:     General: There is no distension.     Palpations: Abdomen is soft. There is no mass.     Tenderness: There is abdominal tenderness in the left upper quadrant. There is no guarding or rebound.     Hernia: No hernia is present.  Musculoskeletal:        General: Normal range of motion.     Cervical back: Normal range of motion.  Skin:    General: Skin is warm and dry.  Neurological:     General: No focal deficit present.     Mental Status: She is alert and oriented to person, place, and time.  Psychiatric:        Mood and Affect: Mood normal.        Behavior: Behavior normal.   FHT:158  Results for orders placed or performed during the hospital encounter of 04/20/20 (from the past  24 hour(s))  Urinalysis, Routine w reflex microscopic Urine, Clean Catch     Status: Abnormal   Collection Time: 04/20/20  1:52 PM  Result Value Ref Range   Color, Urine YELLOW YELLOW   APPearance HAZY (A) CLEAR   Specific Gravity, Urine 1.021 1.005 - 1.030   pH 6.0 5.0 - 8.0   Glucose, UA NEGATIVE NEGATIVE mg/dL   Hgb urine dipstick NEGATIVE NEGATIVE   Bilirubin Urine NEGATIVE NEGATIVE   Ketones, ur NEGATIVE NEGATIVE mg/dL   Protein, ur NEGATIVE NEGATIVE mg/dL   Nitrite NEGATIVE NEGATIVE   Leukocytes,Ua LARGE (A) NEGATIVE   RBC / HPF 0-5 0 - 5 RBC/hpf   WBC, UA 11-20 0 - 5 WBC/hpf   Bacteria, UA RARE (A) NONE SEEN   Squamous Epithelial / LPF 6-10 0 - 5   Mucus PRESENT    Amorphous Crystal PRESENT   Protein / creatinine ratio, urine     Status: None   Collection Time: 04/20/20  2:41 PM  Result Value Ref Range   Creatinine, Urine 174.39 mg/dL   Total Protein, Urine 13 mg/dL   Protein Creatinine Ratio 0.07 0.00 - 0.15 mg/mg[Cre]  Comprehensive metabolic panel     Status: Abnormal   Collection Time: 04/20/20  2:46 PM  Result Value Ref Range   Sodium 132 (L) 135 - 145 mmol/L   Potassium 3.7 3.5 - 5.1 mmol/L   Chloride 102 98 - 111 mmol/L   CO2 21 (L) 22 - 32 mmol/L   Glucose, Bld 90 70 - 99 mg/dL   BUN 5 (L) 6 - 20 mg/dL   Creatinine, Ser 3.32 (L) 0.44 - 1.00 mg/dL   Calcium 9.2 8.9 - 95.1 mg/dL   Total Protein 7.2 6.5 - 8.1 g/dL   Albumin 3.6 3.5 - 5.0 g/dL   AST 15 15 - 41 U/L   ALT 12 0 - 44 U/L   Alkaline Phosphatase 28 (L) 38 - 126 U/L   Total Bilirubin 0.2 (L) 0.3 - 1.2 mg/dL   GFR, Estimated >88 >41 mL/min   Anion gap 9 5 - 15  Lipase, blood     Status: None   Collection Time: 04/20/20  2:46 PM  Result Value Ref Range   Lipase 42 11 - 51 U/L  CBC     Status: Abnormal   Collection Time: 04/20/20  2:46 PM  Result Value Ref Range   WBC 6.5 4.0 - 10.5  K/uL   RBC 3.54 (L) 3.87 - 5.11 MIL/uL   Hemoglobin 10.0 (L) 12.0 - 15.0 g/dL   HCT 54.2 (L) 70.6 - 23.7 %    MCV 82.2 80.0 - 100.0 fL   MCH 28.2 26.0 - 34.0 pg   MCHC 34.4 30.0 - 36.0 g/dL   RDW 62.8 31.5 - 17.6 %   Platelets 320 150 - 400 K/uL   nRBC 0.0 0.0 - 0.2 %   MAU Course  Procedures LR Reglan Decadron Benadryl GI cocktail  MDM Labs and Abd [place] ordered and reviewed. No signs of acute process. HA and abd pain improved but not resolved. Suspect sx are due anemia and GERD respectively. Discussed home managemnt. BP normal today, no hx of CHTN and too early for gHTN, will follow closely. Stable for discharge home.  Assessment and Plan   1. [redacted] weeks gestation of pregnancy   2. Upper abdominal pain   3. Gastroesophageal reflux disease without esophagitis   4. Acute non intractable tension-type headache   5. Anemia during pregnancy in second trimester    Discharge home Follow up at [facility] as scheduled Rx Pepcid Rx Reglan Rx Ferrex Return precautions  Allergies as of 04/20/2020   No Known Allergies     Medication List    STOP taking these medications   ibuprofen 600 MG tablet Commonly known as: ADVIL   ondansetron 8 MG disintegrating tablet Commonly known as: Zofran ODT   terconazole 0.4 % vaginal cream Commonly known as: TERAZOL 7     TAKE these medications   Butalbital-APAP-Caffeine 50-325-40 MG capsule Take 1-2 capsules by mouth every 6 (six) hours as needed for headache.   famotidine 20 MG tablet Commonly known as: PEPCID Take 1 tablet (20 mg total) by mouth at bedtime.   glycopyrrolate 1 MG tablet Commonly known as: Robinul Take 1 tablet (1 mg total) by mouth 3 (three) times daily.   iron polysaccharides 150 MG capsule Commonly known as: NIFEREX Take 1 capsule (150 mg total) by mouth daily.   Mag-Oxide 200 MG Tabs Generic drug: Magnesium Oxide Take 2 tablets (400 mg total) by mouth at bedtime. If that amount causes loose stools in the am, switch to 200mg  daily at bedtime.   metoCLOPramide 10 MG tablet Commonly known as: Reglan Take 1 tablet (10 mg  total) by mouth every 6 (six) hours as needed for nausea or vomiting.   prenatal multivitamin Tabs tablet Take 1 tablet by mouth daily at 12 noon.       , CNM 04/20/2020, 5:58 PM

## 2020-04-20 NOTE — Discharge Instructions (Signed)
Heartburn During Pregnancy  Heartburn is a type of pain or discomfort in the throat or chest. It may cause a burning feeling. It happens when stomach acid backs up into the part of the body that moves food from your mouth to your stomach (esophagus). This condition is also called acid reflux. Heartburn is common during pregnancy. It usually goes away or gets better after giving birth. What are the causes? This condition is caused by stomach acid that backs up into the part of the body that moves food from your mouth to your stomach. Acid can back up because of: Changing amounts of hormones in the body. Large meals. Certain foods and drinks. Exercise. More acid being made in the stomach. What increases the risk? You are more likely to develop this condition if: You had heartburn before you became pregnant. You have been pregnant more than once before. You are overweight or obese. Heartburn is also likely to happen as you get further along in your pregnancy. The risk is higher in the last 3 months before birth (third trimester). What are the signs or symptoms? Symptoms of this condition include: Burning pain in the chest or lower throat. A bitter taste in the mouth. Coughing. Problems swallowing. Vomiting. A hoarse voice. Asthma. Symptoms may get worse when you lie down or bend over. You may feel worse at night. How is this treated? Treatment for this condition depends on how bad your symptoms are. Your doctor may ask you to: Take over-the-counter medicines for mild heartburn. These medicines include antacids or acid reducers. Take prescription medicines to reduce stomach acid or to protect your stomach. Change your diet. Raise the head of your bed so it is higher than the foot of the bed. Follow these instructions at home: Eating and drinking Do not drink alcohol while you are pregnant. Learn which foods and drinks make you feel worse, and avoid them. Eat small meals often, instead  of large meals. Avoid drinking a lot of liquid with your meals. Avoid eating meals during the 2-3 hours before you go to bed. Avoid lying down right after you eat. Do not exercise right after you eat. Drinks to avoid Coffee and tea (with or without caffeine). Energy drinks and sports drinks. Carbonated drinks or sodas. Citrus fruit juices. Foods to avoid Chocolate and cocoa. Peppermint and mint flavorings. Garlic, onions, and horseradish. Spicy foods and foods that have a lot of acid in them. These include peppers, chili powder, curry powder, vinegar, hot sauces, and barbecue sauce. Citrus fruits, such as oranges, lemons, and limes. Tomato-based foods, such as red sauce, chili, and salsa. Fried and fatty foods, such as donuts, french fries, potato chips, and high-fat dressings. High-fat meats, such as hot dogs, precooked or cured meats, sausage, ham, and bacon. High-fat dairy items, such as whole milk, butter, and cheese. Medicines Take over-the-counter and prescription medicines only as told by your doctor. Do not take aspirin or NSAIDs, such as ibuprofen, unless your doctor tells you to do that. Your doctor may tell you to avoid medicines that have sodium bicarbonate in them. General instructions If told, raise the head of your bed about 6 inches (15 cm). You can do this by putting blocks under the legs. Sleeping with more pillows does not help with heartburn. Do not use any products that contain nicotine or tobacco, such as cigarettes, e-cigarettes, and chewing tobacco. If you need help quitting, ask your doctor. Wear loose-fitting clothing. Try to lower your stress, such as with   more pillows does not help with heartburn.  Do not use any products that contain nicotine or tobacco, such as cigarettes, e-cigarettes, and chewing tobacco. If you need help quitting, ask your doctor.  Wear loose-fitting clothing.  Try to lower your stress, such as with yoga or meditation. If you need help, ask your doctor.  Stay at a healthy weight. If you are overweight, work with your doctor to safely manage your weight.  Keep all follow-up visits as told by your doctor. This  is important. Where to find more information  American Pregnancy Association: americanpregnancy.org Contact a doctor if:  You get new symptoms.  Your symptoms do not get better with treatment.  You lose weight and you do not know why.  You have trouble swallowing.  You make loud sounds when you breathe (wheeze).  You have a cough that does not go away.  You have heartburn often for more than 2 weeks.  You feel like you may vomit (nausea), or you vomit, and this does not get better with treatment.  You have pain in your belly (abdomen). Get help right away if:  You have very bad chest pain that spreads to your arm, neck, or jaw.  You feel sweaty, dizzy, or light-headed.  You have trouble breathing.  You have pain when swallowing.  You vomit, and your vomit looks like blood or coffee grounds.  Your poop (stool) is bloody or black. Summary  Heartburn in pregnancy is common, especially during the last 3 months before birth.  This condition is caused by stomach acid backing up into the part of the body that moves food from the mouth to the stomach.  This condition can be treated with medicines, changes to your diet, or raising the head of your bed.  Contact a doctor if your symptoms do not go away or you get new symptoms. This information is not intended to replace advice given to you by your health care provider. Make sure you discuss any questions you have with your health care provider. Document Revised: 10/06/2018 Document Reviewed: 10/06/2018 Elsevier Patient Education  2021 Elsevier Inc.   Pregnancy and Anemia  Anemia is a condition in which there is not enough red blood cells or hemoglobin in the blood. Hemoglobin is a substance in red blood cells that carries oxygen. When you do not have enough red blood cells or hemoglobin (are anemic), your body cannot get enough oxygen and your organs may not work properly. Anemia is common during pregnancy because your  body needs more blood volume and blood cells to provide nutrition to the unborn baby. What are the causes? The most common cause of anemia during pregnancy is not having enough iron in the body to make red blood cells (iron deficiency anemia). Other causes may include:  Folic acid deficiency.  Vitamin B12 deficiency.  Certain prescription or over-the-counter medicines.  Certain medical conditions or infections that destroy red blood cells.  A low platelet count and bleeding caused by antibodies that go through the placenta to the baby from the mother's blood. What are the signs or symptoms? Mild anemia may not cause any symptoms. If anemia becomes severe, symptoms may include:  Feeling tired or weak.  Shortness of breath, especially during activity.  Fainting.  Pale skin.  Headaches.  A fast or irregular heartbeat.  Dizziness. How is this diagnosed? This condition may be diagnosed based on your medical history and a physical exam. You may also have blood tests. How is this  treated? Treatment for anemia during pregnancy depends on the cause of the anemia. Treatment may include:  Making changes to your diet.  Taking iron, vitamin B12, or folic acid supplements.  Having a blood transfusion. This may be needed if the anemia is severe. Follow these instructions at home: Eating and drinking  Follow recommendations from your health care provider about changing your diet.  Eat a diet rich in iron. This would include foods such as: ? Liver. ? Beef. ? Eggs. ? Whole grains. ? Spinach. ? Dried fruit.  Increase your vitamin C intake. This will help the stomach absorb more iron. Some foods that are high in vitamin C include: ? Oranges. ? Peppers. ? Tomatoes. ? Mangoes.  Eat green leafy vegetables. These are a good source of folic acid. General instructions  Take iron supplements and vitamins as told by your health care provider.  Keep all follow-up visits. This is  important. Contact a health care provider if:  You have headaches that happen often or do not go away.  You bruise easily.  You have a fever.  You have nausea and vomiting for more than 24 hours.  You are unable to take supplements prescribed to treat your anemia. Get help right away if:  You develop signs or symptoms of severe anemia.  You have bleeding from your vagina.  You develop a rash.  You have bloody or tarry stools.  You are very dizzy or you faint. Summary  Anemia is a condition in which there is not enough red blood cells or hemoglobin in the blood.  The most common cause of anemia during pregnancy is not having enough iron in the body to make red blood cells (iron deficiency anemia).  Mild anemia may not cause any symptoms. If it becomes severe, symptoms may include feeling tired and weak.  Take iron supplements and vitamins as told by your health care provider.  Keep all follow-up visits. This is important. This information is not intended to replace advice given to you by your health care provider. Make sure you discuss any questions you have with your health care provider. Document Revised: 05/17/2019 Document Reviewed: 05/17/2019 Elsevier Patient Education  2021 [employer].

## 2020-04-21 LAB — CULTURE, OB URINE: Culture: NO GROWTH

## 2020-05-03 ENCOUNTER — Encounter: Payer: Self-pay | Admitting: *Deleted

## 2020-05-08 ENCOUNTER — Encounter: Payer: Self-pay | Admitting: General Practice

## 2020-05-08 ENCOUNTER — Telehealth: Payer: Self-pay | Admitting: General Practice

## 2020-05-08 NOTE — Telephone Encounter (Signed)
Called patient regarding [facility] lab results- no answer. Left message to call us back or results can be reviewed at her appt tomorrow.

## 2020-05-09 ENCOUNTER — Ambulatory Visit (INDEPENDENT_AMBULATORY_CARE_PROVIDER_SITE_OTHER): Payer: Medicaid Other | Admitting: Certified Nurse Midwife

## 2020-05-09 ENCOUNTER — Other Ambulatory Visit: Payer: Self-pay

## 2020-05-09 VITALS — BP 112/81 | HR 83 | Wt 137.6 lb

## 2020-05-09 DIAGNOSIS — Z3492 Encounter for supervision of normal pregnancy, unspecified, second trimester: Secondary | ICD-10-CM

## 2020-05-09 DIAGNOSIS — Z3A16 16 weeks gestation of pregnancy: Secondary | ICD-10-CM

## 2020-05-09 NOTE — Progress Notes (Signed)
   PRENATAL VISIT NOTE  Subjective:  Heather Malone is a 30 y.o. G2P1001 at [redacted]w[redacted]d being seen today for ongoing prenatal care.  She is currently monitored for the following issues for this low-risk pregnancy and has Supervision of low-risk pregnancy; Chronic headaches; and Nausea and vomiting in pregnancy prior to [redacted] weeks gestation on their problem list.  Patient reports no complaints, nausea has lessened considerably.  Contractions: Not present. Vag. Bleeding: None.  Movement: Present. Denies leaking of fluid.   The following portions of the patient's history were reviewed and updated as appropriate: allergies, current medications, past family history, past medical history, past social history, past surgical history and problem list.   Objective:   Vitals:   05/09/20 1506  BP: 112/81  Pulse: 83  Weight: 137 lb 9.6 oz (62.4 kg)    Fetal Status: Fetal Heart Rate (bpm): 156 Fundal Height: 16 cm Movement: Present     General:  Alert, oriented and cooperative. Patient is in no acute distress.  Skin: Skin is warm and dry. No rash noted.   Cardiovascular: Normal heart rate noted  Respiratory: Normal respiratory effort, no problems with respiration noted  Abdomen: Soft, gravid, appropriate for gestational age.  Pain/Pressure: Present     Pelvic: Cervical exam deferred        Extremities: Normal range of motion.  Edema: Trace  Mental Status: Normal mood and affect. Normal behavior. Normal judgment and thought content.   Assessment and Plan:  Pregnancy: G2P1001 at [redacted]w[redacted]d 1. Supervision of low-risk pregnancy, second trimester - Doing well, beginning to feel fetal movement  2. [redacted] weeks gestation of pregnancy - Routine OB care - Reviewed [facility] results (silent alpha thalassemia carrier), partner test kit given with instructions - Anatomy scan scheduled for next week - AFP, Serum, Open Spina Bifida  Preterm labor symptoms and general obstetric precautions including but not limited to  vaginal bleeding, contractions, leaking of fluid and fetal movement were reviewed in detail with the patient. Please refer to After Visit Summary for other counseling recommendations.   Return in about 4 weeks (around 06/06/2020) for IN-PERSON, LOB.  Future Appointments  Date Time Provider [place]  05/30/2020 10:15 AM [facility] US2 [facility]  06/06/2020  1:35 PM Gabriel Carina, CNM [facility]    Gabriel Carina, CNM

## 2020-05-11 LAB — AFP, SERUM, OPEN SPINA BIFIDA
AFP MoM: 1.47
AFP Value: 59.7 ng/mL
Gest. Age on Collection Date: 16 weeks
Maternal Age At EDD: 30.6 yr
OSBR Risk 1 IN: 5931
Test Results:: NEGATIVE
Weight: 138 [lb_av]

## 2020-05-17 ENCOUNTER — Telehealth: Payer: Self-pay | Admitting: Family Medicine

## 2020-05-17 MED ORDER — PROMETHAZINE HCL 25 MG PO TABS: 25.0000 mg | ORAL_TABLET | Freq: Four times a day (QID) | ORAL | 0 refills | Status: DC | PRN

## 2020-05-17 NOTE — Telephone Encounter (Signed)
Returned patients call. She did not answer. LM for patient to check My Chart message.   My Chart message sent.

## 2020-05-17 NOTE — Telephone Encounter (Signed)
Pt called and states she has sore throat( voice is almost gone), headache and nausea and need call back to see what she can take since she is pregnant

## 2020-05-30 ENCOUNTER — Ambulatory Visit: Payer: Medicaid Other | Attending: Obstetrics and Gynecology

## 2020-05-30 ENCOUNTER — Other Ambulatory Visit: Payer: Self-pay | Admitting: *Deleted

## 2020-05-30 ENCOUNTER — Other Ambulatory Visit: Payer: Self-pay

## 2020-05-30 DIAGNOSIS — O4442 Low lying placenta NOS or without hemorrhage, second trimester: Secondary | ICD-10-CM

## 2020-05-30 DIAGNOSIS — Z3490 Encounter for supervision of normal pregnancy, unspecified, unspecified trimester: Secondary | ICD-10-CM

## 2020-05-30 DIAGNOSIS — O4592 Premature separation of placenta, unspecified, second trimester: Secondary | ICD-10-CM

## 2020-05-30 DIAGNOSIS — O350XX0 Maternal care for (suspected) central nervous system malformation in fetus, not applicable or unspecified: Secondary | ICD-10-CM

## 2020-05-30 DIAGNOSIS — Z362 Encounter for other antenatal screening follow-up: Secondary | ICD-10-CM

## 2020-05-30 DIAGNOSIS — Z148 Genetic carrier of other disease: Secondary | ICD-10-CM

## 2020-05-30 DIAGNOSIS — Z3A19 19 weeks gestation of pregnancy: Secondary | ICD-10-CM

## 2020-05-30 DIAGNOSIS — D649 Anemia, unspecified: Secondary | ICD-10-CM

## 2020-05-30 DIAGNOSIS — O99012 Anemia complicating pregnancy, second trimester: Secondary | ICD-10-CM

## 2020-05-30 DIAGNOSIS — Z363 Encounter for antenatal screening for malformations: Secondary | ICD-10-CM

## 2020-05-31 ENCOUNTER — Telehealth: Payer: Self-pay | Admitting: Family Medicine

## 2020-05-31 MED ORDER — CYCLOBENZAPRINE HCL 10 MG PO TABS: 10.0000 mg | ORAL_TABLET | Freq: Three times a day (TID) | ORAL | 1 refills | Status: DC | PRN

## 2020-05-31 NOTE — Telephone Encounter (Signed)
I need to have a mri or something done because I'm having excessive headaches with sharp pains and I'm starting to be concerned especially with the different meds that have been provided since I've been pregnant. I believe I need to be seen prior to 5/11 for this

## 2020-05-31 NOTE — Addendum Note (Signed)
Addended by: Faythe Casa on: 05/31/2020 11:17 AM   Modules accepted: Orders

## 2020-05-31 NOTE — Telephone Encounter (Signed)
Pt reports that she is having sharp shooting pain in her temples over the past couple of days and the Butalbital is not effective.  Per Dr. Alysia Penna pt can have Flexeril 10 mg po tid PRN for headache.  Advised pt on how to take medication.  Pt verbalized understanding with no further questions.   Addison Naegeli, RN  05/31/20

## 2020-06-06 ENCOUNTER — Other Ambulatory Visit: Payer: Self-pay

## 2020-06-06 ENCOUNTER — Ambulatory Visit (INDEPENDENT_AMBULATORY_CARE_PROVIDER_SITE_OTHER): Payer: PRIVATE HEALTH INSURANCE | Admitting: Certified Nurse Midwife

## 2020-06-06 VITALS — BP 117/84 | HR 107 | Wt 147.0 lb

## 2020-06-06 DIAGNOSIS — O99019 Anemia complicating pregnancy, unspecified trimester: Secondary | ICD-10-CM

## 2020-06-06 DIAGNOSIS — R7309 Other abnormal glucose: Secondary | ICD-10-CM

## 2020-06-06 DIAGNOSIS — G4453 Primary thunderclap headache: Secondary | ICD-10-CM

## 2020-06-06 DIAGNOSIS — D509 Iron deficiency anemia, unspecified: Secondary | ICD-10-CM

## 2020-06-06 DIAGNOSIS — O219 Vomiting of pregnancy, unspecified: Secondary | ICD-10-CM

## 2020-06-06 DIAGNOSIS — Z3A20 20 weeks gestation of pregnancy: Secondary | ICD-10-CM

## 2020-06-06 DIAGNOSIS — Z3492 Encounter for supervision of normal pregnancy, unspecified, second trimester: Secondary | ICD-10-CM

## 2020-06-06 MED ORDER — POLYSACCHARIDE IRON COMPLEX 150 MG PO CAPS: 150.0000 mg | ORAL_CAPSULE | Freq: Every day | ORAL | 2 refills | Status: DC

## 2020-06-06 NOTE — Progress Notes (Signed)
   PRENATAL VISIT NOTE  Subjective:  Heather Malone is a 30 y.o. G2P1001 at [redacted]w[redacted]d being seen today for ongoing prenatal care.  She is currently monitored for the following issues for this low-risk pregnancy and has Supervision of low-risk pregnancy; Chronic headaches; and Nausea and vomiting in pregnancy prior to [redacted] weeks gestation on their problem list.  Patient reports some continued nausea/vomiting says she is down to 2 vomiting episodes per day. Only sporadically needing medication for it. Beginning to have sudden onset severe headaches that rapidly dissipate or lead to a full on headache. Otherwise feeling much better than earlier in the pregnancy.  Contractions: Not present. Vag. Bleeding: None.  Movement: Present. Denies leaking of fluid.   The following portions of the patient's history were reviewed and updated as appropriate: allergies, current medications, past family history, past medical history, past social history, past surgical history and problem list.   Objective:   Vitals:   06/06/20 1356  BP: 117/84  Pulse: (!) 107  Weight: 147 lb (66.7 kg)    Fetal Status:     Movement: Present     General:  Alert, oriented and cooperative. Patient is in no acute distress.  Skin: Skin is warm and dry. No rash noted.   Cardiovascular: Normal heart rate noted  Respiratory: Normal respiratory effort, no problems with respiration noted  Abdomen: Soft, gravid, appropriate for gestational age.  Pain/Pressure: Present     Pelvic: Cervical exam deferred        Extremities: Normal range of motion.  Edema: None  Mental Status: Normal mood and affect. Normal behavior. Normal judgment and thought content.   Assessment and Plan:  Pregnancy: G2P1001 at [redacted]w[redacted]d 1. Encounter for supervision of low-risk pregnancy in second trimester - Beginning to feel regular fetal movement  2. [redacted] weeks gestation of pregnancy - Routine OB care  3. Nausea and vomiting during pregnancy - Does not need  refill of meds, only taking occasionally. Feels current state is a great improvement.  4. Elevated hemoglobin A1c - will do GTT as soon as possible, explained we may need to repeat if this one is normal. - - Reviewed role of hyperemesis with elevated A1C.  5. Thunderclap headache - AMB referral to headache clinic  6. Iron deficiency anemia during pregnancy - iron polysaccharides (NIFEREX) 150 MG capsule; Take 1 capsule (150 mg total) by mouth daily.  Dispense: 30 capsule; Refill: 2  Preterm labor symptoms and general obstetric precautions including but not limited to vaginal bleeding, contractions, leaking of fluid and fetal movement were reviewed in detail with the patient. Please refer to After Visit Summary for other counseling recommendations.   Return in about 4 weeks (around 07/04/2020) for IN-PERSON, LOB.  Future Appointments  Date Time Provider Department Center  06/12/2020  8:50 AM [facility] LAB [facility]  06/29/2020  1:15 PM [facility] NURSE [facility]  06/29/2020  1:30 PM [facility] US3 [facility]  07/04/2020  2:15 PM Bernerd Limbo, CNM [facility]    Bernerd Limbo, CNM

## 2020-06-08 ENCOUNTER — Other Ambulatory Visit: Payer: Self-pay | Admitting: *Deleted

## 2020-06-08 DIAGNOSIS — R7309 Other abnormal glucose: Secondary | ICD-10-CM

## 2020-06-08 DIAGNOSIS — Z3492 Encounter for supervision of normal pregnancy, unspecified, second trimester: Secondary | ICD-10-CM

## 2020-06-12 ENCOUNTER — Other Ambulatory Visit: Payer: PRIVATE HEALTH INSURANCE

## 2020-06-13 ENCOUNTER — Other Ambulatory Visit: Payer: Self-pay

## 2020-06-13 ENCOUNTER — Other Ambulatory Visit: Payer: Medicaid Other

## 2020-06-13 DIAGNOSIS — R7309 Other abnormal glucose: Secondary | ICD-10-CM

## 2020-06-13 DIAGNOSIS — Z3492 Encounter for supervision of normal pregnancy, unspecified, second trimester: Secondary | ICD-10-CM

## 2020-06-14 LAB — GLUCOSE TOLERANCE, 2 HOURS W/ 1HR
Glucose, 1 hour: 113 mg/dL (ref 65–179)
Glucose, 2 hour: 109 mg/dL (ref 65–152)
Glucose, Fasting: 76 mg/dL (ref 65–91)

## 2020-06-15 ENCOUNTER — Telehealth: Payer: Self-pay | Admitting: *Deleted

## 2020-06-15 ENCOUNTER — Inpatient Hospital Stay (HOSPITAL_BASED_OUTPATIENT_CLINIC_OR_DEPARTMENT_OTHER): Payer: Medicaid Other

## 2020-06-15 ENCOUNTER — Other Ambulatory Visit: Payer: Self-pay

## 2020-06-15 ENCOUNTER — Inpatient Hospital Stay (HOSPITAL_COMMUNITY)
Admission: AD | Admit: 2020-06-15 | Discharge: 2020-06-15 | Disposition: A | Discharge status: Home / Self Care | Payer: Medicaid Other | Attending: Obstetrics & Gynecology | Admitting: Obstetrics & Gynecology

## 2020-06-15 ENCOUNTER — Inpatient Hospital Stay (HOSPITAL_COMMUNITY): Payer: Medicaid Other

## 2020-06-15 ENCOUNTER — Encounter (HOSPITAL_COMMUNITY): Payer: Self-pay | Admitting: Obstetrics & Gynecology

## 2020-06-15 DIAGNOSIS — O26892 Other specified pregnancy related conditions, second trimester: Secondary | ICD-10-CM

## 2020-06-15 DIAGNOSIS — O23592 Infection of other part of genital tract in pregnancy, second trimester: Secondary | ICD-10-CM | POA: Diagnosis not present

## 2020-06-15 DIAGNOSIS — O322XX0 Maternal care for transverse and oblique lie, not applicable or unspecified: Secondary | ICD-10-CM | POA: Diagnosis not present

## 2020-06-15 DIAGNOSIS — R109 Unspecified abdominal pain: Secondary | ICD-10-CM | POA: Insufficient documentation

## 2020-06-15 DIAGNOSIS — A599 Trichomoniasis, unspecified: Secondary | ICD-10-CM | POA: Insufficient documentation

## 2020-06-15 DIAGNOSIS — R102 Pelvic and perineal pain: Secondary | ICD-10-CM | POA: Diagnosis not present

## 2020-06-15 DIAGNOSIS — Z87891 Personal history of nicotine dependence: Secondary | ICD-10-CM | POA: Insufficient documentation

## 2020-06-15 DIAGNOSIS — Z3A21 21 weeks gestation of pregnancy: Secondary | ICD-10-CM | POA: Insufficient documentation

## 2020-06-15 DIAGNOSIS — O98313 Other infections with a predominantly sexual mode of transmission complicating pregnancy, third trimester: Secondary | ICD-10-CM | POA: Diagnosis not present

## 2020-06-15 LAB — CBC WITH DIFFERENTIAL/PLATELET
Abs Immature Granulocytes: 0.05 10*3/uL (ref 0.00–0.07)
Basophils Absolute: 0 10*3/uL (ref 0.0–0.1)
Basophils Relative: 0 %
Eosinophils Absolute: 0.2 10*3/uL (ref 0.0–0.5)
Eosinophils Relative: 2 %
HCT: 28.3 % — ABNORMAL LOW (ref 36.0–46.0)
Hemoglobin: 9.3 g/dL — ABNORMAL LOW (ref 12.0–15.0)
Immature Granulocytes: 1 %
Lymphocytes Relative: 21 %
Lymphs Abs: 1.5 10*3/uL (ref 0.7–4.0)
MCH: 28.4 pg (ref 26.0–34.0)
MCHC: 32.9 g/dL (ref 30.0–36.0)
MCV: 86.5 fL (ref 80.0–100.0)
Monocytes Absolute: 0.6 10*3/uL (ref 0.1–1.0)
Monocytes Relative: 8 %
Neutro Abs: 4.8 10*3/uL (ref 1.7–7.7)
Neutrophils Relative %: 68 %
Platelets: 266 10*3/uL (ref 150–400)
RBC: 3.27 MIL/uL — ABNORMAL LOW (ref 3.87–5.11)
RDW: 14.2 % (ref 11.5–15.5)
WBC: 7.1 10*3/uL (ref 4.0–10.5)
nRBC: 0 % (ref 0.0–0.2)

## 2020-06-15 LAB — URINALYSIS, MICROSCOPIC (REFLEX)

## 2020-06-15 LAB — WET PREP, GENITAL
Clue Cells Wet Prep HPF POC: NONE SEEN
Sperm: NONE SEEN
Yeast Wet Prep HPF POC: NONE SEEN

## 2020-06-15 LAB — URINALYSIS, ROUTINE W REFLEX MICROSCOPIC
Bilirubin Urine: NEGATIVE
Glucose, UA: NEGATIVE mg/dL
Hgb urine dipstick: NEGATIVE
Ketones, ur: NEGATIVE mg/dL
Nitrite: NEGATIVE
Protein, ur: NEGATIVE mg/dL
Specific Gravity, Urine: 1.02 (ref 1.005–1.030)
pH: 8.5 — ABNORMAL HIGH (ref 5.0–8.0)

## 2020-06-15 LAB — COMPREHENSIVE METABOLIC PANEL
ALT: 12 U/L (ref 0–44)
AST: 12 U/L — ABNORMAL LOW (ref 15–41)
Albumin: 3.1 g/dL — ABNORMAL LOW (ref 3.5–5.0)
Alkaline Phosphatase: 33 U/L — ABNORMAL LOW (ref 38–126)
Anion gap: 9 (ref 5–15)
BUN: <5 mg/dL — ABNORMAL LOW (ref 6–20)
CO2: 23 mmol/L (ref 22–32)
Calcium: 9 mg/dL (ref 8.9–10.3)
Chloride: 103 mmol/L (ref 98–111)
Creatinine, Ser: 0.53 mg/dL (ref 0.44–1.00)
GFR, Estimated: >60 mL/min (ref >60)
Glucose, Bld: 90 mg/dL (ref 70–99)
Potassium: 3.9 mmol/L (ref 3.5–5.1)
Sodium: 135 mmol/L (ref 135–145)
Total Bilirubin: 0.3 mg/dL (ref 0.3–1.2)
Total Protein: 6.5 g/dL (ref 6.5–8.1)

## 2020-06-15 MED ORDER — SIMETHICONE 80 MG PO CHEW
80.0000 mg | CHEWABLE_TABLET | Freq: Once | ORAL | Status: AC
  Administered 2020-06-15: 80 mg via ORAL
  Filled 2020-06-15: qty 1

## 2020-06-15 MED ORDER — METRONIDAZOLE 500 MG PO TABS
2000.0000 mg | ORAL_TABLET | Freq: Once | ORAL | Status: AC
  Administered 2020-06-15: 2000 mg via ORAL
  Filled 2020-06-15: qty 4

## 2020-06-15 MED ORDER — HYDROMORPHONE HCL 1 MG/ML IJ SOLN
1.0000 mg | Freq: Once | INTRAMUSCULAR | Status: AC
Start: 2020-06-15 — End: 2020-06-15
  Administered 2020-06-15: 1 mg via INTRAMUSCULAR

## 2020-06-15 MED ORDER — HYDROMORPHONE HCL 1 MG/ML IJ SOLN
1.0000 mg | Freq: Once | INTRAMUSCULAR | Status: DC
  Filled 2020-06-15: qty 1

## 2020-06-15 MED ORDER — CYCLOBENZAPRINE HCL 5 MG PO TABS
10.0000 mg | ORAL_TABLET | Freq: Once | ORAL | Status: AC
  Administered 2020-06-15: 10 mg via ORAL
  Filled 2020-06-15: qty 2

## 2020-06-15 NOTE — MAU Note (Signed)
Heather Malone is a 30 y.o. at [redacted]w[redacted]d here in MAU reporting: mid abdominal pain and pelvic pain that started within the past couple of hours. Pelvic pain is intermittent and mid abdominal pain is constant. No bleeding, LOF, or discharge.   Onset of complaint: today  Pain score: mid abdominal pain 6/10, pelvic pain 4/10  Vitals:   06/15/20 1234  BP: 122/72  Pulse: 96  Resp: 20  Temp: 98.6 F (37 C)  SpO2: 99%     FHT:142  Lab orders placed from triage: UA

## 2020-06-15 NOTE — Discharge Instructions (Signed)
Trichomoniasis Trichomoniasis is an STI (sexually transmitted infection) that can affect both women and men. In women, the outer area of the female genitalia (vulva) and the vagina are affected. In men, mainly the penis is affected, but the prostate and other reproductive organs can also be involved.  This condition can be treated with medicine. It often has no symptoms (is asymptomatic), especially in men. If not treated, trichomoniasis can last for months or years. What are the causes? This condition is caused by a parasite called Trichomonas vaginalis. Trichomoniasis most often spreads from person to person (is contagious) through sexual contact. What increases the risk? The following factors may make you more likely to develop this condition:  Having unprotected sex.  Having sex with a partner who has trichomoniasis.  Having multiple sexual partners.  Having had previous trichomoniasis infections or other STIs. What are the signs or symptoms? In women, symptoms of trichomoniasis include:  Abnormal vaginal discharge that is clear, white, gray, or yellow-green and foamy and has an unusual "fishy" odor.  Itching and irritation of the vagina and vulva.  Burning or pain during urination or sex.  Redness and swelling of the genitals. In men, symptoms of trichomoniasis include:  Penile discharge that may be foamy or contain pus.  Pain in the penis. This may happen only when urinating.  Itching or irritation inside the penis.  Burning after urination or ejaculation. How is this diagnosed? In women, this condition may be found during a routine Pap test or physical exam. It may be found in men during a routine physical exam. Your health care provider may do tests to help diagnose this infection, such as:  Urine tests (men and women).  The following in women: ? Testing the pH of the vagina. ? A vaginal swab test that checks for the Trichomonas vaginalis parasite. ? Testing vaginal  secretions. Your health care provider may test you for other STIs, including HIV (human immunodeficiency virus). How is this treated? This condition is treated with medicine taken by mouth (orally), such as metronidazole or tinidazole, to fight the infection. Your sexual partner(s) also need to be tested and treated.  If you are a woman and you plan to become pregnant or think you may be pregnant, tell your health care provider right away. Some medicines that are used to treat the infection should not be taken during pregnancy. Your health care provider may recommend over-the-counter medicines or creams to help relieve itching or irritation. You may be tested for infection again 3 months after treatment.   Follow these instructions at home:  Take and use over-the-counter and prescription medicines, including creams, only as told by your health care provider.  Take your antibiotic medicine as told by your health care provider. Do not stop taking the antibiotic even if you start to feel better.  Do not have sex until 7-10 days after you finish your medicine, or until your health care provider approves. Ask your health care provider when you may start to have sex again.  (Women) Do not douche or wear tampons while you have the infection.  Discuss your infection with your sexual partner(s). Make sure that your partner gets tested and treated, if necessary.  Keep all follow-up visits as told by your health care provider. This is important. How is this prevented?  Use condoms every time you have sex. Using condoms correctly and consistently can help protect against STIs.  Avoid having multiple sexual partners.  Talk with your sexual partner about   any symptoms that either of you may have, as well as any history of STIs.  Get tested for STIs and STDs (sexually transmitted diseases) before you have sex. Ask your partner to do the same.  Do not have sexual contact if you have symptoms of  trichomoniasis or another STI.   Contact a health care provider if:  You still have symptoms after you finish your medicine.  You develop pain in your abdomen.  You have pain when you urinate.  You have bleeding after sex.  You develop a rash.  You feel nauseous or you vomit.  You plan to become pregnant or think you may be pregnant. Summary  Trichomoniasis is an STI (sexually transmitted infection) that can affect both women and men.  This condition often has no symptoms (is asymptomatic), especially in men.  Without treatment, this condition can last for months or years.  You should not have sex until 7-10 days after you finish your medicine, or until your health care provider approves. Ask your health care provider when you may start to have sex again.  Discuss your infection with your sexual partner(s). Make sure that your partner gets tested and treated, if necessary. This information is not intended to replace advice given to you by your health care provider. Make sure you discuss any questions you have with your health care provider. Document Revised: 10/27/2017 Document Reviewed: 10/27/2017 Elsevier Patient Education  2021 Elsevier Inc.  

## 2020-06-15 NOTE — MAU Provider Note (Signed)
Chief Complaint:  Abdominal Pain and Pelvic Pain   Event Date/Time   First Provider Initiated Contact with Patient 06/15/20 1317     HPI: Heather Malone is a 30 y.o. G2P1001 at [redacted]w[redacted]d who presents to maternity admissions reporting intense abdominal pain (mid to lower abd) that set in this morning. Reports cramping/pulling sensation. Denies vaginal bleeding, increased/abnormal vaginal discharge, leaking of fluid, decreased fetal movement, fever, falls, or recent illness.   Pregnancy Course: Receives care at [facility]. Prenatal records reviewed. Battled HG in first trimester, [facility] and trichomonas (+ in Feb, then - at first prenatal visit two months ago)  Past Medical History:  Diagnosis Date  . Anemia   . Anxiety   . Asthma    albuterol prn  . Headache    migraines  . Hx of trichomoniasis   . Hx of varicella   . Neck injury, subsequent encounter 07/26/2016  . Placental abruption in third trimester 09/04/2014  . Subchorionic hemorrhage in third trimester 07/26/2014  . Velamentous insertion of umbilical cord    OB History  Gravida Para Term Preterm AB Living  2 1 1     1   SAB IAB Ectopic Multiple Live Births        0 1    # Outcome Date GA Lbr Len/2nd Weight Sex Delivery Anes PTL Lv  2 Current           1 Term 09/05/14 [redacted]w[redacted]d 1578:09 / 00:10 7 lb 5.8 oz (3.34 kg) M Vag-Spont None  LIV   Past Surgical History:  Procedure Laterality Date  . TONSILLECTOMY     Family History  Problem Relation Age of Onset  . Hypertension Mother   . Hypertension Maternal Grandmother   . Heart attack Maternal Grandmother   . Hypertension Maternal Grandfather   . Cancer Maternal Grandfather   . Diabetes Paternal Grandmother    Social History   Tobacco Use  . Smoking status: Former Smoker    Types: Cigars  . Smokeless tobacco: Never Used  Substance Use Topics  . Alcohol use: Not Currently  . Drug use: Not Currently    Types: Marijuana    Comment: none with + UPT   No Known Allergies No  medications prior to admission.   I have reviewed patient's Past Medical Hx, Surgical Hx, Family Hx, Social Hx, medications and allergies.   ROS:  Pertinent items noted in HPI and remainder of comprehensive ROS otherwise negative.  Physical Exam   06/15/20 2015 -- 90 -- 18 115/77 -- -- KW  06/15/20 1738 -- 79 -- -- 113/74 -- -- JS  06/15/20 1234 98.6 F (37 C) 96 -- 20 122/72 Sitting 99 %    Constitutional: Well-developed, well-nourished female in mild physical distress.  Cardiovascular: normal rate & rhythm, no murmur Respiratory: normal effort, lung sounds clear throughout GI: Abd soft, tender especially from navel to below, no masses, no guarding, gravid appropriate for gestational age. Pos BS x 4 MS: Extremities nontender, no edema, normal ROM Neurologic: Alert and oriented x 4.  GU: no CVA tenderness Pelvic: NEFG, copious yellow discharge, no blood, swabs collected  Dilation: Closed Effacement (%): Thick Cervical Position: Posterior Exam by:: [email or web address], CNM  FHR: 142   Labs: Labs normal except for anemia (will address at [facility]) Wet prep positive for trichomonas,  Imaging:  Abdominal and OB limited U/S normal  MAU Course: Orders Placed This Encounter  Procedures  . Wet prep, genital  . Edd Arbour Abdomen Complete  . [place]  MFM OB LIMITED  . Urinalysis, Routine w reflex microscopic Urine, Clean Catch  . CBC with Differential/Platelet  . Comprehensive metabolic panel  . Urinalysis, Microscopic (reflex)  . Discharge patient   Meds ordered this encounter  Medications  . DISCONTD: HYDROmorphone (DILAUDID) injection 1 mg  . HYDROmorphone (DILAUDID) injection 1 mg  . simethicone (MYLICON) chewable tablet 80 mg  . cyclobenzaprine (FLEXERIL) tablet 10 mg  . metroNIDAZOLE (FLAGYL) tablet 2,000 mg    MDM: Very temporary relief from dilaudid prior to abdominal ultrasound, but returned quickly. Given simethicone afterward which did not help at all. Sent for OB limited  ultrasound. Good relief from flexeril.   Informed patient of results and encouraged her to get partner treated again - partner prescription given. All questions answered and encouragement given.  Assessment: 1. Trichomonas infection   2. Abdominal pain   3. Abdominal pain during pregnancy in second trimester     Plan: Discharge home in stable condition with second trimester precautions.    Follow-up Information    [facility] Follow up.   Specialty: Obstetrics and Gynecology Why: as scheduled for ongoing prenatal care Contact information: [address] [place] [address] [phone]              Allergies as of 06/15/2020   No Known Allergies     Medication List    TAKE these medications   Butalbital-APAP-Caffeine 50-325-40 MG capsule Take 1-2 capsules by mouth every 6 (six) hours as needed for headache.   cyclobenzaprine 10 MG tablet Commonly known as: FLEXERIL Take 1 tablet (10 mg total) by mouth 3 (three) times daily as needed (headache).   iron polysaccharides 150 MG capsule Commonly known as: NIFEREX Take 1 capsule (150 mg total) by mouth daily.   prenatal multivitamin Tabs tablet Take 1 tablet by mouth daily at 12 noon.   promethazine 25 MG tablet Commonly known as: PHENERGAN Take 1 tablet (25 mg total) by mouth every 6 (six) hours as needed for nausea or vomiting.      Edd Arbour, CNM, MSN, IBCLC Certified Nurse Midwife, [facility]

## 2020-06-15 NOTE — Telephone Encounter (Signed)
Pt left VM message stating that she is having pain @ her navel. The area is sore to the touch with movement and she has sharp pain. I called pt back and she stated that the pain started just before she called the office - about one hour ago. The pain has not stopped or changed. I instructed pt to go to MAU @ [facility] for evaluation. She voiced understanding and agreed to plan of care.

## 2020-06-18 LAB — GC/CHLAMYDIA PROBE AMP (~~LOC~~) NOT AT ARMC
Chlamydia: NEGATIVE
Comment: NEGATIVE
Comment: NORMAL
Neisseria Gonorrhea: NEGATIVE

## 2020-06-28 ENCOUNTER — Ambulatory Visit: Payer: PRIVATE HEALTH INSURANCE

## 2020-06-29 ENCOUNTER — Ambulatory Visit (HOSPITAL_BASED_OUTPATIENT_CLINIC_OR_DEPARTMENT_OTHER): Payer: Medicaid Other

## 2020-06-29 ENCOUNTER — Ambulatory Visit: Payer: Medicaid Other | Attending: Obstetrics and Gynecology | Admitting: *Deleted

## 2020-06-29 ENCOUNTER — Encounter: Payer: Self-pay | Admitting: *Deleted

## 2020-06-29 ENCOUNTER — Other Ambulatory Visit: Payer: Self-pay

## 2020-06-29 VITALS — BP 111/74 | HR 81

## 2020-06-29 DIAGNOSIS — O350XX0 Maternal care for (suspected) central nervous system malformation in fetus, not applicable or unspecified: Secondary | ICD-10-CM | POA: Diagnosis not present

## 2020-06-29 DIAGNOSIS — O4590 Premature separation of placenta, unspecified, unspecified trimester: Secondary | ICD-10-CM | POA: Diagnosis not present

## 2020-06-29 DIAGNOSIS — Z3A23 23 weeks gestation of pregnancy: Secondary | ICD-10-CM

## 2020-06-29 DIAGNOSIS — Z362 Encounter for other antenatal screening follow-up: Secondary | ICD-10-CM | POA: Diagnosis present

## 2020-06-29 DIAGNOSIS — R102 Pelvic and perineal pain: Secondary | ICD-10-CM | POA: Insufficient documentation

## 2020-06-29 DIAGNOSIS — O99012 Anemia complicating pregnancy, second trimester: Secondary | ICD-10-CM

## 2020-06-29 DIAGNOSIS — O321XX0 Maternal care for breech presentation, not applicable or unspecified: Secondary | ICD-10-CM

## 2020-06-29 DIAGNOSIS — Z148 Genetic carrier of other disease: Secondary | ICD-10-CM | POA: Diagnosis not present

## 2020-06-29 DIAGNOSIS — D649 Anemia, unspecified: Secondary | ICD-10-CM

## 2020-06-29 DIAGNOSIS — O26892 Other specified pregnancy related conditions, second trimester: Secondary | ICD-10-CM | POA: Insufficient documentation

## 2020-06-29 DIAGNOSIS — Z363 Encounter for antenatal screening for malformations: Secondary | ICD-10-CM

## 2020-06-29 DIAGNOSIS — O4592 Premature separation of placenta, unspecified, second trimester: Secondary | ICD-10-CM

## 2020-07-02 ENCOUNTER — Other Ambulatory Visit: Payer: Self-pay | Admitting: *Deleted

## 2020-07-02 DIAGNOSIS — Z362 Encounter for other antenatal screening follow-up: Secondary | ICD-10-CM

## 2020-07-04 ENCOUNTER — Other Ambulatory Visit: Payer: Self-pay

## 2020-07-04 ENCOUNTER — Encounter: Payer: Self-pay | Admitting: Certified Nurse Midwife

## 2020-07-04 ENCOUNTER — Other Ambulatory Visit (HOSPITAL_COMMUNITY)
Admission: RE | Admit: 2020-07-04 | Discharge: 2020-07-04 | Disposition: A | Discharge status: Home / Self Care | Payer: Medicaid Other | Source: Ambulatory Visit | Attending: Certified Nurse Midwife | Admitting: Certified Nurse Midwife

## 2020-07-04 ENCOUNTER — Ambulatory Visit (INDEPENDENT_AMBULATORY_CARE_PROVIDER_SITE_OTHER): Payer: Medicaid Other | Admitting: Certified Nurse Midwife

## 2020-07-04 VITALS — BP 97/77 | HR 97 | Wt 149.0 lb

## 2020-07-04 DIAGNOSIS — Z3492 Encounter for supervision of normal pregnancy, unspecified, second trimester: Secondary | ICD-10-CM | POA: Insufficient documentation

## 2020-07-04 DIAGNOSIS — Z3A24 24 weeks gestation of pregnancy: Secondary | ICD-10-CM

## 2020-07-04 DIAGNOSIS — N898 Other specified noninflammatory disorders of vagina: Secondary | ICD-10-CM

## 2020-07-04 LAB — OB RESULTS CONSOLE GC/CHLAMYDIA: Gonorrhea: NEGATIVE

## 2020-07-05 ENCOUNTER — Ambulatory Visit: Payer: Medicaid Other | Attending: Internal Medicine

## 2020-07-05 DIAGNOSIS — A599 Trichomoniasis, unspecified: Secondary | ICD-10-CM

## 2020-07-05 DIAGNOSIS — Z20822 Contact with and (suspected) exposure to covid-19: Secondary | ICD-10-CM

## 2020-07-05 LAB — CERVICOVAGINAL ANCILLARY ONLY
Bacterial Vaginitis (gardnerella): NEGATIVE
Candida Glabrata: NEGATIVE
Candida Vaginitis: NEGATIVE
Chlamydia: NEGATIVE
Comment: NEGATIVE
Comment: NEGATIVE
Comment: NEGATIVE
Comment: NEGATIVE
Comment: NEGATIVE
Comment: NORMAL
Neisseria Gonorrhea: NEGATIVE
Trichomonas: POSITIVE — AB

## 2020-07-06 ENCOUNTER — Telehealth: Payer: Self-pay

## 2020-07-06 LAB — NOVEL CORONAVIRUS, NAA: SARS-CoV-2, NAA: DETECTED — AB

## 2020-07-06 LAB — SARS-COV-2, NAA 2 DAY TAT

## 2020-07-06 NOTE — Telephone Encounter (Signed)
See lab note- patient notified 

## 2020-07-06 NOTE — Progress Notes (Signed)
   PRENATAL VISIT NOTE  Subjective:  Heather Malone is a 29 y.o. G2P1001 at [redacted]w[redacted]d being seen today for ongoing prenatal care.  She is currently monitored for the following issues for this low-risk pregnancy and has Supervision of low-risk pregnancy; Chronic headaches; and Nausea and vomiting in pregnancy prior to [redacted] weeks gestation on their problem list.  Patient reports vaginal irritation and discharge .  Contractions: Not present. Vag. Bleeding: None.  Movement: Present. Denies leaking of fluid.   The following portions of the patient's history were reviewed and updated as appropriate: allergies, current medications, past family history, past medical history, past social history, past surgical history and problem list.   Objective:   Vitals:   07/04/20 1431  BP: 97/77  Pulse: 97  Weight: 149 lb (67.6 kg)    Fetal Status: Fetal Heart Rate (bpm): 145 Fundal Height: 24 cm Movement: Present     General:  Alert, oriented and cooperative. Patient is in no acute distress.  Skin: Skin is warm and dry. No rash noted.   Cardiovascular: Normal heart rate noted  Respiratory: Normal respiratory effort, no problems with respiration noted  Abdomen: Soft, gravid, appropriate for gestational age.  Pain/Pressure: Present     Pelvic: Self-swab obtained, pelvic exam deferred         Extremities: Normal range of motion.  Edema: Trace  Mental Status: Normal mood and affect. Normal behavior. Normal judgment and thought content.   Assessment and Plan:  Pregnancy: G2P1001 at [redacted]w[redacted]d 1. Encounter for supervision of low-risk pregnancy in second trimester - Doing well, feeling regular and vigorous fetal movement  2. [redacted] weeks gestation of pregnancy - Routine OB care  3. Vaginal discharge - Cervicovaginal ancillary only( [place])  Preterm labor symptoms and general obstetric precautions including but not limited to vaginal bleeding, contractions, leaking of fluid and fetal movement were reviewed in  detail with the patient. Please refer to After Visit Summary for other counseling recommendations.   Return in about 4 weeks (around 08/01/2020) for IN-PERSON, LOB w GTT.  Future Appointments  Date Time Provider Department Center  07/18/2020  3:55 PM [place]borne Oman [facility]  07/27/2020  1:30 PM [facility] NURSE [facility]  07/27/2020  1:45 PM [facility] US5 [facility]  08/01/2020 10:15 AM [place] Edwena Blow, PA-C [facility]    Bernerd Limbo, CNM

## 2020-07-06 NOTE — Telephone Encounter (Signed)
Pt is calling back for her COVID test results. Please advise CB- [phone]

## 2020-07-09 MED ORDER — FAMOTIDINE 20 MG PO TABS: 20.0000 mg | ORAL_TABLET | Freq: Two times a day (BID) | ORAL | 1 refills | Status: DC

## 2020-07-09 MED ORDER — METRONIDAZOLE 500 MG PO TABS: 500.0000 mg | ORAL_TABLET | Freq: Two times a day (BID) | ORAL | 1 refills | Status: DC

## 2020-07-09 MED ORDER — OMEPRAZOLE 20 MG PO CPDR: 20.0000 mg | DELAYED_RELEASE_CAPSULE | Freq: Every day | ORAL | 2 refills | Status: DC

## 2020-07-10 ENCOUNTER — Telehealth: Payer: Self-pay

## 2020-07-10 ENCOUNTER — Telehealth: Payer: Self-pay | Admitting: General Practice

## 2020-07-10 NOTE — Telephone Encounter (Signed)
FETAL ECHO SCHEDULED FOR 07/13/2020.
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
## 2020-07-18 ENCOUNTER — Other Ambulatory Visit: Payer: Self-pay

## 2020-07-18 ENCOUNTER — Other Ambulatory Visit (HOSPITAL_COMMUNITY)
Admission: RE | Admit: 2020-07-18 | Discharge: 2020-07-18 | Disposition: A | Discharge status: Home / Self Care | Payer: Medicaid Other | Source: Ambulatory Visit | Attending: Certified Nurse Midwife | Admitting: Certified Nurse Midwife

## 2020-07-18 ENCOUNTER — Ambulatory Visit (INDEPENDENT_AMBULATORY_CARE_PROVIDER_SITE_OTHER): Payer: Medicaid Other | Admitting: Certified Nurse Midwife

## 2020-07-18 VITALS — BP 110/82 | HR 75 | Wt 152.2 lb

## 2020-07-18 DIAGNOSIS — Z113 Encounter for screening for infections with a predominantly sexual mode of transmission: Secondary | ICD-10-CM | POA: Insufficient documentation

## 2020-07-18 DIAGNOSIS — Z3492 Encounter for supervision of normal pregnancy, unspecified, second trimester: Secondary | ICD-10-CM

## 2020-07-18 DIAGNOSIS — Z3A26 26 weeks gestation of pregnancy: Secondary | ICD-10-CM

## 2020-07-18 NOTE — Progress Notes (Signed)
   PRENATAL VISIT NOTE  Subjective:  Heather Malone is a 30 y.o. G2P1001 at [redacted]w[redacted]d being seen today for ongoing prenatal care.  She is currently monitored for the following issues for this low-risk pregnancy and has Supervision of low-risk pregnancy; Chronic headaches; and Nausea and vomiting in pregnancy prior to [redacted] weeks gestation on their problem list.  Patient reports  nausea is better since starting Prilosec. Has no other complaints  .  Contractions: Not present. Vag. Bleeding: None.  Movement: Present. Denies leaking of fluid.   The following portions of the patient's history were reviewed and updated as appropriate: allergies, current medications, past family history, past medical history, past social history, past surgical history and problem list.   Objective:   Vitals:   07/18/20 1626  BP: 110/82  Pulse: 75  Weight: 69 kg    Fetal Status: Fetal Heart Rate (bpm): 140 Fundal Height: 26 cm Movement: Present     General:  Alert, oriented and cooperative. Patient is in no acute distress.  Skin: Skin is warm and dry. No rash noted.   Cardiovascular: Normal heart rate noted  Respiratory: Normal respiratory effort, no problems with respiration noted  Abdomen: Soft, gravid, appropriate for gestational age.  Pain/Pressure: Present     Pelvic: Cervical exam deferred        Extremities: Normal range of motion.  Edema: None  Mental Status: Normal mood and affect. Normal behavior. Normal judgment and thought content.   Assessment and Plan:  Pregnancy: G2P1001 at [redacted]w[redacted]d 1. Encounter for supervision of low-risk pregnancy in second trimester -Bera is doing well.  - Continue Prilosec PRN for heartburn  - Expecting baby girl "Naryiah"   2. [redacted] weeks gestation of pregnancy -Return in 2 weeks for ROB with GTTS.  - Discussed R/B of Waterbirth and requirement of WB class. Consents signed and scanned into chart.   3. Screening for Sexually Transmitted Disease  - Test of cure collected  today. Will F/u with patient in MyChart of results.   Preterm labor symptoms and general obstetric precautions including but not limited to vaginal bleeding, contractions, leaking of fluid and fetal movement were reviewed in detail with the patient. Please refer to After Visit Summary for other counseling recommendations.   Return in about 2 weeks (around 08/01/2020) for LROB w GTT.  Future Appointments  Date Time Provider Department Center  07/27/2020  1:30 PM [facility] NURSE [facility]  07/27/2020  1:45 PM [facility] US5 [facility]  08/01/2020 10:15 AM [place] Edwena Blow, PA-C [facility]    Signed:  Elnoria Howard) Suzie Portela, BSN, RNC-OB  Student Nurse-Midwife   07/18/2020  5:18 PM

## 2020-07-20 LAB — CERVICOVAGINAL ANCILLARY ONLY
Comment: NEGATIVE
Trichomonas: POSITIVE — AB

## 2020-07-24 ENCOUNTER — Telehealth: Payer: Self-pay

## 2020-07-24 NOTE — Telephone Encounter (Signed)
Pt left VM on nurse line requesting a call to discuss results from 07/18/20. Called pt. Discussed positive trichomonas result. Pt states she already has antibiotic at home. Pt tested positively previously but requested we retest to be sure the infection was still present prior to taking Flagyl course. Pt agrees to start Flagyl antibiotic tonight and take completely. Will retest following completion.

## 2020-07-26 ENCOUNTER — Encounter: Payer: Self-pay | Admitting: *Deleted

## 2020-07-27 ENCOUNTER — Ambulatory Visit: Payer: Medicaid Other | Attending: Obstetrics

## 2020-07-27 ENCOUNTER — Ambulatory Visit: Payer: Medicaid Other | Admitting: *Deleted

## 2020-07-27 ENCOUNTER — Other Ambulatory Visit: Payer: Self-pay

## 2020-07-27 ENCOUNTER — Encounter: Payer: Self-pay | Admitting: *Deleted

## 2020-07-27 VITALS — BP 115/77 | HR 73

## 2020-07-27 DIAGNOSIS — Z3492 Encounter for supervision of normal pregnancy, unspecified, second trimester: Secondary | ICD-10-CM | POA: Diagnosis present

## 2020-07-27 DIAGNOSIS — Z362 Encounter for other antenatal screening follow-up: Secondary | ICD-10-CM | POA: Diagnosis present

## 2020-08-01 ENCOUNTER — Inpatient Hospital Stay (HOSPITAL_COMMUNITY)
Admission: AD | Admit: 2020-08-01 | Discharge: 2020-08-01 | Disposition: A | Discharge status: Home / Self Care | Payer: Medicaid Other | Attending: Obstetrics & Gynecology | Admitting: Obstetrics & Gynecology

## 2020-08-01 ENCOUNTER — Institutional Professional Consult (permissible substitution): Payer: Medicaid Other | Admitting: Physician Assistant

## 2020-08-01 ENCOUNTER — Encounter (HOSPITAL_COMMUNITY): Payer: Self-pay | Admitting: Obstetrics & Gynecology

## 2020-08-01 ENCOUNTER — Other Ambulatory Visit: Payer: Self-pay

## 2020-08-01 DIAGNOSIS — R109 Unspecified abdominal pain: Secondary | ICD-10-CM | POA: Diagnosis not present

## 2020-08-01 DIAGNOSIS — Z87891 Personal history of nicotine dependence: Secondary | ICD-10-CM | POA: Insufficient documentation

## 2020-08-01 DIAGNOSIS — M7918 Myalgia, other site: Secondary | ICD-10-CM

## 2020-08-01 DIAGNOSIS — Z3A28 28 weeks gestation of pregnancy: Secondary | ICD-10-CM | POA: Insufficient documentation

## 2020-08-01 DIAGNOSIS — O26893 Other specified pregnancy related conditions, third trimester: Secondary | ICD-10-CM | POA: Diagnosis not present

## 2020-08-01 DIAGNOSIS — R102 Pelvic and perineal pain: Secondary | ICD-10-CM | POA: Diagnosis present

## 2020-08-01 DIAGNOSIS — Z3689 Encounter for other specified antenatal screening: Secondary | ICD-10-CM

## 2020-08-01 DIAGNOSIS — O99891 Other specified diseases and conditions complicating pregnancy: Secondary | ICD-10-CM | POA: Insufficient documentation

## 2020-08-01 LAB — URINALYSIS, ROUTINE W REFLEX MICROSCOPIC
Bilirubin Urine: NEGATIVE
Glucose, UA: NEGATIVE mg/dL
Hgb urine dipstick: NEGATIVE
Ketones, ur: NEGATIVE mg/dL
Nitrite: NEGATIVE
Protein, ur: NEGATIVE mg/dL
Specific Gravity, Urine: 1.006 (ref 1.005–1.030)
pH: 7 (ref 5.0–8.0)

## 2020-08-01 LAB — WET PREP, GENITAL
Clue Cells Wet Prep HPF POC: NONE SEEN
Sperm: NONE SEEN
Trich, Wet Prep: NONE SEEN
Yeast Wet Prep HPF POC: NONE SEEN

## 2020-08-01 LAB — FETAL FIBRONECTIN: Fetal Fibronectin: NEGATIVE

## 2020-08-01 MED ORDER — NIFEDIPINE 10 MG PO CAPS
10.0000 mg | ORAL_CAPSULE | ORAL | Status: DC | PRN
  Administered 2020-08-01 (×2): 10 mg via ORAL
  Filled 2020-08-01 (×2): qty 1

## 2020-08-01 MED ORDER — CYCLOBENZAPRINE HCL 5 MG PO TABS
10.0000 mg | ORAL_TABLET | Freq: Three times a day (TID) | ORAL | Status: DC | PRN
  Administered 2020-08-01: 10 mg via ORAL
  Filled 2020-08-01: qty 2

## 2020-08-01 MED ORDER — HYOSCYAMINE SULFATE 0.125 MG SL SUBL
0.1250 mg | SUBLINGUAL_TABLET | Freq: Once | SUBLINGUAL | Status: AC
  Administered 2020-08-01: 0.125 mg via SUBLINGUAL
  Filled 2020-08-01: qty 1

## 2020-08-01 MED ORDER — ACETAMINOPHEN 500 MG PO TABS
1000.0000 mg | ORAL_TABLET | Freq: Four times a day (QID) | ORAL | Status: DC | PRN
  Administered 2020-08-01: 1000 mg via ORAL
  Filled 2020-08-01: qty 2

## 2020-08-01 NOTE — MAU Note (Signed)
About 2100 started having some shape pain in lower abd and vagina. THen pain started feeling like cramping and pressure in lower abd. Has continued all night and was just hoping pain would go away. Denies LOF or VB

## 2020-08-01 NOTE — MAU Provider Note (Addendum)
Chief Complaint:  Abdominal Pain   Event Date/Time   First Provider Initiated Contact with Patient 08/01/20 [phone]     HPI: Heather Malone is a 30 y.o. G2P1001 at 79w0dwho presents to maternity admissions reporting sharp cramps in pelvis all day and night.  Tried to wait and see if it would improve, but it did not   She reports good fetal movement, denies LOF, vaginal bleeding, urinary symptoms, h/a, dizziness, n/v, diarrhea, constipation or fever/chills.   Has taken all but 6 of Flagyl. States RN told her she could take one per day due to nausea.  FOB states he took all 4 of his tablets.   Abdominal Pain This is a new problem. The current episode started today. The problem occurs intermittently. The problem has been unchanged. The pain is located in the LLQ, RLQ and suprapubic region. Associated symptoms include nausea. Pertinent negatives include no constipation, diarrhea, dysuria, fever, headaches, myalgias or vomiting. Nothing aggravates the pain. The pain is relieved by Nothing. She has tried nothing for the symptoms.   RN Note: About 2100 started having some shape pain in lower abd and vagina. THen pain started feeling like cramping and pressure in lower abd. Has continued all night and was just hoping pain would go away. Denies LOF or VB  Past Medical History: Past Medical History:  Diagnosis Date   Anemia    Anxiety    Asthma    albuterol prn   Headache    migraines   Hx of trichomoniasis    Hx of varicella    Neck injury, subsequent encounter 07/26/2016   Placental abruption in third trimester 09/04/2014   Subchorionic hemorrhage in third trimester 07/26/2014   Velamentous insertion of umbilical cord     Past obstetric history: OB History  Gravida Para Term Preterm AB Living  2 1 1  0 0 1  SAB IAB Ectopic Multiple Live Births  0 0 0 0 1    # Outcome Date GA Lbr Len/2nd Weight Sex Delivery Anes PTL Lv  2 Current           1 Term 09/05/14 [redacted]w[redacted]d 1578:09 / 00:10 3340 g M  Vag-Spont None  LIV    Past Surgical History: Past Surgical History:  Procedure Laterality Date   TONSILLECTOMY      Family History: Family History  Problem Relation Age of Onset   Hypertension Mother    Hypertension Maternal Grandmother    Heart attack Maternal Grandmother    Hypertension Maternal Grandfather    Cancer Maternal Grandfather    Diabetes Paternal Grandmother     Social History: Social History   Tobacco Use   Smoking status: Former    Pack years: 0.00    Types: Cigars   Smokeless tobacco: Never  Vaping Use   Vaping Use: Never used  Substance Use Topics   Alcohol use: Not Currently   Drug use: Not Currently    Types: Marijuana    Comment: none with + UPT    Allergies: No Known Allergies  Meds:  No medications prior to admission.    I have reviewed patient's Past Medical Hx, Surgical Hx, Family Hx, Social Hx, medications and allergies.   ROS:  Review of Systems  Constitutional:  Negative for fever.  Gastrointestinal:  Positive for abdominal pain and nausea. Negative for constipation, diarrhea and vomiting.  Genitourinary:  Negative for dysuria.  Musculoskeletal:  Negative for myalgias.  Neurological:  Negative for headaches.  Other systems negative  Physical  Exam  Patient Vitals for the past 24 hrs:  BP Temp Pulse Resp Height Weight  08/01/20 0859 106/72 -- 97 -- -- --  08/01/20 0731 106/67 -- 94 -- -- --  08/01/20 0713 105/69 -- 89 -- -- --  08/01/20 0652 110/74 -- -- -- -- --  08/01/20 0605 115/79 -- 85 -- -- --  08/01/20 0604 -- 97.9 F (36.6 C) -- 20 5\' 2"  (1.575 m) 68.9 kg   Constitutional: Well-developed, well-nourished female in no acute distress.  Cardiovascular: normal rate and rhythm Respiratory: normal effort, clear to auscultation bilaterally GI: Abd soft, non-tender, gravid appropriate for gestational age.   No rebound or guarding. MS: Extremities nontender, no edema, normal ROM Neurologic: Alert and oriented x 4.  GU:  Neg CVAT.  PELVIC EXAM:  Dilation: Closed Effacement (%): 30 Cervical Position: Posterior Station: Ballotable Presentation: Undeterminable Exam by:: [email or web address], CNMDilation: Closed Effacement (%): 30 Cervical Position: Posterior Station: Ballotable Presentation: Undeterminable Exam by:: [email or web address], CNM  FHT:  Baseline 140 , moderate variability, accelerations present, no decelerations Contractions: uterine irritability   Labs: Results for orders placed or performed during the hospital encounter of 08/01/20 (from the past 24 hour(s))  Urinalysis, Routine w reflex microscopic Urine, Clean Catch     Status: Abnormal   Collection Time: 08/01/20  6:15 AM  Result Value Ref Range   Color, Urine STRAW (A) YELLOW   APPearance CLEAR CLEAR   Specific Gravity, Urine 1.006 1.005 - 1.030   pH 7.0 5.0 - 8.0   Glucose, UA NEGATIVE NEGATIVE mg/dL   Hgb urine dipstick NEGATIVE NEGATIVE   Bilirubin Urine NEGATIVE NEGATIVE   Ketones, ur NEGATIVE NEGATIVE mg/dL   Protein, ur NEGATIVE NEGATIVE mg/dL   Nitrite NEGATIVE NEGATIVE   Leukocytes,Ua TRACE (A) NEGATIVE   RBC / HPF 0-5 0 - 5 RBC/hpf   WBC, UA 0-5 0 - 5 WBC/hpf   Bacteria, UA RARE (A) NONE SEEN   Squamous Epithelial / LPF 6-10 0 - 5   Mucus PRESENT   Fetal fibronectin     Status: None   Collection Time: 08/01/20  6:41 AM  Result Value Ref Range   Fetal Fibronectin NEGATIVE NEGATIVE  Wet prep, genital     Status: Abnormal   Collection Time: 08/01/20  6:41 AM  Result Value Ref Range   Yeast Wet Prep HPF POC NONE SEEN NONE SEEN   Trich, Wet Prep NONE SEEN NONE SEEN   Clue Cells Wet Prep HPF POC NONE SEEN NONE SEEN   WBC, Wet Prep HPF POC MANY (A) NONE SEEN   Sperm NONE SEEN     B/Positive/-- (03/17 1502)  Imaging:    MAU Course/MDM: I have ordered labs and reviewed results. Test of cure done for Trich.which is negative. FFn is negative also.  Urine does not show traits of infection NST reviewed, reassuring  Treatments in  MAU included Procardia x 2 doses which appear to have diminished irritability but patient states pain is the same.  Will try a dose of Levsin, in case this is intestinal cramping.  Uterine irritability with negative Fetal Fibronectin Test of cure negative for Trichomonas Care turned over to oncoming provider. 04-21-1981 CNM, MSN Certified Nurse-Midwife 08/01/20 0800  0845 reassessment: pain modestly improved 5/10, no uterine activity on toco, cervix unchanged. Describes as constant pressure in lower abdomen, no longer sharp. Suspect MSK etiology. Will offer analgesic and muscle relaxer. Stable for discharge home.  Assessment: 1. [redacted] weeks gestation of pregnancy  2. NST (non-stress test) reactive   3. Musculoskeletal pain    Plan: Discharge home Follow up at [facility] as scheduled PTL precautions  Allergies as of 08/01/2020   No Known Allergies      Medication List     TAKE these medications    Butalbital-APAP-Caffeine 50-325-40 MG capsule Take 1-2 capsules by mouth every 6 (six) hours as needed for headache.   cyclobenzaprine 10 MG tablet Commonly known as: FLEXERIL Take 1 tablet (10 mg total) by mouth 3 (three) times daily as needed (headache).   iron polysaccharides 150 MG capsule Commonly known as: NIFEREX Take 1 capsule (150 mg total) by mouth daily.   metroNIDAZOLE 500 MG tablet Commonly known as: FLAGYL Take 1 tablet (500 mg total) by mouth 2 (two) times daily.   omeprazole 20 MG capsule Commonly known as: PRILOSEC Take 1 capsule (20 mg total) by mouth daily.   prenatal multivitamin Tabs tablet Take 1 tablet by mouth daily at 12 noon.       Donette Larry, CNM  08/01/2020 9:57 AM

## 2020-08-02 ENCOUNTER — Other Ambulatory Visit: Payer: Medicaid Other

## 2020-08-02 ENCOUNTER — Ambulatory Visit (INDEPENDENT_AMBULATORY_CARE_PROVIDER_SITE_OTHER): Payer: Medicaid Other | Admitting: Women's Health

## 2020-08-02 ENCOUNTER — Other Ambulatory Visit: Payer: Self-pay

## 2020-08-02 VITALS — BP 113/78 | HR 78 | Wt 155.8 lb

## 2020-08-02 DIAGNOSIS — Z3493 Encounter for supervision of normal pregnancy, unspecified, third trimester: Secondary | ICD-10-CM

## 2020-08-02 DIAGNOSIS — Z3A28 28 weeks gestation of pregnancy: Secondary | ICD-10-CM

## 2020-08-02 DIAGNOSIS — D563 Thalassemia minor: Secondary | ICD-10-CM

## 2020-08-02 DIAGNOSIS — N858 Other specified noninflammatory disorders of uterus: Secondary | ICD-10-CM

## 2020-08-02 NOTE — Addendum Note (Signed)
Addended by: Donia Ast E on: 08/02/2020 10:12 AM   Modules accepted: Orders

## 2020-08-02 NOTE — Patient Instructions (Addendum)
Maternity Assessment Unit (MAU)  The Maternity Assessment Unit (MAU) is located at the [facility] and [place] at [facility]. The address is: [address] [place], [place], [place] [address]. Please see map below for additional directions.    The Maternity Assessment Unit is designed to help you during your pregnancy, and for up to 6 weeks after delivery, with any pregnancy- or postpartum-related emergencies, if you think you are in labor, or if your water has broken. For example, if you experience nausea and vomiting, vaginal bleeding, severe abdominal or pelvic pain, elevated blood pressure or other problems related to your pregnancy or postpartum time, please come to the Maternity Assessment Unit for assistance.       Preterm Labor The normal length of a pregnancy is 39-41 weeks. Preterm labor is when labor starts before 37 completed weeks of pregnancy. Babies who are born prematurely and survive may not be fully developed and may be at an increased risk for long-term problems such as cerebral palsy, developmental delays, and vision andhearing problems. Babies who are born too early may have problems soon after birth. Premature babies may have problems regulating blood sugar, body temperature, heart rate, and breathing rate. These babies often have trouble with feeding. The risk ofhaving problems is highest for babies who are born before 39 weeks of pregnancy. What are the causes? The exact cause of this condition is not known. What increases the risk? You are more likely to have preterm labor if you have certain risk factors that relate to your medical history, problems with present and past pregnancies, andlifestyle factors. Medical history You have abnormalities of the uterus, including a short cervix. You have STIs (sexually transmitted infections) or other infections of the urinary tract and the vagina. You have chronic illnesses, such as blood clotting  problems, diabetes, or high blood pressure. You are overweight or underweight. Present and past pregnancies You have had preterm labor before. You are pregnant with twins or other multiples. You have been diagnosed with a condition in which the placenta covers your cervix (placenta previa). You waited less than 18 months between giving birth and becoming pregnant again. Your unborn baby has some abnormalities. You have vaginal bleeding during pregnancy. You became pregnant through in vitro fertilization (IVF). Lifestyle and environmental factors You use tobacco products or drink alcohol. You use drugs. You have stress and no social support. You experience domestic violence. You are exposed to certain chemicals or environmental pollutants. Other factors You are younger than age 30 or older than age 30. What are the signs or symptoms? Symptoms of this condition include: Cramps similar to those that can happen during a menstrual period. The cramps may happen with diarrhea. Pain in the abdomen or lower back. Regular contractions that may feel like tightening of the abdomen. A feeling of increased pressure in the pelvis. Increased watery or bloody mucus discharge from the vagina. Water breaking (ruptured amniotic sac). How is this diagnosed? This condition is diagnosed based on: Your medical history and a physical exam. A pelvic exam. An ultrasound. Monitoring your uterus for contractions. Other tests, including: A swab of the cervix to check for a chemical called fetal fibronectin. Urine tests. How is this treated? Treatment for this condition depends on the length of your pregnancy, your condition, and the health of your baby. Treatment may include: Taking medicines, such as: Hormone medicines. These may be given early in pregnancy to help support the pregnancy. Medicines to stop contractions. Medicines to  help mature the baby's lungs. These may be prescribed if the risk of  delivery is high. Medicines to help protect your baby from brain and nerve complications such as cerebral palsy. Bed rest. If the labor happens before 34 weeks of pregnancy, you may need to stay in the hospital. Delivery of the baby. Follow these instructions at home:  Do not use any products that contain nicotine or tobacco. These products include cigarettes, chewing tobacco, and vaping devices, such as e-cigarettes. If you need help quitting, ask your health care provider. Do not drink alcohol. Take over-the-counter and prescription medicines only as told by your health care provider. Rest as told by your health care provider. Return to your normal activities as told by your health care provider. Ask your health care provider what activities are safe for you. Keep all follow-up visits. This is important. How is this prevented? To increase your chance of having a full-term pregnancy: Do not use drugs or take medicines that have not been prescribed to you during your pregnancy. Talk with your health care provider before taking any herbal supplements, even if you have been taking them regularly. Make sure you gain a healthy amount of weight during your pregnancy. Watch for infection. If you think that you might have an infection, get it checked right away. Symptoms of infection may include: Fever. Abnormal vaginal discharge or discharge that smells bad. Pain or burning with urination. Needing to urinate urgently. Frequently urinating or passing small amounts of urine frequently. Blood in your urine or urine that smells bad or unusual. Where to find more information U.S. Department of Health and Programmer, systems on Women's Health: [place]BeachSigns.tn [employer] of Obstetricians and Gynecologists: www.acog.org Centers for Disease Control and Prevention, Preterm Birth: http://www.wolf.info/ Contact a health care provider if: You think you are going into preterm labor. You have signs  or symptoms of preterm labor. You have symptoms of infection. Get help right away if: You are having regular, painful contractions every 5 minutes or less. Your water breaks. Summary Preterm labor is labor that starts before you reach 37 weeks of pregnancy. Delivering your baby early increases your baby's risk of developing long-term problems. You are more likely to have preterm labor if you have certain risk factors that relate to your medical history, problems with present and past pregnancies, and lifestyle factors. Keep all follow-up visits. This is important. Contact a health care provider if you have signs or symptoms of preterm labor. This information is not intended to replace advice given to you by your health care provider. Make sure you discuss any questions you have with your healthcare provider. Document Revised: 01/17/2020 Document Reviewed: 01/17/2020 Elsevier Patient Education  [place].       Oral Glucose Tolerance Test During Pregnancy Why am I having this test? The oral glucose tolerance test (OGTT) is done to check how your body processes blood sugar (glucose). This is one of several tests used to diagnose diabetes that develops during pregnancy (gestational diabetes mellitus). Gestational diabetes is a short-term form of diabetes that some women develop while they are pregnant. It usually occurs during the second trimesterof pregnancy and goes away after delivery. Testing, or screening, for gestational diabetes usually occurs at weeks 24-28 of pregnancy. You may have the OGTT test after having a 1-hour glucose screening test if the results from that test indicate that you may have gestational diabetes. This test may also be needed if: You have a history of gestational  diabetes. There is a history of giving birth to very large babies or of losing pregnancies (having stillbirths). You have signs and symptoms of diabetes, such as: Changes in your  eyesight. Tingling or numbness in your hands or feet. Changes in hunger, thirst, and urination, and these are not explained by your pregnancy. What is being tested? This test measures the amount of glucose in your blood at different timesduring a period of 3 hours. This shows how well your body can process glucose. What kind of sample is taken?  Blood samples are required for this test. They are usually collected byinserting a needle into a blood vessel. How do I prepare for this test? For 3 days before your test, eat normally. Have plenty of carbohydrate-rich foods. Follow instructions from your health care provider about: Eating or drinking restrictions on the day of the test. You may be asked not to eat or drink anything other than water (to fast) starting 8-10 hours before the test. Changing or stopping your regular medicines. Some medicines may interfere with this test. Tell a health care provider about: All medicines you are taking, including vitamins, herbs, eye drops, creams, and over-the-counter medicines. Any blood disorders you have. Any surgeries you have had. Any medical conditions you have. What happens during the test? First, your blood glucose will be measured. This is referred to as your fasting blood glucose because you fasted before the test. Then, you will drink a glucose solution that contains a certain amount of glucose. Your blood glucosewill be measured again 1, 2, and 3 hours after you drink the solution. This test takes about 3 hours to complete. You will need to stay at the testing location during this time. During the testing period: Do not eat or drink anything other than the glucose solution. Do not exercise. Do not use any products that contain nicotine or tobacco, such as cigarettes, e-cigarettes, and chewing tobacco. These can affect your test results. If you need help quitting, ask your health care provider. The testing procedure may vary among health care  providers and hospitals. How are the results reported? Your results will be reported as milligrams of glucose per deciliter of blood (mg/dL) or millimoles per liter (mmol/L). There is more than one source for screening and diagnosis reference values used to diagnose gestational diabetes. Your health care provider will compare your results to normal values that were established after testing a large group of people (reference values). Reference values may vary among labs and hospitals. For this test (Carpenter-Coustan), reference values are: Fasting: 95 mg/dL (5.3 mmol/L). 1 hour: 180 mg/dL (10.0 mmol/L). 2 hour: 155 mg/dL (8.6 mmol/L). 3 hour: 140 mg/dL (7.8 mmol/L). What do the results mean? Results below the reference values are considered normal. If two or more of your blood glucose levels are at or above the reference values, you may be diagnosed with gestational diabetes. If only one level is high, your healthcare provider may suggest repeat testing or other tests to confirm a diagnosis. Talk with your health care provider about what your results mean. Questions to ask your health care provider Ask your health care provider, or the department that is doing the test: When will my results be ready? How will I get my results? What are my treatment options? What other tests do I need? What are my next steps? Summary The oral glucose tolerance test (OGTT) is one of several tests used to diagnose diabetes that develops during pregnancy (gestational diabetes mellitus). Gestational diabetes is   a short-term form of diabetes that some women develop while they are pregnant. You may have the OGTT test after having a 1-hour glucose screening test if the results from that test show that you may have gestational diabetes. You may also have this test if you have any symptoms or risk factors for this type of diabetes. Talk with your health care provider about what your results mean. This information is not  intended to replace advice given to you by your health care provider. Make sure you discuss any questions you have with your healthcare provider. Document Revised: 06/23/2019 Document Reviewed: 06/23/2019 Elsevier Patient Education  2022 Elsevier Inc.       Tdap (Tetanus, Diphtheria, Pertussis) Vaccine: What You Need to Know 1. Why get vaccinated? Tdap vaccine can prevent tetanus, diphtheria, and pertussis. Diphtheria and pertussis spread from person to person. Tetanus enters the body through cuts or wounds. TETANUS (T) causes painful stiffening of the muscles. Tetanus can lead to serious health problems, including being unable to open the mouth, having trouble swallowing and breathing, or death. DIPHTHERIA (D) can lead to difficulty breathing, heart failure, paralysis, or death. PERTUSSIS (aP), also known as "whooping cough," can cause uncontrollable, violent coughing that makes it hard to breathe, eat, or drink. Pertussis can be extremely serious especially in babies and young children, causing pneumonia, convulsions, brain damage, or death. In teens and adults, it can cause weight loss, loss of bladder control, passing out, and rib fractures from severe coughing. 2. Tdap vaccine Tdap is only for children 7 years and older, adolescents, and adults.  Adolescents should receive a single dose of Tdap, preferably at age 33 or 12 years. Pregnant people should get a dose of Tdap during every pregnancy, preferably during the early part of the third trimester, to help protect the newborn from pertussis. Infants are most at risk for severe, life-threatening complications frompertussis. Adults who have never received Tdap should get a dose of Tdap. Also, adults should receive a booster dose of either Tdap or Td (a different vaccine that protects against tetanus and diphtheria but not pertussis) every 10 years, or after 5 years in the case of a severe or dirty wound or burn. Tdap may be given at the  same time as other vaccines. 3. Talk with your health care provider Tell your vaccine provider if the person getting the vaccine: Has had an allergic reaction after a previous dose of any vaccine that protects against tetanus, diphtheria, or pertussis, or has any severe, life-threatening allergies Has had a coma, decreased level of consciousness, or prolonged seizures within 7 days after a previous dose of any pertussis vaccine (DTP, DTaP, or Tdap) Has seizures or another nervous system problem Has ever had Guillain-Barr Syndrome (also called "GBS") Has had severe pain or swelling after a previous dose of any vaccine that protects against tetanus or diphtheria In some cases, your health care provider may decide to postpone Tdapvaccination until a future visit. People with minor illnesses, such as a cold, may be vaccinated. People who are moderately or severely ill should usually wait until they recover beforegetting Tdap vaccine.  Your health care provider can give you more information. 4. Risks of a vaccine reaction Pain, redness, or swelling where the shot was given, mild fever, headache, feeling tired, and nausea, vomiting, diarrhea, or stomachache sometimes happen after Tdap vaccination. People sometimes faint after medical procedures, including vaccination. Tellyour provider if you feel dizzy or have vision changes or ringing in the ears.  As with any medicine, there is a very remote chance of a vaccine causing asevere allergic reaction, other serious injury, or death. 5. What if there is a serious problem? An allergic reaction could occur after the vaccinated person leaves the clinic. If you see signs of a severe allergic reaction (hives, swelling of the face and throat, difficulty breathing, a fast heartbeat, dizziness, or weakness), call 9-1-1and get the person to the nearest hospital. For other signs that concern you, call your health care provider.  Adverse reactions should be reported  to the Vaccine Adverse Event Reporting System (VAERS). Your health care provider will usually file this report, or you can do it yourself. Visit the VAERS website at www.vaers.LAgents.no or call 640-360-6101. VAERS is only for reporting reactions, and VAERS staff members do not give medical advice. 6. The National Vaccine Injury Compensation Program The [employer] Vaccine Injury Compensation Program (VICP) is a federal program that was created to compensate people who may have been injured by certain vaccines. Claims regarding alleged injury or death due to vaccination have a time limit for filing, which may be as short as two years. Visit the VICP website at SpiritualWord.at or call 614-012-8026to learn about the program and about filing a claim. 7. How can I learn more? Ask your health care provider. Call your local or state health department. Visit the website of the Food and Drug Administration (FDA) for vaccine package inserts and additional information at FinderList.no. Contact the Centers for Disease Control and Prevention (CDC): Call 541-502-8378 (1-800-CDC-INFO) or Visit CDC's website at PicCapture.uy. Vaccine Information Statement Tdap (Tetanus, Diphtheria, Pertussis) Vaccine(09/02/2019) This information is not intended to replace advice given to you by your health care provider. Make sure you discuss any questions you have with your healthcare provider. Document Revised: 09/28/2019 Document Reviewed: 09/28/2019 Elsevier Patient Education  2022 Elsevier Inc.       AREA PEDIATRIC/FAMILY PRACTICE PHYSICIANS  [facility] [address] [place], [place] [address] Phone - [phone]   Fax - [phone]  JACK AMOS [address] [place], [place]  [address] Phone - [phone]   Fax - [phone]  [facility] [address] [place], [place]  [address] Phone - [phone]   Fax -  [phone]  [facility] [address] [place], [place]  [address] Phone - [phone]   Fax - [phone]  [facility] [address] [place], [place]  [address] Phone - [phone]   Fax - [phone]  [facility] [address] [place], [place]  [address] Phone - [phone]   Fax - [phone]  [facility] [address] [place], [place]  [address] Phone - [phone]   Fax - [phone]  [facility] AT [facility] [address] [place], [address] [place], [place]  [address] Phone - [phone]   Fax - [phone]  [facility] AT [facility] [address] [place], [place]  [address] Phone - [phone]   Fax - [phone] [facility] [place] [address] [place], [place]  [address] Phone - [phone]   Fax - [phone]  [facility] [address] [place], [place]  [address] Phone - [phone]   Fax - [phone]  [facility] [address] [place], [place]  [address] Phone - [phone]   Fax - [phone]  [facility] [place]  [address] [place], [place]  [address] Phone - [phone]   Fax - [phone]  [facility] [address] [place], [place]  [address] Phone - [phone]  [facility] [address] [place], [place]  [address] Phone - [phone]   Fax - [phone]  [facility] [address] [place], [place]  [address] Phone - [phone]   Fax - [phone]  [facility] [address] [place], [place]  [address] Phone - [phone]   Fax - [phone]  [facility] [address] [place], [place]  [address] Phone - [phone]   Fax - [phone]   [facility] [address] [place], [place]  [address] Phone - [phone]   Fax - [phone]  [facility] [address] [place], [place]  [address] Phone - [phone]   Fax - [phone]  DAVID RUBIN [address] [place], [place]  [address] Phone - [phone]   Fax - [phone]  [facility] [address] [place], [place]  [address] Phone - [phone]   Fax - [phone]  [facility] Al[facility] Ch[facility] [address] [place], [place]  [address] Phone - [phone]   Fax - [phone] Gerarda Fraction [address] [place], [place]  [address] Phone - [phone]   Fax - [phone]  [facility] [address] [place], [place]  [address] Phone - [phone]   Fax - [phone]  [facility] [place] [address] [place], [place]  [address] Phone - [phone]   Fax - [phone]                         Safe Medications in Pregnancy    Acne: Benzoyl Peroxide Salicylic Acid  Backache/Headache: Tylenol: 2 regular strength every 4 hours OR              2 Extra strength every 6 hours  Colds/Coughs/Allergies: Benadryl (alcohol free) 25 mg every 6 hours as needed Breath right strips Claritin Cepacol throat lozenges Chloraseptic throat spray Cold-Eeze- up to three times per day Cough drops, alcohol free Flonase (by prescription only) Guaifenesin Mucinex Robitussin DM (plain only, alcohol free) Saline nasal spray/drops Sudafed (pseudoephedrine) & Actifed ** use only after [redacted] weeks gestation and if you do not have high blood pressure Tylenol Vicks Vaporub Zinc lozenges Zyrtec   Constipation: Colace Ducolax suppositories Fleet enema Glycerin suppositories Metamucil Milk of magnesia Miralax Senokot Smooth move tea  Diarrhea: Kaopectate Imodium A-D  *NO pepto Bismol  Hemorrhoids: Anusol Anusol HC Preparation  H Tucks  Indigestion: Tums Maalox Mylanta Zantac  Pepcid  Insomnia: Benadryl (alcohol free) 25mg  every 6 hours as needed Tylenol PM Unisom, no Gelcaps  Leg Cramps: Tums MagGel  Nausea/Vomiting:  Bonine Dramamine Emetrol Ginger extract Sea bands Meclizine  Nausea medication to take during pregnancy:  Unisom (doxylamine succinate 25 mg tablets) Take one tablet daily at bedtime. If symptoms are not adequately controlled, the dose can be increased to a maximum recommended dose of two tablets daily (1/2 tablet in the morning, 1/2 tablet mid-afternoon and one at bedtime). Vitamin B6 100mg  tablets. Take one tablet twice a day (up to 200 mg per day).  Skin Rashes: Aveeno products Benadryl cream or 25mg  every 6 hours as needed Calamine Lotion 1% cortisone cream  Yeast infection: Gyne-lotrimin 7 Monistat 7   **  If taking multiple medications, please check labels to avoid duplicating the same active ingredients **take medication as directed on the label ** Do not exceed 4000 mg of tylenol in 24 hours **Do not take medications that contain aspirin or ibuprofen

## 2020-08-02 NOTE — Progress Notes (Addendum)
Subjective:  Heather Malone is a 30 y.o. G2P1001 at [redacted]w[redacted]d being seen today for ongoing prenatal care.  She is currently monitored for the following issues for this low-risk pregnancy and has Supervision of low-risk pregnancy; Chronic headaches; and Alpha thalassemia silent carrier on their problem list.  Patient reports no complaints.  Contractions: Irritability. Vag. Bleeding: None.  Movement: Present. Denies leaking of fluid.   The following portions of the patient's history were reviewed and updated as appropriate: allergies, current medications, past family history, past medical history, past social history, past surgical history and problem list. Problem list updated.  Objective:   Vitals:   08/02/20 0841  BP: 113/78  Pulse: 78  Weight: 155 lb 12.8 oz (70.7 kg)    Fetal Status: Fetal Heart Rate (bpm): 153 Fundal Height: 28 cm Movement: Present     General:  Alert, oriented and cooperative. Patient is in no acute distress.  Skin: Skin is warm and dry. No rash noted.   Cardiovascular: Normal heart rate noted  Respiratory: Normal respiratory effort, no problems with respiration noted  Abdomen: Soft, gravid, appropriate for gestational age. Pain/Pressure: Present     Pelvic: Vag. Bleeding: None     Cervical exam deferred        Extremities: Normal range of motion.  Edema: Trace  Mental Status: Normal mood and affect. Normal behavior. Normal judgment and thought content.   Urinalysis:      Assessment and Plan:  Pregnancy: G2P1001 at [redacted]w[redacted]d  1. Encounter for supervision of low-risk pregnancy in third trimester -GTT/labs today -discussed Tdap, pt accepts -peds list given  2. [redacted] weeks gestation of pregnancy  Preterm labor symptoms and general obstetric precautions including but not limited to vaginal bleeding, contractions, leaking of fluid and fetal movement were reviewed in detail with the patient. I discussed the assessment and treatment plan with the patient. The patient  was provided an opportunity to ask questions and all were answered. The patient agreed with the plan and demonstrated an understanding of the instructions. The patient was advised to call back or seek an in-person office evaluation/go to MAU at [facility] for any urgent or concerning symptoms. Please refer to After Visit Summary for other counseling recommendations.  Return in about 2 weeks (around 08/16/2020) for in-person LOB/MIDWIFE ONLY/discuss WB.   Heather Malone, Odie Sera, NP

## 2020-08-03 LAB — CBC
Hematocrit: 28.5 % — ABNORMAL LOW (ref 34.0–46.6)
Hemoglobin: 9.6 g/dL — ABNORMAL LOW (ref 11.1–15.9)
MCH: 28.1 pg (ref 26.6–33.0)
MCHC: 33.7 g/dL (ref 31.5–35.7)
MCV: 83 fL (ref 79–97)
Platelets: 269 10*3/uL (ref 150–450)
RBC: 3.42 x10E6/uL — ABNORMAL LOW (ref 3.77–5.28)
RDW: 12.3 % (ref 11.7–15.4)
WBC: 5.6 10*3/uL (ref 3.4–10.8)

## 2020-08-03 LAB — GLUCOSE TOLERANCE, 2 HOURS W/ 1HR
Glucose, 1 hour: 141 mg/dL (ref 65–179)
Glucose, 2 hour: 109 mg/dL (ref 65–152)
Glucose, Fasting: 72 mg/dL (ref 65–91)

## 2020-08-03 LAB — HIV ANTIBODY (ROUTINE TESTING W REFLEX): HIV Screen 4th Generation wRfx: NONREACTIVE

## 2020-08-03 LAB — RPR: RPR Ser Ql: NONREACTIVE

## 2020-08-05 ENCOUNTER — Encounter: Payer: Self-pay | Admitting: Women's Health

## 2020-08-05 DIAGNOSIS — O99019 Anemia complicating pregnancy, unspecified trimester: Secondary | ICD-10-CM | POA: Insufficient documentation

## 2020-08-14 ENCOUNTER — Ambulatory Visit (INDEPENDENT_AMBULATORY_CARE_PROVIDER_SITE_OTHER): Payer: Medicaid Other | Admitting: Family Medicine

## 2020-08-14 ENCOUNTER — Encounter: Payer: Self-pay | Admitting: Family Medicine

## 2020-08-14 VITALS — BP 122/82 | HR 67 | Wt 159.1 lb

## 2020-08-14 DIAGNOSIS — Z3493 Encounter for supervision of normal pregnancy, unspecified, third trimester: Secondary | ICD-10-CM

## 2020-08-14 MED ORDER — ONDANSETRON 4 MG PO TBDP: 4.0000 mg | ORAL_TABLET | Freq: Three times a day (TID) | ORAL | 1 refills | Status: DC | PRN

## 2020-08-14 MED ORDER — PROMETHAZINE HCL 25 MG PO TABS: 25.0000 mg | ORAL_TABLET | Freq: Four times a day (QID) | ORAL | 1 refills | Status: DC | PRN

## 2020-08-14 NOTE — Patient Instructions (Signed)

## 2020-08-14 NOTE — Progress Notes (Signed)
   Subjective:  Heather Malone is a 30 y.o. G2P1001 at [redacted]w[redacted]d being seen today for ongoing prenatal care.  She is currently monitored for the following issues for this low-risk pregnancy and has Supervision of low-risk pregnancy; Chronic headaches; Alpha thalassemia silent carrier; and Anemia in pregnancy on their problem list.  Patient reports no complaints.  Contractions: Irritability. Vag. Bleeding: None.  Movement: Present. Denies leaking of fluid.   The following portions of the patient's history were reviewed and updated as appropriate: allergies, current medications, past family history, past medical history, past social history, past surgical history and problem list. Problem list updated.  Objective:   Vitals:   08/14/20 1117  BP: 122/82  Pulse: 67  Weight: 159 lb 1.6 oz (72.2 kg)    Fetal Status: Fetal Heart Rate (bpm): 143   Movement: Present     General:  Alert, oriented and cooperative. Patient is in no acute distress.  Skin: Skin is warm and dry. No rash noted.   Cardiovascular: Normal heart rate noted  Respiratory: Normal respiratory effort, no problems with respiration noted  Abdomen: Soft, gravid, appropriate for gestational age. Pain/Pressure: Present     Pelvic: Vag. Bleeding: None Vag D/C Character: Watery   Cervical exam deferred        Extremities: Normal range of motion.  Edema: Trace  Mental Status: Normal mood and affect. Normal behavior. Normal judgment and thought content.   Urinalysis:      Assessment and Plan:  Pregnancy: G2P1001 at [redacted]w[redacted]d  1. Encounter for supervision of low-risk pregnancy in third trimester BP and FHR normal Some n/v, rx sent for anti emetics Considering tubal, counseled today, papers signed but still deciding  Preterm labor symptoms and general obstetric precautions including but not limited to vaginal bleeding, contractions, leaking of fluid and fetal movement were reviewed in detail with the patient. Please refer to After  Visit Summary for other counseling recommendations.  Return in 2 weeks (on 08/28/2020) for ob visit, [facility]   Venora Maples, MD

## 2020-08-14 NOTE — Progress Notes (Signed)
Pt states in the past 2 weeks she has been having nausea & vomiting.

## 2020-08-24 ENCOUNTER — Encounter (HOSPITAL_COMMUNITY): Payer: Self-pay | Admitting: Obstetrics and Gynecology

## 2020-08-24 ENCOUNTER — Telehealth: Payer: Self-pay

## 2020-08-24 ENCOUNTER — Inpatient Hospital Stay (HOSPITAL_COMMUNITY)
Admission: AD | Admit: 2020-08-24 | Discharge: 2020-08-24 | Disposition: A | Discharge status: Home / Self Care | Payer: Medicaid Other | Attending: Obstetrics and Gynecology | Admitting: Obstetrics and Gynecology

## 2020-08-24 ENCOUNTER — Other Ambulatory Visit: Payer: Self-pay

## 2020-08-24 DIAGNOSIS — Z79899 Other long term (current) drug therapy: Secondary | ICD-10-CM | POA: Diagnosis not present

## 2020-08-24 DIAGNOSIS — O99513 Diseases of the respiratory system complicating pregnancy, third trimester: Secondary | ICD-10-CM | POA: Insufficient documentation

## 2020-08-24 DIAGNOSIS — Z3A31 31 weeks gestation of pregnancy: Secondary | ICD-10-CM | POA: Insufficient documentation

## 2020-08-24 DIAGNOSIS — J45909 Unspecified asthma, uncomplicated: Secondary | ICD-10-CM | POA: Diagnosis not present

## 2020-08-24 DIAGNOSIS — O212 Late vomiting of pregnancy: Secondary | ICD-10-CM | POA: Diagnosis not present

## 2020-08-24 DIAGNOSIS — O219 Vomiting of pregnancy, unspecified: Secondary | ICD-10-CM

## 2020-08-24 DIAGNOSIS — O368130 Decreased fetal movements, third trimester, not applicable or unspecified: Secondary | ICD-10-CM | POA: Diagnosis present

## 2020-08-24 LAB — URINALYSIS, ROUTINE W REFLEX MICROSCOPIC
Bilirubin Urine: NEGATIVE
Glucose, UA: NEGATIVE mg/dL
Hgb urine dipstick: NEGATIVE
Ketones, ur: NEGATIVE mg/dL
Nitrite: NEGATIVE
Protein, ur: NEGATIVE mg/dL
Specific Gravity, Urine: 1.013 (ref 1.005–1.030)
pH: 7 (ref 5.0–8.0)

## 2020-08-24 MED ORDER — SUCRALFATE 1 GM/10ML PO SUSP
1.0000 g | Freq: Once | ORAL | Status: AC
  Administered 2020-08-24: 1 g via ORAL
  Filled 2020-08-24: qty 10

## 2020-08-24 MED ORDER — PROMETHAZINE HCL 25 MG PO TABS
25.0000 mg | ORAL_TABLET | Freq: Once | ORAL | Status: AC
  Administered 2020-08-24: 25 mg via ORAL
  Filled 2020-08-24: qty 1

## 2020-08-24 NOTE — Telephone Encounter (Signed)
Pt call transferred from the front office.  Pt reports vomiting blood.  I asked pt if it was mixed with anything as when you vomit with nothing in stomach it can cause irritation to the stomach and/or throat and the vomit can have blood in it.  Pt stated "no, that is not it at all.  I am literally throwing up straight blood."  I advised pt that because she is describing that type of emesis if she could go to MAU for evaluation.  Pt verbalized understanding with no further questions.   Leonette Nutting  08/24/20

## 2020-08-24 NOTE — Discharge Instructions (Signed)

## 2020-08-24 NOTE — MAU Provider Note (Signed)
History     CSN: [id number]  Arrival date and time: 08/24/20 5329   Event Date/Time   First Provider Initiated Contact with Patient 08/24/20 1133      Chief Complaint  Patient presents with   Decreased Fetal Movement   Abdominal Pain   Hematemesis   HPI Heather Malone is a 30 y.o. G2P1001 at [redacted]w[redacted]d who presents with bloody vomit. She reports she has vomited every day for the last 2 weeks. She states this am, she was throwing up blood. She has not taken any antiemetics or attempted to eat since the vomiting. She also reports some lower abdominal cramping. She rates the pain a 3/10 and has not taken anything for the pain. She states she is feeling movement but it was less until her arrival to MAU. She now feels normal movement. She denies any bleeding or leaking.   OB History     Gravida  2   Para  1   Term  1   Preterm  0   AB  0   Living  1      SAB  0   IAB  0   Ectopic  0   Multiple  0   Live Births  1           Past Medical History:  Diagnosis Date   Anemia    Anxiety    Asthma    albuterol prn   Headache    migraines   Hx of trichomoniasis    Hx of varicella    Neck injury, subsequent encounter 07/26/2016   Placental abruption in third trimester 09/04/2014   Subchorionic hemorrhage in third trimester 07/26/2014   Velamentous insertion of umbilical cord     Past Surgical History:  Procedure Laterality Date   TONSILLECTOMY      Family History  Problem Relation Age of Onset   Hypertension Mother    Hypertension Maternal Grandmother    Heart attack Maternal Grandmother    Hypertension Maternal Grandfather    Cancer Maternal Grandfather    Diabetes Paternal Grandmother     Social History   Tobacco Use   Smoking status: Former    Types: Cigars   Smokeless tobacco: Never  Vaping Use   Vaping Use: Never used  Substance Use Topics   Alcohol use: Not Currently   Drug use: Not Currently    Types: Marijuana    Comment: none with +  UPT    Allergies: No Known Allergies  Medications Prior to Admission  Medication Sig Dispense Refill Last Dose   iron polysaccharides (NIFEREX) 150 MG capsule Take 1 capsule (150 mg total) by mouth daily. 30 capsule 2 08/24/2020   omeprazole (PRILOSEC) 20 MG capsule Take 1 capsule (20 mg total) by mouth daily. 60 capsule 2 08/23/2020   ondansetron (ZOFRAN ODT) 4 MG disintegrating tablet Take 1 tablet (4 mg total) by mouth every 8 (eight) hours as needed for nausea or vomiting. 30 tablet 1 Past Month   Prenatal Vit-Fe Fumarate-FA (PRENATAL MULTIVITAMIN) TABS tablet Take 1 tablet by mouth daily at 12 noon.   08/24/2020   promethazine (PHENERGAN) 25 MG tablet Take 1 tablet (25 mg total) by mouth every 6 (six) hours as needed for nausea or vomiting. 30 tablet 1 Past Month   Butalbital-APAP-Caffeine 50-325-40 MG capsule Take 1-2 capsules by mouth every 6 (six) hours as needed for headache. 30 capsule 3 More than a month   cyclobenzaprine (FLEXERIL) 10 MG tablet Take 1  tablet (10 mg total) by mouth 3 (three) times daily as needed (headache). 30 tablet 1 More than a month   metroNIDAZOLE (FLAGYL) 500 MG tablet Take 1 tablet (500 mg total) by mouth 2 (two) times daily. 14 tablet 1     Review of Systems  Constitutional: Negative.  Negative for fatigue and fever.  HENT: Negative.    Respiratory: Negative.  Negative for shortness of breath.   Cardiovascular: Negative.  Negative for chest pain.  Gastrointestinal:  Positive for abdominal pain and vomiting. Negative for constipation, diarrhea and nausea.  Genitourinary: Negative.  Negative for dysuria, vaginal bleeding and vaginal discharge.  Neurological: Negative.  Negative for dizziness and headaches.  Physical Exam   Blood pressure 123/87, pulse 75, temperature 98.3 F (36.8 C), resp. rate 16, height 5\' 2"  (1.575 m), weight 72.6 kg, last menstrual period 01/11/2020, SpO2 100 %, unknown if currently breastfeeding.  Physical Exam Vitals and nursing  note reviewed.  Constitutional:      General: She is not in acute distress.    Appearance: She is well-developed.  HENT:     Head: Normocephalic.  Eyes:     Pupils: Pupils are equal, round, and reactive to light.  Cardiovascular:     Rate and Rhythm: Normal rate and regular rhythm.     Heart sounds: Normal heart sounds.  Pulmonary:     Effort: Pulmonary effort is normal. No respiratory distress.     Breath sounds: Normal breath sounds.  Abdominal:     General: Bowel sounds are normal. There is no distension.     Palpations: Abdomen is soft.     Tenderness: There is no abdominal tenderness.  Skin:    General: Skin is warm and dry.  Neurological:     Mental Status: She is alert and oriented to person, place, and time.  Psychiatric:        Mood and Affect: Mood normal.        Behavior: Behavior normal.        Thought Content: Thought content normal.        Judgment: Judgment normal.   Fetal Tracing:  Baseline: 130 Variability: moderate Accels: 15x15 Decels: none  Toco: none   Cervix: closed/thick/posterior  MAU Course  Procedures Results for orders placed or performed during the hospital encounter of 08/24/20 (from the past 24 hour(s))  Urinalysis, Routine w reflex microscopic     Status: Abnormal   Collection Time: 08/24/20 10:56 AM  Result Value Ref Range   Color, Urine YELLOW YELLOW   APPearance HAZY (A) CLEAR   Specific Gravity, Urine 1.013 1.005 - 1.030   pH 7.0 5.0 - 8.0   Glucose, UA NEGATIVE NEGATIVE mg/dL   Hgb urine dipstick NEGATIVE NEGATIVE   Bilirubin Urine NEGATIVE NEGATIVE   Ketones, ur NEGATIVE NEGATIVE mg/dL   Protein, ur NEGATIVE NEGATIVE mg/dL   Nitrite NEGATIVE NEGATIVE   Leukocytes,Ua SMALL (A) NEGATIVE   RBC / HPF 0-5 0 - 5 RBC/hpf   WBC, UA 0-5 0 - 5 WBC/hpf   Bacteria, UA RARE (A) NONE SEEN   Squamous Epithelial / LPF 11-20 0 - 5    MDM UA, UC Phenergan PO Carafate- patient reports improvement of discomfort in esophagus No signs of  preterm labor at this time. No episodes of vomiting while in MAU  Assessment and Plan   1. Nausea/vomiting in pregnancy   2. [redacted] weeks gestation of pregnancy    -Discharge home in stable condition -Third trimester precautions discussed -Patient  advised to follow-up with OB as scheduled for prenatal care -Patient may return to MAU as needed or if her condition were to change or worsen   Rolm Bookbinder CNM 08/24/2020, 11:33 AM

## 2020-08-24 NOTE — MAU Note (Signed)
Pt reports she was vomiting up bright red blood this am. Reports she had hyperemesis really bad until about 20 weeks and it got better but it started again about 2 weeks ago. Also reports some bilateral lower abd pain since this am. Reports she has felt the baby move but just not as much as normal .

## 2020-08-25 LAB — CULTURE, OB URINE: Culture: <10000 — AB

## 2020-08-28 ENCOUNTER — Ambulatory Visit (INDEPENDENT_AMBULATORY_CARE_PROVIDER_SITE_OTHER): Payer: Medicaid Other | Admitting: Nurse Practitioner

## 2020-08-28 ENCOUNTER — Other Ambulatory Visit: Payer: Self-pay

## 2020-08-28 VITALS — BP 128/86 | HR 88 | Wt 164.7 lb

## 2020-08-28 DIAGNOSIS — Z3A31 31 weeks gestation of pregnancy: Secondary | ICD-10-CM

## 2020-08-28 DIAGNOSIS — R102 Pelvic and perineal pain: Secondary | ICD-10-CM

## 2020-08-28 DIAGNOSIS — Z3492 Encounter for supervision of normal pregnancy, unspecified, second trimester: Secondary | ICD-10-CM

## 2020-08-28 DIAGNOSIS — O26893 Other specified pregnancy related conditions, third trimester: Secondary | ICD-10-CM

## 2020-08-28 NOTE — Progress Notes (Signed)
Pain on right side causes her trouble walking some times.

## 2020-08-28 NOTE — Progress Notes (Signed)
    Subjective:  Heather Malone is a 30 y.o. G2P1001 at [redacted]w[redacted]d being seen today for ongoing prenatal care.  She is currently monitored for the following issues for this low-risk pregnancy and has Supervision of low-risk pregnancy; Chronic headaches; Alpha thalassemia silent carrier; and Anemia in pregnancy on their problem list.  Patient reports  pain when walking twice a week .  Contractions: Not present. Vag. Bleeding: None.  Movement: Present. Denies leaking of fluid.   The following portions of the patient's history were reviewed and updated as appropriate: allergies, current medications, past family history, past medical history, past social history, past surgical history and problem list. Problem list updated.  Objective:   Vitals:   08/28/20 1348  BP: 128/86  Pulse: 88  Weight: 164 lb 11.2 oz (74.7 kg)    Fetal Status: Fetal Heart Rate (bpm): 153   Movement: Present     General:  Alert, oriented and cooperative. Patient is in no acute distress.  Skin: Skin is warm and dry. No rash noted.   Cardiovascular: Normal heart rate noted  Respiratory: Normal respiratory effort, no problems with respiration noted  Abdomen: Soft, gravid, appropriate for gestational age. Pain/Pressure: Present     Pelvic:  Cervical exam deferred        Extremities: Normal range of motion.  Edema: None  Mental Status: Normal mood and affect. Normal behavior. Normal judgment and thought content.   Urinalysis:      Assessment and Plan:  Pregnancy: G2P1001 at [redacted]w[redacted]d  1. Encounter for supervision of low-risk pregnancy in second trimester Doing well Reviewed pediatrician she has selected. No further headaches for now Went to waterbirth class - will send certificate through MyChart To meet with midwife at next visit  2. Pelvic pain affecting pregnancy in third trimester, antepartum Having trouble walking about twice a week Has pain with palpation of pubic symphysis Will refer for pelvic PT - advised  to call us Thursday afternoon if she has not had a call from [place] office  - Ambulatory referral to Physical Therapy  3. [redacted] weeks gestation of pregnancy   Preterm labor symptoms and general obstetric precautions including but not limited to vaginal bleeding, contractions, leaking of fluid and fetal movement were reviewed in detail with the patient. Please refer to After Visit Summary for other counseling recommendations.  Return in about 2 weeks (around 09/11/2020) for Needs to be seen by midwife for waterbirth info.  Nolene Bernheim, RN, MSN, NP-BC Nurse Practitioner, [facility] for [employer], [facility] 08/28/2020 2:09 PM

## 2020-08-30 ENCOUNTER — Ambulatory Visit: Payer: Medicaid Other | Attending: Nurse Practitioner | Admitting: Physical Therapy

## 2020-08-30 ENCOUNTER — Encounter: Payer: Self-pay | Admitting: Physical Therapy

## 2020-08-30 ENCOUNTER — Other Ambulatory Visit: Payer: Self-pay

## 2020-08-30 DIAGNOSIS — R2689 Other abnormalities of gait and mobility: Secondary | ICD-10-CM | POA: Diagnosis present

## 2020-08-30 DIAGNOSIS — M6281 Muscle weakness (generalized): Secondary | ICD-10-CM | POA: Diagnosis present

## 2020-08-30 DIAGNOSIS — M79604 Pain in right leg: Secondary | ICD-10-CM | POA: Diagnosis present

## 2020-08-30 NOTE — Therapy (Addendum)
[facility] [address], [place], [place], [address] Phone: [phone]   Fax:  [phone]  Physical Therapy Evaluation  Patient Details  Name: Heather Malone MRN: [id number] Date of Birth: 1990-09-26 Referring Provider (PT): Virginia Rochester, NP   Encounter Date: 08/30/2020   PT End of Session - 08/30/20 1615     Visit Number 1    Authorization Type [employer]    PT Start Time 1409    PT Stop Time 6803    PT Time Calculation (min) 36 min    Activity Tolerance Patient tolerated treatment well    Behavior During Therapy [facility] for tasks assessed/performed             Past Medical History:  Diagnosis Date   Anemia    Anxiety    Asthma    albuterol prn   Headache    migraines   Hx of trichomoniasis    Hx of varicella    Neck injury, subsequent encounter 07/26/2016   Placental abruption in third trimester 09/04/2014   Subchorionic hemorrhage in third trimester 07/26/2014   Velamentous insertion of umbilical cord     Past Surgical History:  Procedure Laterality Date   TONSILLECTOMY      There were no vitals filed for this visit.    Subjective Assessment - 08/30/20 1412     Subjective Pt reports pain in anterior pelvis and sometimes into Rt anterior hip and sometimes low back pain    Pertinent History pregnant, 2nd pregnancy (first 6yo son)    How long can you sit comfortably? 20 mins    How long can you stand comfortably? 30-45 mins    How long can you walk comfortably? 30-45 mins    Patient Stated Goals to have less pain    Currently in Pain? Yes    Pain Score 4     Pain Location Pelvis    Pain Orientation Anterior;Medial    Pain Descriptors / Indicators Stabbing    Pain Type Acute pain    Pain Radiating Towards sometimes into the Rt leg    Pain Onset 1 to 4 weeks ago    Pain Frequency Intermittent    Aggravating Factors  do any activity for too long    Pain Relieving Factors heat, rest,  change of position                [facility] PT Assessment - 08/30/20 0001       Assessment   Medical Diagnosis O26.893,R10.2 (ICD-10-CM) - Pelvic pain affecting pregnancy in third trimester, antepartum    Referring Provider (PT) Ephraim Hamburger, Rona Ravens, NP    Onset Date/Surgical Date --   2 weeks ago   Prior Therapy none      Precautions   Precautions None      Restrictions   Weight Bearing Restrictions Yes      Balance Screen   Has the patient fallen in the past 6 months No    Has the patient had a decrease in activity level because of a fear of falling?  No    Is the patient reluctant to leave their home because of a fear of falling?  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 pt denied allergies to tape. pt would benefit from PT to address deficits found during eval.    Personal Factors and Comorbidities Comorbidity 1;Fitness    Comorbidities pregnancy    Examination-Activity Limitations Locomotion Level;Dressing;Bed Mobility;Stand;Sit    Examination-Participation Restrictions Community Activity;Occupation    Stability/Clinical Decision Making Evolving/Moderate complexity    Clinical Decision Making Moderate    Rehab Potential Good    PT Frequency Biweekly    PT Duration 8 weeks    PT Treatment/Interventions ADLs/Self Care Home Management;Stair training;Functional mobility training;Therapeutic activities;Therapeutic exercise;Neuromuscular re-education;Manual techniques;Energy conservation;Passive range of motion;Patient/family education;Taping    PT Next Visit Plan go over HEP, taping, hip stretching    PT Home Exercise Plan Access Code [email or web address]    Consulted and Agree with Plan of Care Patient             Patient will benefit from skilled therapeutic intervention in order to improve the following deficits and impairments:  Decreased activity tolerance, Decreased coordination, Decreased mobility, Decreased endurance, Decreased strength, Impaired flexibility, Postural dysfunction, Improper body mechanics, Increased fascial restricitons, Pain  Visit Diagnosis: Muscle weakness (generalized) - Plan: PT plan of care cert/re-cert  Other  abnormalities of gait and mobility - Plan: PT plan of care cert/re-cert  Pain in right leg - Plan: PT plan of care cert/re-cert     Problem List Patient Active Problem List   Diagnosis Date Noted   Anemia in pregnancy 08/05/2020   Alpha thalassemia silent carrier 08/02/2020   Supervision of low-risk pregnancy 04/12/2020   Chronic headaches 04/12/2020   Stacy Gardner, PT 08/04/225:11 PM  PHYSICAL THERAPY DISCHARGE SUMMARY  Visits from Start of Care: 08/30/20  Current functional level related to goals / functional outcomes: Pt only seen of initial evaluation, deficits remain however pt delivered    Remaining deficits: Unable to formally reassess as pt only seen for initial eval.   Education / Equipment: HEP   Patient agrees to discharge. Patient goals were not met. Patient is being discharged due to a change in medical status. Pt requests to be discharged at this time due to delivery of her child.  Thank you for the referral.   [place] [address] [place], [place], [address] Phone: [phone]   Fax:  [phone]  Name: Heather Malone MRN: [id number] Date of Birth: 25-May-1990  or with adverse reactions and pt agreed. pt denied allergies to tape. pt would benefit from PT to address deficits found during eval.    Personal Factors and Comorbidities Comorbidity 1;Fitness    Comorbidities pregnancy    Examination-Activity Limitations Locomotion Level;Dressing;Bed Mobility;Stand;Sit    Examination-Participation Restrictions Community Activity;Occupation    Stability/Clinical Decision Making Evolving/Moderate complexity    Clinical Decision Making Moderate    Rehab Potential Good    PT Frequency Biweekly    PT Duration 8 weeks    PT Treatment/Interventions ADLs/Self Care Home Management;Stair training;Functional mobility training;Therapeutic activities;Therapeutic exercise;Neuromuscular re-education;Manual techniques;Energy conservation;Passive range of motion;Patient/family education;Taping    PT Next Visit Plan go over HEP, taping, hip stretching    PT Home Exercise Plan Access Code [email or web address]    Consulted and Agree with Plan of Care Patient             Patient will benefit from skilled therapeutic intervention in order to improve the following deficits and impairments:  Decreased activity tolerance, Decreased coordination, Decreased mobility, Decreased endurance, Decreased strength, Impaired flexibility, Postural dysfunction, Improper body mechanics, Increased fascial restricitons, Pain  Visit Diagnosis: Muscle weakness (generalized) - Plan: PT plan of care cert/re-cert  Other  abnormalities of gait and mobility - Plan: PT plan of care cert/re-cert  Pain in right leg - Plan: PT plan of care cert/re-cert     Problem List Patient Active Problem List   Diagnosis Date Noted   Anemia in pregnancy 08/05/2020   Alpha thalassemia silent carrier 08/02/2020   Supervision of low-risk pregnancy 04/12/2020   Chronic headaches 04/12/2020   Stacy Gardner, PT 08/04/225:11 PM  PHYSICAL THERAPY DISCHARGE SUMMARY  Visits from Start of Care: 08/30/20  Current functional level related to goals / functional outcomes: Pt only seen of initial evaluation, deficits remain however pt delivered    Remaining deficits: Unable to formally reassess as pt only seen for initial eval.   Education / Equipment: HEP   Patient agrees to discharge. Patient goals were not met. Patient is being discharged due to a change in medical status. Pt requests to be discharged at this time due to delivery of her child.  Thank you for the referral.   [place] [address] [place], [place], [address] Phone: [phone]   Fax:  [phone]  Name: Heather Malone MRN: [id number] Date of Birth: 25-May-1990  [facility] [address], [place], [place], [address] Phone: [phone]   Fax:  [phone]  Physical Therapy Evaluation  Patient Details  Name: Heather Malone MRN: [id number] Date of Birth: 1990-09-26 Referring Provider (PT): Virginia Rochester, NP   Encounter Date: 08/30/2020   PT End of Session - 08/30/20 1615     Visit Number 1    Authorization Type [employer]    PT Start Time 1409    PT Stop Time 6803    PT Time Calculation (min) 36 min    Activity Tolerance Patient tolerated treatment well    Behavior During Therapy [facility] for tasks assessed/performed             Past Medical History:  Diagnosis Date   Anemia    Anxiety    Asthma    albuterol prn   Headache    migraines   Hx of trichomoniasis    Hx of varicella    Neck injury, subsequent encounter 07/26/2016   Placental abruption in third trimester 09/04/2014   Subchorionic hemorrhage in third trimester 07/26/2014   Velamentous insertion of umbilical cord     Past Surgical History:  Procedure Laterality Date   TONSILLECTOMY      There were no vitals filed for this visit.    Subjective Assessment - 08/30/20 1412     Subjective Pt reports pain in anterior pelvis and sometimes into Rt anterior hip and sometimes low back pain    Pertinent History pregnant, 2nd pregnancy (first 6yo son)    How long can you sit comfortably? 20 mins    How long can you stand comfortably? 30-45 mins    How long can you walk comfortably? 30-45 mins    Patient Stated Goals to have less pain    Currently in Pain? Yes    Pain Score 4     Pain Location Pelvis    Pain Orientation Anterior;Medial    Pain Descriptors / Indicators Stabbing    Pain Type Acute pain    Pain Radiating Towards sometimes into the Rt leg    Pain Onset 1 to 4 weeks ago    Pain Frequency Intermittent    Aggravating Factors  do any activity for too long    Pain Relieving Factors heat, rest,  change of position                [facility] PT Assessment - 08/30/20 0001       Assessment   Medical Diagnosis O26.893,R10.2 (ICD-10-CM) - Pelvic pain affecting pregnancy in third trimester, antepartum    Referring Provider (PT) Ephraim Hamburger, Rona Ravens, NP    Onset Date/Surgical Date --   2 weeks ago   Prior Therapy none      Precautions   Precautions None      Restrictions   Weight Bearing Restrictions Yes      Balance Screen   Has the patient fallen in the past 6 months No    Has the patient had a decrease in activity level because of a fear of falling?  No    Is the patient reluctant to leave their home because of a fear of falling?  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

## 2020-09-11 ENCOUNTER — Telehealth: Payer: Self-pay | Admitting: Obstetrics & Gynecology

## 2020-09-11 ENCOUNTER — Inpatient Hospital Stay (HOSPITAL_COMMUNITY)
Admission: AD | Admit: 2020-09-11 | Discharge: 2020-09-16 | DRG: 807 | Disposition: A | Discharge status: Home / Self Care | Payer: Medicaid Other | Attending: Obstetrics and Gynecology | Admitting: Obstetrics and Gynecology

## 2020-09-11 ENCOUNTER — Other Ambulatory Visit: Payer: Self-pay

## 2020-09-11 DIAGNOSIS — D509 Iron deficiency anemia, unspecified: Secondary | ICD-10-CM | POA: Diagnosis present

## 2020-09-11 DIAGNOSIS — R0602 Shortness of breath: Secondary | ICD-10-CM

## 2020-09-11 DIAGNOSIS — Z87891 Personal history of nicotine dependence: Secondary | ICD-10-CM

## 2020-09-11 DIAGNOSIS — O9902 Anemia complicating childbirth: Secondary | ICD-10-CM | POA: Diagnosis present

## 2020-09-11 DIAGNOSIS — O1490 Unspecified pre-eclampsia, unspecified trimester: Secondary | ICD-10-CM | POA: Diagnosis present

## 2020-09-11 DIAGNOSIS — J45909 Unspecified asthma, uncomplicated: Secondary | ICD-10-CM | POA: Diagnosis present

## 2020-09-11 DIAGNOSIS — D563 Thalassemia minor: Secondary | ICD-10-CM | POA: Diagnosis present

## 2020-09-11 DIAGNOSIS — O99344 Other mental disorders complicating childbirth: Secondary | ICD-10-CM | POA: Diagnosis present

## 2020-09-11 DIAGNOSIS — F419 Anxiety disorder, unspecified: Secondary | ICD-10-CM | POA: Diagnosis present

## 2020-09-11 DIAGNOSIS — Z3A34 34 weeks gestation of pregnancy: Secondary | ICD-10-CM

## 2020-09-11 DIAGNOSIS — O99019 Anemia complicating pregnancy, unspecified trimester: Secondary | ICD-10-CM | POA: Diagnosis present

## 2020-09-11 DIAGNOSIS — O9952 Diseases of the respiratory system complicating childbirth: Secondary | ICD-10-CM | POA: Diagnosis present

## 2020-09-11 DIAGNOSIS — O1414 Severe pre-eclampsia complicating childbirth: Principal | ICD-10-CM | POA: Diagnosis present

## 2020-09-11 NOTE — MAU Note (Signed)
Tonight took her b/p and it was high. Also has a headache and blurry vision. Having pain in mid to R abdomen for 30 mins. Denies VB or LOF. Baby has not moved as much as usual today

## 2020-09-12 ENCOUNTER — Encounter (HOSPITAL_COMMUNITY): Payer: Self-pay | Admitting: Obstetrics & Gynecology

## 2020-09-12 ENCOUNTER — Encounter: Payer: Medicaid Other | Admitting: Certified Nurse Midwife

## 2020-09-12 DIAGNOSIS — R030 Elevated blood-pressure reading, without diagnosis of hypertension: Secondary | ICD-10-CM | POA: Diagnosis not present

## 2020-09-12 DIAGNOSIS — R0602 Shortness of breath: Secondary | ICD-10-CM | POA: Diagnosis not present

## 2020-09-12 DIAGNOSIS — D509 Iron deficiency anemia, unspecified: Secondary | ICD-10-CM | POA: Diagnosis present

## 2020-09-12 DIAGNOSIS — O1493 Unspecified pre-eclampsia, third trimester: Secondary | ICD-10-CM

## 2020-09-12 DIAGNOSIS — Z87891 Personal history of nicotine dependence: Secondary | ICD-10-CM | POA: Diagnosis not present

## 2020-09-12 DIAGNOSIS — O9902 Anemia complicating childbirth: Secondary | ICD-10-CM

## 2020-09-12 DIAGNOSIS — F419 Anxiety disorder, unspecified: Secondary | ICD-10-CM | POA: Diagnosis present

## 2020-09-12 DIAGNOSIS — O9952 Diseases of the respiratory system complicating childbirth: Secondary | ICD-10-CM | POA: Diagnosis present

## 2020-09-12 DIAGNOSIS — O99344 Other mental disorders complicating childbirth: Secondary | ICD-10-CM | POA: Diagnosis present

## 2020-09-12 DIAGNOSIS — J45909 Unspecified asthma, uncomplicated: Secondary | ICD-10-CM | POA: Diagnosis present

## 2020-09-12 DIAGNOSIS — O1490 Unspecified pre-eclampsia, unspecified trimester: Secondary | ICD-10-CM | POA: Diagnosis present

## 2020-09-12 DIAGNOSIS — Z3A34 34 weeks gestation of pregnancy: Secondary | ICD-10-CM

## 2020-09-12 DIAGNOSIS — O1414 Severe pre-eclampsia complicating childbirth: Secondary | ICD-10-CM

## 2020-09-12 DIAGNOSIS — D563 Thalassemia minor: Secondary | ICD-10-CM | POA: Diagnosis present

## 2020-09-12 LAB — COMPREHENSIVE METABOLIC PANEL
ALT: 7 U/L (ref 0–44)
ALT: 9 U/L (ref 0–44)
AST: 14 U/L — ABNORMAL LOW (ref 15–41)
AST: 16 U/L (ref 15–41)
Albumin: 2.5 g/dL — ABNORMAL LOW (ref 3.5–5.0)
Albumin: 2.6 g/dL — ABNORMAL LOW (ref 3.5–5.0)
Alkaline Phosphatase: 64 U/L (ref 38–126)
Alkaline Phosphatase: 60 U/L (ref 38–126)
Anion gap: 7 (ref 5–15)
Anion gap: 7 (ref 5–15)
BUN: 7 mg/dL (ref 6–20)
BUN: 8 mg/dL (ref 6–20)
CO2: 19 mmol/L — ABNORMAL LOW (ref 22–32)
CO2: 19 mmol/L — ABNORMAL LOW (ref 22–32)
Calcium: 8.5 mg/dL — ABNORMAL LOW (ref 8.9–10.3)
Calcium: 8.1 mg/dL — ABNORMAL LOW (ref 8.9–10.3)
Chloride: 108 mmol/L (ref 98–111)
Chloride: 105 mmol/L (ref 98–111)
Creatinine, Ser: 0.56 mg/dL (ref 0.44–1.00)
Creatinine, Ser: 0.56 mg/dL (ref 0.44–1.00)
GFR, Estimated: >60 mL/min (ref >60)
GFR, Estimated: >60 mL/min (ref >60)
Glucose, Bld: 104 mg/dL — ABNORMAL HIGH (ref 70–99)
Glucose, Bld: 126 mg/dL — ABNORMAL HIGH (ref 70–99)
Potassium: 3.9 mmol/L (ref 3.5–5.1)
Potassium: 4.5 mmol/L (ref 3.5–5.1)
Sodium: 134 mmol/L — ABNORMAL LOW (ref 135–145)
Sodium: 131 mmol/L — ABNORMAL LOW (ref 135–145)
Total Bilirubin: 0.3 mg/dL (ref 0.3–1.2)
Total Bilirubin: 0.6 mg/dL (ref 0.3–1.2)
Total Protein: 5.7 g/dL — ABNORMAL LOW (ref 6.5–8.1)
Total Protein: 6.2 g/dL — ABNORMAL LOW (ref 6.5–8.1)

## 2020-09-12 LAB — CBC
HCT: 25.8 % — ABNORMAL LOW (ref 36.0–46.0)
HCT: 27.7 % — ABNORMAL LOW (ref 36.0–46.0)
Hemoglobin: 8.4 g/dL — ABNORMAL LOW (ref 12.0–15.0)
Hemoglobin: 9.1 g/dL — ABNORMAL LOW (ref 12.0–15.0)
MCH: 26.4 pg (ref 26.0–34.0)
MCH: 26.6 pg (ref 26.0–34.0)
MCHC: 32.6 g/dL (ref 30.0–36.0)
MCHC: 32.9 g/dL (ref 30.0–36.0)
MCV: 81.1 fL (ref 80.0–100.0)
MCV: 81 fL (ref 80.0–100.0)
Platelets: 261 10*3/uL (ref 150–400)
Platelets: 296 10*3/uL (ref 150–400)
RBC: 3.18 MIL/uL — ABNORMAL LOW (ref 3.87–5.11)
RBC: 3.42 MIL/uL — ABNORMAL LOW (ref 3.87–5.11)
RDW: 13 % (ref 11.5–15.5)
RDW: 13.2 % (ref 11.5–15.5)
WBC: 6.2 10*3/uL (ref 4.0–10.5)
WBC: 7.5 10*3/uL (ref 4.0–10.5)
nRBC: 0 % (ref 0.0–0.2)
nRBC: 0 % (ref 0.0–0.2)

## 2020-09-12 LAB — URINALYSIS, ROUTINE W REFLEX MICROSCOPIC
Bilirubin Urine: NEGATIVE
Glucose, UA: NEGATIVE mg/dL
Hgb urine dipstick: NEGATIVE
Ketones, ur: NEGATIVE mg/dL
Nitrite: NEGATIVE
Protein, ur: NEGATIVE mg/dL
Specific Gravity, Urine: 1.008 (ref 1.005–1.030)
pH: 7 (ref 5.0–8.0)

## 2020-09-12 LAB — TYPE AND SCREEN
ABO/RH(D): B POS
Antibody Screen: NEGATIVE

## 2020-09-12 LAB — PROTEIN / CREATININE RATIO, URINE
Creatinine, Urine: 59.62 mg/dL
Protein Creatinine Ratio: 0.15 mg/mg{Cre} (ref 0.00–0.15)
Total Protein, Urine: 9 mg/dL

## 2020-09-12 MED ORDER — FENTANYL CITRATE (PF) 100 MCG/2ML IJ SOLN
50.0000 ug | INTRAMUSCULAR | Status: DC | PRN
  Administered 2020-09-12: 100 ug via INTRAVENOUS
  Administered 2020-09-12 (×2): 50 ug via INTRAVENOUS
  Filled 2020-09-12 (×2): qty 2

## 2020-09-12 MED ORDER — SOD CITRATE-CITRIC ACID 500-334 MG/5ML PO SOLN: 30.0000 mL | ORAL | Status: DC | PRN

## 2020-09-12 MED ORDER — ZOLPIDEM TARTRATE 5 MG PO TABS
5.0000 mg | ORAL_TABLET | Freq: Every evening | ORAL | Status: DC | PRN
Start: 2020-09-12 — End: 2020-09-12

## 2020-09-12 MED ORDER — OXYTOCIN BOLUS FROM INFUSION
333.0000 mL | Freq: Once | INTRAVENOUS | Status: AC
  Administered 2020-09-12: 333 mL via INTRAVENOUS

## 2020-09-12 MED ORDER — PENICILLIN G POT IN DEXTROSE 60000 UNIT/ML IV SOLN
3.0000 10*6.[IU] | INTRAVENOUS | Status: DC
  Administered 2020-09-12: 3 10*6.[IU] via INTRAVENOUS
  Filled 2020-09-12: qty 50

## 2020-09-12 MED ORDER — MAGNESIUM SULFATE BOLUS VIA INFUSION
4.0000 g | Freq: Once | INTRAVENOUS | Status: AC
  Administered 2020-09-12: 4 g via INTRAVENOUS
  Filled 2020-09-12: qty 1000

## 2020-09-12 MED ORDER — OXYCODONE-ACETAMINOPHEN 5-325 MG PO TABS
2.0000 | ORAL_TABLET | Freq: Once | ORAL | Status: AC
  Administered 2020-09-12: 2 via ORAL
  Filled 2020-09-12: qty 2

## 2020-09-12 MED ORDER — LABETALOL HCL 5 MG/ML IV SOLN: 80.0000 mg | INTRAVENOUS | Status: DC | PRN

## 2020-09-12 MED ORDER — MAGNESIUM SULFATE 40 GM/1000ML IV SOLN
2.0000 g/h | INTRAVENOUS | Status: DC
  Administered 2020-09-12: 2 g/h via INTRAVENOUS
  Filled 2020-09-12: qty 1000

## 2020-09-12 MED ORDER — LIDOCAINE HCL (PF) 1 % IJ SOLN: 30.0000 mL | INTRAMUSCULAR | Status: DC | PRN

## 2020-09-12 MED ORDER — BUTALBITAL-APAP-CAFFEINE 50-325-40 MG PO TABS
2.0000 | ORAL_TABLET | Freq: Once | ORAL | Status: AC
  Administered 2020-09-12: 2 via ORAL
  Filled 2020-09-12: qty 2

## 2020-09-12 MED ORDER — ACETAMINOPHEN 325 MG PO TABS: 650.0000 mg | ORAL_TABLET | ORAL | Status: DC | PRN

## 2020-09-12 MED ORDER — LACTATED RINGERS IV SOLN: INTRAVENOUS | Status: DC

## 2020-09-12 MED ORDER — LABETALOL HCL 5 MG/ML IV SOLN: 40.0000 mg | INTRAVENOUS | Status: DC | PRN

## 2020-09-12 MED ORDER — OXYTOCIN-SODIUM CHLORIDE 30-0.9 UT/500ML-% IV SOLN
2.5000 [IU]/h | INTRAVENOUS | Status: DC
  Filled 2020-09-12: qty 500

## 2020-09-12 MED ORDER — MISOPROSTOL 50MCG HALF TABLET
50.0000 ug | ORAL_TABLET | ORAL | Status: DC | PRN
  Administered 2020-09-12: 50 ug via BUCCAL
  Filled 2020-09-12: qty 1

## 2020-09-12 MED ORDER — LABETALOL HCL 5 MG/ML IV SOLN: 20.0000 mg | INTRAVENOUS | Status: DC | PRN

## 2020-09-12 MED ORDER — BUTALBITAL-APAP-CAFFEINE 50-325-40 MG PO TABS
2.0000 | ORAL_TABLET | Freq: Once | ORAL | Status: AC
  Administered 2020-09-12: 2 via ORAL
  Filled 2020-09-12 (×2): qty 2

## 2020-09-12 MED ORDER — BETAMETHASONE SOD PHOS & ACET 6 (3-3) MG/ML IJ SUSP
12.0000 mg | INTRAMUSCULAR | Status: DC
  Administered 2020-09-12: 12 mg via INTRAMUSCULAR
  Filled 2020-09-12: qty 5

## 2020-09-12 MED ORDER — ACETAMINOPHEN 500 MG PO TABS: 1000.0000 mg | ORAL_TABLET | Freq: Once | ORAL | Status: DC

## 2020-09-12 MED ORDER — OXYCODONE-ACETAMINOPHEN 5-325 MG PO TABS: 1.0000 | ORAL_TABLET | ORAL | Status: DC | PRN

## 2020-09-12 MED ORDER — CALCIUM CARBONATE ANTACID 500 MG PO CHEW: 2.0000 | CHEWABLE_TABLET | ORAL | Status: DC | PRN

## 2020-09-12 MED ORDER — PRENATAL MULTIVITAMIN CH
1.0000 | ORAL_TABLET | Freq: Every day | ORAL | Status: DC
  Administered 2020-09-12: 1 via ORAL
  Filled 2020-09-12: qty 1

## 2020-09-12 MED ORDER — ACETAMINOPHEN 325 MG PO TABS
650.0000 mg | ORAL_TABLET | ORAL | Status: DC | PRN
  Administered 2020-09-12: 650 mg via ORAL
  Filled 2020-09-12: qty 2

## 2020-09-12 MED ORDER — SODIUM CHLORIDE 0.9 % IV SOLN
5.0000 10*6.[IU] | Freq: Once | INTRAVENOUS | Status: AC
  Administered 2020-09-12: 5 10*6.[IU] via INTRAVENOUS
  Filled 2020-09-12: qty 5

## 2020-09-12 MED ORDER — DOCUSATE SODIUM 100 MG PO CAPS
100.0000 mg | ORAL_CAPSULE | Freq: Every day | ORAL | Status: DC
Start: 2020-09-12 — End: 2020-09-12
  Administered 2020-09-12: 100 mg via ORAL
  Filled 2020-09-12: qty 1

## 2020-09-12 MED ORDER — OXYCODONE-ACETAMINOPHEN 5-325 MG PO TABS: 2.0000 | ORAL_TABLET | ORAL | Status: DC | PRN

## 2020-09-12 MED ORDER — HYDROMORPHONE HCL 1 MG/ML IJ SOLN
1.0000 mg | Freq: Once | INTRAMUSCULAR | Status: AC
  Administered 2020-09-12: 1 mg via INTRAVENOUS
  Filled 2020-09-12: qty 1

## 2020-09-12 MED ORDER — ONDANSETRON HCL 4 MG/2ML IJ SOLN: 4.0000 mg | Freq: Four times a day (QID) | INTRAMUSCULAR | Status: DC | PRN

## 2020-09-12 MED ORDER — LACTATED RINGERS IV SOLN: 500.0000 mL | INTRAVENOUS | Status: DC | PRN

## 2020-09-12 MED ORDER — TRAMADOL HCL 50 MG PO TABS
50.0000 mg | ORAL_TABLET | Freq: Four times a day (QID) | ORAL | Status: DC | PRN
  Administered 2020-09-12: 50 mg via ORAL
  Filled 2020-09-12: qty 1

## 2020-09-12 NOTE — Progress Notes (Signed)
Labor Progress Note Heather Malone is a 30 y.o. G2P1001 at [redacted]w[redacted]d presented for IOL due to pre-eclampsia   S: Doing well, feeling contractions more. Foley bulb just came out.   O:  BP 120/67   Pulse 73   Temp 97.8 F (36.6 C) (Oral)   Resp 16   Ht 5\' 2"  (1.575 m)   Wt 77.1 kg   LMP 01/11/2020   SpO2 97%   BMI 31.09 kg/m  EFM: 115/mod/15x15/no decels   CVE: Dilation: 5.5 Effacement (%): 70 Cervical Position: Middle Station: -2 Presentation: Vertex Exam by:: stone rnc   A&P: 30 y.o. G2P1001 [redacted]w[redacted]d. #Labor: Progressing well, contracting frequently (and feeling them) since FB came out. Will expectantly manage for now, if spacing will add pit 2x2.  #Pain: PRN IV fent, would like to hold off from an epidural if possible  #FWB: Cat 1 #GBS unknown but PCN for pre-term   #Pre-e with severe features: Headache still intermittently present, offered tylenol in addition to IV fent. No severe range Bps. Pre-e labs WNL. Will continue Mg and monitor labs q12/as needed.    #Pre-term induction: S/p BMZ x1. Will be able to give second BMZ if still pregnant on 8/18 3:00am. Consulted with neonatology team, will have present at delivery.  #Anemia: Hgb 8.4 on admit, should have pp CBC.  9/18, DO 7:07 PM

## 2020-09-12 NOTE — Progress Notes (Signed)
Labor Progress Note Heather Malone is a 30 y.o. G2P1001 at [redacted]w[redacted]d presented for IOL due to pre-eclampsia. S: Patient indicates having pain with contractions.  Declining epidural.  O:  BP 130/76   Pulse 73   Temp 97.8 F (36.6 C) (Oral)   Resp 17   Ht 5\' 2"  (1.575 m)   Wt 77.1 kg   LMP 01/11/2020   SpO2 97%   BMI 31.09 kg/m  EFM: 140/Moderate/- Accels, - Deccels  CVE: Dilation: 6.5 Effacement (%): 70 Cervical Position: Middle Station: 0 Presentation: Vertex Exam by:: Tiffany Yox   A&P: 30 y.o. G2P1001 [redacted]w[redacted]d resented for IOL due to pre-eclampsia. #Labor: Progressing well.  Passed 2-3, 2 cm clots.  Pain with contractions but not significantly increased from before  Hx of Uterine Abruption.  Fetal Monitoring reassuring.  Will continue to monitor for bleeding closely. #Pain: PRN IV Fentanyl #FWB: Cat 1 #GBS  Unknown, PCN for pre-term #Pre-e with severe features: Headache still intermittently present, offered tylenol in addition to IV fent. No severe range Bps. Pre-e labs WNL. Will continue Mg and monitor labs q12/as needed. #Pre-term induction: S/p BMZ x1. Will be able to give second BMZ if still pregnant on 8/18 3:00am. Consulted with neonatology team, will have present at delivery. #Anemia: Hgb 8.4 on admit, should have pp CBC & IV Venofer  9/18, MD 8:46 PM

## 2020-09-12 NOTE — MAU Note (Signed)
PT SAYS AT 1030-  - HAD H/A- DID NOT TAKE  ANY MEDS. HAS HX OF MIGRAINES- NO MEDS LAST H/A- Sunday - TOOK MIGRAINE MED- H/A WENT AWAY  THEN ATE SNACK-  1045PM- BLURRED VISION- TOOK BP -  THEN DR CALLED PT - TOLD TO COME HERE.   STILL HAS H/A HAS BLURRY VISION HAS PAIN ON RIGHT SIDE OF ABD- STARTED AT 2330- SAME AS WAS AT HOME.

## 2020-09-12 NOTE — Progress Notes (Signed)
FACULTY PRACTICE ANTEPARTUM COMPREHENSIVE PROGRESS NOTE  Heather Malone is a 30 y.o. G2P1001 at [redacted]w[redacted]d who is admitted for suspected PreE with severe features with HA.  Estimated Date of Delivery: 10/24/20 Fetal presentation is cephalic.  Length of Stay:  0 Days. Admitted 09/11/2020  Subjective: Heather Malone presented with new onset elevated BP, HA and blurred vision. She was given fiorcet without relief. It feels different than her usual Has. She also noted RUQ pain.   Patient reports good fetal movement.  She reports no uterine contractions, no bleeding and no loss of fluid per vagina.  Vitals:  Blood pressure (!) 132/97, pulse 88, temperature 97.6 F (36.4 C), temperature source Axillary, resp. rate 18, height 5\' 2"  (1.575 m), weight 77.1 kg, last menstrual period 01/11/2020, SpO2 99 %, unknown if currently breastfeeding. Physical Examination: CONSTITUTIONAL: Well-developed, well-nourished female in no acute distress.  NEUROLOGIC: Alert and oriented to person, place, and time. No cranial nerve deficit noted. PSYCHIATRIC: Normal mood and affect. Normal behavior. Normal judgment and thought content. CARDIOVASCULAR: Normal heart rate noted, regular rhythm RESPIRATORY: Effort and breath sounds normal, no problems with respiration noted MUSCULOSKELETAL: Normal range of motion. No edema and no tenderness. 2+ distal pulses. ABDOMEN: Soft, nontender, nondistended, gravid. CERVIX: Dilation: 1 Effacement (%): 30 Cervical Position: Middle Station: Ballotable Presentation: Vertex Exam by:: [email or web address] CNM  Fetal monitoring: FHR: 130 bpm, Variability: moderate, Accelerations: Present, Decelerations: Absent  Uterine activity: no contractions per hour  Results for orders placed or performed during the hospital encounter of 09/11/20 (from the past 48 hour(s))  Urinalysis, Routine w reflex microscopic Urine, Clean Catch     Status: Abnormal   Collection Time: 09/11/20 11:53 PM  Result Value Ref  Range   Color, Urine STRAW (A) YELLOW   APPearance HAZY (A) CLEAR   Specific Gravity, Urine 1.008 1.005 - 1.030   pH 7.0 5.0 - 8.0   Glucose, UA NEGATIVE NEGATIVE mg/dL   Hgb urine dipstick NEGATIVE NEGATIVE   Bilirubin Urine NEGATIVE NEGATIVE   Ketones, ur NEGATIVE NEGATIVE mg/dL   Protein, ur NEGATIVE NEGATIVE mg/dL   Nitrite NEGATIVE NEGATIVE   Leukocytes,Ua TRACE (A) NEGATIVE   RBC / HPF 0-5 0 - 5 RBC/hpf   WBC, UA 0-5 0 - 5 WBC/hpf   Bacteria, UA RARE (A) NONE SEEN   Squamous Epithelial / LPF 6-10 0 - 5    Comment: Performed at [facility], [address] [place], [place]  Protein / creatinine ratio, urine     Status: None   Collection Time: 09/11/20 11:57 PM  Result Value Ref Range   Creatinine, Urine 59.62 mg/dL   Total Protein, Urine 9 mg/dL    Comment: NO NORMAL RANGE ESTABLISHED FOR THIS TEST   Protein Creatinine Ratio 0.15 0.00 - 0.15 mg/mg[Cre]    Comment: Performed at [facility], [address], [place], [place]  CBC     Status: Abnormal   Collection Time: 09/12/20 12:30 AM  Result Value Ref Range   WBC 6.2 4.0 - 10.5 K/uL   RBC 3.18 (L) 3.87 - 5.11 MIL/uL   Hemoglobin 8.4 (L) 12.0 - 15.0 g/dL   HCT 09/14/20 (L) 71.0 - 62.6 %   MCV 81.1 80.0 - 100.0 fL   MCH 26.4 26.0 - 34.0 pg   MCHC 32.6 30.0 - 36.0 g/dL   RDW 94.8 54.6 - 27.0 %   Platelets 261 150 - 400 K/uL   nRBC 0.0 0.0 - 0.2 %  Comment: Performed at [facility], [address], [place], [place] [address]  Comprehensive metabolic panel     Status: Abnormal   Collection Time: 09/12/20 12:30 AM  Result Value Ref Range   Sodium 134 (L) 135 - 145 mmol/L   Potassium 3.9 3.5 - 5.1 mmol/L   Chloride 108 98 - 111 mmol/L   CO2 19 (L) 22 - 32 mmol/L   Glucose, Bld 104 (H) 70 - 99 mg/dL    Comment: Glucose reference range applies only to samples taken after fasting for at least 8 hours.   BUN 7 6 - 20 mg/dL   Creatinine, Ser 3.00 0.44 - 1.00 mg/dL   Calcium 8.5 (L) 8.9  - 10.3 mg/dL   Total Protein 5.7 (L) 6.5 - 8.1 g/dL   Albumin 2.5 (L) 3.5 - 5.0 g/dL   AST 14 (L) 15 - 41 U/L   ALT 7 0 - 44 U/L   Alkaline Phosphatase 64 38 - 126 U/L   Total Bilirubin 0.3 0.3 - 1.2 mg/dL   GFR, Estimated >92 >33 mL/min    Comment: (NOTE) Calculated using the CKD-EPI Creatinine Equation (2021)    Anion gap 7 5 - 15    Comment: Performed at [facility], [address], [place], [place] [address]  Type and screen [facility]     Status: None   Collection Time: 09/12/20  3:36 AM  Result Value Ref Range   ABO/RH(D) B POS    Antibody Screen NEG    Sample Expiration      09/15/2020,2359 Performed at [facility], [address], [place], [place] [address]     No results found.  Current scheduled medications  betamethasone acetate-betamethasone sodium phosphate  12 mg Intramuscular Q24H   docusate sodium  100 mg Oral Daily   prenatal multivitamin  1 tablet Oral Q1200    I have reviewed the patient's current medications.  ASSESSMENT: Active Problems:   Preeclampsia   PLAN: Severe PreE with severe Features (HA/BV) - Reviewed diagnosis and risks to her and baby. Recommended delivery at 34 weeks via induction of labor. Discussed general information of the process - Discussed Magnesium in labor and for the 24 hours after delivery - Discussed potential need for anti-HTN medication  FWB - Anatomy showed possible abnormal cardiac anatomy but f/u fetal echo c/w normal variant - NST reactive - BMZ given but due to symptoms and gestational age, would not delay delivery for this purpose  - Will have pt see NICU since prior delivery was FT and uncomplicated and we anticipate the baby will be in the NICU  3. Routine - Plan is for PPTL - Plans to breast feed - Rh pos, Rubella immune - GBS unknown and preterm - will need prophylaxis  Milas Hock, MD, FACOG Obstetrician & Gynecologist, [facility] for [facility],  [facility]

## 2020-09-12 NOTE — MAU Provider Note (Signed)
Chief Complaint:  Hypertension, Headache, and Abdominal Pain   Event Date/Time   First Provider Initiated Contact with Patient 09/12/20 0017     HPI: Heather Malone is a 30 y.o. G2P1001 at 53w0dwho presents to maternity admissions reporting elevated BP, headache, blurred vision and RUQ pain.Did not take anything for her headache tonight.  . She reports good fetal movement, denies LOF, vaginal bleeding, vaginal itching/burning, urinary symptoms, h/a, dizziness, n/v, diarrhea, constipation or fever/chills.    Hypertension This is a recurrent problem. Associated symptoms include blurred vision, headaches and peripheral edema. Pertinent negatives include no anxiety, chest pain or shortness of breath. There are no associated agents to hypertension. There are no known risk factors for coronary artery disease. Past treatments include nothing. There are no compliance problems.   Headache  This is a new problem. The current episode started today. The problem occurs constantly. The problem has been unchanged. The pain does not radiate. The quality of the pain is described as aching. Associated symptoms include abdominal pain, blurred vision and a visual change. Pertinent negatives include no coughing, fever, muscle aches or sinus pressure. Nothing aggravates the symptoms. She has tried nothing for the symptoms. Her past medical history is significant for hypertension.  Abdominal Pain This is a new problem. The problem occurs constantly. The pain is located in the RLQ. The quality of the pain is cramping. The abdominal pain does not radiate. Associated symptoms include headaches. Pertinent negatives include no fever. Nothing aggravates the pain. The pain is relieved by Nothing. She has tried nothing for the symptoms.   RN Note: Tonight took her b/p and it was high. Also has a headache and blurry vision. Having pain in mid to R abdomen for 30 mins. Denies VB or LOF. Baby has not moved as much as usual  today  Past Medical History: Past Medical History:  Diagnosis Date   Anemia    Anxiety    Asthma    albuterol prn   Headache    migraines   Hx of trichomoniasis    Hx of varicella    Neck injury, subsequent encounter 07/26/2016   Placental abruption in third trimester 09/04/2014   Subchorionic hemorrhage in third trimester 07/26/2014   Velamentous insertion of umbilical cord     Past obstetric history: OB History  Gravida Para Term Preterm AB Living  2 1 1  0 0 1  SAB IAB Ectopic Multiple Live Births  0 0 0 0 1    # Outcome Date GA Lbr Len/2nd Weight Sex Delivery Anes PTL Lv  2 Current           1 Term 09/05/14 [redacted]w[redacted]d 1578:09 / 00:10 3340 g M Vag-Spont None  LIV    Past Surgical History: Past Surgical History:  Procedure Laterality Date   TONSILLECTOMY      Family History: Family History  Problem Relation Age of Onset   Hypertension Mother    Hypertension Maternal Grandmother    Heart attack Maternal Grandmother    Hypertension Maternal Grandfather    Cancer Maternal Grandfather    Diabetes Paternal Grandmother     Social History: Social History   Tobacco Use   Smoking status: Former    Types: Cigars   Smokeless tobacco: Never  Vaping Use   Vaping Use: Never used  Substance Use Topics   Alcohol use: Not Currently   Drug use: Not Currently    Types: Marijuana    Comment: none with + UPT  Allergies: No Known Allergies  Meds:  Medications Prior to Admission  Medication Sig Dispense Refill Last Dose   iron polysaccharides (NIFEREX) 150 MG capsule Take 1 capsule (150 mg total) by mouth daily. 30 capsule 2 09/11/2020   omeprazole (PRILOSEC) 20 MG capsule Take 1 capsule (20 mg total) by mouth daily. 60 capsule 2 Past Week   ondansetron (ZOFRAN ODT) 4 MG disintegrating tablet Take 1 tablet (4 mg total) by mouth every 8 (eight) hours as needed for nausea or vomiting. 30 tablet 1 Past Week   Prenatal Vit-Fe Fumarate-FA (PRENATAL MULTIVITAMIN) TABS tablet Take  1 tablet by mouth daily at 12 noon.   09/11/2020   promethazine (PHENERGAN) 25 MG tablet Take 1 tablet (25 mg total) by mouth every 6 (six) hours as needed for nausea or vomiting. 30 tablet 1 Past Week    I have reviewed patient's Past Medical Hx, Surgical Hx, Family Hx, Social Hx, medications and allergies.   ROS:  Review of Systems  Constitutional:  Negative for fever.  HENT:  Negative for sinus pressure.   Eyes:  Positive for blurred vision.  Respiratory:  Negative for cough and shortness of breath.   Cardiovascular:  Negative for chest pain.  Gastrointestinal:  Positive for abdominal pain.  Neurological:  Positive for headaches.  Other systems negative  Physical Exam  Patient Vitals for the past 24 hrs:  BP Temp Pulse Resp SpO2 Height Weight  09/11/20 2351 (!) 146/89 97.9 F (36.6 C) 70 18 100 % -- --  09/11/20 2350 -- -- -- -- -- 5\' 2"  (1.575 m) 77.1 kg   Vitals:   09/12/20 0101 09/12/20 0104 09/12/20 0116 09/12/20 0131  BP: (!) 155/102 (!) 156/95 (!) 131/98 139/89  Pulse: 77 63 80 67  Resp:      Temp:      SpO2:      Weight:      Height:       Vitals:   09/12/20 0116 09/12/20 0131 09/12/20 0216 09/12/20 0231  BP: (!) 131/98 139/89 136/86 (!) 136/95  Pulse: 80 67 63 68  Resp:      Temp:      SpO2:      Weight:      Height:        Constitutional: Well-developed, well-nourished female in no acute distress.  Cardiovascular: normal rate and rhythm Respiratory: normal effort GI: Abd soft, non-tender, gravid appropriate for gestational age.   No rebound or guarding. MS: Extremities nontender, Trace to 1+ pedal edema, normal ROM Neurologic: Alert and oriented x 4.  DTRs 2+ with no clonus GU: Neg CVAT.  PELVIC EXAM:  Cervix 1cm/30/high  FHT:  Baseline 140 , moderate variability, accelerations present, no decelerations Contractions: Rare   Labs: Results for orders placed or performed during the hospital encounter of 09/11/20 (from the past 24 hour(s))   Urinalysis, Routine w reflex microscopic Urine, Clean Catch     Status: Abnormal   Collection Time: 09/11/20 11:53 PM  Result Value Ref Range   Color, Urine STRAW (A) YELLOW   APPearance HAZY (A) CLEAR   Specific Gravity, Urine 1.008 1.005 - 1.030   pH 7.0 5.0 - 8.0   Glucose, UA NEGATIVE NEGATIVE mg/dL   Hgb urine dipstick NEGATIVE NEGATIVE   Bilirubin Urine NEGATIVE NEGATIVE   Ketones, ur NEGATIVE NEGATIVE mg/dL   Protein, ur NEGATIVE NEGATIVE mg/dL   Nitrite NEGATIVE NEGATIVE   Leukocytes,Ua TRACE (A) NEGATIVE   RBC / HPF 0-5 0 - 5  RBC/hpf   WBC, UA 0-5 0 - 5 WBC/hpf   Bacteria, UA RARE (A) NONE SEEN   Squamous Epithelial / LPF 6-10 0 - 5  Protein / creatinine ratio, urine     Status: None   Collection Time: 09/11/20 11:57 PM  Result Value Ref Range   Creatinine, Urine 59.62 mg/dL   Total Protein, Urine 9 mg/dL   Protein Creatinine Ratio 0.15 0.00 - 0.15 mg/mg[Cre]  CBC     Status: Abnormal   Collection Time: 09/12/20 12:30 AM  Result Value Ref Range   WBC 6.2 4.0 - 10.5 K/uL   RBC 3.18 (L) 3.87 - 5.11 MIL/uL   Hemoglobin 8.4 (L) 12.0 - 15.0 g/dL   HCT 25.8 (L) 36.0 - 46.0 %   MCV 81.1 80.0 - 100.0 fL   MCH 26.4 26.0 - 34.0 pg   MCHC 32.6 30.0 - 36.0 g/dL   RDW 13.0 11.5 - 15.5 %   Platelets 261 150 - 400 K/uL   nRBC 0.0 0.0 - 0.2 %  Comprehensive metabolic panel     Status: Abnormal   Collection Time: 09/12/20 12:30 AM  Result Value Ref Range   Sodium 134 (L) 135 - 145 mmol/L   Potassium 3.9 3.5 - 5.1 mmol/L   Chloride 108 98 - 111 mmol/L   CO2 19 (L) 22 - 32 mmol/L   Glucose, Bld 104 (H) 70 - 99 mg/dL   BUN 7 6 - 20 mg/dL   Creatinine, Ser 0.56 0.44 - 1.00 mg/dL   Calcium 8.5 (L) 8.9 - 10.3 mg/dL   Total Protein 5.7 (L) 6.5 - 8.1 g/dL   Albumin 2.5 (L) 3.5 - 5.0 g/dL   AST 14 (L) 15 - 41 U/L   ALT 7 0 - 44 U/L   Alkaline Phosphatase 64 38 - 126 U/L   Total Bilirubin 0.3 0.3 - 1.2 mg/dL   GFR, Estimated >60 >60 mL/min   Anion gap 7 5 - 15     B/Positive/-- (03/17 1502)  Imaging:  No results found.  MAU Course/MDM: I have ordered labs and reviewed results. These are normal NST reviewed, reactive Consult Dr Arnold with presentation, exam findings and test results.  Treatments in MAU included Fioricet 2 tabs for headache which did not relieve headache..    Assessment: SIngle IUP at [redacted]w[redacted]d Gestational Hypertension with headache vs Severe preeclampsia  (normal PrCrRatio) History of chronic headaches Blurry vision Abdominal pain, no contractions seen. (No leukocytosis)  Plan: .Admit to Antenatal for observation Routine orders MD to follow  Nysir Fergusson CNM, MSN Certified Nurse-Midwife 09/12/2020 12:17 AM  

## 2020-09-12 NOTE — H&P (Signed)
Chief Complaint:  Hypertension, Headache, and Abdominal Pain   Event Date/Time   First Provider Initiated Contact with Patient 09/12/20 0017     HPI: Heather Malone is a 30 y.o. G2P1001 at 53w0dwho presents to maternity admissions reporting elevated BP, headache, blurred vision and RUQ pain.Did not take anything for her headache tonight.  . She reports good fetal movement, denies LOF, vaginal bleeding, vaginal itching/burning, urinary symptoms, h/a, dizziness, n/v, diarrhea, constipation or fever/chills.    Hypertension This is a recurrent problem. Associated symptoms include blurred vision, headaches and peripheral edema. Pertinent negatives include no anxiety, chest pain or shortness of breath. There are no associated agents to hypertension. There are no known risk factors for coronary artery disease. Past treatments include nothing. There are no compliance problems.   Headache  This is a new problem. The current episode started today. The problem occurs constantly. The problem has been unchanged. The pain does not radiate. The quality of the pain is described as aching. Associated symptoms include abdominal pain, blurred vision and a visual change. Pertinent negatives include no coughing, fever, muscle aches or sinus pressure. Nothing aggravates the symptoms. She has tried nothing for the symptoms. Her past medical history is significant for hypertension.  Abdominal Pain This is a new problem. The problem occurs constantly. The pain is located in the RLQ. The quality of the pain is cramping. The abdominal pain does not radiate. Associated symptoms include headaches. Pertinent negatives include no fever. Nothing aggravates the pain. The pain is relieved by Nothing. She has tried nothing for the symptoms.   RN Note: Tonight took her b/p and it was high. Also has a headache and blurry vision. Having pain in mid to R abdomen for 30 mins. Denies VB or LOF. Baby has not moved as much as usual  today  Past Medical History: Past Medical History:  Diagnosis Date   Anemia    Anxiety    Asthma    albuterol prn   Headache    migraines   Hx of trichomoniasis    Hx of varicella    Neck injury, subsequent encounter 07/26/2016   Placental abruption in third trimester 09/04/2014   Subchorionic hemorrhage in third trimester 07/26/2014   Velamentous insertion of umbilical cord     Past obstetric history: OB History  Gravida Para Term Preterm AB Living  2 1 1  0 0 1  SAB IAB Ectopic Multiple Live Births  0 0 0 0 1    # Outcome Date GA Lbr Len/2nd Weight Sex Delivery Anes PTL Lv  2 Current           1 Term 09/05/14 [redacted]w[redacted]d 1578:09 / 00:10 3340 g M Vag-Spont None  LIV    Past Surgical History: Past Surgical History:  Procedure Laterality Date   TONSILLECTOMY      Family History: Family History  Problem Relation Age of Onset   Hypertension Mother    Hypertension Maternal Grandmother    Heart attack Maternal Grandmother    Hypertension Maternal Grandfather    Cancer Maternal Grandfather    Diabetes Paternal Grandmother     Social History: Social History   Tobacco Use   Smoking status: Former    Types: Cigars   Smokeless tobacco: Never  Vaping Use   Vaping Use: Never used  Substance Use Topics   Alcohol use: Not Currently   Drug use: Not Currently    Types: Marijuana    Comment: none with + UPT  Allergies: No Known Allergies  Meds:  Medications Prior to Admission  Medication Sig Dispense Refill Last Dose   iron polysaccharides (NIFEREX) 150 MG capsule Take 1 capsule (150 mg total) by mouth daily. 30 capsule 2 09/11/2020   omeprazole (PRILOSEC) 20 MG capsule Take 1 capsule (20 mg total) by mouth daily. 60 capsule 2 Past Week   ondansetron (ZOFRAN ODT) 4 MG disintegrating tablet Take 1 tablet (4 mg total) by mouth every 8 (eight) hours as needed for nausea or vomiting. 30 tablet 1 Past Week   Prenatal Vit-Fe Fumarate-FA (PRENATAL MULTIVITAMIN) TABS tablet Take  1 tablet by mouth daily at 12 noon.   09/11/2020   promethazine (PHENERGAN) 25 MG tablet Take 1 tablet (25 mg total) by mouth every 6 (six) hours as needed for nausea or vomiting. 30 tablet 1 Past Week    I have reviewed patient's Past Medical Hx, Surgical Hx, Family Hx, Social Hx, medications and allergies.   ROS:  Review of Systems  Constitutional:  Negative for fever.  HENT:  Negative for sinus pressure.   Eyes:  Positive for blurred vision.  Respiratory:  Negative for cough and shortness of breath.   Cardiovascular:  Negative for chest pain.  Gastrointestinal:  Positive for abdominal pain.  Neurological:  Positive for headaches.  Other systems negative  Physical Exam  Patient Vitals for the past 24 hrs:  BP Temp Pulse Resp SpO2 Height Weight  09/11/20 2351 (!) 146/89 97.9 F (36.6 C) 70 18 100 % -- --  09/11/20 2350 -- -- -- -- -- 5\' 2"  (1.575 m) 77.1 kg   Vitals:   09/12/20 0101 09/12/20 0104 09/12/20 0116 09/12/20 0131  BP: (!) 155/102 (!) 156/95 (!) 131/98 139/89  Pulse: 77 63 80 67  Resp:      Temp:      SpO2:      Weight:      Height:       Vitals:   09/12/20 0116 09/12/20 0131 09/12/20 0216 09/12/20 0231  BP: (!) 131/98 139/89 136/86 (!) 136/95  Pulse: 80 67 63 68  Resp:      Temp:      SpO2:      Weight:      Height:        Constitutional: Well-developed, well-nourished female in no acute distress.  Cardiovascular: normal rate and rhythm Respiratory: normal effort GI: Abd soft, non-tender, gravid appropriate for gestational age.   No rebound or guarding. MS: Extremities nontender, Trace to 1+ pedal edema, normal ROM Neurologic: Alert and oriented x 4.  DTRs 2+ with no clonus GU: Neg CVAT.  PELVIC EXAM:  Cervix 1cm/30/high  FHT:  Baseline 140 , moderate variability, accelerations present, no decelerations Contractions: Rare   Labs: Results for orders placed or performed during the hospital encounter of 09/11/20 (from the past 24 hour(s))   Urinalysis, Routine w reflex microscopic Urine, Clean Catch     Status: Abnormal   Collection Time: 09/11/20 11:53 PM  Result Value Ref Range   Color, Urine STRAW (A) YELLOW   APPearance HAZY (A) CLEAR   Specific Gravity, Urine 1.008 1.005 - 1.030   pH 7.0 5.0 - 8.0   Glucose, UA NEGATIVE NEGATIVE mg/dL   Hgb urine dipstick NEGATIVE NEGATIVE   Bilirubin Urine NEGATIVE NEGATIVE   Ketones, ur NEGATIVE NEGATIVE mg/dL   Protein, ur NEGATIVE NEGATIVE mg/dL   Nitrite NEGATIVE NEGATIVE   Leukocytes,Ua TRACE (A) NEGATIVE   RBC / HPF 0-5 0 - 5  RBC/hpf   WBC, UA 0-5 0 - 5 WBC/hpf   Bacteria, UA RARE (A) NONE SEEN   Squamous Epithelial / LPF 6-10 0 - 5  Protein / creatinine ratio, urine     Status: None   Collection Time: 09/11/20 11:57 PM  Result Value Ref Range   Creatinine, Urine 59.62 mg/dL   Total Protein, Urine 9 mg/dL   Protein Creatinine Ratio 0.15 0.00 - 0.15 mg/mg[Cre]  CBC     Status: Abnormal   Collection Time: 09/12/20 12:30 AM  Result Value Ref Range   WBC 6.2 4.0 - 10.5 K/uL   RBC 3.18 (L) 3.87 - 5.11 MIL/uL   Hemoglobin 8.4 (L) 12.0 - 15.0 g/dL   HCT 19.3 (L) 79.0 - 24.0 %   MCV 81.1 80.0 - 100.0 fL   MCH 26.4 26.0 - 34.0 pg   MCHC 32.6 30.0 - 36.0 g/dL   RDW 97.3 53.2 - 99.2 %   Platelets 261 150 - 400 K/uL   nRBC 0.0 0.0 - 0.2 %  Comprehensive metabolic panel     Status: Abnormal   Collection Time: 09/12/20 12:30 AM  Result Value Ref Range   Sodium 134 (L) 135 - 145 mmol/L   Potassium 3.9 3.5 - 5.1 mmol/L   Chloride 108 98 - 111 mmol/L   CO2 19 (L) 22 - 32 mmol/L   Glucose, Bld 104 (H) 70 - 99 mg/dL   BUN 7 6 - 20 mg/dL   Creatinine, Ser 4.26 0.44 - 1.00 mg/dL   Calcium 8.5 (L) 8.9 - 10.3 mg/dL   Total Protein 5.7 (L) 6.5 - 8.1 g/dL   Albumin 2.5 (L) 3.5 - 5.0 g/dL   AST 14 (L) 15 - 41 U/L   ALT 7 0 - 44 U/L   Alkaline Phosphatase 64 38 - 126 U/L   Total Bilirubin 0.3 0.3 - 1.2 mg/dL   GFR, Estimated >83 >41 mL/min   Anion gap 7 5 - 15     B/Positive/-- (03/17 1502)  Imaging:  No results found.  MAU Course/MDM: I have ordered labs and reviewed results. These are normal NST reviewed, reactive Consult Dr Debroah Loop with presentation, exam findings and test results.  Treatments in MAU included Fioricet 2 tabs for headache which did not relieve headache..    Assessment: SIngle IUP at [redacted]w[redacted]d Gestational Hypertension with headache vs Severe preeclampsia  (normal PrCrRatio) History of chronic headaches Blurry vision Abdominal pain, no contractions seen. (No leukocytosis)  Plan: .Admit to Antenatal for observation Routine orders MD to follow  Wynelle Bourgeois CNM, MSN Certified Nurse-Midwife 09/12/2020 12:17 AM

## 2020-09-12 NOTE — Discharge Summary (Addendum)
Postpartum Discharge Summary     Patient Name: Heather Malone DOB: [birth year] MRN: [id number]  Date of admission: 09/11/2020 Delivery date:09/12/2020  Delivering provider: Worthy Rancher  Date of discharge: 09/16/20  Admitting diagnosis: Preeclampsia [O14.90] Vaginal delivery [O80] Intrauterine pregnancy: [redacted]w[redacted]d     Secondary diagnosis:  Principal Problem:   Vaginal delivery Active Problems:   Anemia in pregnancy   Preeclampsia with severe features   Additional problems: anxiety    Discharge diagnosis: Preterm Pregnancy Delivered                                              Post partum procedures: none Augmentation: AROM, Cytotec, and IP Foley Complications: None  Hospital course: Induction of Labor With Vaginal Delivery   30 y.o. yo G2P1001 at [redacted]w[redacted]d was admitted to the hospital 09/11/2020 for induction of labor.  Indication for induction: Preeclampsia.  Patient had an uncomplicated labor course as follows: Membrane Rupture Time/Date: 10:24 PM ,09/12/2020   Delivery Method:Vaginal, Spontaneous  Episiotomy: None  Lacerations:  None  Details of delivery can be found in separate delivery note.  Patient received IV magnesium x 24hr due to preeclampsia with severe features.  She was started on Procardia and Lasix for management- titrated to ProcardiaXL 90mg  daily and lisinopril.   On PPD#2, she was noted to report SOB/chest pain- full work up was completed.  While troponins were elevated, EKG and CXR were normal.  Symptoms resolved with rest and upon further questioning, it seems that this was a panic attack as she has had them in the past.  Pt started on zoloft 50mg  daily. Patient is discharged home 09/16/20  Newborn Data: Birth date:09/12/2020  Birth time:10:28 PM  Gender:Female  Living status:Living  Apgars:5 ,9  Weight:1900 g   Magnesium Sulfate received: Yes BMZ received: Yes Rhophylac:N/A MMR:N/A - Immune T-DaP:Given prenatally Flu: No Transfusion:No  Physical  exam  Vitals:   09/14/20 2013 09/14/20 2326 09/14/20 2327 09/15/20 0422  BP: 115/67 (!) 151/104 (!) 145/97 131/84  Pulse: 86 74 69 72  Resp: 17  18 16   Temp: 98.6 F (37 C)  98.6 F (37 C) 98.7 F (37.1 C)  TempSrc: Oral  Oral Oral  SpO2: 98%  97% 97%  Weight:      Height:       General: alert, cooperative, and no distress CV: RRR Lungs: CTAB Lochia: appropriate Uterine Fundus: firm Incision: N/A DVT Evaluation: No evidence of DVT seen on physical exam. Labs: Lab Results  Component Value Date   WBC 17.6 (H) 09/13/2020   HGB 8.8 (L) 09/13/2020   HCT 27.0 (L) 09/13/2020   MCV 82.1 09/13/2020   PLT 299 09/13/2020   CMP Latest Ref Rng & Units 09/13/2020  Glucose 70 - 99 mg/dL 440(N)  BUN 6 - 20 mg/dL <0(U)  Creatinine 7.25 - 1.00 mg/dL 3.66  Sodium 440 - 347 mmol/L 131(L)  Potassium 3.5 - 5.1 mmol/L 3.7  Chloride 98 - 111 mmol/L 101  CO2 22 - 32 mmol/L 20(L)  Calcium 8.9 - 10.3 mg/dL 7.4(L)  Total Protein 6.5 - 8.1 g/dL 4.2(V)  Total Bilirubin 0.3 - 1.2 mg/dL 9.5(G)  Alkaline Phos 38 - 126 U/L 56  AST 15 - 41 U/L 20  ALT 0 - 44 U/L 9   Edinburgh Score: Edinburgh Postnatal Depression Scale Screening Tool 09/14/2020  I  have been able to laugh and see the funny side of things. 0  I have looked forward with enjoyment to things. 0  I have blamed myself unnecessarily when things went wrong. 2  I have been anxious or worried for no good reason. 2  I have felt scared or panicky for no good reason. 2  Things have been getting on top of me. 2  I have been so unhappy that I have had difficulty sleeping. 0  I have felt sad or miserable. 2  I have been so unhappy that I have been crying. 1  The thought of harming myself has occurred to me. 0  Edinburgh Postnatal Depression Scale Total 11     After visit meds:  Allergies as of 09/15/2020   No Known Allergies      Medication List     STOP taking these medications    promethazine 25 MG tablet Commonly known as:  PHENERGAN       TAKE these medications    acetaminophen 325 MG tablet Commonly known as: Tylenol Take 2 tablets (650 mg total) by mouth every 6 (six) hours as needed for moderate pain (for pain scale < 4).   furosemide 20 MG tablet Commonly known as: Lasix Take 1 tablet (20 mg total) by mouth 2 (two) times daily.   ibuprofen 600 MG tablet Commonly known as: ADVIL Take 1 tablet (600 mg total) by mouth every 6 (six) hours.   iron polysaccharides 150 MG capsule Commonly known as: NIFEREX Take 1 capsule (150 mg total) by mouth daily.   NIFEdipine 90 MG 24 hr tablet Commonly known as: PROCARDIA XL/NIFEDICAL-XL Take 1 tablet (90 mg total) by mouth daily.   omeprazole 20 MG capsule Commonly known as: PRILOSEC Take 1 capsule (20 mg total) by mouth daily. What changed:  when to take this reasons to take this   ondansetron 4 MG disintegrating tablet Commonly known as: Zofran ODT Take 1 tablet (4 mg total) by mouth every 8 (eight) hours as needed for nausea or vomiting.   prenatal multivitamin Tabs tablet Take 1 tablet by mouth daily at 12 noon.   sertraline 50 MG tablet Commonly known as: ZOLOFT Take 1 tablet (50 mg total) by mouth daily.         Discharge home in stable condition Infant Feeding: Breast Infant Disposition:NICU Discharge instruction: per After Visit Summary and Postpartum booklet. Activity: Advance as tolerated. Pelvic rest for 6 weeks.  Diet: routine diet Future Appointments: Future Appointments  Date Time Provider Department Center  09/19/2020  9:00 AM [facility] NURSE [facility]  10/16/2020  3:15 PM Brand Males, CNM [facility]   Follow up Visit:  Follow-up Information     [facility] for [facility]. Schedule an appointment as soon as possible for a visit in 1 week(s).   Specialty: Obstetrics and Gynecology Why: Please make an appointment in one week for a BP check Contact information: [address] [place] [id number] [phone]               Message sent by Dr. Mathis Fare on 8/17 to [facility].  Please schedule this patient for a In person postpartum visit in 4 weeks with the following provider: Any provider. Additional Postpartum F/U: BP check 1 week  High risk pregnancy complicated by:  Preeclampsia; Preterm delivery Delivery mode:  Vaginal, Spontaneous  Anticipated Birth Control:  Unsure   Nettie Elm, MD

## 2020-09-12 NOTE — H&P (Signed)
OBSTETRIC ADMISSION HISTORY AND PHYSICAL  Heather Malone is a 30 y.o. female G2P1001 with IUP at [redacted]w[redacted]d by U/S presenting for IOL due to pre-eclampsia with severe features (HA , blurred vision, and RUQ abdominal pain). She reports +FMs, No LOF, and no VB.  She plans on breast feeding. She request postpartum BTL for birth control. She received her prenatal care at [facility]   Dating: By 8 week U/S --->  Estimated Date of Delivery: 10/24/20  Sono:    @[redacted]w[redacted]d , CWD, normal anatomy, cephalic presentation,  1090g, 48% EFW   Prenatal History/Complications:  --History of gestational hypertension, with newly diagnosed pre-e with severe features (admitted to antenatal overnight for observation and 1st dose of BMZ). Headache different than baseline, not improved with fiorcet.  --anemia of pregnancy (and likely iron deficiency): Hgb on admit 8.4 --Alpha thal silent carrier  --Fetal ventriculomegaly with left atrium appearing small on anatomy U/S, however fetal echo WNL   Past Medical History: Past Medical History:  Diagnosis Date   Anemia    Anxiety    Asthma    albuterol prn   Headache    migraines   Hx of trichomoniasis    Hx of varicella    Neck injury, subsequent encounter 07/26/2016   Placental abruption in third trimester 09/04/2014   Subchorionic hemorrhage in third trimester 07/26/2014   Velamentous insertion of umbilical cord     Past Surgical History: Past Surgical History:  Procedure Laterality Date   TONSILLECTOMY      Obstetrical History: OB History     Gravida  2   Para  1   Term  1   Preterm  0   AB  0   Living  1      SAB  0   IAB  0   Ectopic  0   Multiple  0   Live Births  1           Social History Social History   Socioeconomic History   Marital status: Single    Spouse name: Not on file   Number of children: Not on file   Years of education: Not on file   Highest education level: Not on file  Occupational History   Not on file   Tobacco Use   Smoking status: Former    Types: Cigars   Smokeless tobacco: Never  Vaping Use   Vaping Use: Never used  Substance and Sexual Activity   Alcohol use: Not Currently   Drug use: Not Currently    Types: Marijuana    Comment: none with + UPT   Sexual activity: Yes    Birth control/protection: None  Other Topics Concern   Not on file  Social History Narrative   Not on file   Social Determinants of Health   Financial Resource Strain: Not on file  Food Insecurity: No Food Insecurity   Worried About Running Out of Food in the Last Year: Never true   Ran Out of Food in the Last Year: Never true  Transportation Needs: No Transportation Needs   Lack of Transportation (Medical): No   Lack of Transportation (Non-Medical): No  Physical Activity: Not on file  Stress: Not on file  Social Connections: Not on file    Family History: Family History  Problem Relation Age of Onset   Hypertension Mother    Hypertension Maternal Grandmother    Heart attack Maternal Grandmother    Hypertension Maternal Grandfather    Cancer Maternal Grandfather  Diabetes Paternal Grandmother     Allergies: No Known Allergies  Medications Prior to Admission  Medication Sig Dispense Refill Last Dose   iron polysaccharides (NIFEREX) 150 MG capsule Take 1 capsule (150 mg total) by mouth daily. 30 capsule 2 09/11/2020   omeprazole (PRILOSEC) 20 MG capsule Take 1 capsule (20 mg total) by mouth daily. 60 capsule 2 Past Week   ondansetron (ZOFRAN ODT) 4 MG disintegrating tablet Take 1 tablet (4 mg total) by mouth every 8 (eight) hours as needed for nausea or vomiting. 30 tablet 1 Past Week   Prenatal Vit-Fe Fumarate-FA (PRENATAL MULTIVITAMIN) TABS tablet Take 1 tablet by mouth daily at 12 noon.   09/11/2020   promethazine (PHENERGAN) 25 MG tablet Take 1 tablet (25 mg total) by mouth every 6 (six) hours as needed for nausea or vomiting. 30 tablet 1 Past Week     Review of Systems   All  systems reviewed and negative except as stated in HPI  Blood pressure 118/80, pulse 82, temperature 97.6 F (36.4 C), temperature source Axillary, resp. rate 18, height 5\' 2"  (1.575 m), weight 77.1 kg, last menstrual period 01/11/2020, SpO2 99 %, unknown if currently breastfeeding. General appearance: alert, cooperative, fatigued, and no distress Lungs: Normal WOB  Heart: regular rate and rhythm Abdomen: soft, non-tender; bowel sounds normal Extremities: Homans sign is negative, no sign of DVT Presentation: cephalic Fetal monitoringBaseline: 120 bpm, Variability: Good {> 6 bpm), Accelerations: Reactive, and Decelerations: Absent Uterine activityNone Dilation: 1 Effacement (%): 30 Station: Ballotable Exam by:: Williams CNM   Prenatal labs: ABO, Rh: --/--/B POS (08/17 03-18-1975) Antibody: NEG (08/17 03-18-1975) Rubella: 4.38 (03/17 1502) RPR: Non Reactive (07/07 0833)  HBsAg: Negative (03/17 1502)  HIV: Non Reactive (07/07 0833)  GBS:   Unknown and pre-term  1 hr Glucola normal  Genetic screening  low risk, alpha thal [facility] [facility] [birth year]1982 see above   Prenatal Transfer Tool  Maternal Diabetes: No Genetic Screening: Normal Maternal Ultrasounds/Referrals: Normal Fetal Ultrasounds or other Referrals:  Fetal echo Maternal Substance Abuse:  No Significant Maternal Medications:  None Significant Maternal Lab Results: None  Results for orders placed or performed during the hospital encounter of 09/11/20 (from the past 24 hour(s))  Urinalysis, Routine w reflex microscopic Urine, Clean Catch   Collection Time: 09/11/20 11:53 PM  Result Value Ref Range   Color, Urine STRAW (A) YELLOW   APPearance HAZY (A) CLEAR   Specific Gravity, Urine 1.008 1.005 - 1.030   pH 7.0 5.0 - 8.0   Glucose, UA NEGATIVE NEGATIVE mg/dL   Hgb urine dipstick NEGATIVE NEGATIVE   Bilirubin Urine NEGATIVE NEGATIVE   Ketones, ur NEGATIVE NEGATIVE mg/dL   Protein, ur NEGATIVE NEGATIVE mg/dL   Nitrite NEGATIVE NEGATIVE    Leukocytes,Ua TRACE (A) NEGATIVE   RBC / HPF 0-5 0 - 5 RBC/hpf   WBC, UA 0-5 0 - 5 WBC/hpf   Bacteria, UA RARE (A) NONE SEEN   Squamous Epithelial / LPF 6-10 0 - 5  Protein / creatinine ratio, urine   Collection Time: 09/11/20 11:57 PM  Result Value Ref Range   Creatinine, Urine 59.62 mg/dL   Total Protein, Urine 9 mg/dL   Protein Creatinine Ratio 0.15 0.00 - 0.15 mg/mg[Cre]  CBC   Collection Time: 09/12/20 12:30 AM  Result Value Ref Range   WBC 6.2 4.0 - 10.5 K/uL   RBC 3.18 (L) 3.87 - 5.11 MIL/uL   Hemoglobin 8.4 (L) 12.0 - 15.0 g/dL   HCT 09/14/20 (L) 38.1 -  46.0 %   MCV 81.1 80.0 - 100.0 fL   MCH 26.4 26.0 - 34.0 pg   MCHC 32.6 30.0 - 36.0 g/dL   RDW 32.2 02.5 - 42.7 %   Platelets 261 150 - 400 K/uL   nRBC 0.0 0.0 - 0.2 %  Comprehensive metabolic panel   Collection Time: 09/12/20 12:30 AM  Result Value Ref Range   Sodium 134 (L) 135 - 145 mmol/L   Potassium 3.9 3.5 - 5.1 mmol/L   Chloride 108 98 - 111 mmol/L   CO2 19 (L) 22 - 32 mmol/L   Glucose, Bld 104 (H) 70 - 99 mg/dL   BUN 7 6 - 20 mg/dL   Creatinine, Ser 0.62 0.44 - 1.00 mg/dL   Calcium 8.5 (L) 8.9 - 10.3 mg/dL   Total Protein 5.7 (L) 6.5 - 8.1 g/dL   Albumin 2.5 (L) 3.5 - 5.0 g/dL   AST 14 (L) 15 - 41 U/L   ALT 7 0 - 44 U/L   Alkaline Phosphatase 64 38 - 126 U/L   Total Bilirubin 0.3 0.3 - 1.2 mg/dL   GFR, Estimated >37 >62 mL/min   Anion gap 7 5 - 15  Type and screen [facility]   Collection Time: 09/12/20  3:36 AM  Result Value Ref Range   ABO/RH(D) B POS    Antibody Screen NEG    Sample Expiration      09/15/2020,2359 Performed at [facility], [address] [place], [place] [address]     Patient Active Problem List   Diagnosis Date Noted   Preeclampsia 09/12/2020   Anemia in pregnancy 08/05/2020   Alpha thalassemia silent carrier 08/02/2020   Supervision of low-risk pregnancy 04/12/2020   Chronic headaches 04/12/2020    Assessment/Plan:  Devonne N Stiehl is a 30 y.o.  G2P1001 at [redacted]w[redacted]d here for IOL due to preeclampsia with severe features.   #Labor: After verbal consent, placed FB, patient tolerated well. In addition, given one buccal cytotec x1.  #Pain: PRN  #FWB: Cat 1 strip  #ID: GBS unknown, PCN prophylaxis for pre-term gestation.  #MOF: breastfeeding  #MOC: Previously wanted BTL, however would like postpone. Undecided on contraception for now.  #Circ: N/A   #Pre-e with severe features: Qualified with HA, blurred vision, and RUQ abdominal pain which have improved since arrival on L&D. No severe range Bps. Pre-e labs WNL. Will continue Mg and monitor labs q12/as needed.   #Pre-term induction: S/p BMZ x1. Will be able to give second BMZ if still pregnant on 8/18 3:00am. Consulted with neonatology team, will have present at delivery.   #Anemia: Hgb 8.4 on admit, should have post-partum CBC.     Allayne Stack, DO  09/12/2020, 1:04 PM

## 2020-09-12 NOTE — Consult Note (Signed)
Asked by Dr.Duncan to provide prenatal consultation for 30 y.o. G2 P1 who is now [redacted] weeks EGA and is being induced because of gestational HTNpre-eclampsia.  She was given a first dose of betamethasone at 0313 today but IOL will not be deferred for the 2nd dose.  Discussed with patient and FOB the usual expectations for preterm infant at [redacted] weeks gestation, including possible needs for DR resuscitation, respiratory support, IV access. Also presented usual criteria for discharge and possible length of stay in NICU until 37 - [redacted] wks EGA.  Discussed advantages of feeding with mother's milk and possible use of donor milk as "bridge" if needed until her supply is sufficient.  Patient and FOB were attentive, had appropriate questions, and expressed appreciation for my input.  Thank you for consulting Neonatology.  Total time 25 minutes, face-to-face time 10 minutes.  JWimmer, MD

## 2020-09-13 ENCOUNTER — Encounter (HOSPITAL_COMMUNITY): Payer: Self-pay | Admitting: Obstetrics and Gynecology

## 2020-09-13 ENCOUNTER — Ambulatory Visit: Payer: Medicaid Other | Admitting: Physical Therapy

## 2020-09-13 LAB — COMPREHENSIVE METABOLIC PANEL
ALT: 9 U/L (ref 0–44)
AST: 20 U/L (ref 15–41)
Albumin: 2.4 g/dL — ABNORMAL LOW (ref 3.5–5.0)
Alkaline Phosphatase: 56 U/L (ref 38–126)
Anion gap: 10 (ref 5–15)
BUN: <5 mg/dL — ABNORMAL LOW (ref 6–20)
CO2: 20 mmol/L — ABNORMAL LOW (ref 22–32)
Calcium: 7.4 mg/dL — ABNORMAL LOW (ref 8.9–10.3)
Chloride: 101 mmol/L (ref 98–111)
Creatinine, Ser: 0.64 mg/dL (ref 0.44–1.00)
GFR, Estimated: >60 mL/min (ref >60)
Glucose, Bld: 143 mg/dL — ABNORMAL HIGH (ref 70–99)
Potassium: 3.7 mmol/L (ref 3.5–5.1)
Sodium: 131 mmol/L — ABNORMAL LOW (ref 135–145)
Total Bilirubin: 0.1 mg/dL — ABNORMAL LOW (ref 0.3–1.2)
Total Protein: 5.8 g/dL — ABNORMAL LOW (ref 6.5–8.1)

## 2020-09-13 LAB — CBC
HCT: 27 % — ABNORMAL LOW (ref 36.0–46.0)
Hemoglobin: 8.8 g/dL — ABNORMAL LOW (ref 12.0–15.0)
MCH: 26.7 pg (ref 26.0–34.0)
MCHC: 32.6 g/dL (ref 30.0–36.0)
MCV: 82.1 fL (ref 80.0–100.0)
Platelets: 299 10*3/uL (ref 150–400)
RBC: 3.29 MIL/uL — ABNORMAL LOW (ref 3.87–5.11)
RDW: 13.3 % (ref 11.5–15.5)
WBC: 17.6 10*3/uL — ABNORMAL HIGH (ref 4.0–10.5)
nRBC: 0 % (ref 0.0–0.2)

## 2020-09-13 LAB — RPR: RPR Ser Ql: NONREACTIVE

## 2020-09-13 MED ORDER — ONDANSETRON HCL 4 MG PO TABS: 4.0000 mg | ORAL_TABLET | ORAL | Status: DC | PRN

## 2020-09-13 MED ORDER — NIFEDIPINE ER OSMOTIC RELEASE 30 MG PO TB24
30.0000 mg | ORAL_TABLET | Freq: Two times a day (BID) | ORAL | Status: DC
Start: 2020-09-13 — End: 2020-09-15
  Administered 2020-09-13 – 2020-09-14 (×3): 30 mg via ORAL
  Filled 2020-09-13 (×3): qty 1

## 2020-09-13 MED ORDER — MAGNESIUM SULFATE 40 GM/1000ML IV SOLN
2.0000 g/h | INTRAVENOUS | Status: AC
  Administered 2020-09-13: 2 g/h via INTRAVENOUS
  Filled 2020-09-13: qty 1000

## 2020-09-13 MED ORDER — ONDANSETRON HCL 4 MG/2ML IJ SOLN: 4.0000 mg | INTRAMUSCULAR | Status: DC | PRN

## 2020-09-13 MED ORDER — SIMETHICONE 80 MG PO CHEW: 80.0000 mg | CHEWABLE_TABLET | ORAL | Status: DC | PRN

## 2020-09-13 MED ORDER — LACTATED RINGERS IV SOLN: INTRAVENOUS | Status: DC

## 2020-09-13 MED ORDER — PRENATAL MULTIVITAMIN CH
1.0000 | ORAL_TABLET | Freq: Every day | ORAL | Status: DC
  Administered 2020-09-13 – 2020-09-15 (×3): 1 via ORAL
  Filled 2020-09-13 (×3): qty 1

## 2020-09-13 MED ORDER — COCONUT OIL OIL
1.0000 "application " | TOPICAL_OIL | Status: DC | PRN
  Administered 2020-09-13: 1 via TOPICAL

## 2020-09-13 MED ORDER — OXYCODONE HCL 5 MG PO TABS
5.0000 mg | ORAL_TABLET | ORAL | Status: DC | PRN
Start: 2020-09-13 — End: 2020-09-16
  Administered 2020-09-13 – 2020-09-16 (×4): 5 mg via ORAL
  Filled 2020-09-13 (×5): qty 1

## 2020-09-13 MED ORDER — WITCH HAZEL-GLYCERIN EX PADS: 1.0000 "application " | MEDICATED_PAD | CUTANEOUS | Status: DC | PRN

## 2020-09-13 MED ORDER — MEASLES, MUMPS & RUBELLA VAC IJ SOLR: 0.5000 mL | Freq: Once | INTRAMUSCULAR | Status: DC

## 2020-09-13 MED ORDER — LACTATED RINGERS IV SOLN: INTRAVENOUS | Status: AC

## 2020-09-13 MED ORDER — DIBUCAINE (PERIANAL) 1 % EX OINT: 1.0000 "application " | TOPICAL_OINTMENT | CUTANEOUS | Status: DC | PRN

## 2020-09-13 MED ORDER — DIPHENHYDRAMINE HCL 25 MG PO CAPS: 25.0000 mg | ORAL_CAPSULE | Freq: Four times a day (QID) | ORAL | Status: DC | PRN

## 2020-09-13 MED ORDER — IBUPROFEN 600 MG PO TABS
600.0000 mg | ORAL_TABLET | Freq: Four times a day (QID) | ORAL | Status: DC
  Administered 2020-09-13 – 2020-09-16 (×12): 600 mg via ORAL
  Filled 2020-09-13 (×14): qty 1

## 2020-09-13 MED ORDER — BENZOCAINE-MENTHOL 20-0.5 % EX AERO
1.0000 "application " | INHALATION_SPRAY | CUTANEOUS | Status: DC | PRN
  Administered 2020-09-13: 1 via TOPICAL
  Filled 2020-09-13: qty 56

## 2020-09-13 MED ORDER — MEDROXYPROGESTERONE ACETATE 150 MG/ML IM SUSP
150.0000 mg | INTRAMUSCULAR | Status: DC | PRN
  Filled 2020-09-13: qty 1

## 2020-09-13 MED ORDER — SENNOSIDES-DOCUSATE SODIUM 8.6-50 MG PO TABS
2.0000 | ORAL_TABLET | Freq: Every day | ORAL | Status: DC
  Administered 2020-09-13 – 2020-09-15 (×3): 2 via ORAL
  Filled 2020-09-13 (×5): qty 2

## 2020-09-13 MED ORDER — SODIUM CHLORIDE 0.9 % IV SOLN
500.0000 mg | Freq: Once | INTRAVENOUS | Status: AC
  Administered 2020-09-13: 500 mg via INTRAVENOUS
  Filled 2020-09-13: qty 25

## 2020-09-13 MED ORDER — ACETAMINOPHEN 325 MG PO TABS
650.0000 mg | ORAL_TABLET | ORAL | Status: DC | PRN
  Filled 2020-09-13: qty 2

## 2020-09-13 MED ORDER — TETANUS-DIPHTH-ACELL PERTUSSIS 5-2.5-18.5 LF-MCG/0.5 IM SUSY: 0.5000 mL | PREFILLED_SYRINGE | Freq: Once | INTRAMUSCULAR | Status: DC

## 2020-09-13 NOTE — Lactation Note (Signed)
This note was copied from a baby's chart. Lactation Consultation Note  Patient Name: Heather Malone [id number] Date: 09/13/2020 Reason for consult: Initial assessment;NICU baby;Late-preterm 34-36.6wks Age:30 hours  1530 - I assisted Ms. Bonfield with initiation of her first pumping session. I reviewed the NICU Injoy guide and assembly, cleaning, disassembly of pump parts. I recommended HE after for 1-2 minutes/breast. We discussed oral care in the NICU, and what to expect with pumping in the first 1-3 days after delivery.   Maternal Data Has patient been taught Hand Expression?: Yes Does the patient have breastfeeding experience prior to this delivery?: Yes How long did the patient breastfeed?: 4-5 months  Feeding Mother's Current Feeding Choice: Breast Milk and Donor Milk   Lactation Tools Discussed/Used Tools: Flanges;Pump Flange Size: 24 Breast pump type: Double-Electric Breast Pump Pump Education: Setup, frequency, and cleaning Reason for Pumping: NICU Pumping frequency: recommended 8 times/day Pumped volume:  (droplets)  Interventions Interventions: Breast feeding basics reviewed;DEBP;Education  Discharge Pump: DEBP  Consult Status Consult Status: Follow-up Follow-up type: In-patient    Walker Shadow 09/13/2020, 4:13 PM

## 2020-09-13 NOTE — Plan of Care (Signed)
  Problem: Education: °Goal: Knowledge of disease or condition will improve °Outcome: Completed/Met °Goal: Knowledge of the prescribed therapeutic regimen will improve °Outcome: Completed/Met °Goal: Individualized Educational Video(s) °Outcome: Completed/Met °  °Problem: Clinical Measurements: °Goal: Complications related to the disease process, condition or treatment will be avoided or minimized °Outcome: Completed/Met °  °

## 2020-09-13 NOTE — Progress Notes (Signed)
Post Partum Day 1 s/p SVD Subjective: voiding, tolerating PO, and notes moderate pain around her hips. Headache is much improved from yesterday.   Objective: Blood pressure 109/72, pulse 74, temperature 98.4 F (36.9 C), temperature source Oral, resp. rate 18, height 5\' 2"  (1.575 m), weight 77.1 kg, last menstrual period 01/11/2020, SpO2 94 %, unknown if currently breastfeeding.  Physical Exam:  General: alert and no distress Lochia: appropriate Uterine Fundus: firm Incision: no incision DVT Evaluation: No evidence of DVT seen on physical exam. No cords or calf tenderness. No significant calf/ankle edema.  Recent Labs    09/12/20 1326 09/13/20 0211  HGB 9.1* 8.8*  HCT 27.7* 27.0*    Assessment/Plan: Plan for discharge tomorrow, Discharge home, Breastfeeding, Lactation consult, and Contraception   Severe PreE  - Continue Magnesium for 24 hours - No indication for BP medication at this time. Symptoms improved.   Routine Postpartum - Breastfeeding - Undecided about birth control  - Baby girl doing well overall in the NICU given GA (34w) - Oxycodone added for breakthrough pain  Anemia of Pregnancy - s/p IV venofer - Hgb appropriate this morning   LOS: 1 day   09/15/20 09/13/2020, 11:39 AM

## 2020-09-14 ENCOUNTER — Inpatient Hospital Stay (HOSPITAL_COMMUNITY): Payer: Medicaid Other

## 2020-09-14 ENCOUNTER — Other Ambulatory Visit (HOSPITAL_COMMUNITY): Payer: Self-pay

## 2020-09-14 LAB — TROPONIN I (HIGH SENSITIVITY)
Troponin I (High Sensitivity): 18 ng/L — ABNORMAL HIGH (ref <18)
Troponin I (High Sensitivity): 24 ng/L — ABNORMAL HIGH (ref <18)

## 2020-09-14 MED ORDER — IBUPROFEN 600 MG PO TABS
600.0000 mg | ORAL_TABLET | Freq: Four times a day (QID) | ORAL | 0 refills | Status: DC
  Filled 2020-09-14: qty 30, 8d supply, fill #0

## 2020-09-14 MED ORDER — FUROSEMIDE 10 MG/ML IJ SOLN
20.0000 mg | Freq: Two times a day (BID) | INTRAMUSCULAR | Status: AC
  Administered 2020-09-14 (×2): 20 mg via INTRAVENOUS
  Filled 2020-09-14 (×2): qty 2

## 2020-09-14 MED ORDER — FUROSEMIDE 20 MG PO TABS
20.0000 mg | ORAL_TABLET | Freq: Two times a day (BID) | ORAL | 0 refills | Status: DC
  Filled 2020-09-14: qty 10, 5d supply, fill #0

## 2020-09-14 MED ORDER — NIFEDIPINE ER 30 MG PO TB24
30.0000 mg | ORAL_TABLET | Freq: Two times a day (BID) | ORAL | 1 refills | Status: DC
  Filled 2020-09-14: qty 60, 30d supply, fill #0

## 2020-09-14 NOTE — Progress Notes (Signed)
Post Partum Day 1 Subjective: no complaints, up ad lib, voiding, and tolerating PO  Objective: Blood pressure (!) 132/98, pulse 86, temperature 98.4 F (36.9 C), temperature source Oral, resp. rate 17, height 5\' 2"  (1.575 m), weight 77.1 kg, last menstrual period 01/11/2020, SpO2 99 %, unknown if currently breastfeeding. Vitals with BMI 09/14/2020 09/14/2020 09/13/2020  Height - - -  Weight - - -  BMI - - -  Systolic 132 131 09/15/2020  Diastolic 98 84 95  Pulse 86 72 74    Intake/Output Summary (Last 24 hours) at 09/14/2020 0848 Last data filed at 09/14/2020 09/16/2020 Gross per 24 hour  Intake 3656.4 ml  Output 2900 ml  Net 756.4 ml    Physical Exam:  General: alert, cooperative, and appears stated age 30: appropriate Uterine Fundus: firm DVT Evaluation: No evidence of DVT seen on physical exam.  Recent Labs    09/12/20 1326 09/13/20 0211  HGB 9.1* 8.8*  HCT 27.7* 27.0*    Assessment/Plan: Plan for discharge tomorrow, Breastfeeding, and Contraception undecided Continue Procardia XL 30 mg BID for BP control. Addition of lasix for fluid mobilization   LOS: 2 days   09/15/20 09/14/2020, 8:47 AM

## 2020-09-14 NOTE — Progress Notes (Signed)
Pt called with SOB and HA initially. The SOB on further discussion more chest discomfort in the substernal area. It does not radiate. She has no other associated symptoms.   Vitals signs stable and normal. Normotensive on Procardia.  On physical exam, she is resting comfortable in the bed, no acute distress. She is speaking in full sentences and breathing normally. She is not diaphoretic.  CV is RRR.    EKG NSR CXR with no active disease.  Hgb postdelivery is 8.4.  Troponin pending.   Plan:  - Anticipate normal troponin. Given normal appearance, normal EKG and CXR, anticipate this is due to her anemia. She is s/p venofer. No indication for blood transfusion. Pt feels comfortable with [place] ruling out the most important issues I.e. pulm edema, mi and if these tests are normal we will monitor - In terms of HA she is s/p mag, she is normotensive. Most likely side effect of procardia, but will monitor since she is staying overnight.  - We did while we waited for the EKG discuss birth control and she declines birth control.

## 2020-09-14 NOTE — Lactation Note (Signed)
This note was copied from a baby's chart. Lactation Consultation Note  Patient Name: Heather Malone [id number] Date: 09/14/2020 Reason for consult: Follow-up assessment;NICU baby;Late-preterm 34-36.6wks;Infant < 6lbs Age:30 hours  Visited with mom of 39 hours old LPI NICU female, she's been pumping consistently and already getting some drops of colostrum; praised her for her efforts.  Mom voiced she doesn't have a pump at home, she said she ordered one through her insurance but it won't be here at least within 30 days of her due date. She received WIC during the pregnancy, LC offered to fax a [facility] referral form, mom agreed.   Reviewed pumping schedule, lactogenesis II and pumping supplies.  Plan of care:  Encouraged mom to continue pumping every 2-3 hours, at least 8 pumping sessions/24 hours Breast massage, hand expression and coconut oil were also encouraged prior pumping  FOB present. All questions and concerns answered, parents to call NICU LC PRN.  Maternal Data   Mom's supply is WNL  Feeding Mother's Current Feeding Choice: Breast Milk and Donor Milk  Lactation Tools Discussed/Used Tools: Pump;Flanges Flange Size: 24 Breast pump type: Double-Electric Breast Pump Pump Education: Setup, frequency, and cleaning;Milk Storage Reason for Pumping: LPI in NICU < 5 lbs Pumped volume:  (more drops)  Interventions Interventions: Breast feeding basics reviewed;DEBP;Education  Discharge Pump: DEBP WIC Program: Yes  Consult Status Consult Status: Follow-up Follow-up type: In-patient    Tnia Anglada Venetia Constable 09/14/2020, 1:44 PM

## 2020-09-14 NOTE — Progress Notes (Signed)
At bedside to check on pt, who is resting comfortably and taking a nap.  Reports she only has a mild headache- rates her pain 4/10.  Denies SOB or chest pain- states this has resolved.  O:BP 128/85 (BP Location: Left Arm)   Pulse 75   Temp 98.5 F (36.9 C) (Oral)   Resp 18   Ht 5\' 2"  (1.575 m)   Wt 77.1 kg   LMP 01/11/2020   SpO2 98%   Breastfeeding Unknown   BMI 31.09 kg/m   Results for orders placed or performed during the hospital encounter of 09/11/20 (from the past 24 hour(s))  Troponin I (High Sensitivity)     Status: Abnormal   Collection Time: 09/14/20  3:32 PM  Result Value Ref Range   Troponin I (High Sensitivity) 18 (H) <18 ng/L  Troponin I (High Sensitivity)     Status: Abnormal   Collection Time: 09/14/20  5:14 PM  Result Value Ref Range   Troponin I (High Sensitivity) 24 (H) <18 ng/L   Reviewed with Dr. 09/16/20.  EKG and CXR normal.  Mild elevation in troponin noted; however, may be due to hypertensive state.  No other concerning finds at this time.  Will continue to closely monitor.  Harvie Heck, DO Attending Obstetrician & Gynecologist, [facility] for [facility]

## 2020-09-14 NOTE — Progress Notes (Signed)
Pt complained of SOB and chest tightness. VS and O2 sat WNL. Lung sounds are clear.

## 2020-09-14 NOTE — Social Work (Signed)
CSW received consult due to score of 11 on the Edinburgh Postpartum Depression Scale.    CSW met with MOB in room 117. CSW introduced self and role. CSW observed FOB sleeping on couch. Infant Nariah is admitted to NICU. CSW offered to return, however MOB declined and stated FOB could remain in room for assessment. CSW informed MOB of reason for consult and assessed current feelings. MOB was welcoming and receptive to visit. MOB shared she is doing okay, however she has experienced some anxiety due to being separated from infant. CSW validated MOB feelings and asked if she has been able to adequately visit. MOB stated she has been visiting with infant and feels informed on infant's care. CSW made MOB aware that she has 24/7 access to visit with infant during the NICU admission. MOB was understanding. MOB denies any barriers to visiting with infant.  CSW asked MOB if she has a diagnosis of anxiety. MOB reported was diagnosed with panic attacks in 2006. MOB denies experiencing any attack during pregnancy and stated the anxiety is currently manageable. MOB stated she has never taken medication or been to therapy, although she is open to it if necessary. MOB denies any additional mental health diagnosis. MOB identified FOB and her parents as primary supports. MOB denies any current SI or HI. CSW did not inquire on DV due to FOB being present.   CSW provided baby blues versus PPD education. MOB encouraged to self evaluate using the New Mom Checklist. MOB reported she is comfortable seeking professional support if symptoms arise.   CSW provided review of Sudden Infant Death Syndrome (SIDS) precautions. MOB reported she has a crib and will obtain a car seat prior to infant discharge. MOB aware to notify staff if car seat not obtained. MOB identified [facility] for follow-up care and denies barriers to care. MOB reported no additional needs at this time.   CSW available to provide continued support and  resources throughout NICU admission.   Heather Malone, LCSWA Clinical Social Work [facility] [phone] 

## 2020-09-15 MED ORDER — NIFEDIPINE ER OSMOTIC RELEASE 90 MG PO TB24: 90.0000 mg | ORAL_TABLET | Freq: Every day | ORAL | 0 refills | Status: DC

## 2020-09-15 MED ORDER — LISINOPRIL 10 MG PO TABS
20.0000 mg | ORAL_TABLET | Freq: Every day | ORAL | Status: DC
  Administered 2020-09-15 – 2020-09-16 (×2): 20 mg via ORAL
  Filled 2020-09-15 (×2): qty 2

## 2020-09-15 MED ORDER — SERTRALINE HCL 50 MG PO TABS
50.0000 mg | ORAL_TABLET | Freq: Every day | ORAL | Status: DC
  Administered 2020-09-15 – 2020-09-16 (×2): 50 mg via ORAL
  Filled 2020-09-15 (×2): qty 1

## 2020-09-15 MED ORDER — ACETAMINOPHEN 325 MG PO TABS: 650.0000 mg | ORAL_TABLET | Freq: Four times a day (QID) | ORAL | Status: DC | PRN

## 2020-09-15 MED ORDER — NIFEDIPINE ER 30 MG PO TB24: 90.0000 mg | ORAL_TABLET | Freq: Every day | ORAL | 1 refills | Status: DC

## 2020-09-15 MED ORDER — SERTRALINE HCL 50 MG PO TABS: 50.0000 mg | ORAL_TABLET | Freq: Every day | ORAL | 4 refills | Status: DC

## 2020-09-15 MED ORDER — NIFEDIPINE ER OSMOTIC RELEASE 60 MG PO TB24
90.0000 mg | ORAL_TABLET | Freq: Every day | ORAL | Status: DC
  Administered 2020-09-15 – 2020-09-16 (×2): 90 mg via ORAL
  Filled 2020-09-15 (×2): qty 1

## 2020-09-15 NOTE — Progress Notes (Signed)
This RN entered room.  Patient very tearful about her increased BP and the increase of her Procardia dose to 90mg .  Patient requesting to speak with provider.  Dr. notified and will speak to patient.

## 2020-09-15 NOTE — Lactation Note (Signed)
This note was copied from a baby's chart. Lactation Consultation Note  Patient Name: Heather Malone LYHTM'B Date: 09/15/2020 Reason for consult: Follow-up assessment;NICU baby;Late-preterm 34-36.6wks;Infant < 6lbs Age:31 hours  Visited with mom of 3 hours old LPI NICU female; she was going to get discharge today but her discharge got cancelled. Mom holding swaddled baby in the NICU upon entry.  She continues to pump consistently and her supply has started to increase, praised her for her efforts. Reviewed pumping schedule, milk storage guidelines (labels), and benefits of STS care.  Plan of care:   Encouraged mom to continue pumping every 2-3 hours, at least 8 pumping sessions/24 hours STS was also strongly encouraged   No other support person at this time. All questions and concerns answered, mom to call NICU LC PRN.   Maternal Data   Mom's supply is WNL  Feeding Mother's Current Feeding Choice: Breast Milk and Donor Milk  Lactation Tools Discussed/Used Tools: Pump;Flanges Flange Size: 24 Breast pump type: Double-Electric Breast Pump Pump Education: Setup, frequency, and cleaning;Milk Storage Reason for Pumping: LPI in NICU < 5 lbs Pumping frequency: 8 times/24 hours Pumped volume: 10 mL  Interventions Interventions: Breast feeding basics reviewed;DEBP;Education  Discharge Discharge Education: Engorgement and breast care Pump: DEBP  Consult Status Consult Status: Follow-up Follow-up type: In-patient    Blinda Turek Venetia Constable 09/15/2020, 4:36 PM

## 2020-09-16 MED ORDER — MORPHINE SULFATE (PF) 4 MG/ML IV SOLN
5.0000 mg | Freq: Once | INTRAVENOUS | Status: AC
  Administered 2020-09-16: 5 mg via INTRAMUSCULAR
  Filled 2020-09-16: qty 2

## 2020-09-16 MED ORDER — OXYCODONE HCL 5 MG PO TABS: 5.0000 mg | ORAL_TABLET | ORAL | 0 refills | Status: DC | PRN

## 2020-09-16 MED ORDER — LISINOPRIL 20 MG PO TABS: 20.0000 mg | ORAL_TABLET | Freq: Every day | ORAL | 1 refills | Status: DC

## 2020-09-16 NOTE — Lactation Note (Signed)
This note was copied from a baby's chart. Lactation Consultation Note Mother continues to pump frequently and with appropriate volume of colostrum today. No s/s engorgement.   Patient Name: Heather Malone ESLPN'P Date: 09/16/2020 Reason for consult: Follow-up assessment;NICU baby Age:30 days  Maternal Data  Pumping frequency: q2-3 Pumped volume: 10 mL +breast changes today  Feeding Mother's Current Feeding Choice: Breast Milk  Interventions Interventions: Education  Discharge Discharge Education: Engorgement and breast care IDF algorithm   Consult Status Consult Status: Follow-up Follow-up type: In-patient   Elder Negus, MA IBCLC 09/16/2020, 11:28 AM

## 2020-09-17 ENCOUNTER — Ambulatory Visit: Payer: Self-pay

## 2020-09-17 LAB — SURGICAL PATHOLOGY

## 2020-09-17 NOTE — Telephone Encounter (Signed)
Patient phone call

## 2020-09-17 NOTE — Lactation Note (Signed)
This note was copied from a baby's chart. Lactation Consultation Note Mother continues to pump frequently and with normal milk volume. She has no s/s of engorgement today. Mother is in contact with WIC but has not yet picked up pump. She pumped with a hand pump at home last night.   POC: Continue to pump q3 Pick up pump from [facility]  Patient Name: Heather Malone [id number] Date: 09/17/2020 Reason for consult: Follow-up assessment Age:30 days  Maternal Data  Pumping frequency: q3 Pumped volume: 15 mL  Feeding Mother's Current Feeding Choice: Breast Milk and Donor Milk   Interventions Interventions: Education  Consult Status Consult Status: Follow-up Follow-up type: In-patient  Elder Negus 09/17/2020, 1:36 PM

## 2020-09-18 ENCOUNTER — Telehealth: Payer: Self-pay

## 2020-09-18 ENCOUNTER — Encounter (HOSPITAL_COMMUNITY): Payer: Self-pay | Admitting: Obstetrics & Gynecology

## 2020-09-18 ENCOUNTER — Inpatient Hospital Stay (HOSPITAL_COMMUNITY)
Admission: AD | Admit: 2020-09-18 | Discharge: 2020-09-18 | Disposition: A | Discharge status: Home / Self Care | Payer: Medicaid Other | Attending: Obstetrics & Gynecology | Admitting: Obstetrics & Gynecology

## 2020-09-18 DIAGNOSIS — R109 Unspecified abdominal pain: Secondary | ICD-10-CM | POA: Diagnosis not present

## 2020-09-18 DIAGNOSIS — O9089 Other complications of the puerperium, not elsewhere classified: Secondary | ICD-10-CM | POA: Insufficient documentation

## 2020-09-18 DIAGNOSIS — Z79899 Other long term (current) drug therapy: Secondary | ICD-10-CM | POA: Diagnosis not present

## 2020-09-18 DIAGNOSIS — R519 Headache, unspecified: Secondary | ICD-10-CM | POA: Diagnosis not present

## 2020-09-18 DIAGNOSIS — O99893 Other specified diseases and conditions complicating puerperium: Secondary | ICD-10-CM | POA: Diagnosis not present

## 2020-09-18 DIAGNOSIS — R079 Chest pain, unspecified: Secondary | ICD-10-CM | POA: Diagnosis not present

## 2020-09-18 DIAGNOSIS — O1493 Unspecified pre-eclampsia, third trimester: Secondary | ICD-10-CM

## 2020-09-18 LAB — COMPREHENSIVE METABOLIC PANEL
ALT: 32 U/L (ref 0–44)
AST: 22 U/L (ref 15–41)
Albumin: 3.3 g/dL — ABNORMAL LOW (ref 3.5–5.0)
Alkaline Phosphatase: 55 U/L (ref 38–126)
Anion gap: 8 (ref 5–15)
BUN: 12 mg/dL (ref 6–20)
CO2: 22 mmol/L (ref 22–32)
Calcium: 8.9 mg/dL (ref 8.9–10.3)
Chloride: 104 mmol/L (ref 98–111)
Creatinine, Ser: 0.56 mg/dL (ref 0.44–1.00)
GFR, Estimated: >60 mL/min (ref >60)
Glucose, Bld: 81 mg/dL (ref 70–99)
Potassium: 3.9 mmol/L (ref 3.5–5.1)
Sodium: 134 mmol/L — ABNORMAL LOW (ref 135–145)
Total Bilirubin: 0.3 mg/dL (ref 0.3–1.2)
Total Protein: 7.1 g/dL (ref 6.5–8.1)

## 2020-09-18 LAB — CBC
HCT: 30.7 % — ABNORMAL LOW (ref 36.0–46.0)
Hemoglobin: 9.5 g/dL — ABNORMAL LOW (ref 12.0–15.0)
MCH: 26.4 pg (ref 26.0–34.0)
MCHC: 30.9 g/dL (ref 30.0–36.0)
MCV: 85.3 fL (ref 80.0–100.0)
Platelets: 360 10*3/uL (ref 150–400)
RBC: 3.6 MIL/uL — ABNORMAL LOW (ref 3.87–5.11)
RDW: 15.4 % (ref 11.5–15.5)
WBC: 7.5 10*3/uL (ref 4.0–10.5)
nRBC: 0.3 % — ABNORMAL HIGH (ref 0.0–0.2)

## 2020-09-18 LAB — TROPONIN I (HIGH SENSITIVITY): Troponin I (High Sensitivity): 16 ng/L (ref <18)

## 2020-09-18 MED ORDER — METOCLOPRAMIDE HCL 5 MG/ML IJ SOLN
10.0000 mg | Freq: Once | INTRAMUSCULAR | Status: AC
  Administered 2020-09-18: 10 mg via INTRAVENOUS
  Filled 2020-09-18: qty 2

## 2020-09-18 MED ORDER — PROCHLORPERAZINE EDISYLATE 10 MG/2ML IJ SOLN
10.0000 mg | Freq: Four times a day (QID) | INTRAMUSCULAR | Status: DC | PRN
  Administered 2020-09-18: 10 mg via INTRAVENOUS
  Filled 2020-09-18 (×2): qty 2

## 2020-09-18 MED ORDER — LACTATED RINGERS IV SOLN: Freq: Once | INTRAVENOUS | Status: AC

## 2020-09-18 MED ORDER — DIPHENHYDRAMINE HCL 50 MG/ML IJ SOLN
25.0000 mg | Freq: Once | INTRAMUSCULAR | Status: AC
  Administered 2020-09-18: 25 mg via INTRAVENOUS
  Filled 2020-09-18: qty 1
  Filled 2020-09-18: qty 0.5

## 2020-09-18 NOTE — Telephone Encounter (Addendum)
Called and spoke with patient. Patient reports she is planning to check it again. BP 132/76,  while on the phone. Earlier BP 135/102 at home.   She is having pain on the right side of chest starting when she woke up this morning. She is pumping and is not engorged.   She is feeling sluggish and not feeling well.   She is having a continuous headache, lightheadness, and dizziness. She has taken the Ibuprofen 600 mg every 6 hours and no other pain meds. If lightens the headache up but does not go away. She has not tried Tylenol.   Patient reports she has swelling to feet, ankles and legs and hands. The swelling has not changed recently. She has been on Lasix previously.   Patient is taking Lisinopril 20 mg once daily and Nifedipine 90 mg every day, she has taken both of these this morning.   Mom reports infant is doing well, infant is in the NICU. She is off the Oxygen and IV. She is still needing tube feeds.   Advised patient to go to MAU for evaluation with all the symptoms she is having to be evaluated. Patient voiced understanding.   Mom is waiting on husband to come home and then will head to MAU for evaluation.

## 2020-09-18 NOTE — MAU Note (Signed)
Vag del on 8/17, induced severe pre-e. Pt dc'd on BP medication. Had high BP this morning.is swelling again, feels light headed, has headache and pain under right breast this morning. Took Ibuprofen, eased HA a little. Feels really heavy, having a hard time walking.

## 2020-09-18 NOTE — Discharge Instructions (Signed)

## 2020-09-18 NOTE — Telephone Encounter (Signed)
Call received from [facility] with critical blood pressure 135/102. Documented headache, swelling, and abdominal pain.

## 2020-09-18 NOTE — Lactation Note (Signed)
This note was copied from a baby's chart. Lactation Consultation Note  Patient Name: Heather Malone Date: 09/18/2020 Reason for consult: Other (Comment);Follow-up assessment;NICU baby;Late-preterm 34-36.6wks (MAU Readmit) Age:30 days  Visited with mom of 74 89/32 days old NICU female, she got readmitted to MAU due to pre-eclampsia. Mom was very tired and sleepy when entered the room, spoke to MAU RN Joni Reining and she reported that was was given Benadryl and that she was very sleepy.  Mom fell asleep during [facility] consultation, but she did say that she missed her 3 pm appt this afternoon at the [facility] office to P/U her pump. Encouraged mom to re-schedule since she doesn't have a pump at home (other than her hand pump). She voiced she's been pumping consistently every 3 hours, but unsure of amounts.  Plan of care:   Encouraged mom to continue pumping every 2-3 hours, at least 8 pumping sessions/24 hours She'll re-schedule her [facility] appt to P/U her pump   No other support person at this time. All questions and concerns answered, mom to call NICU LC PRN.  Feeding Mother's Current Feeding Choice: Breast Milk and Donor Milk  Lactation Tools Discussed/Used Tools: Pump;Flanges Flange Size: 24 Breast pump type: Double-Electric Breast Pump Pump Education: Setup, frequency, and cleaning;Milk Storage Reason for Pumping: LPI in NICU Pumping frequency: q 3 hours Pumped volume:  (unsure, mom very sleepy right now and MAU RN reported that mom didn't even mentioned she was pumping)  Interventions Interventions: Breast feeding basics reviewed;Education;DEBP (needs to P/U her DEBP from [facility])  Discharge Pump: DEBP  Consult Status Consult Status: Follow-up Follow-up type: In-patient    Heather Malone 09/18/2020, 4:42 PM

## 2020-09-18 NOTE — MAU Provider Note (Signed)
History     CSN: [id number]  Arrival date and time: 09/18/20 1351   Event Date/Time   First Provider Initiated Contact with Patient 09/18/20 1424      Chief Complaint  Patient presents with   Abdominal Pain   Headache   light headed   HPI Heather Malone is a 30 y.o. [id number] postpartum patient who presents to MAU for evaluation of multiple complaints. She is s/p vaginal delivery on on 09/12/2020 following IOL for Preeclampsia with Severe Features.  Headache This is a new problem, onset during patient's hospital admission. Pain is anterior, bilateral and does not radiate. Pain score is 7/10. She has attempted management with the Motrin 600 mg prescribed at hospital discharge but has not experienced relief. She denies visual disturbances, RUQ/epigastric pain, new onset swelling or weight gain.  Chest pain This is a recurrent problem,onset during hospital admission. Patient endorses left upper chest and sternal chest pain. She states her anxiety is sometimes the cause. She denies palpitations, weakness, activity intolerance, syncope.  Abdominal Pain This is a new problem, uncertain onset. Patient endorses pain along the far right margin of her liver, far RUQ of her abdomen. Pain score is 6/10. She denies aggravating or alleviating factors.    OB History     Gravida  2   Para  2   Term  1   Preterm  1   AB  0   Living  2      SAB  0   IAB  0   Ectopic  0   Multiple  0   Live Births  2           Past Medical History:  Diagnosis Date   Anemia    Anxiety    Asthma    albuterol prn   Headache    migraines   Hx of trichomoniasis    Hx of varicella    Neck injury, subsequent encounter 07/26/2016   Placental abruption in third trimester 09/04/2014   Subchorionic hemorrhage in third trimester 07/26/2014   Velamentous insertion of umbilical cord     Past Surgical History:  Procedure Laterality Date   TONSILLECTOMY      Family History  Problem  Relation Age of Onset   Hypertension Mother    Hypertension Maternal Grandmother    Heart attack Maternal Grandmother    Hypertension Maternal Grandfather    Cancer Maternal Grandfather    Diabetes Paternal Grandmother     Social History   Tobacco Use   Smoking status: Former    Types: Cigars   Smokeless tobacco: Never  Vaping Use   Vaping Use: Never used  Substance Use Topics   Alcohol use: Not Currently   Drug use: Not Currently    Types: Marijuana    Comment: none with + UPT    Allergies: No Known Allergies  Medications Prior to Admission  Medication Sig Dispense Refill Last Dose   acetaminophen (TYLENOL) 325 MG tablet Take 2 tablets (650 mg total) by mouth every 6 (six) hours as needed for moderate pain (for pain scale < 4).      furosemide (LASIX) 20 MG tablet Take 1 tablet (20 mg total) by mouth 2 (two) times daily. 10 tablet 0    ibuprofen (ADVIL) 600 MG tablet Take 1 tablet (600 mg total) by mouth every 6 (six) hours. 30 tablet 0    iron polysaccharides (NIFEREX) 150 MG capsule Take 1 capsule (150 mg total) by mouth daily. 30  capsule 2    lisinopril (ZESTRIL) 20 MG tablet Take 1 tablet (20 mg total) by mouth daily. 30 tablet 1    NIFEdipine (PROCARDIA XL/NIFEDICAL-XL) 90 MG 24 hr tablet Take 1 tablet (90 mg total) by mouth daily. 30 tablet 0    omeprazole (PRILOSEC) 20 MG capsule Take 1 capsule (20 mg total) by mouth daily. (Patient taking differently: Take 20 mg by mouth daily as needed (indigestion).) 60 capsule 2    ondansetron (ZOFRAN ODT) 4 MG disintegrating tablet Take 1 tablet (4 mg total) by mouth every 8 (eight) hours as needed for nausea or vomiting. 30 tablet 1    oxyCODONE (OXY IR/ROXICODONE) 5 MG immediate release tablet Take 1 tablet (5 mg total) by mouth every 4 (four) hours as needed for severe pain. 30 tablet 0    Prenatal Vit-Fe Fumarate-FA (PRENATAL MULTIVITAMIN) TABS tablet Take 1 tablet by mouth daily at 12 noon.      sertraline (ZOLOFT) 50 MG tablet  Take 1 tablet (50 mg total) by mouth daily. 90 tablet 4     Review of Systems  Gastrointestinal:  Positive for abdominal pain.  Neurological:  Positive for headaches.  All other systems reviewed and are negative. Physical Exam   Blood pressure (!) 101/57, pulse 78, temperature 99.1 F (37.3 C), temperature source Oral, resp. rate 16, last menstrual period 01/11/2020, SpO2 98 %, currently breastfeeding.  Physical Exam Vitals and nursing note reviewed. Exam conducted with a chaperone present.  Constitutional:      General: She is not in acute distress.    Appearance: She is well-developed. She is not ill-appearing.  Cardiovascular:     Rate and Rhythm: Normal rate and regular rhythm.     Heart sounds: Normal heart sounds.  Pulmonary:     Effort: Pulmonary effort is normal.     Breath sounds: Normal breath sounds.  Abdominal:     Palpations: Abdomen is soft.     Tenderness: There is no abdominal tenderness.    Skin:    Capillary Refill: Capillary refill takes less than 2 seconds.  Neurological:     Mental Status: She is alert.  Psychiatric:        Mood and Affect: Mood normal.        Behavior: Behavior normal.    MAU Course  Procedures  --PEC labs WNL --Normotensive throughout evaluation in MAU --Patient sleeping 45 min after headache cocktail. Woke up when CNM approached bedside and reported pain 5/10. Discussed that sleeping is sign of well managed pain. Blood pressure medicine may also be responsible for lingering headache --EMR reflects workup for chest pain on 08/19 including Troponin, ECG and CXR --Patient speaking in full sentences without difficulty, no increased WOB noted --Discussed with Dr. Charlotta Newton. Patient stable for discharge home  Orders Placed This Encounter  Procedures   CBC   Comprehensive metabolic panel   ED EKG   Blood draw with IV start   Discharge patient   Patient Vitals for the past 24 hrs:  BP Temp Temp src Pulse Resp SpO2  09/18/20 1630  110/63 -- -- 89 -- 98 %  09/18/20 1615 (!) 101/57 -- -- 78 -- 98 %  09/18/20 1601 (!) 106/59 -- -- 81 -- --  09/18/20 1545 125/85 -- -- 84 -- 97 %  09/18/20 1530 119/85 -- -- 84 -- 97 %  09/18/20 1516 99/60 -- -- 83 -- --  09/18/20 1500 117/76 -- -- 90 -- 97 %  09/18/20 1445 117/78 -- --  94 -- 97 %  09/18/20 1430 125/82 -- -- 97 -- 98 %  09/18/20 1423 132/85 -- -- (!) 111 -- --  09/18/20 1417 133/85 99.1 F (37.3 C) Oral (!) 105 16 98 %   Results for orders placed or performed during the hospital encounter of 09/18/20 (from the past 24 hour(s))  CBC     Status: Abnormal   Collection Time: 09/18/20  2:44 PM  Result Value Ref Range   WBC 7.5 4.0 - 10.5 K/uL   RBC 3.60 (L) 3.87 - 5.11 MIL/uL   Hemoglobin 9.5 (L) 12.0 - 15.0 g/dL   HCT 27.5 (L) 17.0 - 01.7 %   MCV 85.3 80.0 - 100.0 fL   MCH 26.4 26.0 - 34.0 pg   MCHC 30.9 30.0 - 36.0 g/dL   RDW 49.4 49.6 - 75.9 %   Platelets 360 150 - 400 K/uL   nRBC 0.3 (H) 0.0 - 0.2 %  Comprehensive metabolic panel     Status: Abnormal   Collection Time: 09/18/20  2:44 PM  Result Value Ref Range   Sodium 134 (L) 135 - 145 mmol/L   Potassium 3.9 3.5 - 5.1 mmol/L   Chloride 104 98 - 111 mmol/L   CO2 22 22 - 32 mmol/L   Glucose, Bld 81 70 - 99 mg/dL   BUN 12 6 - 20 mg/dL   Creatinine, Ser 1.63 0.44 - 1.00 mg/dL   Calcium 8.9 8.9 - 84.6 mg/dL   Total Protein 7.1 6.5 - 8.1 g/dL   Albumin 3.3 (L) 3.5 - 5.0 g/dL   AST 22 15 - 41 U/L   ALT 32 0 - 44 U/L   Alkaline Phosphatase 55 38 - 126 U/L   Total Bilirubin 0.3 0.3 - 1.2 mg/dL   GFR, Estimated >65 >99 mL/min   Anion gap 8 5 - 15  Troponin I (High Sensitivity)     Status: None   Collection Time: 09/18/20  2:44 PM  Result Value Ref Range   Troponin I (High Sensitivity) 16 <18 ng/L    Assessment and Plan  --30 y.o. [id number] postpartum patient --S/p vaginal delivery 09/12/2020 --Pregnancy c/b Severe Preeclampsia  --No acute findings in MAU --Normal ECG in MAU --Discharge home in stable  condition  F/U: Patient has BP check tomorrow at [facility], CNM 09/18/2020, 8:09 PM

## 2020-09-19 ENCOUNTER — Other Ambulatory Visit: Payer: Self-pay

## 2020-09-19 ENCOUNTER — Ambulatory Visit (INDEPENDENT_AMBULATORY_CARE_PROVIDER_SITE_OTHER): Payer: Medicaid Other

## 2020-09-19 DIAGNOSIS — O1493 Unspecified pre-eclampsia, third trimester: Secondary | ICD-10-CM

## 2020-09-19 NOTE — Progress Notes (Signed)
Patient seen and assessed by nursing staff.  Agree with documentation and plan.  

## 2020-09-19 NOTE — Progress Notes (Signed)
Pt here today for PP BP check. Denies headaches, except when taking BP meds, denies swelling, visual changes. Pt states was seen at MAU yesterday for elevated BP and symptoms. Pt states feels better today and is taking BP meds as directed. Pt delivered NSVD on 09/12/20 with Pre-E.   BP: 140/85   Pt advised to continue to take BP meds as directed until PP visit on 9/20. Pt agreeable to plan of care. Pt advised to see ER if has increasing HTN symptoms and to continue to take BP and place in [facility].  Pt verbalized understanding.   Judeth Cornfield, RN

## 2020-09-22 ENCOUNTER — Inpatient Hospital Stay (HOSPITAL_COMMUNITY)
Admission: AD | Admit: 2020-09-22 | Discharge: 2020-09-22 | Disposition: A | Discharge status: Home / Self Care | Payer: Medicaid Other | Attending: Obstetrics and Gynecology | Admitting: Obstetrics and Gynecology

## 2020-09-22 ENCOUNTER — Other Ambulatory Visit: Payer: Self-pay

## 2020-09-22 DIAGNOSIS — Z87891 Personal history of nicotine dependence: Secondary | ICD-10-CM | POA: Diagnosis not present

## 2020-09-22 DIAGNOSIS — O99893 Other specified diseases and conditions complicating puerperium: Secondary | ICD-10-CM | POA: Diagnosis not present

## 2020-09-22 DIAGNOSIS — N644 Mastodynia: Secondary | ICD-10-CM | POA: Insufficient documentation

## 2020-09-22 NOTE — MAU Note (Signed)
Vag del 8/17. Rt breast is really tender, when she was pumping she felt or lump or knot.  Reached out to dr through my chart, was told she needed to be seen.

## 2020-09-22 NOTE — MAU Provider Note (Signed)
History     CSN: [id number]  Arrival date and time: 09/22/20 1343   Event Date/Time   First Provider Initiated Contact with Patient 09/22/20 1506      Chief Complaint  Patient presents with   Breast Mass   Breast Pain   HPI Heather Malone is a 30 y.o. [id number] postpartum from a vaginal delivery on 8/17 who presents with right breast pain. She states on 8/25, she started having pain when she pumps under her breast. She reports last night, she noticed a lump in the same spot. She denies any redness, fever or change in milk supply.   OB History     Gravida  2   Para  2   Term  1   Preterm  1   AB  0   Living  2      SAB  0   IAB  0   Ectopic  0   Multiple  0   Live Births  2           Past Medical History:  Diagnosis Date   Anemia    Anxiety    Asthma    albuterol prn   Headache    migraines   Hx of trichomoniasis    Hx of varicella    Neck injury, subsequent encounter 07/26/2016   Placental abruption in third trimester 09/04/2014   Subchorionic hemorrhage in third trimester 07/26/2014   Velamentous insertion of umbilical cord     Past Surgical History:  Procedure Laterality Date   TONSILLECTOMY      Family History  Problem Relation Age of Onset   Hypertension Mother    Hypertension Maternal Grandmother    Heart attack Maternal Grandmother    Hypertension Maternal Grandfather    Cancer Maternal Grandfather    Diabetes Paternal Grandmother     Social History   Tobacco Use   Smoking status: Former    Types: Cigars   Smokeless tobacco: Never  Vaping Use   Vaping Use: Never used  Substance Use Topics   Alcohol use: Not Currently   Drug use: Not Currently    Types: Marijuana    Comment: none with + UPT    Allergies: No Known Allergies  Medications Prior to Admission  Medication Sig Dispense Refill Last Dose   acetaminophen (TYLENOL) 325 MG tablet Take 2 tablets (650 mg total) by mouth every 6 (six) hours as needed for moderate  pain (for pain scale < 4).      furosemide (LASIX) 20 MG tablet Take 1 tablet (20 mg total) by mouth 2 (two) times daily. 10 tablet 0    ibuprofen (ADVIL) 600 MG tablet Take 1 tablet (600 mg total) by mouth every 6 (six) hours. 30 tablet 0    iron polysaccharides (NIFEREX) 150 MG capsule Take 1 capsule (150 mg total) by mouth daily. 30 capsule 2    lisinopril (ZESTRIL) 20 MG tablet Take 1 tablet (20 mg total) by mouth daily. 30 tablet 1    NIFEdipine (PROCARDIA XL/NIFEDICAL-XL) 90 MG 24 hr tablet Take 1 tablet (90 mg total) by mouth daily. 30 tablet 0    omeprazole (PRILOSEC) 20 MG capsule Take 1 capsule (20 mg total) by mouth daily. (Patient taking differently: Take 20 mg by mouth daily as needed (indigestion).) 60 capsule 2    oxyCODONE (OXY IR/ROXICODONE) 5 MG immediate release tablet Take 1 tablet (5 mg total) by mouth every 4 (four) hours as needed for severe pain. 30  tablet 0    Prenatal Vit-Fe Fumarate-FA (PRENATAL MULTIVITAMIN) TABS tablet Take 1 tablet by mouth daily at 12 noon.      sertraline (ZOLOFT) 50 MG tablet Take 1 tablet (50 mg total) by mouth daily. 90 tablet 4     Review of Systems  Constitutional: Negative.  Negative for fatigue and fever.  HENT: Negative.    Respiratory: Negative.  Negative for shortness of breath.   Cardiovascular: Negative.  Negative for chest pain.  Gastrointestinal: Negative.  Negative for abdominal pain, constipation, diarrhea, nausea and vomiting.  Genitourinary: Negative.  Negative for dysuria, vaginal bleeding and vaginal discharge.       Breast pain  Neurological: Negative.  Negative for dizziness and headaches.  Physical Exam   Blood pressure 115/82, pulse 87, temperature 98.3 F (36.8 C), temperature source Oral, resp. rate 16, SpO2 98 %, currently breastfeeding.  Physical Exam Vitals and nursing note reviewed.  Constitutional:      General: She is not in acute distress.    Appearance: She is well-developed.  HENT:     Head:  Normocephalic.  Eyes:     Pupils: Pupils are equal, round, and reactive to light.  Cardiovascular:     Rate and Rhythm: Normal rate and regular rhythm.     Heart sounds: Normal heart sounds.  Pulmonary:     Effort: Pulmonary effort is normal. No respiratory distress.     Breath sounds: Normal breath sounds.  Chest:       Comments: No redness or striations on breast tissue. Pain is localized to one area Abdominal:     General: Bowel sounds are normal. There is no distension.     Palpations: Abdomen is soft.     Tenderness: There is no abdominal tenderness.  Skin:    General: Skin is warm and dry.  Neurological:     Mental Status: She is alert and oriented to person, place, and time.  Psychiatric:        Mood and Affect: Mood normal.        Behavior: Behavior normal.        Thought Content: Thought content normal.        Judgment: Judgment normal.    MAU Course  Procedures  MDM Discussed plugged ducts at length and how to relieve. Warning signs of mastitis reviewed.   Assessment and Plan   1. Breast pain   2. Postpartum state    -Discharge home in stable condition -Mastitis precautions discussed -Patient advised to follow-up with [facility] as scheduled for postpartum care -Patient may return to MAU as needed or if her condition were to change or worsen   Rolm Bookbinder CNM 09/22/2020, 3:06 PM

## 2020-09-22 NOTE — Discharge Instructions (Signed)
Continue to pump every 2-3 hours around the clock, even at night. Use massage before and during pumping to help loosten the plugged duct. Pump in a position where your breasts dangle (hands and knees) to help facilitate the movement of the plug. You can also soak in a basin of warm water before pumping.   Take Sunflower lethicin to help prevent future plugged ducts

## 2020-09-24 ENCOUNTER — Telehealth (HOSPITAL_COMMUNITY): Payer: Self-pay | Admitting: *Deleted

## 2020-09-24 DIAGNOSIS — Z1331 Encounter for screening for depression: Secondary | ICD-10-CM

## 2020-09-24 NOTE — Telephone Encounter (Signed)
Left message to return nurse call. Notified Dr. Crissie Reese via [employer] message that referral will made to Integrative Behavioral Health for an EPDS of 11 in hospital with no follow-up.   Duffy Rhody, RN 09-24-2020 at 12:10pm

## 2020-09-25 ENCOUNTER — Encounter: Payer: Medicaid Other | Admitting: Physical Therapy

## 2020-09-25 NOTE — BH Specialist Note (Signed)
Integrated Behavioral Health via Telemedicine Visit  09/25/2020 Heather Malone [id number]  Number of Integrated Behavioral Health visits: 1 Session Start time: 10:27  Session End time: 10:54 Total time:  27  Referring Provider: Merian Capron, MD Patient/Family location: Home [facility] Provider location: [facility]  All persons participating in visit: [name] and [facility] Heather Malone   Types of Service: Individual psychotherapy and Video visit  I connected with Heather Malone and/or Heather Malone's  n/a  via  Telephone or Video Enabled Telemedicine Application  (Video is Caregility application) and verified that I am speaking with the correct person using two identifiers. Discussed confidentiality: Yes   I discussed the limitations of telemedicine and the availability of in person appointments.  Discussed there is a possibility of technology failure and discussed alternative modes of communication if that failure occurs.  I discussed that engaging in this telemedicine visit, they consent to the provision of behavioral healthcare and the services will be billed under their insurance.  Patient and/or legal guardian expressed understanding and consented to Telemedicine visit: Yes   Presenting Concerns: Patient and/or family reports the following symptoms/concerns: Anxiety and depression, increased prior to baby's discharge from hospital and adjusting to new motherhood with two children; baby home from hospital yesterday, pt is sleeping and eating well; FOB and pt's mother are her greatest supports. No additional questions or concerns at this time Duration of problem: Postpartum; Severity of problem: mild  Patient and/or Family's Strengths/Protective Factors: Social connections, Concrete supports in place (healthy food, safe environments, etc.), and Sense of purpose  Goals Addressed: Patient will:  Reduce  symptoms of: anxiety and depression   Increase knowledge and/or ability of: healthy habits   Demonstrate ability to: Increase healthy adjustment to current life circumstances  Progress towards Goals: Ongoing  Interventions: Interventions utilized:  Medication Monitoring, Psychoeducation and/or Health Education, and Link to [employer] Standardized Assessments completed: GAD-7 and PHQ 9  Patient and/or Family Response: Pt agrees with treatment plan  Assessment: Patient currently experiencing Other specified counseling.   Patient may benefit from psychoeducation and brief therapeutic interventions regarding coping with symptoms of anxiety, depression .  Plan: Follow up with behavioral health clinician on : Call Heather Malone as needed at [phone] Behavioral recommendations:  -Continue taking prenatal vitamin until postpartum medical visit -Continue taking Zoloft as prescribed -Continue accepting practical support from loved ones during postpartum time -Consider registering for and attending new mom online support group for additional support, as needed (prior to return to work-from-home) -Establish with PCP (if no PCP assigned by [facility] plan) Referral(s): Integrated Art gallery manager (In Clinic) and [employer]:  New mom support  I discussed the assessment and treatment plan with the patient and/or parent/guardian. They were provided an opportunity to ask questions and all were answered. They agreed with the plan and demonstrated an understanding of the instructions.   They were advised to call back or seek an in-person evaluation if the symptoms worsen or if the condition fails to improve as anticipated.  Rae Lips, LCSW  Depression screen [facility] 2/9 10/02/2020 08/28/2020 08/02/2020 07/18/2020 07/04/2020  Decreased Interest 1 0 0 1 0  Down, Depressed, Hopeless 2 0 0 0 0  PHQ - 2 Score 3 0 0 1 0  Altered sleeping 0 2 1 1 1   Tired, decreased energy 1 1 1 1 1    Change in appetite 0 0 0 0 0  Feeling bad or  failure about yourself  1 0 0 0 0  Trouble concentrating 0 0 0 0 0  Moving slowly or fidgety/restless 0 0 0 0 0  Suicidal thoughts 0 0 0 0 0  PHQ-9 Score 5 3 2 3 2   Difficult doing work/chores - - - - -  Some recent data might be hidden   GAD 7 : Generalized Anxiety Score 10/02/2020 08/28/2020 08/02/2020 07/18/2020  Nervous, Anxious, on Edge 2 0 0 0  Control/stop worrying 1 0 0 0  Worry too much - different things 1 0 0 0  Trouble relaxing 0 1 1 1   Restless 0 0 0 0  Easily annoyed or irritable 1 1 0 0  Afraid - awful might happen 0 0 0 0  Total GAD 7 Score 5 2 1  1

## 2020-09-26 ENCOUNTER — Ambulatory Visit: Payer: Self-pay

## 2020-09-26 NOTE — Lactation Note (Signed)
This note was copied from a baby's chart. Lactation Consultation Note I spoke with Nariah's mother today by phone. She was at [facility] department picking up an electric pump. Until this time, she has pumped with a hand pump at home. From her recall, she is pumping q3 with volumes of 20-55mLs per pumping. I expect her volume to increase with electric pump use. I will plan a f/u visit with her next week in the NICU.   Patient Name: Girl Laquasha Groome CZYSA'Y Date: 09/26/2020   Age:35 wk.o.  Feeding Nipple Type: Nfant Extra Slow Flow (gold)  Consult Status Date: 09/18/20   Elder Negus, MA IBCLC 09/26/2020, 2:56 PM

## 2020-09-28 ENCOUNTER — Ambulatory Visit: Payer: Self-pay

## 2020-09-28 NOTE — Lactation Note (Signed)
This note was copied from a baby's chart. Lactation Consultation Note  Patient Name: Heather Malone [id number] Date: 09/28/2020 Reason for consult: Follow-up assessment;NICU baby;Late-preterm 34-36.6wks Age:30 wk.o.  Visited with mom of 36 2/7 (adjusted) NICU female, she reports pumping consistently and obtaining slightly larger amounts of EBM, but still BNL.  Mom has a concerned about a possible plugged duct, she told LC she's been treating it with massage and warm compresses but it won't go away. LC noticed a very small mass (smaller than a pinky fingernail) around 6-7 O'clock on lower quadrant on right breast.  Mom reports no pain/discomfort, no tenderness to pressure or touch. Breast feel soft with no signs of engorgement either.  Plan of care:   Encouraged mom to continue pumping every 2-3 hours, at least 8 pumping sessions/24 hours She'll start doing cold/hot therapies and possibly add Lecithin if Ok'd by her provider.   No other support person at this time. All questions and concerns answered, mom to call NICU LC PRN.  Maternal Data   Mom's milk supply is BNL probably due to lack of bilateral pumping during the first two weeks post partum.  Feeding Mother's Current Feeding Choice: Breast Milk and Donor Milk Nipple Type: Nfant Extra Slow Flow (gold)  Lactation Tools Discussed/Used Tools: Pump;Flanges Flange Size: 24 Breast pump type: Double-Electric Breast Pump Pump Education: Setup, frequency, and cleaning;Milk Storage Reason for Pumping: LPI in NICU Pumping frequency: q 2-3 hours Pumped volume: 25 mL (25-30 ml)  Interventions Interventions: Breast feeding basics reviewed;DEBP;Education;Breast massage  Discharge Pump: DEBP  Consult Status Consult Status: Follow-up Date: 09/28/20 Follow-up type: In-patient   Chau Sawin Venetia Constable 09/28/2020, 12:41 PM

## 2020-09-30 ENCOUNTER — Ambulatory Visit: Payer: Self-pay

## 2020-09-30 NOTE — Lactation Note (Signed)
This note was copied from a baby's chart. Lactation Consultation Note  Patient Name: Heather Malone Date: 09/30/2020 Reason for consult: Follow-up assessment;NICU baby;Late-preterm 34-36.6wks;Infant < 6lbs Age:30 wk.o.  Visited with mom of 61 71/74 weeks old (adjusted) NICU female, she requested a feeding assist. Baby has transitioned to ad lib feedings and she was already awake when entered the room.  Assisted mom with latching and positioning to the left breast in cross cradle hold, baby latched after a couple of attempts, but would lose depth as she slowed down towards the 12 minutes feeding.  Reported to NICU RN Morrie Sheldon that considering mom's supply and baby's weight/stamina, she'd still have to get her bottle after feedings at the breast, encouraged mom to limit feedings at the breast to no more than 20-30 minutes at a time.  Plan of care:  Encouraged mom to continue pumping consistently every 2-3 hours even if baby has already taken the breast. Mom understands that this is critical to protect her supply She'll continue taking baby to breast on feeding cues She'll supplement baby with a bottle after feedings at the breast following SLP plan  GOB present and supportive. Family reported all questions and concerns were answered, they're both aware of NICU LC services and will call PRN.  Maternal Data   Mom's supply is BNL; possibly due to delayed in starting bilateral pumping after maternal discharge  Feeding Mother's Current Feeding Choice: Breast Milk and Formula Nipple Type: Dr. Levert Feinstein Preemie  LATCH Score Latch: Repeated attempts needed to sustain latch, nipple held in mouth throughout feeding, stimulation needed to elicit sucking reflex.  Audible Swallowing: A few with stimulation  Type of Nipple: Everted at rest and after stimulation  Comfort (Breast/Nipple): Soft / non-tender  Hold (Positioning): Assistance needed to correctly position infant at breast  and maintain latch.  LATCH Score: 7   Lactation Tools Discussed/Used Tools: Pump;Flanges Flange Size: 24 Breast pump type: Double-Electric Breast Pump Pump Education: Setup, frequency, and cleaning;Milk Storage Reason for Pumping: LPI in NICU Pumping frequency: q 2-3 hours Pumped volume: 25 mL (25-30 ml)  Interventions Interventions: Breast feeding basics reviewed;DEBP;Assisted with latch;Breast massage;Breast compression;Adjust position;Education  Discharge Pump: DEBP  Consult Status Consult Status: Follow-up Date: 09/30/20 Follow-up type: In-patient   Heather Malone 09/30/2020, 5:15 PM

## 2020-10-01 ENCOUNTER — Ambulatory Visit: Payer: Self-pay

## 2020-10-01 NOTE — Lactation Note (Signed)
This note was copied from a baby's chart. Lactation Consultation Note LC observed a 15-minute bf'ing with good effort but inconsistent swallowing. WNL for 36-weeker. Mother's milk supply is below normal but will likely increase with good breast stimulation. Until baby becomes more consistent, supplementation q bf'ing should continue. LC recommended 15-minutes at breast followed by bottle feeding. Will refer to outpatient LC for continued care. Mother should continue to pump p bf'ing prn.   Patient Name: [name] [id number] Date: 10/01/2020 Reason for consult: Follow-up assessment (infant d/c) Age:30 wk.o.  Maternal Data  25-7mL 7xday  Feeding Mother's Current Feeding Choice: Breast Milk and Formula Nipple Type: Dr. Levert Feinstein Preemie  LATCH Score Latch: Grasps breast easily, tongue down, lips flanged, rhythmical sucking.  Audible Swallowing: A few with stimulation  Type of Nipple: Everted at rest and after stimulation  Comfort (Breast/Nipple): Soft / non-tender  Hold (Positioning): Assistance needed to correctly position infant at breast and maintain latch.  LATCH Score: 8  Interventions Interventions: Education;Adjust position;Breast compression  Discharge Discharge Education: Outpatient Epic message sent;Outpatient recommendation  Consult Status Consult Status: Complete Date: 10/01/20 Follow-up type: In-patient  Elder Negus 10/01/2020, 2:12 PM

## 2020-10-02 ENCOUNTER — Ambulatory Visit (INDEPENDENT_AMBULATORY_CARE_PROVIDER_SITE_OTHER): Payer: Medicaid Other | Admitting: Clinical

## 2020-10-02 DIAGNOSIS — Z7189 Other specified counseling: Secondary | ICD-10-CM | POA: Diagnosis not present

## 2020-10-02 NOTE — Patient Instructions (Signed)
Center for [facility] [address] [place], [place] [address] [phone] (main office) [phone] (Tala Eber's office)  New mom support groups: Www.postpartum.net Www.conehealthybaby.com   [place] primary care offices possibly accepting new patients:   Primary Care at [facility] [address] [place], [place] [address] [phone]  [facility] [address] [place], [place] [address] [phone]  [facility] [address] [place], [place] [address] [phone]  [facility] [address] [place], [place] [address] [phone]  [facility] [address] [place],  [place]  [address] [phone] /Emotional Wellbeing Apps and Websites Here are a few free apps meant to help you to help yourself.  To find, try searching on the internet to see if the app is offered on Apple/Android devices. If your first choice doesn't come up on your device, the good news is that there are many choices! Play around with different apps to see which ones are helpful to you.    Calm This is an app meant to help increase calm feelings. Includes info, strategies, and tools for tracking your feelings.      Calm Harm  This app is meant to help with self-harm. Provides many 5-minute or 15-min coping strategies for doing instead of hurting yourself.       Healthy Minds Health Minds is a problem-solving tool to help deal with emotions and cope with stress you encounter wherever you are.      MindShift This app can help people cope with anxiety. Rather than trying to avoid anxiety, you can make an important shift and face it.      MY3  MY3 features a support system, safety plan and resources with the goal of offering a tool to use in a time of need.       My Life My Voice  This mood journal offers a simple solution for tracking your  thoughts, feelings and moods. Animated emoticons can help identify your mood.       Relax Melodies Designed to help with sleep, on this app you can mix sounds and meditations for relaxation.      Smiling Mind Smiling Mind is meditation made easy: it's a simple tool that helps put a smile on your mind.        Stop, Breathe & Think  A friendly, simple guide for people through meditations for mindfulness and compassion.  Stop, Breathe and Think Kids Enter your current feelings and choose a "mission" to help you cope. Offers videos for certain moods instead of just sound recordings.       Team Orange The goal of this tool is to help teens change how they think, act, and react. This app helps you focus on your own good feelings and experiences.      The [employer] Box The [employer] Box (VHB) contains simple tools to help patients with coping, relaxation, distraction, and positive thinking.

## 2020-10-09 ENCOUNTER — Encounter: Payer: Medicaid Other | Admitting: Physical Therapy

## 2020-10-16 ENCOUNTER — Encounter: Payer: Medicaid Other | Admitting: Physical Therapy

## 2020-10-16 ENCOUNTER — Ambulatory Visit: Payer: Medicaid Other

## 2020-11-02 ENCOUNTER — Encounter: Payer: Self-pay | Admitting: Radiology

## 2020-12-03 ENCOUNTER — Other Ambulatory Visit (HOSPITAL_COMMUNITY)
Admission: RE | Admit: 2020-12-03 | Discharge: 2020-12-03 | Disposition: A | Discharge status: Home / Self Care | Payer: Medicaid Other | Source: Ambulatory Visit

## 2020-12-03 ENCOUNTER — Other Ambulatory Visit: Payer: Self-pay

## 2020-12-03 ENCOUNTER — Ambulatory Visit (INDEPENDENT_AMBULATORY_CARE_PROVIDER_SITE_OTHER): Payer: Medicaid Other | Admitting: Obstetrics & Gynecology

## 2020-12-03 DIAGNOSIS — N898 Other specified noninflammatory disorders of vagina: Secondary | ICD-10-CM

## 2020-12-03 NOTE — Progress Notes (Signed)
Patient ID: Heather Malone, female   DOB: 12/23/1990, 30 y.o.   MRN: [id number]  Chief Complaint  Patient presents with   Follow-up   Vaginal discharge  HPI Heather Malone is a 30 y.o. female.  [id number] Patient's last menstrual period was 11/27/2020 (approximate). S/P SVD 8/17, c/o vaginal discharge and itching. She declines Rx BCM, not breastfeeding HPI  Past Medical History:  Diagnosis Date   Anemia    Anxiety    Asthma    albuterol prn   Headache    migraines   Hx of trichomoniasis    Hx of varicella    Neck injury, subsequent encounter 07/26/2016   Placental abruption in third trimester 09/04/2014   Subchorionic hemorrhage in third trimester 07/26/2014   Velamentous insertion of umbilical cord     Past Surgical History:  Procedure Laterality Date   TONSILLECTOMY      Family History  Problem Relation Age of Onset   Hypertension Mother    Hypertension Maternal Grandmother    Heart attack Maternal Grandmother    Hypertension Maternal Grandfather    Cancer Maternal Grandfather    Diabetes Paternal Grandmother     Social History Social History   Tobacco Use   Smoking status: Former    Types: Cigars   Smokeless tobacco: Never  Building services engineer Use: Never used  Substance Use Topics   Alcohol use: Not Currently   Drug use: Not Currently    Types: Marijuana    Comment: none with + UPT    No Known Allergies  Current Outpatient Medications  Medication Sig Dispense Refill   ibuprofen (ADVIL) 600 MG tablet Take 1 tablet (600 mg total) by mouth every 6 (six) hours. 30 tablet 0   Prenatal Vit-Fe Fumarate-FA (PRENATAL MULTIVITAMIN) TABS tablet Take 1 tablet by mouth daily at 12 noon.     sertraline (ZOLOFT) 50 MG tablet Take 1 tablet (50 mg total) by mouth daily. 90 tablet 4   acetaminophen (TYLENOL) 325 MG tablet Take 2 tablets (650 mg total) by mouth every 6 (six) hours as needed for moderate pain (for pain scale < 4). (Patient not taking: Reported on  12/03/2020)     iron polysaccharides (NIFEREX) 150 MG capsule Take 1 capsule (150 mg total) by mouth daily. (Patient not taking: Reported on 12/03/2020) 30 capsule 2   lisinopril (ZESTRIL) 20 MG tablet Take 1 tablet (20 mg total) by mouth daily. (Patient not taking: Reported on 12/03/2020) 30 tablet 1   NIFEdipine (PROCARDIA XL/NIFEDICAL-XL) 90 MG 24 hr tablet Take 1 tablet (90 mg total) by mouth daily. 30 tablet 0   omeprazole (PRILOSEC) 20 MG capsule Take 1 capsule (20 mg total) by mouth daily. (Patient taking differently: Take 20 mg by mouth daily as needed (indigestion).) 60 capsule 2   oxyCODONE (OXY IR/ROXICODONE) 5 MG immediate release tablet Take 1 tablet (5 mg total) by mouth every 4 (four) hours as needed for severe pain. (Patient not taking: Reported on 12/03/2020) 30 tablet 0   No current facility-administered medications for this visit.    Review of Systems Review of Systems  Constitutional: Negative.   Respiratory: Negative.    Gastrointestinal: Negative.   Genitourinary:  Positive for vaginal discharge. Negative for menstrual problem.  Psychiatric/Behavioral: Negative.     Blood pressure 119/84, pulse 77, height 5\' 2"  (1.575 m), weight 154 lb 12.8 oz (70.2 kg), last menstrual period 11/27/2020, not currently breastfeeding.  Physical Exam Physical Exam Vitals and nursing note reviewed.  Exam conducted with a chaperone present.  Constitutional:      Appearance: Normal appearance.  Genitourinary:    General: Normal vulva.     Exam position: Lithotomy position.     Vagina: Vaginal discharge present.     Cervix: Normal.  Neurological:     Mental Status: She is alert.  Psychiatric:        Mood and Affect: Mood normal.        Behavior: Behavior normal.    Data Reviewed Pap 03/2020 nl  Assessment Postpartum state  Vaginal discharge - Plan: Cervicovaginal ancillary only( [facility])   Plan F/u on test result, possibly BV on examination RTC yearly She will call if she  decides she needs contraception    Scheryl Darter 12/03/2020, 1:50 PM

## 2020-12-04 LAB — CERVICOVAGINAL ANCILLARY ONLY
Bacterial Vaginitis (gardnerella): POSITIVE — AB
Candida Glabrata: NEGATIVE
Candida Vaginitis: POSITIVE — AB
Chlamydia: NEGATIVE
Comment: NEGATIVE
Comment: NEGATIVE
Comment: NEGATIVE
Comment: NEGATIVE
Comment: NEGATIVE
Comment: NORMAL
Neisseria Gonorrhea: NEGATIVE
Trichomonas: NEGATIVE

## 2020-12-11 ENCOUNTER — Other Ambulatory Visit: Payer: Self-pay | Admitting: Obstetrics & Gynecology

## 2020-12-11 DIAGNOSIS — N898 Other specified noninflammatory disorders of vagina: Secondary | ICD-10-CM

## 2020-12-11 MED ORDER — FLUCONAZOLE 150 MG PO TABS: 150.0000 mg | ORAL_TABLET | Freq: Once | ORAL | 0 refills | Status: AC

## 2020-12-11 MED ORDER — METRONIDAZOLE 500 MG PO TABS: 500.0000 mg | ORAL_TABLET | Freq: Two times a day (BID) | ORAL | 0 refills | Status: DC

## 2021-07-10 ENCOUNTER — Encounter: Payer: Self-pay | Admitting: Certified Nurse Midwife

## 2021-07-10 ENCOUNTER — Ambulatory Visit (INDEPENDENT_AMBULATORY_CARE_PROVIDER_SITE_OTHER): Payer: Medicaid Other | Admitting: Certified Nurse Midwife

## 2021-07-10 ENCOUNTER — Other Ambulatory Visit (HOSPITAL_COMMUNITY)
Admission: RE | Admit: 2021-07-10 | Discharge: 2021-07-10 | Disposition: A | Discharge status: Home / Self Care | Payer: Medicaid Other | Source: Ambulatory Visit | Attending: Certified Nurse Midwife | Admitting: Certified Nurse Midwife

## 2021-07-10 VITALS — BP 117/82 | HR 83 | Wt 147.7 lb

## 2021-07-10 DIAGNOSIS — Z113 Encounter for screening for infections with a predominantly sexual mode of transmission: Secondary | ICD-10-CM

## 2021-07-10 DIAGNOSIS — Z3009 Encounter for other general counseling and advice on contraception: Secondary | ICD-10-CM

## 2021-07-10 MED ORDER — SLYND 4 MG PO TABS: 1.0000 | ORAL_TABLET | Freq: Every day | ORAL | 11 refills | Status: DC

## 2021-07-10 NOTE — Progress Notes (Signed)
History:  [name] is a 31 y.o. [id number] who presents to clinic today for birth control counseling and STI testing.  Was on COCs in high school but began having migraines with syncopal episodes. Depo made her gain a lot of trunk weight so she is not interested in trying that again. Does not want an IUD, may want a Nexplanon. Currently her periods are 3-4 days but somewhat irregular and very heavy.   The following portions of the patient's history were reviewed and updated as appropriate: allergies, current medications, family history, past medical history, social history, past surgical history and problem list.  Review of Systems:  Pertinent items noted in HPI and remainder of comprehensive ROS otherwise negative.   Objective:  Physical Exam BP 117/82   Pulse 83   Wt 147 lb 11.2 oz (67 kg)   LMP 06/23/2021 (Exact Date)   Breastfeeding No   BMI 27.01 kg/m  Physical Exam Vitals and nursing note reviewed.  Constitutional:      General: She is not in acute distress.    Appearance: Normal appearance. She is normal weight. She is not ill-appearing.  HENT:     Head: Normocephalic and atraumatic.  Eyes:     Pupils: Pupils are equal, round, and reactive to light.  Cardiovascular:     Rate and Rhythm: Normal rate and regular rhythm.  Pulmonary:     Effort: Pulmonary effort is normal.  Abdominal:     Palpations: Abdomen is soft.  Musculoskeletal:        General: Normal range of motion.  Skin:    General: Skin is warm and dry.     Capillary Refill: Capillary refill takes less than 2 seconds.  Neurological:     Mental Status: She is alert and oriented to person, place, and time.  Psychiatric:        Mood and Affect: Mood normal.        Behavior: Behavior normal.        Thought Content: Thought content normal.        Judgment: Judgment normal.    Labs and Imaging No results found for this or any previous visit (from the past 24 hour(s)).  No results  found.   Assessment & Plan:  1. Birth control counseling - Reviewed BC choices in stepwise manner, pt opted to try POPs. Does well taking pills daily. - Drospirenone (SLYND) 4 MG TABS; Take 1 tablet by mouth daily.  Dispense: 28 tablet; Refill: 11  2. Screening examination for STD (sexually transmitted disease) - HIV antibody (with reflex) - RPR - Hepatitis C Antibody - Hepatitis B Surface AntiGEN - Cervicovaginal ancillary only( [facility])  Follow up PRN or for annual exam.  Bernerd Limbo, CNM 07/10/2021 9:50 PM

## 2021-07-11 LAB — CERVICOVAGINAL ANCILLARY ONLY
Chlamydia: NEGATIVE
Comment: NEGATIVE
Comment: NEGATIVE
Comment: NORMAL
Neisseria Gonorrhea: NEGATIVE
Trichomonas: NEGATIVE

## 2021-07-11 LAB — RPR: RPR Ser Ql: NONREACTIVE

## 2021-07-11 LAB — HEPATITIS B SURFACE ANTIGEN: Hepatitis B Surface Ag: NEGATIVE

## 2021-07-11 LAB — HEPATITIS C ANTIBODY: Hep C Virus Ab: NONREACTIVE

## 2021-07-11 LAB — HIV ANTIBODY (ROUTINE TESTING W REFLEX): HIV Screen 4th Generation wRfx: NONREACTIVE

## 2021-10-01 ENCOUNTER — Encounter (HOSPITAL_BASED_OUTPATIENT_CLINIC_OR_DEPARTMENT_OTHER): Payer: Self-pay | Admitting: Pediatrics

## 2021-10-01 ENCOUNTER — Emergency Department (HOSPITAL_BASED_OUTPATIENT_CLINIC_OR_DEPARTMENT_OTHER)
Admission: EM | Admit: 2021-10-01 | Discharge: 2021-10-01 | Disposition: A | Discharge status: Home / Self Care | Payer: Medicaid Other | Attending: Emergency Medicine | Admitting: Emergency Medicine

## 2021-10-01 ENCOUNTER — Emergency Department (HOSPITAL_BASED_OUTPATIENT_CLINIC_OR_DEPARTMENT_OTHER): Payer: Medicaid Other

## 2021-10-01 ENCOUNTER — Other Ambulatory Visit: Payer: Self-pay

## 2021-10-01 DIAGNOSIS — N760 Acute vaginitis: Secondary | ICD-10-CM | POA: Diagnosis not present

## 2021-10-01 DIAGNOSIS — B9689 Other specified bacterial agents as the cause of diseases classified elsewhere: Secondary | ICD-10-CM

## 2021-10-01 DIAGNOSIS — R102 Pelvic and perineal pain: Secondary | ICD-10-CM | POA: Diagnosis present

## 2021-10-01 DIAGNOSIS — Z711 Person with feared health complaint in whom no diagnosis is made: Secondary | ICD-10-CM

## 2021-10-01 DIAGNOSIS — R590 Localized enlarged lymph nodes: Secondary | ICD-10-CM | POA: Diagnosis not present

## 2021-10-01 LAB — URINALYSIS, ROUTINE W REFLEX MICROSCOPIC
Glucose, UA: NEGATIVE mg/dL
Hgb urine dipstick: NEGATIVE
Ketones, ur: NEGATIVE mg/dL
Leukocytes,Ua: NEGATIVE
Nitrite: NEGATIVE
Protein, ur: NEGATIVE mg/dL
Specific Gravity, Urine: 1.025 (ref 1.005–1.030)
pH: 6 (ref 5.0–8.0)

## 2021-10-01 LAB — CBC
HCT: 35.2 % — ABNORMAL LOW (ref 36.0–46.0)
Hemoglobin: 11.6 g/dL — ABNORMAL LOW (ref 12.0–15.0)
MCH: 27.8 pg (ref 26.0–34.0)
MCHC: 33 g/dL (ref 30.0–36.0)
MCV: 84.4 fL (ref 80.0–100.0)
Platelets: 389 10*3/uL (ref 150–400)
RBC: 4.17 MIL/uL (ref 3.87–5.11)
RDW: 15.8 % — ABNORMAL HIGH (ref 11.5–15.5)
WBC: 4.4 10*3/uL (ref 4.0–10.5)
nRBC: 0 % (ref 0.0–0.2)

## 2021-10-01 LAB — PREGNANCY, URINE: Preg Test, Ur: NEGATIVE

## 2021-10-01 LAB — COMPREHENSIVE METABOLIC PANEL
ALT: 11 U/L (ref 0–44)
AST: 15 U/L (ref 15–41)
Albumin: 3.9 g/dL (ref 3.5–5.0)
Alkaline Phosphatase: 41 U/L (ref 38–126)
Anion gap: 6 (ref 5–15)
BUN: 9 mg/dL (ref 6–20)
CO2: 25 mmol/L (ref 22–32)
Calcium: 8.8 mg/dL — ABNORMAL LOW (ref 8.9–10.3)
Chloride: 106 mmol/L (ref 98–111)
Creatinine, Ser: 0.66 mg/dL (ref 0.44–1.00)
GFR, Estimated: >60 mL/min (ref >60)
Glucose, Bld: 92 mg/dL (ref 70–99)
Potassium: 3.6 mmol/L (ref 3.5–5.1)
Sodium: 137 mmol/L (ref 135–145)
Total Bilirubin: 0.6 mg/dL (ref 0.3–1.2)
Total Protein: 7.3 g/dL (ref 6.5–8.1)

## 2021-10-01 LAB — LIPASE, BLOOD: Lipase: 40 U/L (ref 11–51)

## 2021-10-01 LAB — WET PREP, GENITAL
Sperm: NONE SEEN
Trich, Wet Prep: NONE SEEN
WBC, Wet Prep HPF POC: >=10 — AB (ref <10)
Yeast Wet Prep HPF POC: NONE SEEN

## 2021-10-01 MED ORDER — METRONIDAZOLE 500 MG PO TABS
500.0000 mg | ORAL_TABLET | Freq: Two times a day (BID) | ORAL | 0 refills | Status: DC
Start: 2021-10-01 — End: 2022-07-28

## 2021-10-01 MED ORDER — LIDOCAINE HCL (PF) 1 % IJ SOLN
INTRAMUSCULAR | Status: AC
  Administered 2021-10-01: 2 mL
  Filled 2021-10-01: qty 5

## 2021-10-01 MED ORDER — CEFTRIAXONE SODIUM 500 MG IJ SOLR
500.0000 mg | Freq: Once | INTRAMUSCULAR | Status: AC
  Administered 2021-10-01: 500 mg via INTRAMUSCULAR
  Filled 2021-10-01: qty 500

## 2021-10-01 MED ORDER — DOXYCYCLINE HYCLATE 100 MG PO CAPS
100.0000 mg | ORAL_CAPSULE | Freq: Two times a day (BID) | ORAL | 0 refills | Status: DC
Start: 2021-10-01 — End: 2022-07-28

## 2021-10-01 NOTE — Discharge Instructions (Signed)
As we discussed, I have given you a prescription for 2 antibiotics for management of your symptoms.  Please fill these prescriptions and take them in their entirety as prescribed.  Additionally, your gonorrhea and chlamydia swab is pending at discharge and will result in the next 24 to 48 hours.  Please monitor these results on your MyChart and if positive, you will need to inform all sexual partners of positive test result and abstain from sexual intercourse until you are able to see follow-up with OB/GYN for test of cure.  Also, please do not drink alcohol while you are taking metronidazole as it can cause adverse side effects.  Return if development of any new or worsening symptoms.

## 2021-10-01 NOTE — ED Provider Notes (Signed)
[facility] EMERGENCY DEPARTMENT Provider Note   CSN: [id number] Arrival date & time: 10/01/21  1012     History  Chief Complaint  Patient presents with   Abdominal Pain    Heather Malone is a 31 y.o. female.  Patient with history of asthma presents today with complaints of pelvic pain. She states that same has been ongoing for the past 3 days. States that around the same time she noticed swollen "lumps" in her groin bilaterally which are tender to palpation. States that she did have some nausea without vomiting or diarrhea at the onset of her symptoms. Initially, she thought that her pain was related to her menstrual cycle which she just came off of, but states her pain has continued to be persistent. She denies any vaginal discharge, hematuria, or dysuria. She is sexually active.   The history is provided by the patient. No language interpreter was used.  Abdominal Pain      Home Medications Prior to Admission medications   Medication Sig Start Date End Date Taking? Authorizing Provider  Drospirenone (SLYND) 4 MG TABS Take 1 tablet by mouth daily. 07/10/21   Bernerd Limbo, CNM      Allergies    Patient has no known allergies.    Review of Systems   Review of Systems  Gastrointestinal:  Positive for abdominal pain.  All other systems reviewed and are negative.   Physical Exam Updated Vital Signs BP (!) 121/92 (BP Location: Right Arm)   Pulse 65   Temp 98.1 F (36.7 C) (Oral)   Resp 18   Ht 5\' 2"  (1.575 m)   Wt 72.6 kg   LMP 09/23/2021   SpO2 97%   BMI 29.26 kg/m  Physical Exam Vitals and nursing note reviewed. Exam conducted with a chaperone present.  Constitutional:      General: She is not in acute distress.    Appearance: Normal appearance. She is normal weight. She is not ill-appearing, toxic-appearing or diaphoretic.  HENT:     Head: Normocephalic and atraumatic.  Cardiovascular:     Rate and Rhythm: Normal rate.  Pulmonary:      Effort: Pulmonary effort is normal. No respiratory distress.  Abdominal:     General: Abdomen is flat.     Palpations: Abdomen is soft.     Tenderness: There is no abdominal tenderness.  Genitourinary:    Vagina: Vaginal discharge present.     Cervix: Cervical motion tenderness present.     Uterus: Normal.      Adnexa:        Right: No mass, tenderness or fullness.         Left: No mass, tenderness or fullness.       Comments: Enlarged tender well circumscribed lymph nodes present in the groin bilaterally.  Musculoskeletal:        General: Normal range of motion.     Cervical back: Normal range of motion.  Skin:    General: Skin is warm and dry.  Neurological:     General: No focal deficit present.     Mental Status: She is alert.  Psychiatric:        Mood and Affect: Mood normal.        Behavior: Behavior normal.     ED Results / Procedures / Treatments   Labs (all labs ordered are listed, but only abnormal results are displayed) Labs Reviewed  WET PREP, GENITAL - Abnormal; Notable for the following components:  Result Value   Clue Cells Wet Prep HPF POC PRESENT (*)    WBC, Wet Prep HPF POC >=10 (*)    All other components within normal limits  COMPREHENSIVE METABOLIC PANEL - Abnormal; Notable for the following components:   Calcium 8.8 (*)    All other components within normal limits  CBC - Abnormal; Notable for the following components:   Hemoglobin 11.6 (*)    HCT 35.2 (*)    RDW 15.8 (*)    All other components within normal limits  URINALYSIS, ROUTINE W REFLEX MICROSCOPIC - Abnormal; Notable for the following components:   APPearance HAZY (*)    Bilirubin Urine SMALL (*)    All other components within normal limits  LIPASE, BLOOD  PREGNANCY, URINE  GC/CHLAMYDIA PROBE AMP ([place] [facility]) NOT AT [facility]    EKG None  Radiology US PELVIC COMPLETE W TRANSVAGINAL AND TORSION R/O  Result Date: 10/01/2021 CLINICAL DATA:  Pelvic pain. EXAM: TRANSABDOMINAL AND  TRANSVAGINAL ULTRASOUND OF PELVIS DOPPLER ULTRASOUND OF OVARIES TECHNIQUE: Both transabdominal and transvaginal ultrasound examinations of the pelvis were performed. Transabdominal technique was performed for global imaging of the pelvis including uterus, ovaries, adnexal regions, and pelvic cul-de-sac. It was necessary to proceed with endovaginal exam following the transabdominal exam to visualize the endometrium and ovaries. Color and duplex Doppler ultrasound was utilized to evaluate blood flow to the ovaries. COMPARISON:  None Available. FINDINGS: Uterus Measurements: 8.8 x 5.0 x 4.6 cm = volume: 105 mL. No fibroids or other mass visualized. Endometrium Thickness: 5 mm which is within normal limits. No focal abnormality visualized. Right ovary Measurements: 5.7 x 5.0 x 4.2 cm = volume: 63 mL. 3.3 x 1.6 cm complex cystic abnormality is seen adjacent to or arising from the right ovary. Left ovary Measurements: 4.1 x 3.1 x 2.9 cm = volume: 19 mL. Normal appearance/no adnexal mass. Pulsed Doppler evaluation of both ovaries demonstrates normal low-resistance arterial and venous waveforms. Other findings No abnormal free fluid. IMPRESSION: No evidence of ovarian torsion. 3.3 x 1.6 cm complex cystic abnormality is seen adjacent to or arising from the right ovary. Potentially this may represent hemorrhagic cyst. Short-interval follow up ultrasound in 6-12 weeks is recommended, preferably during the week following the patient's normal menses. Electronically Signed   By: Lupita Raider M.D.   On: 10/01/2021 16:33    Procedures Procedures    Medications Ordered in ED Medications  cefTRIAXone (ROCEPHIN) injection 500 mg (has no administration in time range)    ED Course/ Medical Decision Making/ A&P                           Medical Decision Making Amount and/or Complexity of Data Reviewed Labs: ordered.   This patient presents to the ED for concern of inguinal lymphadenopathy, this involves an extensive  number of treatment options, and is a complaint that carries with it a high risk of complications and morbidity.   Co morbidities that complicate the patient evaluation  None   Additional history obtained:  Additional history obtained from epic chart review   Lab Tests:  I Ordered, and personally interpreted labs.  The pertinent results include: Wet prep with clue cells and WBCs.  GC/chlamydia pending.  No other acute laboratory findings   Imaging Studies ordered:  I ordered imaging studies including pelvic US I independently visualized and interpreted imaging which showed  No evidence of ovarian torsion. 3.3 x 1.6 cm complex cystic abnormality  is seen adjacent to or arising from the right ovary. Potentially this may represent hemorrhagic cyst. Short-interval follow up ultrasound in 6-12 weeks is recommended, preferably during the week following the patient's normal menses. I agree with the radiologist interpretation   Problem List / ED Course / Critical interventions / Medication management  I ordered medication including Rocephin for STD Reevaluation of the patient after these medicines showed that the patient stayed the same I have reviewed the patients home medicines and have made adjustments as needed    Test / Admission - Considered:  Pt presents with inguinal lymphadenopathy.  Wet prep positive for bacterial vaginosis.  She also has greater than 10 WBCs with CMT present.  Given this, will treat for BV with Flagyl and cover for GC/chlamydia as well with IM Rocephin and doxycycline.  Pt understands that they have GC/Chlamydia cultures pending and that they will need to inform all sexual partners if results return positive. Pt has been treated prophylactically with doxycycline and Rocephin due to pts history, pelvic exam, and wet prep with increased WBCs.  Some concern for PID given her CMT, however she is hemodynamically stable in a low risk age group with no comorbid  conditions, therefore patient will be discharged with outpatient antibiotics and close OB/GYN follow-up.  No concern for Lynnae January at this time. pt has been advised to not drink alcohol while taking Flagyl.  Patient to be discharged with instructions to follow up with OBGYN/PCP. Discussed importance of using protection when sexually active.  Patient is understanding and amenable with plan, and recollect symptoms of prompt immediate return.  Discharged in stable condition.   Final Clinical Impression(s) / ED Diagnoses Final diagnoses:  Bacterial vaginosis  Inguinal lymphadenopathy  Concern about STD in female without diagnosis    Rx / DC Orders ED Discharge Orders          Ordered    metroNIDAZOLE (FLAGYL) 500 MG tablet  2 times daily        10/01/21 1701    doxycycline (VIBRAMYCIN) 100 MG capsule  2 times daily        10/01/21 1701          An After Visit Summary was printed and given to the patient.     Vear Clock 10/01/21 1703    Alvira Monday, MD 10/02/21 [phone]

## 2021-10-01 NOTE — ED Triage Notes (Signed)
C/O lower abdominal pain along w/ "feeling a knot" on bilateral groin and umbilical area; x 3 days;

## 2021-10-02 LAB — GC/CHLAMYDIA PROBE AMP (~~LOC~~) NOT AT ARMC
Chlamydia: NEGATIVE
Comment: NEGATIVE
Comment: NORMAL
Neisseria Gonorrhea: NEGATIVE

## 2021-10-04 ENCOUNTER — Telehealth (HOSPITAL_COMMUNITY): Payer: Self-pay

## 2022-01-27 IMAGING — US US ABDOMEN COMPLETE
1 series · 15 of 25 positions shown · non-contrast
Comparison: CT abdomen and pelvis 07/01/2018

CLINICAL DATA: Upper abdominal pain, 13 weeks 2 days pregnant

EXAM:
ABDOMEN ULTRASOUND COMPLETE

[Series 1: us abdomen complete · 15 of 88 slices shown]
[im 1/88]
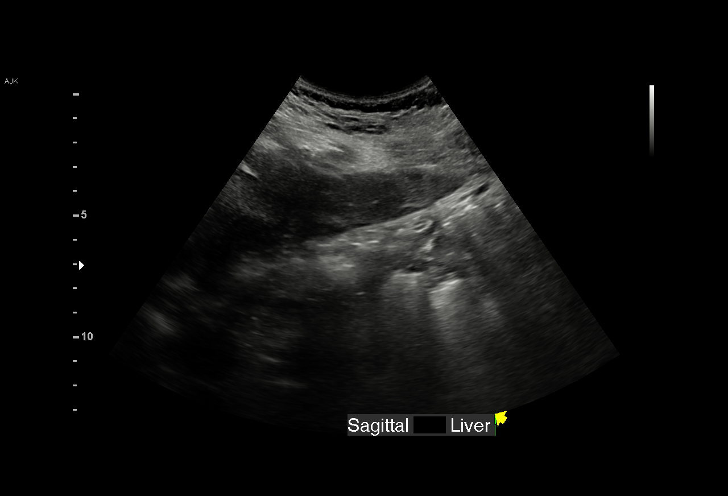
[im 8/88]
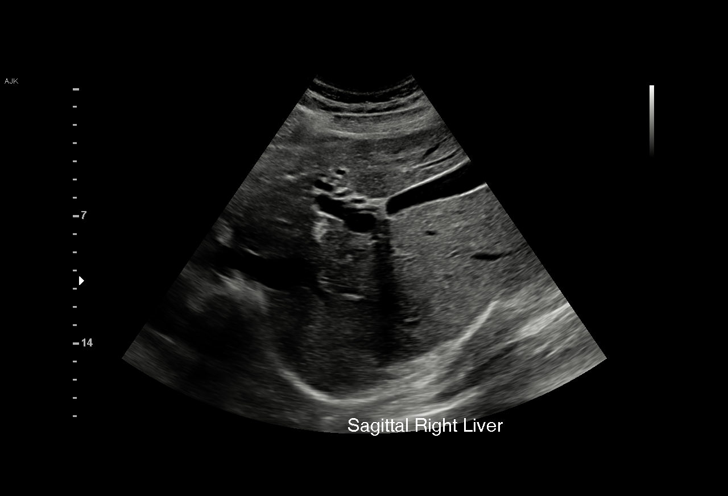
[im 15/88]
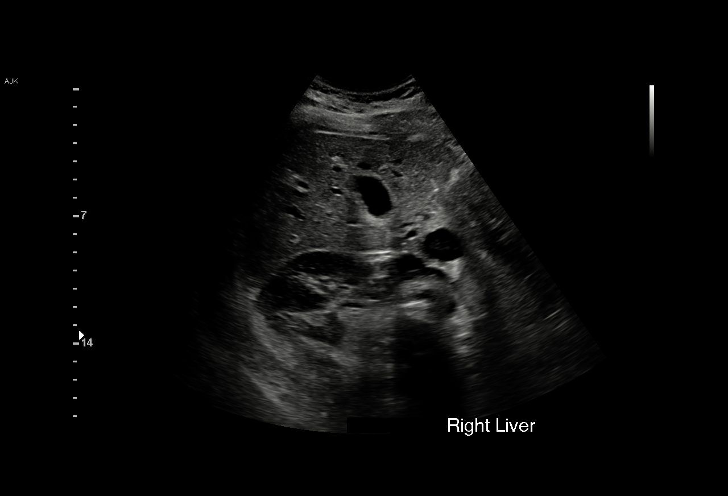
[im 19/88]
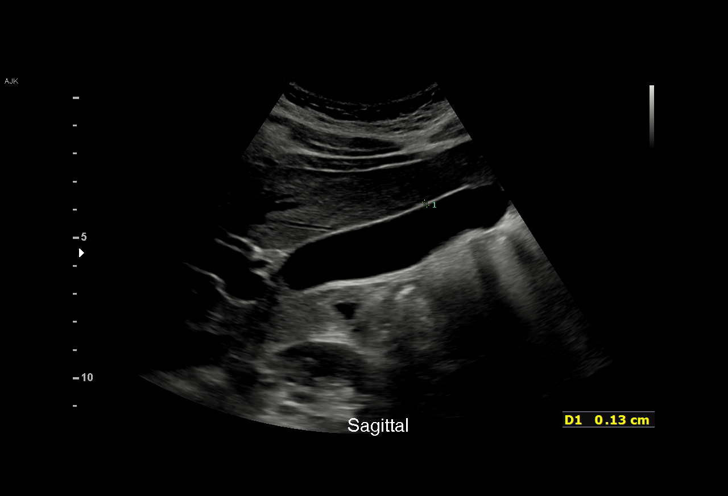
[im 26/88]
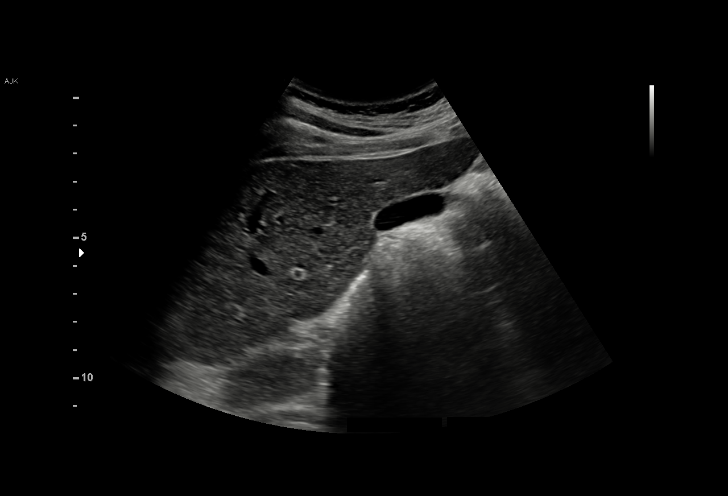
[im 33/88]
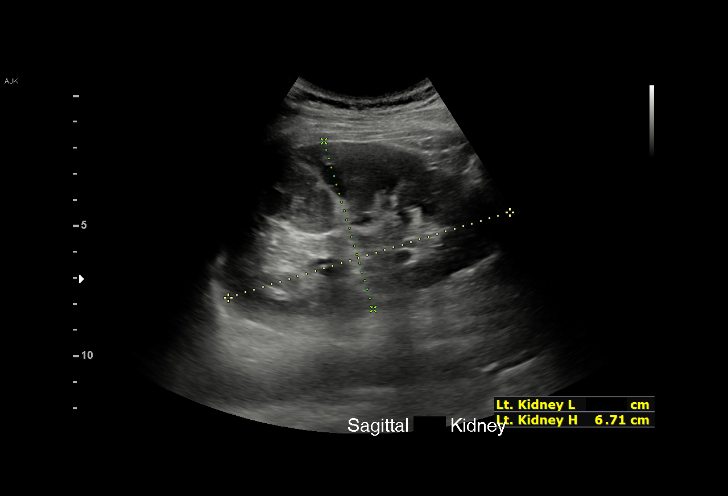
[im 37/88]
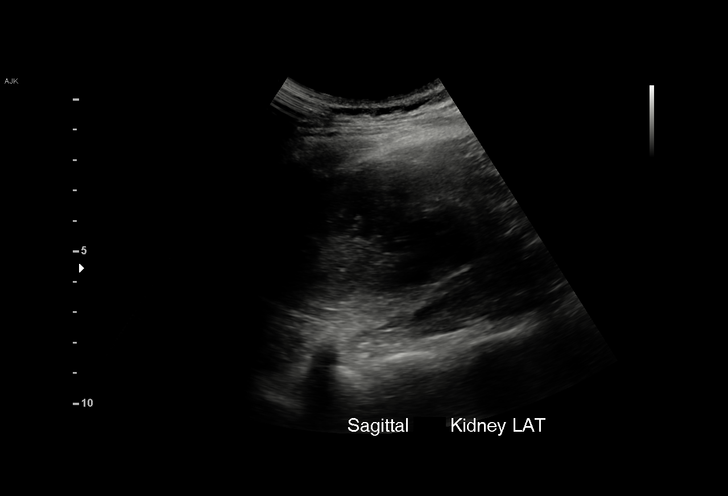
[im 44/88]
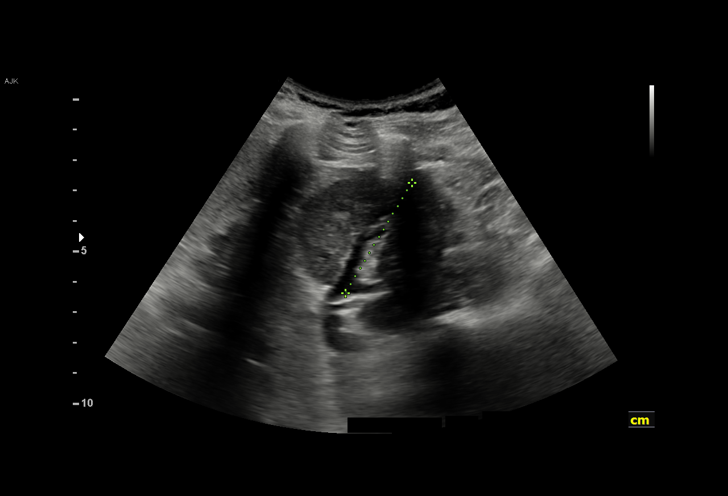
[im 51/88]
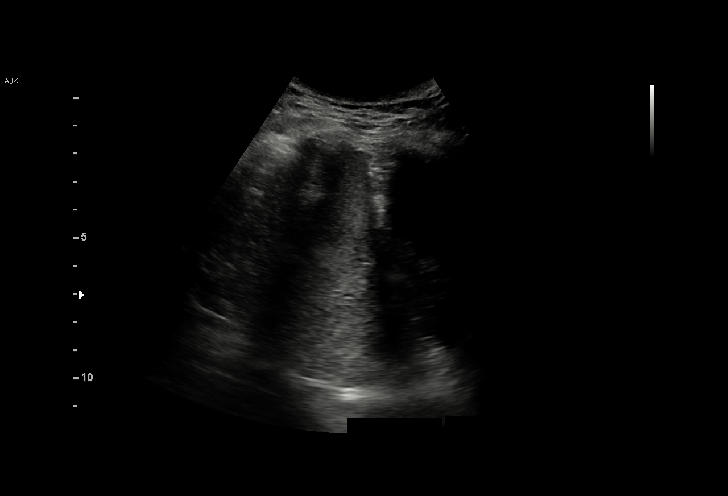
[im 55/88]
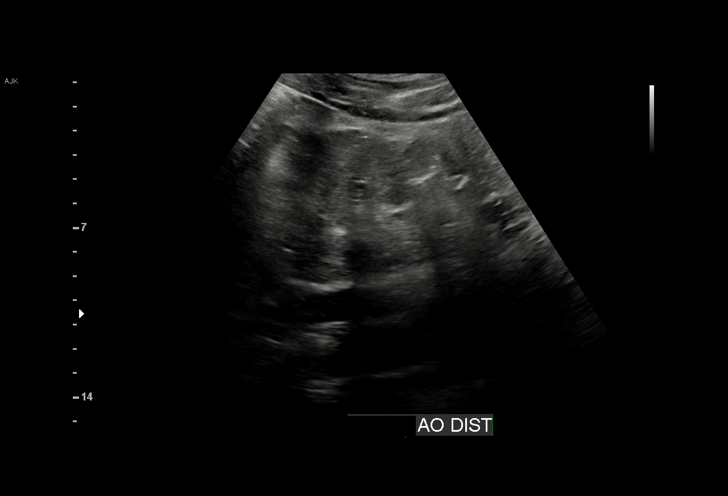
[im 62/88]
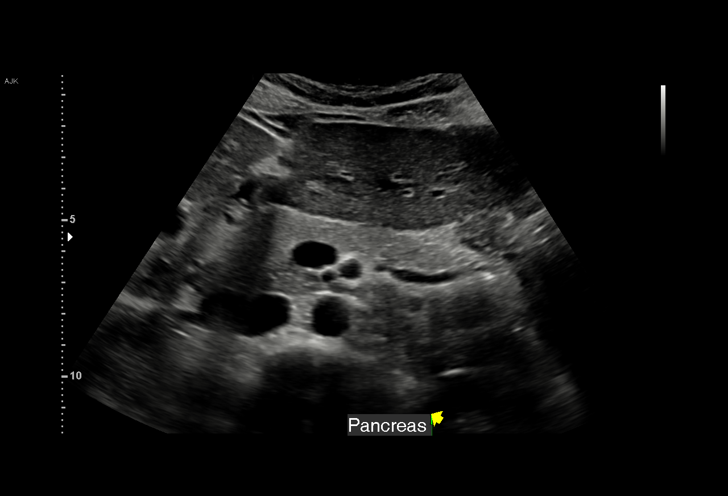
[im 69/88]
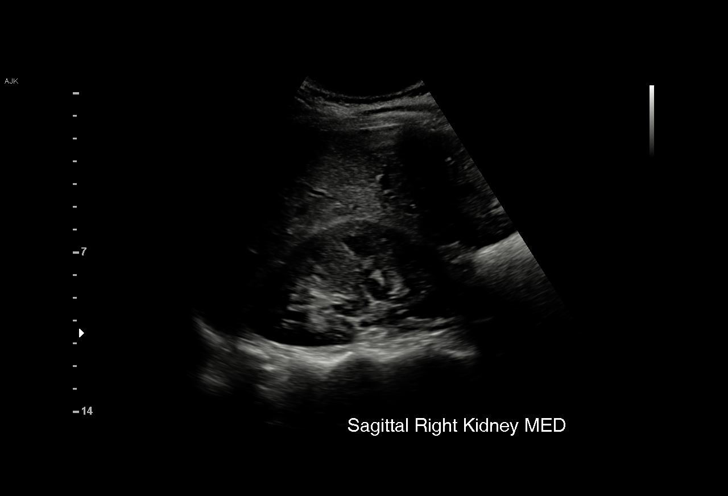
[im 73/88]
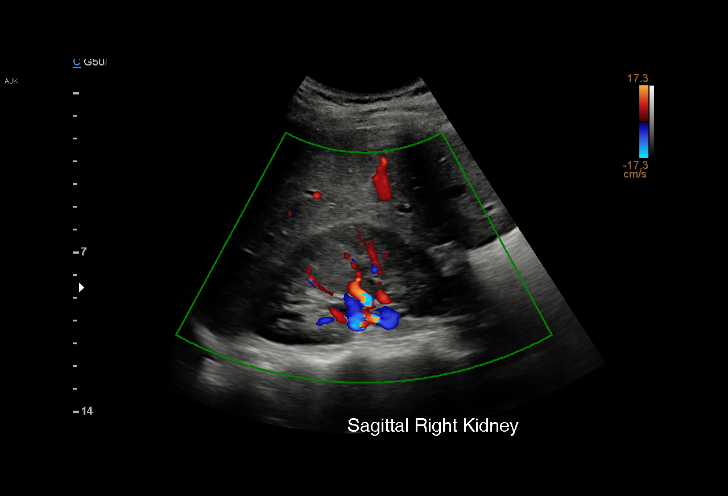
[im 80/88]
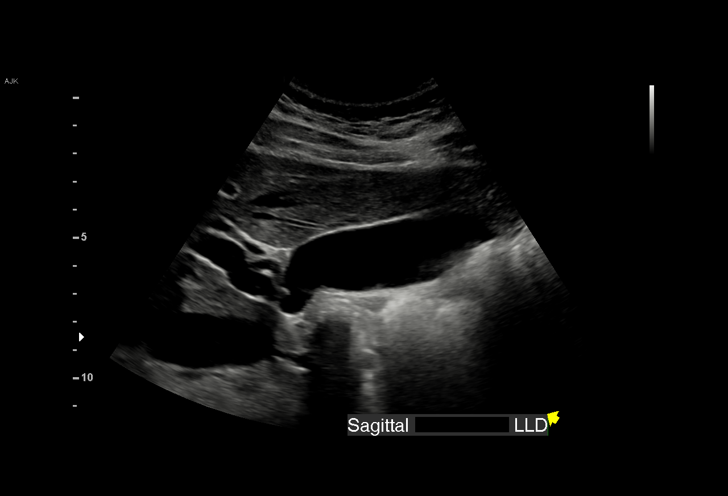
[im 88/88]
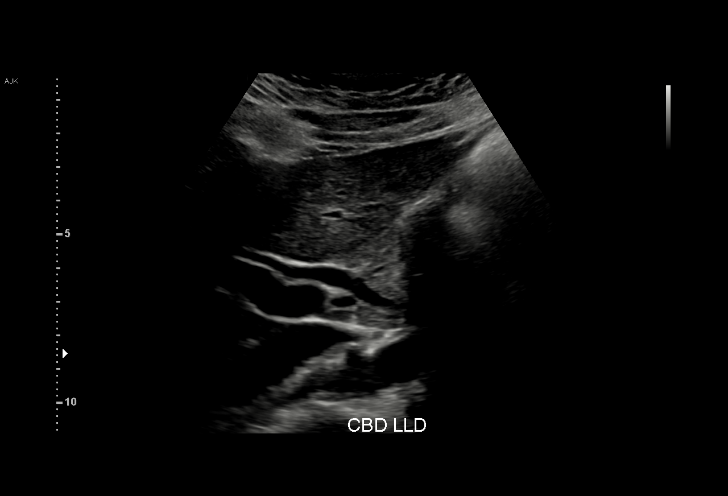

[15 of 25 positions shown; findings below may reference images not displayed]

FINDINGS: Gallbladder: Normally distended without stones or wall thickening.
No pericholecystic fluid or sonographic Murphy sign.

Common bile duct: Diameter: 4 mm, normal

Liver: Normal echogenicity without mass or nodularity. Portal vein
is patent on color Doppler imaging with normal direction of blood
flow towards the liver.

IVC: Normal appearance

Pancreas: Normal appearance

Spleen: Normal appearance, 6.6 cm length

Right Kidney: Length: 10.3 cm. Normal morphology without mass or
hydronephrosis.

Left Kidney: Length: 11.3 cm. Normal morphology without mass or
hydronephrosis.

Abdominal aorta: Normal caliber

Other findings: No free fluid
IMPRESSION: Normal exam.

## 2022-03-24 IMAGING — US US MFM OB LIMITED
1 series · 15 of 28 positions shown · non-contrast
Comparison: none

[Series 1: us mfm ob limited · 15 of 29 slices shown]
[im 1/29]
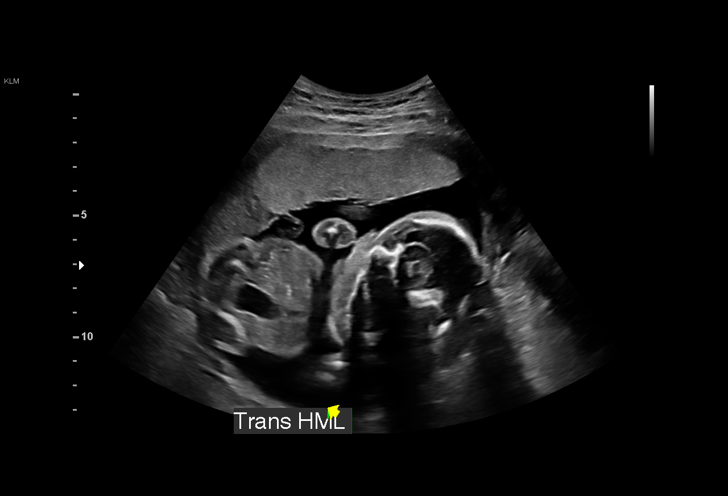
[im 3/29]
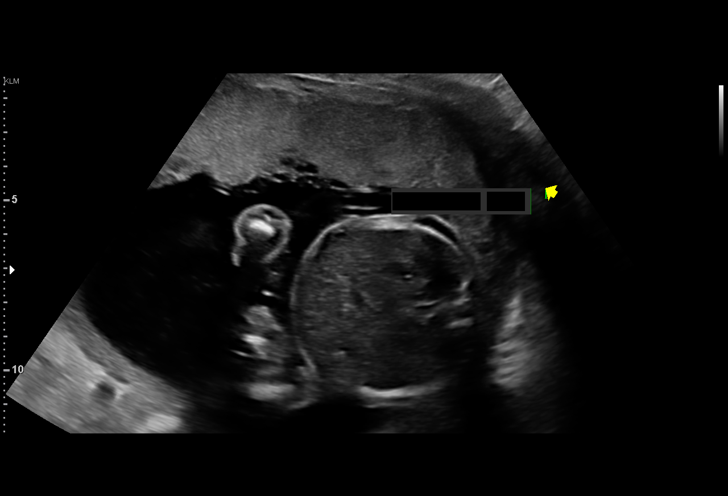
[im 5/29]
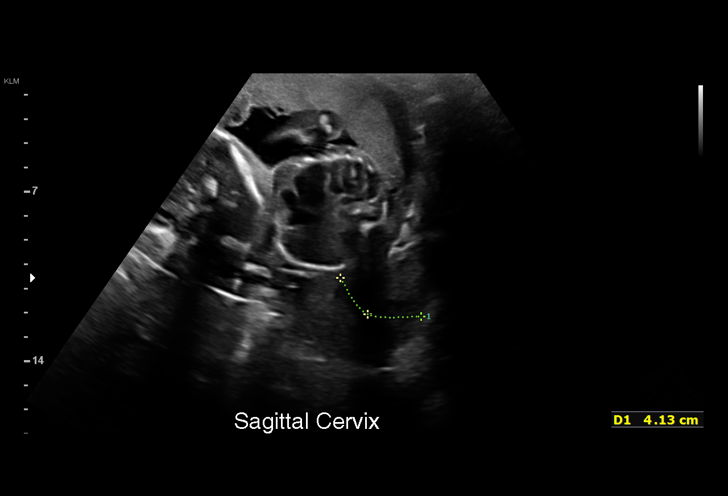
[im 7/29]
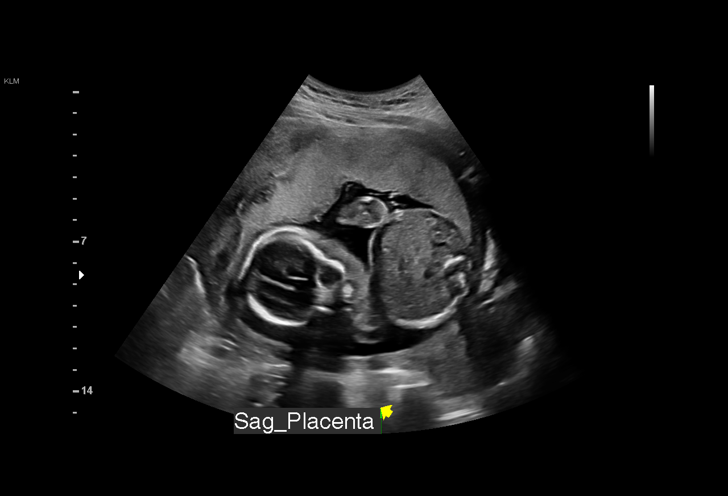
[im 9/29]
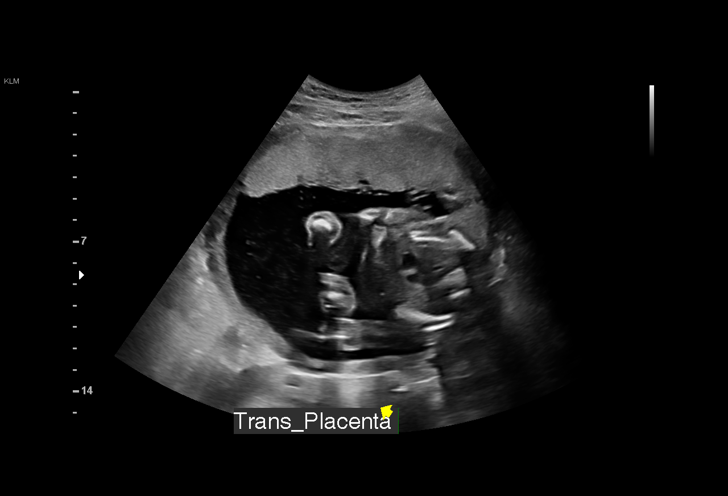
[im 11/29]
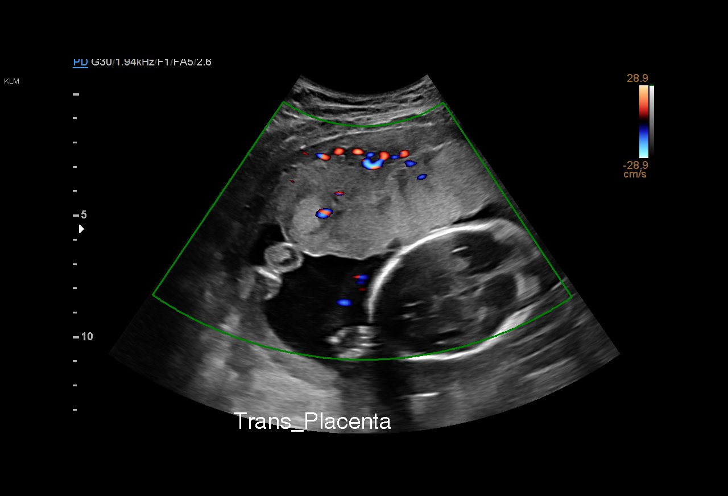
[im 13/29]
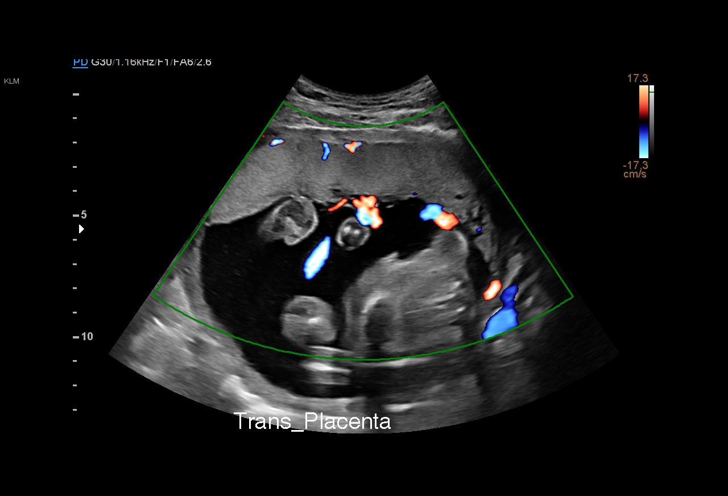
[im 15/29]
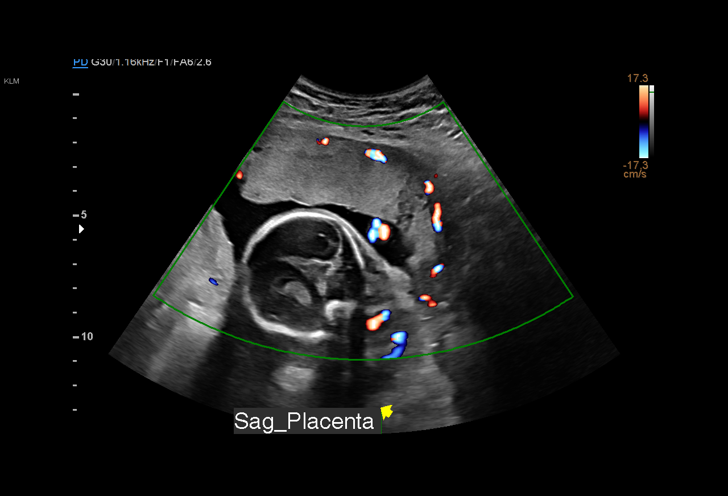
[im 16/29]
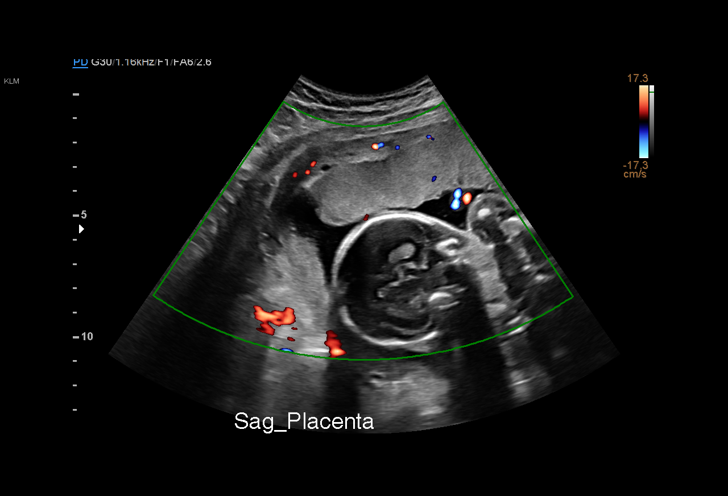
[im 18/29]
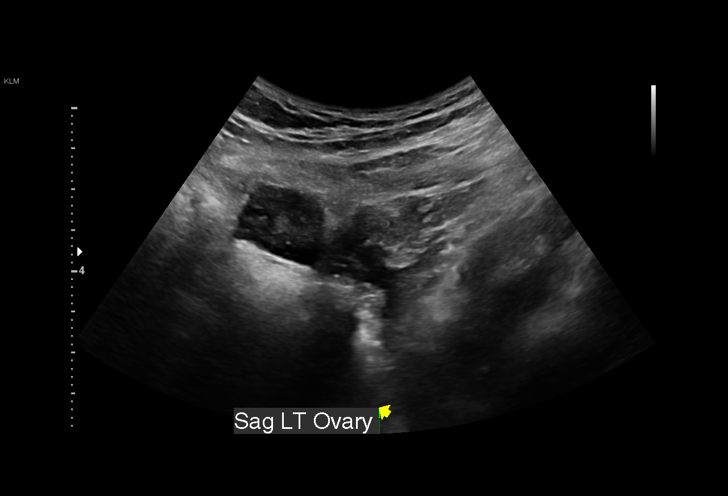
[im 20/29]
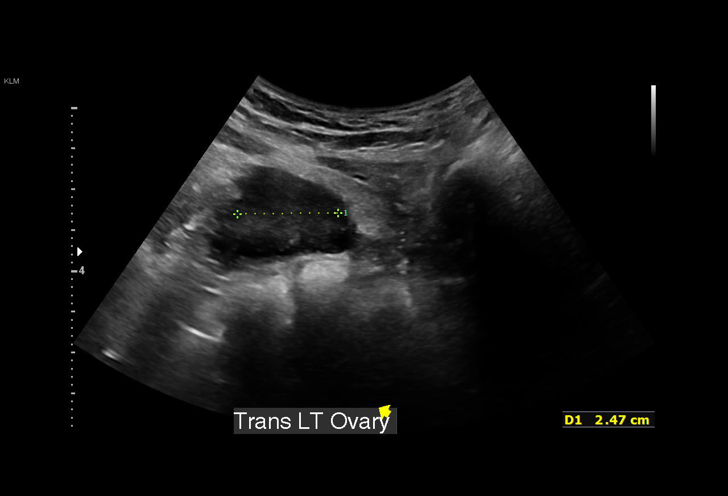
[im 22/29]
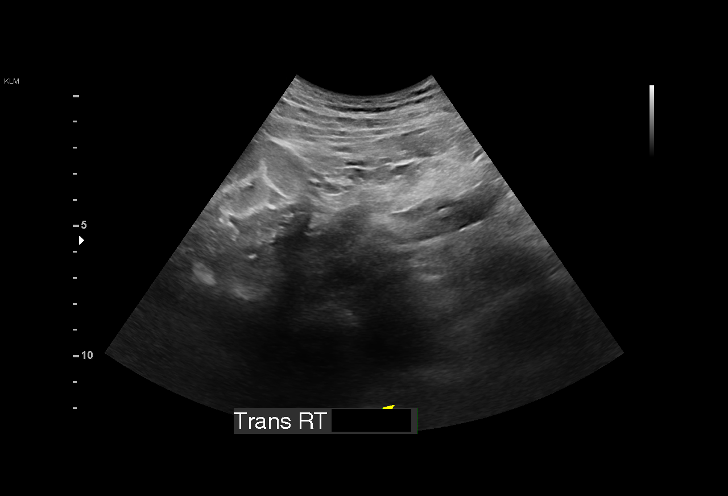
[im 24/29]
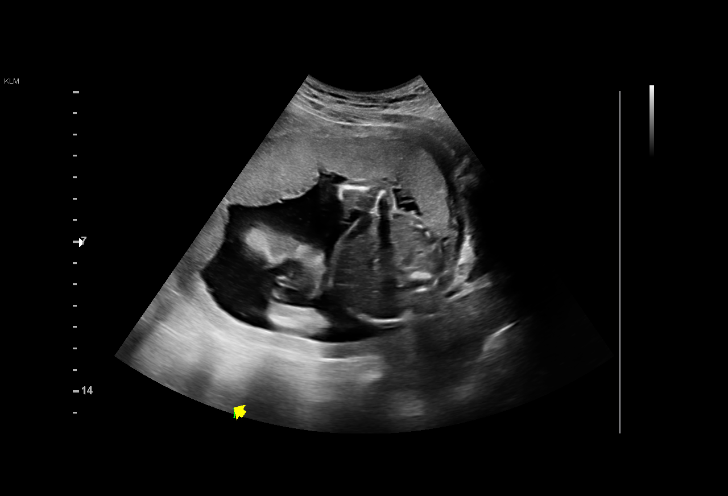
[im 26/29]
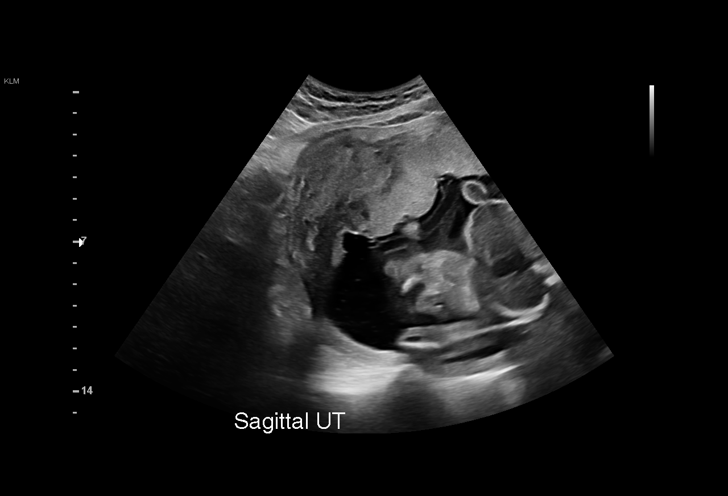
[im 29/29]
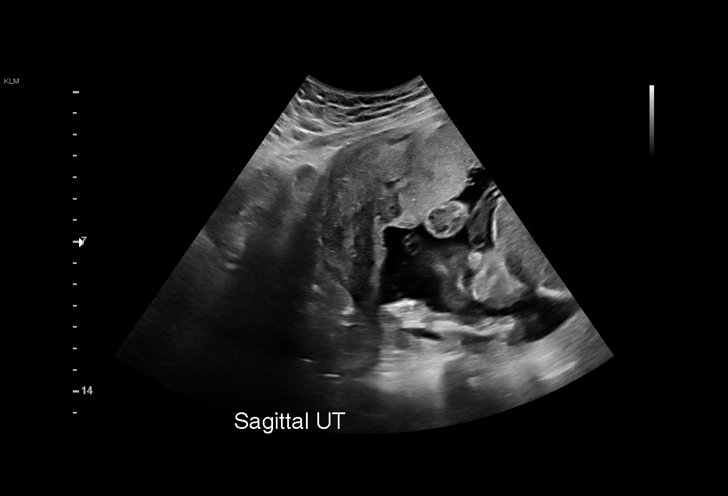

[15 of 28 positions shown; findings below may reference images not displayed]

[REDACTED]care
                   WALKER CNM

 1  US MFM OB LIMITED                     76815.01    TOD SKEETE

Indications

 Pelvic pain affecting pregnancy in second
 trimester
 21 weeks gestation of pregnancy
Fetal Evaluation

 Num Of Fetuses:         1
 Fetal Heart Rate(bpm):  135
 Cardiac Activity:       Observed
 Presentation:           Transverse, head to maternal left
 Placenta:               Anterior
 P. Cord Insertion:      Visualized

 Amniotic Fluid
 AFI FV:      Within normal limits

                             Largest Pocket(cm)


 Comment:    No placental abruption or previa identified.
OB History

 Gravidity:    2
 Living:       1
Gestational Age

 LMP:           22w 2d        Date:  01/11/20                 EDD:   10/17/20
 Best:          21w 2d     Det. By:  Early Ultrasound         EDD:   10/24/20
                                     (03/16/20)
Anatomy

 Stomach:               Appears normal, left   Bladder:                Appears normal
                        sided
Cervix Uterus Adnexa

 Cervix
 Length:            4.2  cm.
 Normal appearance by transabdominal scan.

 Uterus
 No abnormality visualized.

 Right Ovary
 No adnexal mass visualized.

 Left Ovary
 Within normal limits.

 Adnexa
 No abnormality visualized.
Impression

 Limited exam to assess the maternal pelvic pain
 Normal amniotic fluid and fetal movement seen
 No evidence of placental abruption or previa
 Normal cervical length.
Recommendations

 Clinical correlation recommended.

## 2022-03-24 IMAGING — US US ABDOMEN COMPLETE
1 series · 15 of 25 positions shown · non-contrast
Comparison: April 20, 2020

CLINICAL DATA: Abdominal pain

EXAM:
ABDOMEN ULTRASOUND COMPLETE

[Series 1: us abdomen complete · 15 of 72 slices shown]
[im 1/72]
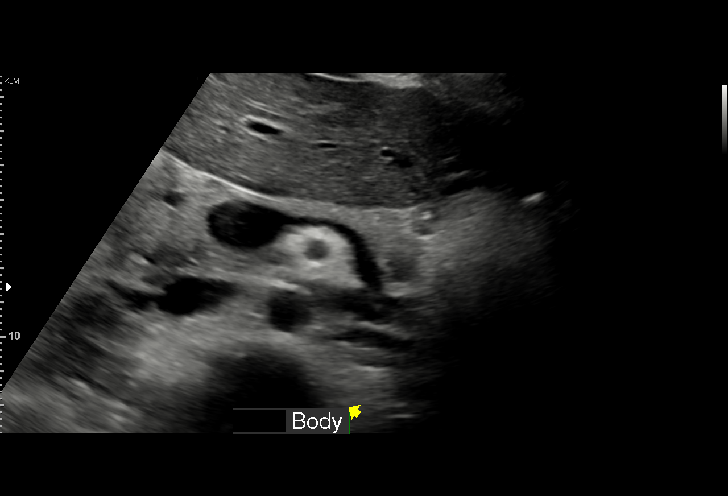
[im 6/72]
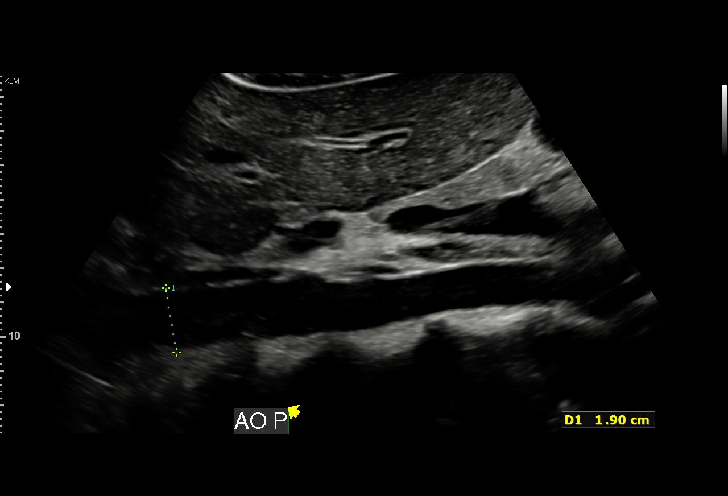
[im 12/72]
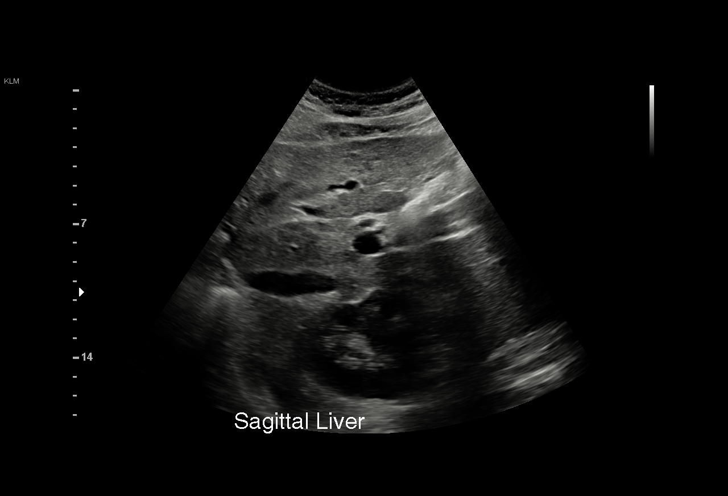
[im 15/72]
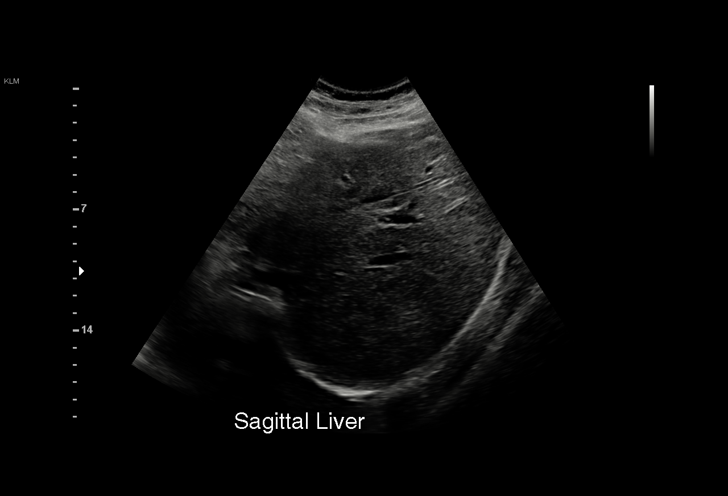
[im 21/72]
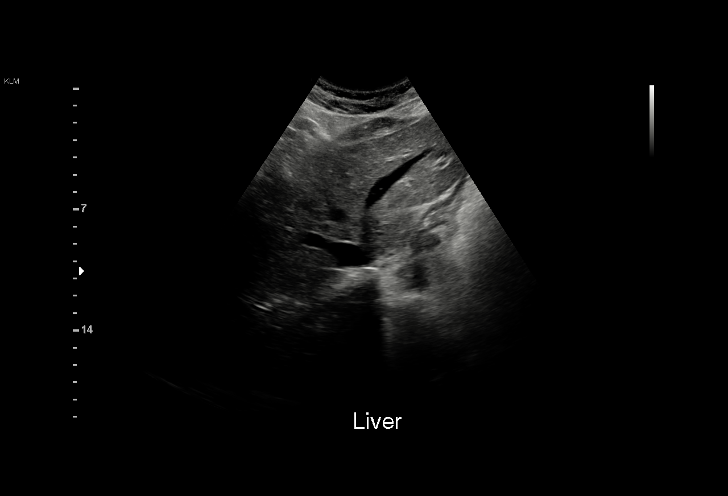
[im 27/72]
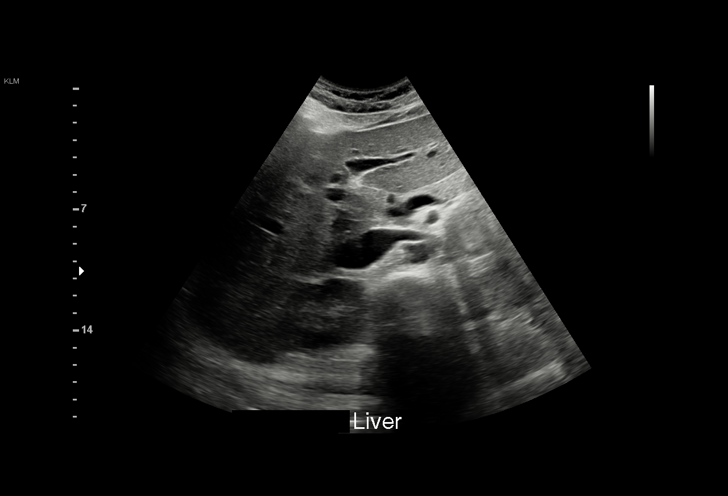
[im 30/72]
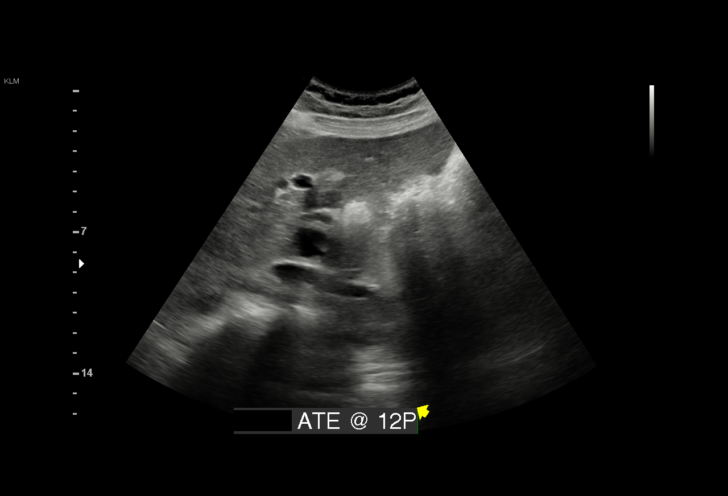
[im 36/72]
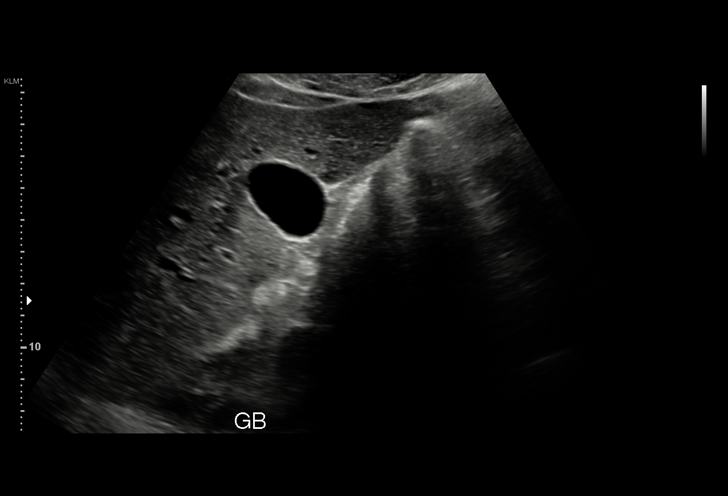
[im 42/72]
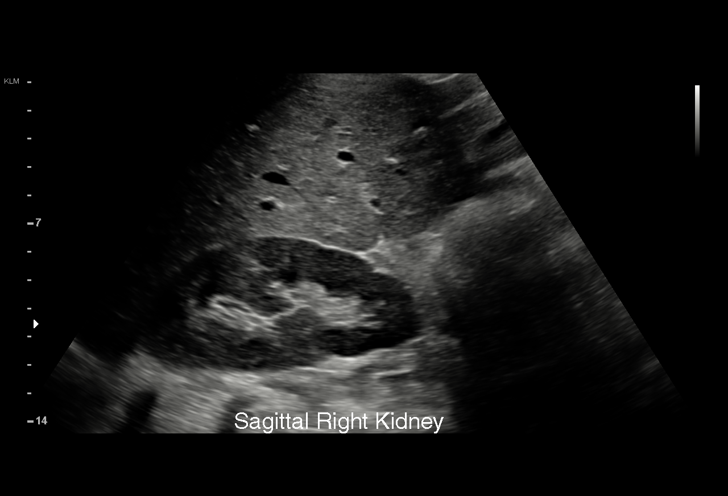
[im 45/72]
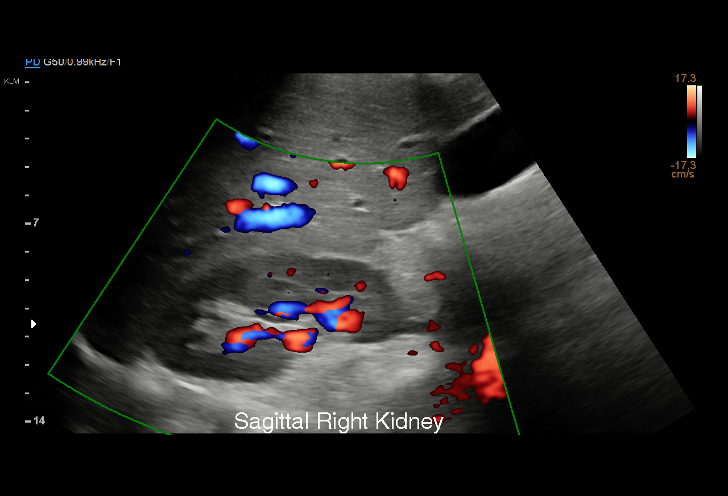
[im 51/72]
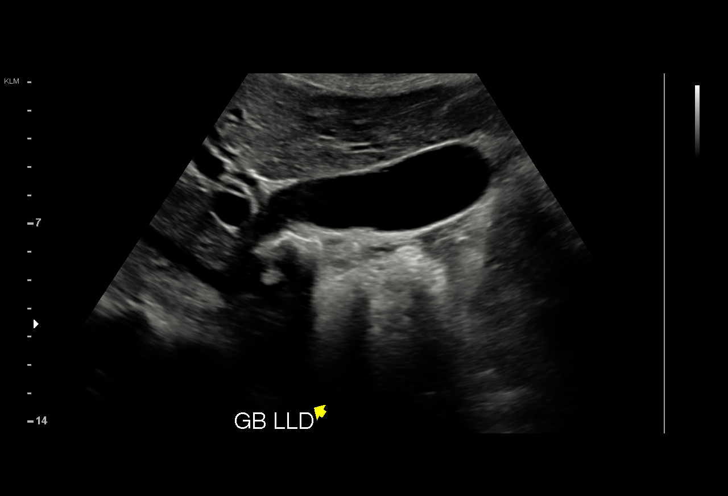
[im 57/72]
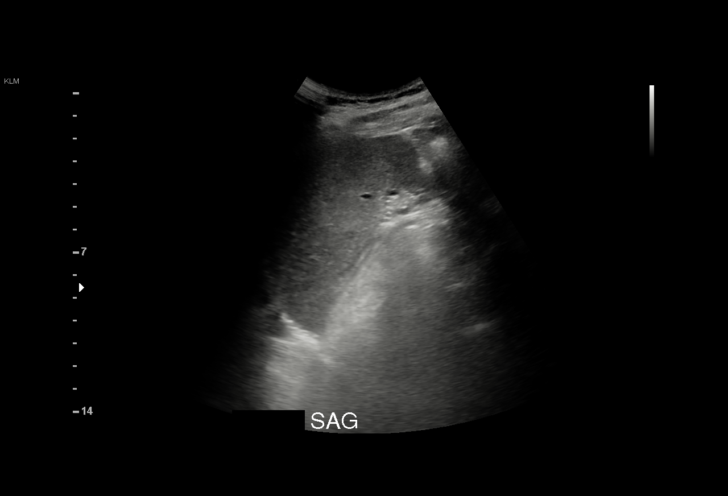
[im 60/72]
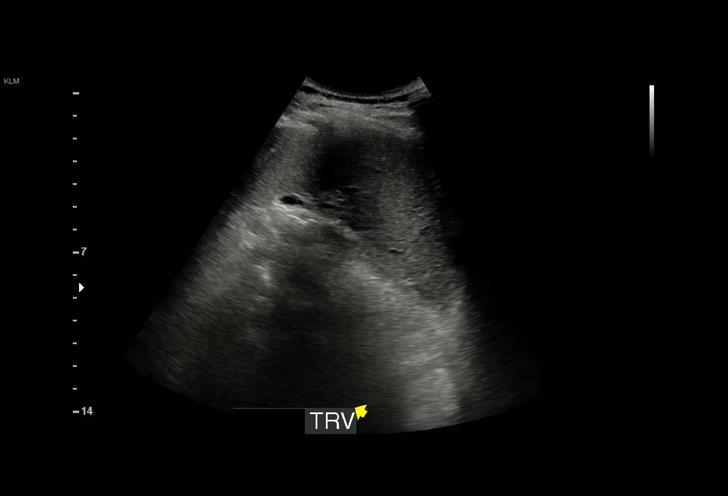
[im 66/72]
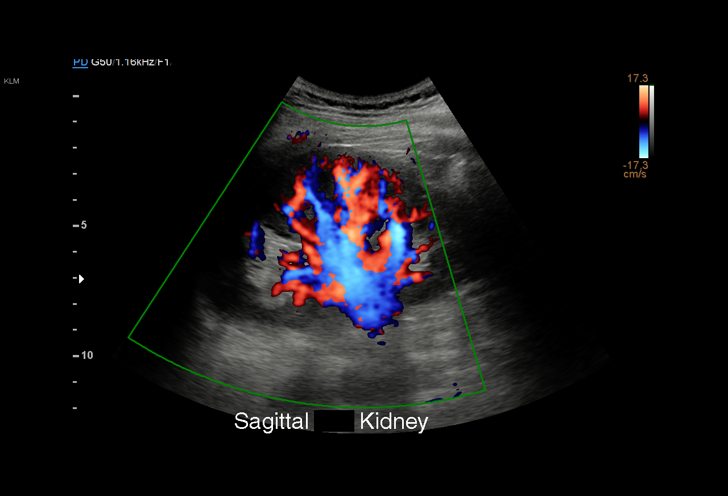
[im 72/72]
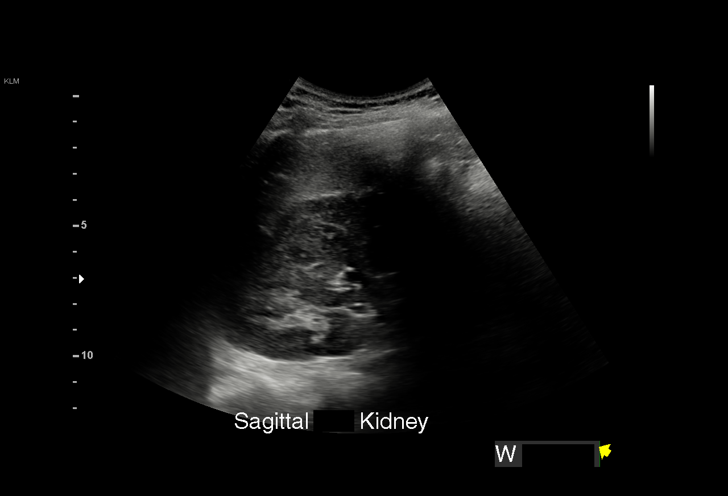

[15 of 25 positions shown; findings below may reference images not displayed]

FINDINGS: Gallbladder: No gallstones or wall thickening visualized. There is
no pericholecystic fluid. No sonographic Murphy sign noted by
sonographer.

Common bile duct: Diameter: 3 mm. No intrahepatic, common hepatic,
or common bile duct dilatation.

Liver: No focal lesion identified. Within normal limits in
parenchymal echogenicity. Portal vein is patent on color Doppler
imaging with normal direction of blood flow towards the liver.

IVC: No abnormality visualized.

Pancreas: No pancreatic mass or inflammatory focus.

Spleen: Size and appearance within normal limits.

Right Kidney: Length: 10.5 cm. Echogenicity within normal limits. No
mass or hydronephrosis visualized.

Left Kidney: Length: 12.4 cm. Echogenicity within normal limits. No
mass or hydronephrosis visualized.

Abdominal aorta: No aneurysm visualized.

Other findings: No evident ascites.
IMPRESSION: Study within normal limits.

## 2022-07-28 ENCOUNTER — Other Ambulatory Visit (HOSPITAL_COMMUNITY)
Admission: RE | Admit: 2022-07-28 | Discharge: 2022-07-28 | Disposition: A | Discharge status: Home / Self Care | Payer: Medicaid Other | Source: Ambulatory Visit | Attending: Family Medicine | Admitting: Family Medicine

## 2022-07-28 ENCOUNTER — Ambulatory Visit (INDEPENDENT_AMBULATORY_CARE_PROVIDER_SITE_OTHER): Payer: Medicaid Other | Admitting: *Deleted

## 2022-07-28 ENCOUNTER — Other Ambulatory Visit: Payer: Self-pay

## 2022-07-28 VITALS — BP 125/89 | HR 62 | Ht 62.0 in | Wt 152.0 lb

## 2022-07-28 DIAGNOSIS — N898 Other specified noninflammatory disorders of vagina: Secondary | ICD-10-CM

## 2022-07-28 DIAGNOSIS — Z711 Person with feared health complaint in whom no diagnosis is made: Secondary | ICD-10-CM | POA: Insufficient documentation

## 2022-07-28 NOTE — Progress Notes (Signed)
Pt presents for STI testing due to her partner informed her that he is being treated for a "bacterial infection". She began having vaginal odor 2 days ago and now wants to rule out any type of infection. Self swab obtained. Pt advised that she will receive test results and treatment information (if any needed) via Mychart. She voiced understanding.

## 2022-07-29 LAB — CERVICOVAGINAL ANCILLARY ONLY
Bacterial Vaginitis (gardnerella): POSITIVE — AB
Candida Glabrata: NEGATIVE
Candida Vaginitis: NEGATIVE
Chlamydia: NEGATIVE
Comment: NEGATIVE
Comment: NEGATIVE
Comment: NEGATIVE
Comment: NEGATIVE
Comment: NEGATIVE
Comment: NORMAL
Neisseria Gonorrhea: NEGATIVE
Trichomonas: NEGATIVE

## 2022-07-30 MED ORDER — METRONIDAZOLE 500 MG PO TABS
500.0000 mg | ORAL_TABLET | Freq: Two times a day (BID) | ORAL | 0 refills | Status: AC
Start: 2022-07-30 — End: 2022-08-06

## 2022-07-30 NOTE — Addendum Note (Signed)
Addended by: Reva Bores on: 07/30/2022 12:16 AM   Modules accepted: Orders

## 2022-09-25 ENCOUNTER — Ambulatory Visit (INDEPENDENT_AMBULATORY_CARE_PROVIDER_SITE_OTHER): Payer: Self-pay

## 2022-09-25 DIAGNOSIS — Z3201 Encounter for pregnancy test, result positive: Secondary | ICD-10-CM

## 2022-09-25 LAB — POCT PREGNANCY, URINE: Preg Test, Ur: POSITIVE — AB

## 2022-09-25 NOTE — Patient Instructions (Addendum)
[facility] Prenatal Care Providers          [facility] locations:  Hours may vary. Please call for an appointment  [facility] at [facility]             [address], [place], [place] [address] [phone]  [facility] at [facility]                                                             [address], [address], [place], [place], [address] [phone]  [facility] at [facility]                                    [address], [address], [place], [place], [address] [phone]  [facility] at [place] [address], [address], [place], [place], [address] [phone]  [facility] at [facility]                                 [address], [place], [place], [address] [phone]  [facility] at [facility]                                    [address], [place], [place], [address] [phone]  [facility] at [facility] [address], [address], [place], [place], [address]                                                   Safe Medications in Pregnancy    Acne: Benzoyl Peroxide Salicylic Acid  Backache/Headache: Tylenol: 2 regular strength every 4 hours OR              2 Extra strength every 6 hours  Colds/Coughs/Allergies: Benadryl (alcohol free) 25 mg every 6 hours as needed Breath right strips Claritin Cepacol throat lozenges Chloraseptic throat spray Cold-Eeze- up to three times per day Cough drops, alcohol free Flonase (by prescription only) Guaifenesin Mucinex Robitussin DM (plain only, alcohol free) Saline nasal spray/drops Sudafed (pseudoephedrine) & Actifed ** use only after [redacted] weeks gestation and if you do not have high blood pressure Tylenol Vicks Vaporub Zinc lozenges Zyrtec   Constipation: Colace Ducolax suppositories Fleet enema Glycerin  suppositories Metamucil Milk of magnesia Miralax Senokot Smooth move tea  Diarrhea: Kaopectate Imodium A-D  *NO pepto Bismol  Hemorrhoids: Anusol Anusol HC Preparation H Tucks  Indigestion: Tums Maalox Mylanta Zantac  Pepcid  Insomnia: Benadryl (alcohol free) 25mg every 6 hours as needed Tylenol PM Unisom, no Gelcaps  Leg Cramps: Tums MagGel  Nausea/Vomiting:  Bonine Dramamine Emetrol Ginger extract Sea bands Meclizine  Nausea medication to take during pregnancy:  Unisom (doxylamine succinate 25 mg tablets) Take one tablet daily at bedtime. If symptoms are not adequately controlled, the dose can be increased to a maximum recommended dose of two   tablets daily (1/2 tablet in the morning, 1/2 tablet mid-afternoon and one at bedtime). Vitamin B6 100mg tablets. Take one tablet twice a day (up to 200 mg per day).  Skin Rashes: Aveeno products Benadryl cream or 25mg every 6 hours as needed Calamine Lotion 1% cortisone cream  Yeast infection: Gyne-lotrimin 7 Monistat 7   **If taking multiple medications, please check labels to avoid duplicating the same active ingredients **take medication as directed on the label ** Do not exceed 4000 mg of tylenol in 24 hours **Do not take medications that contain aspirin or ibuprofen    

## 2022-09-25 NOTE — Progress Notes (Signed)
Pt left urine for pregnancy test resulting positive.  Pt reports LMP 08/18/22, EDD 05/25/23 and 5w 3d.   Medications and allergies reviewed.  Pt denies VB and pain.  Pt provided with list of medication safe to take in medication and list of [facility] offices to schedule prenatal appt.  Pt advised to start taking PNV and when to go to MAU for evaluation.  Pt verbalized understanding.   Addison Naegeli, RN 09/25/22

## 2022-09-30 ENCOUNTER — Telehealth: Payer: Self-pay | Admitting: Family Medicine

## 2022-09-30 NOTE — Telephone Encounter (Signed)
Patient said she is undecided about keeping her pregnancy, want to see if she can get an Ultra Sound to see if it's one or two fetus

## 2022-10-01 ENCOUNTER — Inpatient Hospital Stay (HOSPITAL_COMMUNITY): Payer: Self-pay

## 2022-10-01 ENCOUNTER — Other Ambulatory Visit: Payer: Self-pay

## 2022-10-01 ENCOUNTER — Inpatient Hospital Stay (HOSPITAL_COMMUNITY)
Admission: AD | Admit: 2022-10-01 | Discharge: 2022-10-01 | Disposition: A | Discharge status: Home / Self Care | Payer: Self-pay | Attending: Obstetrics and Gynecology | Admitting: Obstetrics and Gynecology

## 2022-10-01 ENCOUNTER — Encounter (HOSPITAL_COMMUNITY): Payer: Self-pay | Admitting: Obstetrics and Gynecology

## 2022-10-01 DIAGNOSIS — Z3A01 Less than 8 weeks gestation of pregnancy: Secondary | ICD-10-CM | POA: Insufficient documentation

## 2022-10-01 DIAGNOSIS — R1030 Lower abdominal pain, unspecified: Secondary | ICD-10-CM | POA: Insufficient documentation

## 2022-10-01 DIAGNOSIS — O26891 Other specified pregnancy related conditions, first trimester: Secondary | ICD-10-CM

## 2022-10-01 DIAGNOSIS — R109 Unspecified abdominal pain: Secondary | ICD-10-CM

## 2022-10-01 DIAGNOSIS — O161 Unspecified maternal hypertension, first trimester: Secondary | ICD-10-CM | POA: Insufficient documentation

## 2022-10-01 DIAGNOSIS — Z3490 Encounter for supervision of normal pregnancy, unspecified, unspecified trimester: Secondary | ICD-10-CM

## 2022-10-01 DIAGNOSIS — R252 Cramp and spasm: Secondary | ICD-10-CM | POA: Insufficient documentation

## 2022-10-01 LAB — WET PREP, GENITAL
Clue Cells Wet Prep HPF POC: NONE SEEN
Sperm: NONE SEEN
Trich, Wet Prep: NONE SEEN
WBC, Wet Prep HPF POC: >=10 — AB (ref <10)
Yeast Wet Prep HPF POC: NONE SEEN

## 2022-10-01 LAB — URINALYSIS, ROUTINE W REFLEX MICROSCOPIC
Bilirubin Urine: NEGATIVE
Glucose, UA: NEGATIVE mg/dL
Hgb urine dipstick: NEGATIVE
Ketones, ur: NEGATIVE mg/dL
Nitrite: NEGATIVE
Protein, ur: NEGATIVE mg/dL
Specific Gravity, Urine: 1.016 (ref 1.005–1.030)
pH: 6 (ref 5.0–8.0)

## 2022-10-01 LAB — CBC
HCT: 33.9 % — ABNORMAL LOW (ref 36.0–46.0)
Hemoglobin: 11.1 g/dL — ABNORMAL LOW (ref 12.0–15.0)
MCH: 27.1 pg (ref 26.0–34.0)
MCHC: 32.7 g/dL (ref 30.0–36.0)
MCV: 82.7 fL (ref 80.0–100.0)
Platelets: 343 10*3/uL (ref 150–400)
RBC: 4.1 MIL/uL (ref 3.87–5.11)
RDW: 15.7 % — ABNORMAL HIGH (ref 11.5–15.5)
WBC: 4.5 10*3/uL (ref 4.0–10.5)
nRBC: 0 % (ref 0.0–0.2)

## 2022-10-01 LAB — HCG, QUANTITATIVE, PREGNANCY: hCG, Beta Chain, Quant, S: 6225 m[IU]/mL — ABNORMAL HIGH (ref <5)

## 2022-10-01 LAB — ABO/RH: ABO/RH(D): B POS

## 2022-10-01 MED ORDER — PRENATAL 27-0.8 MG PO TABS: 1.0000 | ORAL_TABLET | Freq: Every day | ORAL | 0 refills | Status: DC

## 2022-10-01 NOTE — Discharge Instructions (Signed)
 [facility] [place], [place] Address: [address], [place], [place] [address] Phone: [phone] Hours:  Sunday:  Closed Monday:  8:00 am -- 5:00 pm Tuesday: 8:00 am -- 5:00 pm Wednesday:  8:00 am -- 5:00 pm Thursday:  8:00 am -- 5:00 pm Friday:  8:00 am -- 4:00 pm Saturday:  8:00 am -- 12:00 pm  [facility] Address: [address], [place], [place] [address] Phone: [phone] Hours:  Monday 9AM-5PM Tuesday 10AM-6PM Wednesday 11AM-7PM Thursday 9AM-5PM Friday              8AM-2PM Saturday Sunday

## 2022-10-01 NOTE — MAU Provider Note (Signed)
History     CSN: [id number]  Arrival date and time: 10/01/22 1557   None     Chief Complaint  Patient presents with   Abdominal Pain   HPI Yui LEIGHLANI BOWLEN is a 32 y.o. [id number] at [redacted]w[redacted]d who presents today for abdominal cramping. She was reports ongoing intermittent cramping which started yesterday. She feels cramping is maybe worse on right side versus her left side. She denies vaginal bleeding, abnormal vaginal discharge. No fevers or chills. Having some nausea but able to tolerate PO.  Past Medical History:  Diagnosis Date   Anemia    Anxiety    Asthma    albuterol prn   Headache    migraines   Hx of trichomoniasis    Hx of varicella    Neck injury, subsequent encounter 07/26/2016   Placental abruption in third trimester 09/04/2014   Subchorionic hemorrhage in third trimester 07/26/2014   Velamentous insertion of umbilical cord     Past Surgical History:  Procedure Laterality Date   TONSILLECTOMY      Family History  Problem Relation Age of Onset   Hypertension Mother    Hypertension Maternal Grandmother    Heart attack Maternal Grandmother    Hypertension Maternal Grandfather    Cancer Maternal Grandfather    Diabetes Paternal Grandmother     Social History   Tobacco Use   Smoking status: Former    Types: Cigars   Smokeless tobacco: Never  Vaping Use   Vaping status: Never Used  Substance Use Topics   Alcohol use: Not Currently   Drug use: Not Currently    Types: Marijuana    Comment: none with + UPT    Allergies: No Known Allergies  No medications prior to admission.    Review of Systems  Constitutional:  Negative for chills and fever.  HENT:  Negative for congestion.   Respiratory:  Negative for chest tightness and shortness of breath.   Cardiovascular:  Negative for chest pain and palpitations.  Gastrointestinal:  Positive for abdominal pain and nausea. Negative for constipation, diarrhea and vomiting.  Genitourinary:  Positive for pelvic  pain. Negative for dysuria, frequency, vaginal bleeding and vaginal discharge.  Musculoskeletal:  Negative for back pain.  Skin:  Negative for rash.  Neurological:  Negative for light-headedness and headaches.   Physical Exam   Blood pressure (!) 118/95, pulse 72, temperature 98.1 F (36.7 C), temperature source Oral, resp. rate 18, height 5\' 2"  (1.575 m), weight 69.2 kg, last menstrual period 08/18/2022, SpO2 100%.  Physical Exam Constitutional:      General: She is not in acute distress.    Appearance: She is well-developed.  HENT:     Head: Normocephalic and atraumatic.  Abdominal:     General: There is no distension.     Palpations: Abdomen is soft.     Tenderness: There is no abdominal tenderness. There is no right CVA tenderness or left CVA tenderness.  Skin:    General: Skin is warm and dry.     Findings: No rash.  Neurological:     General: No focal deficit present.     Mental Status: She is alert and oriented to person, place, and time.     MAU Course  Procedures  MDM 32 y.o. [id number] at [redacted]w[redacted]d who presents today with cramping. Vital signs stable -- had isolated elevated diastolic BP at the time of discharge -- no sxs. U/S with early intrauterine GS with internal YS, but no FP or cardiac  activity. Pt is considering termination of pregnancy, but has not made a decision quite yet -- given resources to reach out to if she decides to proceed with termination. In the interim, scheduled viability U/S for 9/16 with [facility] -- pt may continue to follow at [facility], but not entirely sure, so will let us know, but message sent to [facility] to help with scheduling IOB appt. Return precautions discussed. Stable for d/c.  Assessment and Plan  Abdominal pain in pregnancy, first trimester - Plan: Discharge patient  Early stage of pregnancy  Plan discussed with Dr. Alysia Penna.   Sundra Aland, MD OB Fellow, Faculty Practice [facility], [facility] for [facility]  10/01/2022, 9:20 PM

## 2022-10-01 NOTE — MAU Note (Signed)
.  Heather Malone is a 32 y.o. at [redacted]w[redacted]d here in MAU reporting: she's having lower intermittent abdominal pain for 2 days that's sharp and cramping. Denies VB. LMP: 08/18/2022 Onset of complaint: 2 days  Pain score: 7 Vitals:   10/01/22 1625  BP: 118/86  Pulse: 84  Resp: 18  Temp: 98.1 F (36.7 C)  SpO2: 100%     FHT:NA Lab orders placed from triage:   UA

## 2022-10-02 LAB — GC/CHLAMYDIA PROBE AMP (~~LOC~~) NOT AT ARMC
Chlamydia: NEGATIVE
Comment: NEGATIVE
Comment: NORMAL
Neisseria Gonorrhea: NEGATIVE

## 2022-10-02 NOTE — Telephone Encounter (Signed)
Per chart review, pt had MAU visit on 9/4. Ultrasound showed IUGS and YS but no FP or cardiac activity.  She has follow up ultrasound scheduled on 9/16. As pt's request for ultrasound has been met, a return call was not placed.

## 2022-10-13 ENCOUNTER — Ambulatory Visit (INDEPENDENT_AMBULATORY_CARE_PROVIDER_SITE_OTHER): Payer: Self-pay

## 2022-10-13 ENCOUNTER — Other Ambulatory Visit: Payer: Self-pay | Admitting: General Practice

## 2022-10-13 ENCOUNTER — Other Ambulatory Visit: Payer: Self-pay

## 2022-10-13 DIAGNOSIS — O3680X0 Pregnancy with inconclusive fetal viability, not applicable or unspecified: Secondary | ICD-10-CM

## 2022-10-13 DIAGNOSIS — Z3A01 Less than 8 weeks gestation of pregnancy: Secondary | ICD-10-CM

## 2022-10-16 NOTE — Progress Notes (Signed)
U/S with [facility], cardiac activity visualized. Will forward to clinical pool to communicate results to pt.

## 2022-10-17 ENCOUNTER — Telehealth: Payer: Self-pay

## 2022-10-17 NOTE — Telephone Encounter (Addendum)
-----   Message from Sundra Aland sent at 10/16/2022  8:37 PM EDT ----- U/S with [facility], cardiac activity visualized. Will forward to clinical pool to communicate results to pt.   Called pt; mailbox full and unable to accept messages.

## 2022-10-21 NOTE — Telephone Encounter (Signed)
Called patient and informed her of normal ultrasound results as well as reviewed dating information. Patient verbalized understanding.

## 2022-10-23 ENCOUNTER — Telehealth: Payer: Self-pay

## 2022-10-29 ENCOUNTER — Ambulatory Visit (INDEPENDENT_AMBULATORY_CARE_PROVIDER_SITE_OTHER): Payer: Medicaid Other | Admitting: *Deleted

## 2022-10-29 ENCOUNTER — Other Ambulatory Visit: Payer: Self-pay

## 2022-10-29 VITALS — BP 119/86 | HR 69 | Wt 147.6 lb

## 2022-10-29 DIAGNOSIS — O0990 Supervision of high risk pregnancy, unspecified, unspecified trimester: Secondary | ICD-10-CM | POA: Diagnosis not present

## 2022-10-29 DIAGNOSIS — O0991 Supervision of high risk pregnancy, unspecified, first trimester: Secondary | ICD-10-CM

## 2022-10-29 DIAGNOSIS — Z3A09 9 weeks gestation of pregnancy: Secondary | ICD-10-CM

## 2022-10-29 DIAGNOSIS — Z8759 Personal history of other complications of pregnancy, childbirth and the puerperium: Secondary | ICD-10-CM | POA: Insufficient documentation

## 2022-10-29 NOTE — Progress Notes (Signed)
New OB Intake  Visit done in person as patient came to office today.  I explained I am completing New OB Intake today. We discussed EDD of 05/25/2023, Date entered prior to episode creation. Pt is [id number]. I reviewed her allergies, medications and Medical/Surgical/OB history.    Patient Active Problem List   Diagnosis Date Noted   Supervision of high risk pregnancy, antepartum 10/29/2022   History of severe pre-eclampsia 10/29/2022   Elevated blood pressure complicating pregnancy in first trimester, antepartum 10/01/2022   Alpha thalassemia silent carrier 08/02/2020   Chronic headaches 04/12/2020    Concerns addressed today  Delivery Plans Plans to deliver at [facility]. Discussed the nature of our practice with multiple providers including residents and students. Due to the size of the practice, the delivering provider may not be the same as those providing prenatal care.   Patient is interested in water birth. Offered upcoming OB visit with CNM to discuss further.  MyChart/Babyscripts MyChart access verified. I explained pt will have some visits in office and some virtually. Babyscripts instructions given and order placed.  Blood Pressure Cuff/Weight Scale She does not have a bp cuff and has applied for medicaid . Explained we can order once she has medicaid.  Explained after first prenatal appt pt will check weekly and document in Babyscripts. Patient does not have weight scale and explained we can order RX once she has medicaid. .  Anatomy US Explained first scheduled [place] will be around 19 weeks. Anatomy US scheduled for 01/08/23 at 0815.  Is patient a CenteringPregnancy candidate?  Declined Declined due to prefers traditional.   Is patient a Mom+Baby Combined Care candidate?  Not a candidate    Interested in [place]? Declines   Is patient a candidate for Babyscripts Optimization? No - Hx pre-ecclampsia, elevated BP in MAU x1  First visit review I reviewed new OB appt with  patient. Explained pt will be seen by Edd Arbour ,CNM at first visit. Discussed Avelina Laine genetic screening with patient. She would like  Panorama drawn when she is past 10 weeks.  Routine prenatal labs drawn today.    Last Pap Diagnosis  Date Value Ref Range Status  04/12/2020   Final   - Negative for intraepithelial lesion or malignancy (NILM)    Nancy Fetter 10/29/2022  11:49 AM

## 2022-10-30 ENCOUNTER — Inpatient Hospital Stay (HOSPITAL_COMMUNITY)
Admission: AD | Admit: 2022-10-30 | Discharge: 2022-10-30 | Disposition: A | Discharge status: Home / Self Care | Payer: Medicaid Other | Attending: Family Medicine | Admitting: Family Medicine

## 2022-10-30 DIAGNOSIS — O26891 Other specified pregnancy related conditions, first trimester: Secondary | ICD-10-CM | POA: Diagnosis not present

## 2022-10-30 DIAGNOSIS — R030 Elevated blood-pressure reading, without diagnosis of hypertension: Secondary | ICD-10-CM | POA: Diagnosis present

## 2022-10-30 DIAGNOSIS — O418X10 Other specified disorders of amniotic fluid and membranes, first trimester, not applicable or unspecified: Secondary | ICD-10-CM

## 2022-10-30 DIAGNOSIS — R519 Headache, unspecified: Secondary | ICD-10-CM | POA: Insufficient documentation

## 2022-10-30 DIAGNOSIS — Z3A09 9 weeks gestation of pregnancy: Secondary | ICD-10-CM | POA: Insufficient documentation

## 2022-10-30 LAB — CBC/D/PLT+RPR+RH+ABO+RUBIGG...
Antibody Screen: NEGATIVE
Basophils Absolute: 0 10*3/uL (ref 0.0–0.2)
Basos: 1 %
EOS (ABSOLUTE): 0.1 10*3/uL (ref 0.0–0.4)
Eos: 2 %
HCV Ab: NONREACTIVE
HIV Screen 4th Generation wRfx: NONREACTIVE
Hematocrit: 34.7 % (ref 34.0–46.6)
Hemoglobin: 11.2 g/dL (ref 11.1–15.9)
Hepatitis B Surface Ag: NEGATIVE
Immature Grans (Abs): 0 10*3/uL (ref 0.0–0.1)
Immature Granulocytes: 0 %
Lymphocytes Absolute: 1.8 10*3/uL (ref 0.7–3.1)
Lymphs: 39 %
MCH: 27.7 pg (ref 26.6–33.0)
MCHC: 32.3 g/dL (ref 31.5–35.7)
MCV: 86 fL (ref 79–97)
Monocytes Absolute: 0.4 10*3/uL (ref 0.1–0.9)
Monocytes: 8 %
Neutrophils Absolute: 2.3 10*3/uL (ref 1.4–7.0)
Neutrophils: 50 %
Platelets: 312 10*3/uL (ref 150–450)
RBC: 4.05 x10E6/uL (ref 3.77–5.28)
RDW: 14.8 % (ref 11.7–15.4)
RPR Ser Ql: NONREACTIVE
Rh Factor: POSITIVE
Rubella Antibodies, IGG: 2.56 {index} (ref >0.99)
WBC: 4.6 10*3/uL (ref 3.4–10.8)

## 2022-10-30 LAB — HCV INTERPRETATION

## 2022-10-30 LAB — HEMOGLOBIN A1C
Est. average glucose Bld gHb Est-mCnc: 114 mg/dL
Hgb A1c MFr Bld: 5.6 % (ref 4.8–5.6)

## 2022-10-30 LAB — WET PREP, GENITAL
Clue Cells Wet Prep HPF POC: NONE SEEN
Trich, Wet Prep: NONE SEEN
WBC, Wet Prep HPF POC: <10 (ref <10)
Yeast Wet Prep HPF POC: NONE SEEN

## 2022-10-30 MED ORDER — CYCLOBENZAPRINE HCL 10 MG PO TABS
10.0000 mg | ORAL_TABLET | Freq: Three times a day (TID) | ORAL | 1 refills | Status: DC | PRN
Start: 2022-10-30 — End: 2023-02-18

## 2022-10-30 MED ORDER — BUTALBITAL-APAP-CAFFEINE 50-325-40 MG PO CAPS: 1.0000 | ORAL_CAPSULE | Freq: Four times a day (QID) | ORAL | 3 refills | Status: DC | PRN

## 2022-10-30 NOTE — MAU Provider Note (Signed)
S Heather Malone is a 32 y.o. [phone] pregnant female at [redacted]w[redacted]d who presents to MAU today with complaint of left-sided migraine (has a history of chronic headaches) with visual disturbances and light sensitivity. Headache started last night before she went to bed. Took her BP this morning when she woke up and it was a little elevated (130s/80s) which made her concerned about preeclampsia.   Also having some cramping on her lower left side, denies any vaginal bleeding or discharge, cramping is mild and not constant.  Receives care at [facility], has first OB visit next week. Prenatal records reviewed. Has a documented moderate SCH from the first ultrasound, which is also noted on the 2nd u/s images.  Pertinent items noted in HPI and remainder of comprehensive ROS otherwise negative.   O BP 132/87 (BP Location: Right Arm)   Pulse 77   Temp 98.1 F (36.7 C) (Oral)   Resp 17   Ht 5\' 2"  (1.575 m)   Wt 148 lb 12.8 oz (67.5 kg)   LMP 08/18/2022 (Exact Date)   SpO2 99%   BMI 27.22 kg/m  Physical Exam Vitals and nursing note reviewed.  Constitutional:      General: She is not in acute distress.    Appearance: She is well-developed and normal weight. She is not ill-appearing.  Cardiovascular:     Rate and Rhythm: Normal rate and regular rhythm.  Pulmonary:     Effort: Pulmonary effort is normal.  Abdominal:     Tenderness: There is no abdominal tenderness.  Skin:    General: Skin is warm and dry.     Capillary Refill: Capillary refill takes less than 2 seconds.  Neurological:     Mental Status: She is alert and oriented to person, place, and time.  Psychiatric:        Mood and Affect: Mood normal. Mood is not anxious.        Behavior: Behavior normal.   MDM: Low MAU Course: No bleeding or discharge so will defer pelvic exam, suspect cramping related to [facility]. Explained [facility] to patient and increased likelihood of at least spotting, also gave bleeding precautions.  Headache not as bad  by CNM assessment and MAU acuity very high. Explained to pt that preeclampsia is a complication of pregnancy that occurs after 20wks. Reassured her that we will be monitoring her BP closely during this pregnancy. Offered to treat her headache here or send her home with prescriptions for headache meds, which she accepted.   A Pregnancy headache in first trimester - Plan: Discharge patient  Subchorionic hematoma in first trimester, single or unspecified fetus  [redacted] weeks gestation of pregnancy   P Discharge from MAU in stable condition with bleeding precautions Follow up at [facility] as scheduled for ongoing prenatal care  Allergies as of 10/30/2022   No Known Allergies      Medication List     TAKE these medications    Butalbital-APAP-Caffeine 50-325-40 MG capsule Take 1-2 capsules by mouth every 6 (six) hours as needed for headache.   cyclobenzaprine 10 MG tablet Commonly known as: FLEXERIL Take 1 tablet (10 mg total) by mouth every 8 (eight) hours as needed for muscle spasms.   multivitamin-prenatal 27-0.8 MG Tabs tablet Take 1 tablet by mouth daily at 12 noon.       Bernerd Limbo, CNM 10/30/2022 2:00 PM

## 2022-10-30 NOTE — MAU Note (Signed)
Unable to leave urine sample at this time.

## 2022-10-30 NOTE — MAU Note (Signed)
.  Heather Malone is a 32 y.o. at [redacted]w[redacted]d here in MAU reporting: Constant HA since last night as well as elevated BP's this morning. She reports dark floaters. She is also reporting left lower abdominal cramping that is intermittent. Concerned for pre-eclampsia due to history. Denies VB or vaginal discharge.  Has taken 1000 mg of Tylenol today around 0900.   Onset of complaint: Last night Pain scores:  8/10 HA 7/10 left lower abdomen    FHT: n/a Lab orders placed from triage: UA

## 2022-10-31 LAB — URINE CULTURE, OB REFLEX

## 2022-10-31 LAB — CULTURE, OB URINE

## 2022-11-04 NOTE — Progress Notes (Unsigned)
History:   Heather Malone is a 32 y.o. [id number] at [redacted]w[redacted]d by {Ob dating:14516} being seen today for her first obstetrical visit.  Her obstetrical history is significant for {ob risk factors:10154}. Patient {does/does not:19097} intend to breast feed. Pregnancy history fully reviewed.  Patient reports {sx:14538}.      HISTORY: OB History  Gravida Para Term Preterm AB Living  3 2 1 1  0 2  SAB IAB Ectopic Multiple Live Births  0 0 0 0 2    # Outcome Date GA Lbr Len/2nd Weight Sex Type Anes PTL Lv  3 Current           2 Preterm 09/12/20 [redacted]w[redacted]d 03:28 4 lb 3 oz (1.9 kg) F Vag-Spont None  LIV     Birth Comments: IOL for severe pre-eclampsia     Name: Balis,GIRL Sherrise     Apgar1: 5  Apgar5: 9  1 Term 09/05/14 [redacted]w[redacted]d 1578:09 / 00:10 7 lb 5.8 oz (3.34 kg) M Vag-Spont None  LIV     Birth Comments: placenta abrution, velamentous cord insertion     Apgar1: 8  Apgar5: 9    Last pap smear was done *** and was {Normal/Abnormal Appearance:21344::"normal"}  Past Medical History:  Diagnosis Date   Anemia    Anxiety    Asthma    albuterol prn   Headache    migraines   Hx of trichomoniasis    Hx of varicella    Neck injury, subsequent encounter 07/26/2016   Placental abruption in third trimester 09/04/2014   Pre-eclampsia    Subchorionic hemorrhage in third trimester 07/26/2014   Velamentous insertion of umbilical cord    Past Surgical History:  Procedure Laterality Date   TONSILLECTOMY     Family History  Problem Relation Age of Onset   Hypertension Mother    Hypertension Maternal Grandmother    Heart attack Maternal Grandmother    Hypertension Maternal Grandfather    Cancer Maternal Grandfather    Diabetes Paternal Grandmother    Social History   Tobacco Use   Smoking status: Former    Types: Cigars   Smokeless tobacco: Never  Vaping Use   Vaping status: Never Used  Substance Use Topics   Alcohol use: Not Currently    Comment: weekends   Drug use: Not Currently     Types: Marijuana    Comment: none with + UPT   No Known Allergies Current Outpatient Medications on File Prior to Visit  Medication Sig Dispense Refill   Butalbital-APAP-Caffeine 50-325-40 MG capsule Take 1-2 capsules by mouth every 6 (six) hours as needed for headache. 30 capsule 3   cyclobenzaprine (FLEXERIL) 10 MG tablet Take 1 tablet (10 mg total) by mouth every 8 (eight) hours as needed for muscle spasms. 30 tablet 1   Prenatal Vit-Fe Fumarate-FA (MULTIVITAMIN-PRENATAL) 27-0.8 MG TABS tablet Take 1 tablet by mouth daily at 12 noon. 30 tablet 0   No current facility-administered medications on file prior to visit.    Review of Systems Pertinent items noted in HPI and remainder of comprehensive ROS otherwise negative. Physical Exam:  There were no vitals filed for this visit.    Constitutional: Well-developed, well-nourished pregnant female in no acute distress.  HEENT: PERRLA Skin: normal color and turgor, no rash Cardiovascular: normal rate & rhythm, warm and well perfused Respiratory: normal effort, no problems with respiration noted GI: Abd soft, non-distended MS: Extremities nontender, no edema, normal ROM Neurologic: Alert and oriented x 4.  GU: no CVA  tenderness Pelvic: NEFG, physiologic discharge, no blood, cervix clean. ***Pap/swabs collected ***Exam deferred  Assessment:    Pregnancy: [id number] Patient Active Problem List   Diagnosis Date Noted   Supervision of high-risk pregnancy 10/29/2022   History of severe pre-eclampsia 10/29/2022   Elevated blood pressure complicating pregnancy in first trimester, antepartum 10/01/2022   Alpha thalassemia silent carrier 08/02/2020   Chronic headaches 04/12/2020   Subchorionic hematoma 07/26/2014     Plan:    1. Supervision of high risk pregnancy in first trimester ***  2. [redacted] weeks gestation of pregnancy ***  3. Chronic tension-type headache, not intractable ***  4. Elevated blood pressure complicating pregnancy  in first trimester, antepartum ***  5. History of severe pre-eclampsia ***  6. Subchorionic hematoma in first trimester, single or unspecified fetus ***  7. Initial obstetric visit in the first trimester - Initial labs drawn. - Continue prenatal vitamins. - Problem list reviewed and updated. - Genetic Screening discussed, {Blank multiple:19196::"First trimester screen","Quad screen","NIPS"}: {requests/ordered/declines:14581}. - Ultrasound discussed; fetal anatomic survey: {requests/ordered/declines:14581}. - Anticipatory guidance about prenatal visits given including labs, ultrasounds, and testing. - Discussed usage of Babyscripts and virtual visits as additional source of managing and completing prenatal visits in midst of coronavirus and pandemic.   - Encouraged to complete MyChart Registration for her ability to review results, send requests, and have questions addressed.  - The nature of [place] [facility] Healthcare/Faculty Practice with multiple MDs and Advanced Practice Providers was explained to patient; also emphasized that residents, students are part of our team. - Routine obstetric precautions reviewed. Encouraged to seek out care at office or emergency room [facility] MAU preferred) for urgent and/or emergent concerns.  No follow-ups on file.    Future Appointments  Date Time Provider Department Center  11/05/2022  9:55 AM Osborne Oman [facility]  01/08/2023  8:15 AM [facility] NURSE [facility]  01/08/2023  8:30 AM [facility] US3 [facility]    Edd Arbour, MSN, CNM, IBCLC Certified Nurse Midwife, [facility]

## 2022-11-05 ENCOUNTER — Other Ambulatory Visit: Payer: Self-pay

## 2022-11-05 ENCOUNTER — Ambulatory Visit (INDEPENDENT_AMBULATORY_CARE_PROVIDER_SITE_OTHER): Payer: Medicaid Other | Admitting: Certified Nurse Midwife

## 2022-11-05 VITALS — BP 111/86 | HR 77 | Wt 151.0 lb

## 2022-11-05 DIAGNOSIS — G44229 Chronic tension-type headache, not intractable: Secondary | ICD-10-CM

## 2022-11-05 DIAGNOSIS — O468X1 Other antepartum hemorrhage, first trimester: Secondary | ICD-10-CM

## 2022-11-05 DIAGNOSIS — O0991 Supervision of high risk pregnancy, unspecified, first trimester: Secondary | ICD-10-CM

## 2022-11-05 DIAGNOSIS — O09892 Supervision of other high risk pregnancies, second trimester: Secondary | ICD-10-CM | POA: Diagnosis not present

## 2022-11-05 DIAGNOSIS — O418X10 Other specified disorders of amniotic fluid and membranes, first trimester, not applicable or unspecified: Secondary | ICD-10-CM | POA: Diagnosis not present

## 2022-11-05 DIAGNOSIS — Z3A09 9 weeks gestation of pregnancy: Secondary | ICD-10-CM | POA: Diagnosis not present

## 2022-11-05 DIAGNOSIS — O161 Unspecified maternal hypertension, first trimester: Secondary | ICD-10-CM | POA: Diagnosis not present

## 2022-11-05 DIAGNOSIS — O219 Vomiting of pregnancy, unspecified: Secondary | ICD-10-CM | POA: Diagnosis not present

## 2022-11-05 DIAGNOSIS — Z8759 Personal history of other complications of pregnancy, childbirth and the puerperium: Secondary | ICD-10-CM

## 2022-11-05 DIAGNOSIS — Z3491 Encounter for supervision of normal pregnancy, unspecified, first trimester: Secondary | ICD-10-CM

## 2022-11-05 MED ORDER — BONJESTA 20-20 MG PO TBCR: 1.0000 | EXTENDED_RELEASE_TABLET | Freq: Every day | ORAL | 3 refills | Status: DC

## 2022-11-05 MED ORDER — ONDANSETRON HCL 8 MG PO TABS
8.0000 mg | ORAL_TABLET | Freq: Three times a day (TID) | ORAL | 0 refills | Status: DC | PRN
Start: 2022-11-05 — End: 2023-02-18

## 2022-11-06 LAB — COMPREHENSIVE METABOLIC PANEL
ALT: 10 [IU]/L (ref 0–32)
AST: 10 [IU]/L (ref 0–40)
Albumin: 4.3 g/dL (ref 3.9–4.9)
Alkaline Phosphatase: 32 [IU]/L — ABNORMAL LOW (ref 44–121)
BUN/Creatinine Ratio: 11 (ref 9–23)
BUN: 5 mg/dL — ABNORMAL LOW (ref 6–20)
Bilirubin Total: 0.2 mg/dL (ref 0.0–1.2)
CO2: 21 mmol/L (ref 20–29)
Calcium: 9.4 mg/dL (ref 8.7–10.2)
Chloride: 103 mmol/L (ref 96–106)
Creatinine, Ser: 0.46 mg/dL — ABNORMAL LOW (ref 0.57–1.00)
Globulin, Total: 2.6 g/dL (ref 1.5–4.5)
Glucose: 79 mg/dL (ref 70–99)
Potassium: 4.4 mmol/L (ref 3.5–5.2)
Sodium: 134 mmol/L (ref 134–144)
Total Protein: 6.9 g/dL (ref 6.0–8.5)
eGFR: 130 mL/min/{1.73_m2} (ref >59)

## 2022-11-06 LAB — PROTEIN / CREATININE RATIO, URINE
Creatinine, Urine: 131.5 mg/dL
Protein, Ur: 8.7 mg/dL
Protein/Creat Ratio: 66 mg/g{creat} (ref 0–200)

## 2022-11-07 ENCOUNTER — Inpatient Hospital Stay (HOSPITAL_COMMUNITY): Payer: Medicaid Other

## 2022-11-07 ENCOUNTER — Other Ambulatory Visit: Payer: Self-pay

## 2022-11-07 ENCOUNTER — Inpatient Hospital Stay (HOSPITAL_COMMUNITY)
Admission: AD | Admit: 2022-11-07 | Discharge: 2022-11-07 | Disposition: A | Discharge status: Home / Self Care | Payer: Medicaid Other | Attending: Obstetrics & Gynecology | Admitting: Obstetrics & Gynecology

## 2022-11-07 ENCOUNTER — Telehealth: Payer: Medicaid Other | Admitting: Family Medicine

## 2022-11-07 ENCOUNTER — Encounter (HOSPITAL_COMMUNITY): Payer: Self-pay | Admitting: Obstetrics & Gynecology

## 2022-11-07 DIAGNOSIS — Z3491 Encounter for supervision of normal pregnancy, unspecified, first trimester: Secondary | ICD-10-CM

## 2022-11-07 DIAGNOSIS — O26891 Other specified pregnancy related conditions, first trimester: Secondary | ICD-10-CM | POA: Insufficient documentation

## 2022-11-07 DIAGNOSIS — R519 Headache, unspecified: Secondary | ICD-10-CM | POA: Insufficient documentation

## 2022-11-07 DIAGNOSIS — Z3A10 10 weeks gestation of pregnancy: Secondary | ICD-10-CM | POA: Diagnosis not present

## 2022-11-07 DIAGNOSIS — O208 Other hemorrhage in early pregnancy: Secondary | ICD-10-CM | POA: Diagnosis not present

## 2022-11-07 DIAGNOSIS — Z3490 Encounter for supervision of normal pregnancy, unspecified, unspecified trimester: Secondary | ICD-10-CM

## 2022-11-07 DIAGNOSIS — O09291 Supervision of pregnancy with other poor reproductive or obstetric history, first trimester: Secondary | ICD-10-CM | POA: Insufficient documentation

## 2022-11-07 DIAGNOSIS — R109 Unspecified abdominal pain: Secondary | ICD-10-CM | POA: Insufficient documentation

## 2022-11-07 DIAGNOSIS — O219 Vomiting of pregnancy, unspecified: Secondary | ICD-10-CM | POA: Diagnosis not present

## 2022-11-07 DIAGNOSIS — O26899 Other specified pregnancy related conditions, unspecified trimester: Secondary | ICD-10-CM

## 2022-11-07 DIAGNOSIS — O418X10 Other specified disorders of amniotic fluid and membranes, first trimester, not applicable or unspecified: Secondary | ICD-10-CM

## 2022-11-07 LAB — URINALYSIS, ROUTINE W REFLEX MICROSCOPIC
Bilirubin Urine: NEGATIVE
Glucose, UA: NEGATIVE mg/dL
Hgb urine dipstick: NEGATIVE
Ketones, ur: NEGATIVE mg/dL
Leukocytes,Ua: NEGATIVE
Nitrite: NEGATIVE
Protein, ur: NEGATIVE mg/dL
Specific Gravity, Urine: 1.016 (ref 1.005–1.030)
pH: 6 (ref 5.0–8.0)

## 2022-11-07 LAB — WET PREP, GENITAL
Clue Cells Wet Prep HPF POC: NONE SEEN
Sperm: NONE SEEN
Trich, Wet Prep: NONE SEEN
WBC, Wet Prep HPF POC: <10 (ref <10)
Yeast Wet Prep HPF POC: NONE SEEN

## 2022-11-07 MED ORDER — DIPHENHYDRAMINE HCL 50 MG/ML IJ SOLN
12.5000 mg | Freq: Once | INTRAMUSCULAR | Status: AC
  Administered 2022-11-07: 12.5 mg via INTRAVENOUS
  Filled 2022-11-07: qty 1

## 2022-11-07 MED ORDER — LACTATED RINGERS IV BOLUS
1000.0000 mL | Freq: Once | INTRAVENOUS | Status: AC
  Administered 2022-11-07: 1000 mL via INTRAVENOUS

## 2022-11-07 MED ORDER — HYDROMORPHONE HCL 1 MG/ML IJ SOLN
1.0000 mg | Freq: Once | INTRAMUSCULAR | Status: AC
  Administered 2022-11-07: 1 mg via INTRAVENOUS
  Filled 2022-11-07: qty 1

## 2022-11-07 MED ORDER — CYCLOBENZAPRINE HCL 5 MG PO TABS
10.0000 mg | ORAL_TABLET | Freq: Once | ORAL | Status: AC
  Administered 2022-11-07: 10 mg via ORAL
  Filled 2022-11-07: qty 2

## 2022-11-07 MED ORDER — DEXAMETHASONE SODIUM PHOSPHATE 10 MG/ML IJ SOLN
10.0000 mg | Freq: Once | INTRAMUSCULAR | Status: AC
  Administered 2022-11-07: 10 mg via INTRAVENOUS
  Filled 2022-11-07: qty 1

## 2022-11-07 MED ORDER — ONDANSETRON HCL 4 MG/2ML IJ SOLN
4.0000 mg | Freq: Once | INTRAMUSCULAR | Status: AC
  Administered 2022-11-07: 4 mg via INTRAVENOUS
  Filled 2022-11-07: qty 2

## 2022-11-07 MED ORDER — METOCLOPRAMIDE HCL 5 MG/ML IJ SOLN
10.0000 mg | Freq: Once | INTRAMUSCULAR | Status: AC
  Administered 2022-11-07: 10 mg via INTRAVENOUS
  Filled 2022-11-07: qty 2

## 2022-11-07 MED ORDER — OXYCODONE HCL 5 MG PO TABS: 5.0000 mg | ORAL_TABLET | ORAL | 0 refills | Status: AC | PRN

## 2022-11-07 NOTE — MAU Provider Note (Signed)
Chief Complaint:  Headache, Abdominal Pain, and Vaginal Bleeding  HPI   Event Date/Time   First Provider Initiated Contact with Patient 11/07/22 1004     Heather Malone is a 32 y.o. [id number] at [redacted]w[redacted]d who presents to maternity admissions reporting severe headache unresponsive to tylenol/flexeril since last night, nausea with vomiting unresponsive to bongesta/oral zofran, low back and abdominal pain that got worse last night as well and very light pink spotting this morning. No other physical complaints.  Pregnancy Course: Receives care at [facility], has a known moderate [facility], a history of chronic migraines and has had n/v with all prior pregnancies. Was tolerable at last visit on 11/05/22.  Past Medical History:  Diagnosis Date   Anemia    Anxiety    Asthma    albuterol prn   Headache    migraines   Hx of trichomoniasis    Hx of varicella    Neck injury, subsequent encounter 07/26/2016   Placental abruption in third trimester 09/04/2014   Pre-eclampsia    Subchorionic hemorrhage in third trimester 07/26/2014   Velamentous insertion of umbilical cord    OB History  Gravida Para Term Preterm AB Living  3 2 1 1  0 2  SAB IAB Ectopic Multiple Live Births  0 0 0 0 2    # Outcome Date GA Lbr Len/2nd Weight Sex Type Anes PTL Lv  3 Current           2 Preterm 09/12/20 [redacted]w[redacted]d 03:28 4 lb 3 oz (1.9 kg) F Vag-Spont None  LIV     Birth Comments: IOL for severe pre-eclampsia  1 Term 09/05/14 [redacted]w[redacted]d 1578:09 / 00:10 7 lb 5.8 oz (3.34 kg) M Vag-Spont None  LIV     Birth Comments: placenta abrution, velamentous cord insertion   Past Surgical History:  Procedure Laterality Date   TONSILLECTOMY     Family History  Problem Relation Age of Onset   Hypertension Mother    Hypertension Maternal Grandmother    Heart attack Maternal Grandmother    Hypertension Maternal Grandfather    Cancer Maternal Grandfather    Diabetes Paternal Grandmother    Social History   Tobacco Use   Smoking status:  Former    Types: Cigars   Smokeless tobacco: Never  Vaping Use   Vaping status: Never Used  Substance Use Topics   Alcohol use: Not Currently    Comment: weekends   Drug use: Not Currently    Types: Marijuana    Comment: none with + UPT   No Known Allergies Medications Prior to Admission  Medication Sig Dispense Refill Last Dose   Butalbital-APAP-Caffeine 50-325-40 MG capsule Take 1-2 capsules by mouth every 6 (six) hours as needed for headache. 30 capsule 3 11/06/2022 at 1100   cyclobenzaprine (FLEXERIL) 10 MG tablet Take 1 tablet (10 mg total) by mouth every 8 (eight) hours as needed for muscle spasms. 30 tablet 1 Past Week   Prenatal Vit-Fe Fumarate-FA (MULTIVITAMIN-PRENATAL) 27-0.8 MG TABS tablet Take 1 tablet by mouth daily at 12 noon. 30 tablet 0 11/06/2022 at 1200   Doxylamine-Pyridoxine ER (BONJESTA) 20-20 MG TBCR Take 1 tablet by mouth at bedtime. 60 tablet 3    ondansetron (ZOFRAN) 8 MG tablet Take 1 tablet (8 mg total) by mouth every 8 (eight) hours as needed for nausea or vomiting. Can take miralax in apple juice if needed for constipation. 20 tablet 0    I have reviewed patient's Past Medical Hx, Surgical Hx, Family Hx,  Social Hx, medications and allergies.   ROS  Pertinent items noted in HPI and remainder of comprehensive ROS otherwise negative.   PHYSICAL EXAM  Patient Vitals for the past 24 hrs:  BP Temp Temp src Pulse  11/07/22 0922 129/88 97.9 F (36.6 C) Oral 74   Constitutional: Well-developed, well-nourished female in no mild distress.  Cardiovascular: normal rate & rhythm, warm and well-perfused Respiratory: normal effort, no problems with respiration noted GI: Abd soft, non-tender, non-distended MS: Extremities nontender, no edema, normal ROM Neurologic: Alert and oriented x 4.  GU: no CVA tenderness Pelvic: exam deferred  FHR: did not attempt doppler due to pt distress, 175 on u/s   Labs: Results for orders placed or performed during the hospital  encounter of 11/07/22 (from the past 24 hour(s))  Urinalysis, Routine w reflex microscopic -Urine, Clean Catch     Status: None   Collection Time: 11/07/22  9:23 AM  Result Value Ref Range   Color, Urine YELLOW YELLOW   APPearance CLEAR CLEAR   Specific Gravity, Urine 1.016 1.005 - 1.030   pH 6.0 5.0 - 8.0   Glucose, UA NEGATIVE NEGATIVE mg/dL   Hgb urine dipstick NEGATIVE NEGATIVE   Bilirubin Urine NEGATIVE NEGATIVE   Ketones, ur NEGATIVE NEGATIVE mg/dL   Protein, ur NEGATIVE NEGATIVE mg/dL   Nitrite NEGATIVE NEGATIVE   Leukocytes,Ua NEGATIVE NEGATIVE  Wet prep, genital     Status: None   Collection Time: 11/07/22  9:23 AM   Specimen: Urine, Clean Catch  Result Value Ref Range   Yeast Wet Prep HPF POC NONE SEEN NONE SEEN   Trich, Wet Prep NONE SEEN NONE SEEN   Clue Cells Wet Prep HPF POC NONE SEEN NONE SEEN   WBC, Wet Prep HPF POC <10 <10   Sperm NONE SEEN    Imaging:  US OB Limited  Result Date: 11/07/2022 CLINICAL DATA:  Pregnant.  Bleeding.  Headache EXAM: LIMITED OBSTETRIC ULTRASOUND COMPARISON:  Ultrasound 10/13/2022 and older FINDINGS: Intrauterine pregnancy identified with a gestational sac, embryo. Positive fetal heart motion of the 175 beats per minute. Yolk sac is not seen today. Small subchorionic hemorrhage. Crown-rump length 37.5 mm. Ten weeks and 5 days. Based on prior ultrasound of 10/13/2022 gestational age would be 10 weeks and 1 day, normal interval growth. MATERNAL FINDINGS: Cervix:  Appears closed. Uterus/Adnexae: The right ovary measures 3.3 x 1.8 by 1.7 cm and left 3.3 x 3.3 by 3.2 cm. No significant free fluid in the maternal pelvis. Of note patient declined endovaginal imaging today IMPRESSION: Single live intrauterine pregnancy of 10 weeks and 1 day based on prior ultrasound. Normal interval growth. Positive fetal heart motion. Small subchorionic hemorrhage. This exam is performed on an emergent basis and does not comprehensively evaluate fetal size, dating, or  anatomy; follow-up complete OB US should be considered if further fetal assessment is warranted. Electronically Signed   By: Karen Kays M.D.   On: 11/07/2022 12:39    MDM & MAU COURSE  MDM:  MAU Course: Orders Placed This Encounter  Procedures   Wet prep, genital   US OB Limited   Urinalysis, Routine w reflex microscopic -Urine, Clean Catch   Discharge patient   Meds ordered this encounter  Medications   lactated ringers bolus 1,000 mL   dexamethasone (DECADRON) injection 10 mg   diphenhydrAMINE (BENADRYL) injection 12.5 mg   metoCLOPramide (REGLAN) injection 10 mg   HYDROmorphone (DILAUDID) injection 1 mg   ondansetron (ZOFRAN) injection 4 mg  cyclobenzaprine (FLEXERIL) tablet 10 mg   oxyCODONE (ROXICODONE) 5 MG immediate release tablet    Sig: Take 1 tablet (5 mg total) by mouth every 4 (four) hours as needed for up to 3 days for severe pain.    Dispense:  18 tablet    Refill:  0   Pt heaving and in pain with light sensitivity when she was brought back to the room. LR bolus, headache cocktail, zofran and IV pain meds ordered which relieved headache from a 8/10 to 5/10 and back/abdominal pain from 10/10 to 8/10. Sent to u/s to assess fetal and [facility] status.   U/S normal with smaller [facility] and good fetal heart tones. Nausea and headache gone, abdominal pain remains. Flexeril given which relieved abdominal/back pain to a 6/10 which she reported as tolerable. Abdominal pain likely related to ongoing resolution of the [facility], gave warning signs and a short course of oxycodone. Stable for discharge home.  ASSESSMENT   1. Abdominal pain affecting pregnancy   2. Subchorionic hematoma in first trimester, single or unspecified fetus   3. Nausea and vomiting in pregnancy   4. Pregnancy headache in first trimester   5. Presence of fetal heart sounds in first trimester   6. [redacted] weeks gestation of pregnancy    PLAN  Discharge home in stable condition with bleeding precautions     Follow-up  Information     [facility] for [facility] Healthcare at [facility] Follow up.   Specialty: Obstetrics and Gynecology Why: as scheduled for ongoing prenatal care Contact information: [address] [place] [address] [phone]                Allergies as of 11/07/2022   No Known Allergies      Medication List     TAKE these medications    Bonjesta 20-20 MG Tbcr Generic drug: Doxylamine-Pyridoxine ER Take 1 tablet by mouth at bedtime.   Butalbital-APAP-Caffeine 50-325-40 MG capsule Take 1-2 capsules by mouth every 6 (six) hours as needed for headache.   cyclobenzaprine 10 MG tablet Commonly known as: FLEXERIL Take 1 tablet (10 mg total) by mouth every 8 (eight) hours as needed for muscle spasms.   multivitamin-prenatal 27-0.8 MG Tabs tablet Take 1 tablet by mouth daily at 12 noon.   ondansetron 8 MG tablet Commonly known as: Zofran Take 1 tablet (8 mg total) by mouth every 8 (eight) hours as needed for nausea or vomiting. Can take miralax in apple juice if needed for constipation.   oxyCODONE 5 MG immediate release tablet Commonly known as: Roxicodone Take 1 tablet (5 mg total) by mouth every 4 (four) hours as needed for up to 3 days for severe pain.       Edd Arbour, CNM, MSN, IBCLC Certified Nurse Midwife, [facility]

## 2022-11-07 NOTE — MAU Note (Signed)
Heather Malone is a 32 y.o. at [redacted]w[redacted]d here in MAU reporting: headache for the last 2 days with no relied with tylenol. Took 1000mg  1900-1800 last night. Reports lower abdominal and lower back pain consistent for 2 days. Reports not sleeping for 2 nights. Reports light pink vaginal spotting that started this am. States she had an OB appointment Wednesday but was unable to pick up prescribed medications. Denies any recent sexual intercourse.   Pt has hx of chronic headaches, subchorionic hemorrhage with this pregnancy, and Pre-E with previous pregnancy.  Onset of complaint: 2 days ago worse today  Pain score: 10 Vitals:   11/07/22 0922  BP: 129/88  Pulse: 74  Temp: 97.9 F (36.6 C)   FHT:did not attempt, pt too uncomfortable to lay flat  Lab orders placed from triage: ua, vaginal swabs

## 2022-11-07 NOTE — Progress Notes (Signed)
For the safety of you and your child, I recommend a face to face office visit with a health care provider.  Many mothers need to take medicines during their pregnancy and while nursing.  Almost all medicines pass into the breast milk in small quantities.  Most are generally considered safe for a mother to take but some medicines must be avoided.  After reviewing your E-Visit request, I recommend that you consult your OB/GYN or pediatrician for medical advice in relation to your condition and prescription medications while pregnant or breastfeeding.  NOTE:  There will be NO CHARGE for this eVisit  If you are having a true medical emergency please call 911.    For an urgent face to face visit, [place] has six urgent care centers for your convenience:     [facility] [place] [facility] [place] Get Driving Directions [id number] [place], [place] [address]    [facility]) Get Driving Directions [id number] [place], [place] [address]  [facility] [place] ([place]) Get Driving Directions [id number] [address] [place],  [place]  [address]  [facility]  Get Driving Directions [id number] [address] [place], [place], [place] [address]   [facility] [place] Get Driving Directions  [id number] [address] [place], [place] [address]   [facility] [place] Get Driving Directions [id number] [address], [place], [place] [address]  Your MyChart E-visit questionnaire answers were reviewed by a board certified advanced clinical practitioner to complete your personal care plan based on your specific symptoms.  Thank you for using e-Visits.   have provided 5 minutes of non face to face time during this encounter for chart review and documentation.

## 2022-11-08 DIAGNOSIS — O0990 Supervision of high risk pregnancy, unspecified, unspecified trimester: Secondary | ICD-10-CM | POA: Diagnosis not present

## 2022-11-10 LAB — GC/CHLAMYDIA PROBE AMP (~~LOC~~) NOT AT ARMC
Chlamydia: NEGATIVE
Comment: NEGATIVE
Comment: NORMAL
Neisseria Gonorrhea: NEGATIVE

## 2022-11-10 MED ORDER — BLOOD PRESSURE KIT DEVI
1.0000 | Freq: Once | 0 refills | Status: AC
Start: 2022-11-10 — End: 2022-11-10

## 2022-11-10 NOTE — Addendum Note (Signed)
Addended by: Marjo Bicker on: 11/10/2022 08:32 AM   Modules accepted: Orders

## 2022-11-12 ENCOUNTER — Encounter: Payer: Self-pay | Admitting: *Deleted

## 2022-11-13 ENCOUNTER — Encounter: Payer: Self-pay | Admitting: Certified Nurse Midwife

## 2022-11-13 LAB — PANORAMA PRENATAL TEST FULL PANEL:PANORAMA TEST PLUS 5 ADDITIONAL MICRODELETIONS: FETAL FRACTION: 8.4

## 2022-11-15 ENCOUNTER — Encounter: Payer: Self-pay | Admitting: Obstetrics and Gynecology

## 2022-11-16 ENCOUNTER — Encounter: Payer: Self-pay | Admitting: Obstetrics and Gynecology

## 2022-11-21 ENCOUNTER — Inpatient Hospital Stay (HOSPITAL_COMMUNITY): Payer: Medicaid Other

## 2022-11-21 ENCOUNTER — Encounter (HOSPITAL_COMMUNITY): Payer: Self-pay | Admitting: Obstetrics & Gynecology

## 2022-11-21 ENCOUNTER — Inpatient Hospital Stay (HOSPITAL_COMMUNITY)
Admission: AD | Admit: 2022-11-21 | Discharge: 2022-11-21 | Disposition: A | Discharge status: Home / Self Care | Payer: Medicaid Other | Attending: Obstetrics & Gynecology | Admitting: Obstetrics & Gynecology

## 2022-11-21 DIAGNOSIS — R109 Unspecified abdominal pain: Secondary | ICD-10-CM | POA: Diagnosis present

## 2022-11-21 DIAGNOSIS — O209 Hemorrhage in early pregnancy, unspecified: Secondary | ICD-10-CM | POA: Insufficient documentation

## 2022-11-21 DIAGNOSIS — R102 Pelvic and perineal pain: Secondary | ICD-10-CM

## 2022-11-21 DIAGNOSIS — O26891 Other specified pregnancy related conditions, first trimester: Secondary | ICD-10-CM | POA: Diagnosis not present

## 2022-11-21 DIAGNOSIS — Z3A12 12 weeks gestation of pregnancy: Secondary | ICD-10-CM | POA: Diagnosis not present

## 2022-11-21 DIAGNOSIS — N939 Abnormal uterine and vaginal bleeding, unspecified: Secondary | ICD-10-CM

## 2022-11-21 LAB — CBC WITH DIFFERENTIAL/PLATELET
Abs Immature Granulocytes: 0.01 10*3/uL (ref 0.00–0.07)
Basophils Absolute: 0 10*3/uL (ref 0.0–0.1)
Basophils Relative: 0 %
Eosinophils Absolute: 0.1 10*3/uL (ref 0.0–0.5)
Eosinophils Relative: 2 %
HCT: 29 % — ABNORMAL LOW (ref 36.0–46.0)
Hemoglobin: 9.9 g/dL — ABNORMAL LOW (ref 12.0–15.0)
Immature Granulocytes: 0 %
Lymphocytes Relative: 22 %
Lymphs Abs: 1.4 10*3/uL (ref 0.7–4.0)
MCH: 28 pg (ref 26.0–34.0)
MCHC: 34.1 g/dL (ref 30.0–36.0)
MCV: 81.9 fL (ref 80.0–100.0)
Monocytes Absolute: 0.4 10*3/uL (ref 0.1–1.0)
Monocytes Relative: 6 %
Neutro Abs: 4.4 10*3/uL (ref 1.7–7.7)
Neutrophils Relative %: 70 %
Platelets: 297 10*3/uL (ref 150–400)
RBC: 3.54 MIL/uL — ABNORMAL LOW (ref 3.87–5.11)
RDW: 15.1 % (ref 11.5–15.5)
WBC: 6.3 10*3/uL (ref 4.0–10.5)
nRBC: 0 % (ref 0.0–0.2)

## 2022-11-21 LAB — PROTEIN / CREATININE RATIO, URINE
Creatinine, Urine: 84 mg/dL
Protein Creatinine Ratio: 0.13 mg/mg{creat} (ref 0.00–0.15)
Total Protein, Urine: 11 mg/dL

## 2022-11-21 LAB — URINALYSIS, ROUTINE W REFLEX MICROSCOPIC
Bilirubin Urine: NEGATIVE
Glucose, UA: NEGATIVE mg/dL
Ketones, ur: NEGATIVE mg/dL
Leukocytes,Ua: NEGATIVE
Nitrite: NEGATIVE
Protein, ur: NEGATIVE mg/dL
RBC / HPF: >50 RBC/hpf (ref 0–5)
Specific Gravity, Urine: 1.015 (ref 1.005–1.030)
pH: 8 (ref 5.0–8.0)

## 2022-11-21 LAB — CBC
HCT: 29.6 % — ABNORMAL LOW (ref 36.0–46.0)
Hemoglobin: 9.9 g/dL — ABNORMAL LOW (ref 12.0–15.0)
MCH: 27.5 pg (ref 26.0–34.0)
MCHC: 33.4 g/dL (ref 30.0–36.0)
MCV: 82.2 fL (ref 80.0–100.0)
Platelets: 307 10*3/uL (ref 150–400)
RBC: 3.6 MIL/uL — ABNORMAL LOW (ref 3.87–5.11)
RDW: 15.2 % (ref 11.5–15.5)
WBC: 6.6 10*3/uL (ref 4.0–10.5)
nRBC: 0 % (ref 0.0–0.2)

## 2022-11-21 LAB — COMPREHENSIVE METABOLIC PANEL
ALT: 14 U/L (ref 0–44)
AST: 14 U/L — ABNORMAL LOW (ref 15–41)
Albumin: 3.4 g/dL — ABNORMAL LOW (ref 3.5–5.0)
Alkaline Phosphatase: 28 U/L — ABNORMAL LOW (ref 38–126)
Anion gap: 10 (ref 5–15)
BUN: 6 mg/dL (ref 6–20)
CO2: 19 mmol/L — ABNORMAL LOW (ref 22–32)
Calcium: 8.8 mg/dL — ABNORMAL LOW (ref 8.9–10.3)
Chloride: 105 mmol/L (ref 98–111)
Creatinine, Ser: 0.4 mg/dL — ABNORMAL LOW (ref 0.44–1.00)
GFR, Estimated: >60 mL/min (ref >60)
Glucose, Bld: 84 mg/dL (ref 70–99)
Potassium: 3.7 mmol/L (ref 3.5–5.1)
Sodium: 134 mmol/L — ABNORMAL LOW (ref 135–145)
Total Bilirubin: 0.4 mg/dL (ref 0.3–1.2)
Total Protein: 6.7 g/dL (ref 6.5–8.1)

## 2022-11-21 LAB — WET PREP, GENITAL
Clue Cells Wet Prep HPF POC: NONE SEEN
Sperm: NONE SEEN
Trich, Wet Prep: NONE SEEN
WBC, Wet Prep HPF POC: <10 (ref <10)
Yeast Wet Prep HPF POC: NONE SEEN

## 2022-11-21 MED ORDER — ACETAMINOPHEN-CAFFEINE 500-65 MG PO TABS
2.0000 | ORAL_TABLET | Freq: Once | ORAL | Status: AC
  Administered 2022-11-21: 2 via ORAL
  Filled 2022-11-21: qty 2

## 2022-11-21 MED ORDER — GABAPENTIN 300 MG PO CAPS: 300.0000 mg | ORAL_CAPSULE | Freq: Three times a day (TID) | ORAL | 2 refills | Status: DC | PRN

## 2022-11-21 MED ORDER — GABAPENTIN 300 MG PO CAPS
300.0000 mg | ORAL_CAPSULE | Freq: Once | ORAL | Status: AC
  Administered 2022-11-21: 300 mg via ORAL
  Filled 2022-11-21: qty 1

## 2022-11-21 MED ORDER — SIMETHICONE 80 MG PO TABS: 80.0000 mg | ORAL_TABLET | Freq: Two times a day (BID) | ORAL | 0 refills | Status: DC | PRN

## 2022-11-21 MED ORDER — HYDROMORPHONE HCL 1 MG/ML IJ SOLN
1.0000 mg | Freq: Once | INTRAMUSCULAR | Status: AC | PRN
  Administered 2022-11-21: 1 mg via INTRAVENOUS
  Filled 2022-11-21: qty 1

## 2022-11-21 MED ORDER — SIMETHICONE 40 MG/0.6ML PO SUSP
80.0000 mg | Freq: Once | ORAL | Status: AC
  Administered 2022-11-21: 80 mg via ORAL
  Filled 2022-11-21: qty 1.2

## 2022-11-21 MED ORDER — CYCLOBENZAPRINE HCL 5 MG PO TABS
10.0000 mg | ORAL_TABLET | Freq: Once | ORAL | Status: AC
  Administered 2022-11-21: 10 mg via ORAL
  Filled 2022-11-21: qty 2

## 2022-11-21 NOTE — Discharge Instructions (Signed)

## 2022-11-21 NOTE — MAU Provider Note (Signed)
Vaginal Bleeding, Pelvic Pain     S [name] is a 32 y.o. [phone] pregnant female at [redacted]w[redacted]d who presents to MAU today with complaint of pelvic pain that woke her out of her sleep with associated bleeding. States she has had some vaginal spotting when she urinates, not required pad. She states the pain feels like cramping.  Known small subchorionic hemorrhage, was told previously it was "getting smaller."  ROS negative except for consistent HA, hx of prior elevated pressure in MAU. Patient did state she had coitus at 0001 10/25 before the pain began at 0700.  Nursing was able to confirm FHTs using doppler.   Receives care at [facility]. Prenatal records reviewed.  Pertinent items noted in HPI and remainder of comprehensive ROS otherwise negative.   O BP 123/83 (BP Location: Left Arm)   Pulse 75   Temp 98.7 F (37.1 C) (Oral)   Resp 15   LMP 08/18/2022 (Exact Date)   SpO2 100%  Physical Exam Vitals and nursing note reviewed.  Constitutional:      General: She is not in acute distress.    Appearance: She is well-developed and normal weight. She is not ill-appearing, toxic-appearing or diaphoretic.  HENT:     Head: Normocephalic and atraumatic.  Eyes:     Extraocular Movements: Extraocular movements intact.  Cardiovascular:     Rate and Rhythm: Normal rate.  Pulmonary:     Effort: Pulmonary effort is normal. No respiratory distress.  Abdominal:     General: Abdomen is flat. There is no distension.     Palpations: Abdomen is soft.     Tenderness: There is abdominal tenderness in the suprapubic area and left lower quadrant.  Skin:    General: Skin is warm and dry.  Neurological:     Mental Status: She is alert and oriented to person, place, and time.     Motor: No weakness.  Psychiatric:        Mood and Affect: Mood is anxious.        Behavior: Behavior normal.      MDM: Moderate MAU Course:  UPC 0.13 CBC no leukocytosis, Hgb 9.9, Plts 297 CMP LFTs normal, overall  reassuring, BUN low   UA noninfectious, moderate RBC GC collected Wet Prep negative   [place] = single international IUP @ [redacted]w[redacted]d with fetal heart activity 161bpm, small subchorionic hemorrhage, no free fluid in pelvis but neither ovary seen    Given Excedrin Migraine for HA and Flexeril for abdominal pain.  Going for [place]. Did not have relief from abdominal pain with Excedrin, HA also not relieved. Remains normotensive. Flexeril did not help with abdominal pain.  Gave gabapentin without relief. Patient now complaining that palpation to LLQ makes RLQ painful and more distended.  Will get RLQ [place] to r/o appendicitis though less likely given no leukocytosis. Repeat CBC draw, gave IV Dilaudid.  CBC stable Hgb at 9.9. RLQ [place] negative. Given simethicone 80mg .  Patient stable for d/c at this time.   A&P: #[redacted] weeks gestation # Vaginal bleeding  # Abdominal pain affecting pregnancy Sent with multimodals (gabapentin, simethicone)   Discharge from MAU in stable condition with strict / usual precautions Follow up at [facility] as scheduled for ongoing prenatal care  Allergies as of 11/21/2022   No Known Allergies      Medication List     TAKE these medications    Bonjesta 20-20 MG Tbcr Generic drug: Doxylamine-Pyridoxine ER Take 1 tablet by mouth at bedtime.  Butalbital-APAP-Caffeine 50-325-40 MG capsule Take 1-2 capsules by mouth every 6 (six) hours as needed for headache.   cyclobenzaprine 10 MG tablet Commonly known as: FLEXERIL Take 1 tablet (10 mg total) by mouth every 8 (eight) hours as needed for muscle spasms.   gabapentin 300 MG capsule Commonly known as: Neurontin Take 1 capsule (300 mg total) by mouth 3 (three) times daily as needed.   multivitamin-prenatal 27-0.8 MG Tabs tablet Take 1 tablet by mouth daily at 12 noon.   ondansetron 8 MG tablet Commonly known as: Zofran Take 1 tablet (8 mg total) by mouth every 8 (eight) hours as needed for nausea or vomiting. Can take miralax in  apple juice if needed for constipation.   oxycodone 5 MG capsule Commonly known as: OXY-IR Take 5 mg by mouth every 4 (four) hours as needed.   Simethicone 80 MG Tabs Take 1 tablet (80 mg total) by mouth 2 (two) times daily as needed.        Hessie Dibble, MD 11/21/2022 4:20 PM

## 2022-11-21 NOTE — MAU Note (Signed)
Heather Malone is a 32 y.o. at [redacted]w[redacted]d here in MAU reporting: pain woke her.  Is bleeding and cramping. Took a picture, small amt of blood. Known subchorionic hemorrhage. Reports had spotted one time Onset of complaint: 7 Pain score: "over a 10", but not as bad as labor Vitals:   11/21/22 0754  BP: 125/88  Pulse: 90  Resp: 20  Temp: 98.1 F (36.7 C)  SpO2: 100%     ZDG:LOVF try in rm.  Pt in wc. Lab orders placed from triage:

## 2022-11-24 LAB — GC/CHLAMYDIA PROBE AMP (~~LOC~~) NOT AT ARMC
Chlamydia: NEGATIVE
Comment: NEGATIVE
Comment: NORMAL
Neisseria Gonorrhea: NEGATIVE

## 2022-12-09 NOTE — Progress Notes (Unsigned)
   PRENATAL VISIT NOTE  Subjective:  Heather Malone is a 32 y.o. [id number] at [redacted]w[redacted]d being seen today for ongoing prenatal care.  She is currently monitored for the following issues for this {Blank single:19197::"high-risk","low-risk"} pregnancy and has Subchorionic hematoma; Chronic headaches; Alpha thalassemia silent carrier; Elevated blood pressure complicating pregnancy in first trimester, antepartum; Supervision of high-risk pregnancy; and History of severe pre-eclampsia on their problem list.  Patient reports {sx:14538}.   .  .   . Denies leaking of fluid.   The following portions of the patient's history were reviewed and updated as appropriate: allergies, current medications, past family history, past medical history, past social history, past surgical history and problem list.   Objective:  There were no vitals filed for this visit.  Fetal Status:           General:  Alert, oriented and cooperative. Patient is in no acute distress.  Skin: Skin is warm and dry. No rash noted.   Cardiovascular: Normal heart rate noted  Respiratory: Normal respiratory effort, no problems with respiration noted  Abdomen: Soft, gravid, appropriate for gestational age.        Pelvic: {Blank single:19197::"Cervical exam performed in the presence of a chaperone","Cervical exam deferred"}        Extremities: Normal range of motion.     Mental Status: Normal mood and affect. Normal behavior. Normal judgment and thought content.   Assessment and Plan:  Pregnancy: G3P1102 at [redacted]w[redacted]d 1. Supervision of high risk pregnancy in second trimester ***  2. [redacted] weeks gestation of pregnancy ***  3. Subchorionic hematoma in second trimester, single or unspecified fetus ***  4. Chronic tension-type headache, not intractable ***  5. Elevated blood pressure complicating pregnancy in first trimester, antepartum ***  {Blank single:19197::"Term","Preterm"} labor symptoms and general obstetric precautions including  but not limited to vaginal bleeding, contractions, leaking of fluid and fetal movement were reviewed in detail with the patient. Please refer to After Visit Summary for other counseling recommendations.   No follow-ups on file.  Future Appointments  Date Time Provider Department Center  12/10/2022  9:15 AM Osborne Oman [facility]  01/07/2023 10:55 AM Bernerd Limbo, CNM [facility]  01/08/2023  8:15 AM [facility] NURSE [facility]  01/08/2023  8:30 AM [facility] US3 [facility]    Bernerd Limbo, CNM

## 2022-12-10 ENCOUNTER — Inpatient Hospital Stay (HOSPITAL_COMMUNITY)
Admission: AD | Admit: 2022-12-10 | Discharge: 2022-12-11 | Disposition: A | Discharge status: Home / Self Care | Payer: Medicaid Other | Attending: Obstetrics & Gynecology | Admitting: Obstetrics & Gynecology

## 2022-12-10 ENCOUNTER — Encounter (HOSPITAL_COMMUNITY): Payer: Self-pay | Admitting: Obstetrics and Gynecology

## 2022-12-10 ENCOUNTER — Ambulatory Visit (INDEPENDENT_AMBULATORY_CARE_PROVIDER_SITE_OTHER): Payer: Medicaid Other | Admitting: Certified Nurse Midwife

## 2022-12-10 ENCOUNTER — Other Ambulatory Visit: Payer: Self-pay

## 2022-12-10 ENCOUNTER — Telehealth: Payer: Self-pay

## 2022-12-10 VITALS — BP 122/86 | HR 94 | Wt 152.0 lb

## 2022-12-10 DIAGNOSIS — O99891 Other specified diseases and conditions complicating pregnancy: Secondary | ICD-10-CM | POA: Diagnosis not present

## 2022-12-10 DIAGNOSIS — Z3A15 15 weeks gestation of pregnancy: Secondary | ICD-10-CM | POA: Insufficient documentation

## 2022-12-10 DIAGNOSIS — M545 Low back pain, unspecified: Secondary | ICD-10-CM | POA: Insufficient documentation

## 2022-12-10 DIAGNOSIS — O0992 Supervision of high risk pregnancy, unspecified, second trimester: Secondary | ICD-10-CM

## 2022-12-10 DIAGNOSIS — M549 Dorsalgia, unspecified: Secondary | ICD-10-CM | POA: Diagnosis not present

## 2022-12-10 DIAGNOSIS — R1110 Vomiting, unspecified: Secondary | ICD-10-CM

## 2022-12-10 DIAGNOSIS — R102 Pelvic and perineal pain: Secondary | ICD-10-CM | POA: Diagnosis not present

## 2022-12-10 DIAGNOSIS — R519 Headache, unspecified: Secondary | ICD-10-CM | POA: Diagnosis not present

## 2022-12-10 DIAGNOSIS — J45909 Unspecified asthma, uncomplicated: Secondary | ICD-10-CM

## 2022-12-10 DIAGNOSIS — O219 Vomiting of pregnancy, unspecified: Secondary | ICD-10-CM | POA: Diagnosis not present

## 2022-12-10 DIAGNOSIS — Z8759 Personal history of other complications of pregnancy, childbirth and the puerperium: Secondary | ICD-10-CM

## 2022-12-10 DIAGNOSIS — O161 Unspecified maternal hypertension, first trimester: Secondary | ICD-10-CM

## 2022-12-10 DIAGNOSIS — O26899 Other specified pregnancy related conditions, unspecified trimester: Secondary | ICD-10-CM

## 2022-12-10 DIAGNOSIS — O99512 Diseases of the respiratory system complicating pregnancy, second trimester: Secondary | ICD-10-CM

## 2022-12-10 DIAGNOSIS — O26892 Other specified pregnancy related conditions, second trimester: Secondary | ICD-10-CM | POA: Insufficient documentation

## 2022-12-10 DIAGNOSIS — O418X20 Other specified disorders of amniotic fluid and membranes, second trimester, not applicable or unspecified: Secondary | ICD-10-CM

## 2022-12-10 DIAGNOSIS — Z3A14 14 weeks gestation of pregnancy: Secondary | ICD-10-CM

## 2022-12-10 DIAGNOSIS — R112 Nausea with vomiting, unspecified: Secondary | ICD-10-CM | POA: Diagnosis present

## 2022-12-10 DIAGNOSIS — O468X2 Other antepartum hemorrhage, second trimester: Secondary | ICD-10-CM

## 2022-12-10 DIAGNOSIS — G44229 Chronic tension-type headache, not intractable: Secondary | ICD-10-CM

## 2022-12-10 DIAGNOSIS — O162 Unspecified maternal hypertension, second trimester: Secondary | ICD-10-CM

## 2022-12-10 LAB — URINALYSIS, ROUTINE W REFLEX MICROSCOPIC
Bilirubin Urine: NEGATIVE
Glucose, UA: NEGATIVE mg/dL
Hgb urine dipstick: NEGATIVE
Ketones, ur: NEGATIVE mg/dL
Nitrite: NEGATIVE
Protein, ur: NEGATIVE mg/dL
Specific Gravity, Urine: 1.024 (ref 1.005–1.030)
pH: 5 (ref 5.0–8.0)

## 2022-12-10 MED ORDER — LEVALBUTEROL TARTRATE 45 MCG/ACT IN AERO: 1.0000 | INHALATION_SPRAY | RESPIRATORY_TRACT | 12 refills | Status: DC | PRN

## 2022-12-10 MED ORDER — MAGNESIUM SULFATE 2 GM/50ML IV SOLN
2.0000 g | Freq: Once | INTRAVENOUS | Status: AC
  Administered 2022-12-11: 2 g via INTRAVENOUS
  Filled 2022-12-10: qty 50

## 2022-12-10 MED ORDER — SODIUM CHLORIDE 0.9 % IV SOLN: Freq: Once | INTRAVENOUS | Status: AC

## 2022-12-10 MED ORDER — ACETAMINOPHEN-CAFFEINE 500-65 MG PO TABS
2.0000 | ORAL_TABLET | Freq: Once | ORAL | Status: AC
  Administered 2022-12-10: 2 via ORAL
  Filled 2022-12-10: qty 2

## 2022-12-10 MED ORDER — DIPHENHYDRAMINE HCL 50 MG/ML IJ SOLN
25.0000 mg | INTRAMUSCULAR | Status: AC
  Administered 2022-12-10: 25 mg via INTRAVENOUS
  Filled 2022-12-10: qty 1

## 2022-12-10 MED ORDER — SODIUM CHLORIDE 0.9 % IV SOLN
25.0000 mg | Freq: Once | INTRAVENOUS | Status: AC
  Administered 2022-12-10: 25 mg via INTRAVENOUS
  Filled 2022-12-10: qty 1

## 2022-12-10 MED ORDER — METOCLOPRAMIDE HCL 5 MG/ML IJ SOLN
5.0000 mg | Freq: Once | INTRAMUSCULAR | Status: AC
  Administered 2022-12-10: 5 mg via INTRAVENOUS
  Filled 2022-12-10: qty 2

## 2022-12-10 MED ORDER — SODIUM CHLORIDE 0.9 % IV SOLN
8.0000 mg | Freq: Once | INTRAVENOUS | Status: AC
  Administered 2022-12-10: 8 mg via INTRAVENOUS
  Filled 2022-12-10: qty 4

## 2022-12-10 NOTE — Telephone Encounter (Signed)
Auth Submission: NO AUTH NEEDED Site of care: Site of care: [facility] Payer: [facility] Medicaid Medication & CPT/J Code(s) submitted: Solumedrol (Methylprednisolone) J2930 Route of submission (phone, fax, portal):  Phone # Fax # Auth type: Buy/Bill PB Units/visits requested: x 1 dose Reference number:  Approval from: 12/10/22 to 01/27/23

## 2022-12-10 NOTE — MAU Provider Note (Addendum)
History     CSN: [id number]  Arrival date and time: 12/10/22 1725   Event Date/Time   First Provider Initiated Contact with Patient 12/10/22 2003      Chief Complaint  Patient presents with   Abdominal Pain   Headache   Nausea   Emesis   HPI [name] is a 32 y.o. year old G64P1102 female at [redacted]w[redacted]d weeks gestation who presents to MAU reporting unable to keep anything down for 2 weeks. She report sH/A, lower abdominal cramping. She took Tylenol, Excedrin Tension Headache, and Flexeril without relief. She was seen in [facility] today and was told that someone would call her to get her in the infusion center for IVFs. She was also advised to come to MAU, if no one called her about the infusion appointment. She receives [facility] with [facility]; next appt is 01/07/2023.   OB History     Gravida  3   Para  2   Term  1   Preterm  1   AB  0   Living  2      SAB  0   IAB  0   Ectopic  0   Multiple  0   Live Births  2           Past Medical History:  Diagnosis Date   Anemia    Anxiety    Asthma    albuterol prn   Headache    migraines   Hx of trichomoniasis    Hx of varicella    Neck injury, subsequent encounter 07/26/2016   Placental abruption in third trimester 09/04/2014   Pre-eclampsia    Subchorionic hemorrhage in third trimester 07/26/2014   Velamentous insertion of umbilical cord     Past Surgical History:  Procedure Laterality Date   TONSILLECTOMY      Family History  Problem Relation Age of Onset   Hypertension Mother    Hypertension Maternal Grandmother    Heart attack Maternal Grandmother    Hypertension Maternal Grandfather    Cancer Maternal Grandfather    Diabetes Paternal Grandmother     Social History   Tobacco Use   Smoking status: Former    Types: Cigars   Smokeless tobacco: Never  Vaping Use   Vaping status: Never Used  Substance Use Topics   Alcohol use: Not Currently    Comment: weekends   Drug use: Not Currently     Types: Marijuana    Comment: none with + UPT    Allergies: No Known Allergies  Medications Prior to Admission  Medication Sig Dispense Refill Last Dose   Butalbital-APAP-Caffeine 50-325-40 MG capsule Take 1-2 capsules by mouth every 6 (six) hours as needed for headache. 30 capsule 3 12/10/2022   cyclobenzaprine (FLEXERIL) 10 MG tablet Take 1 tablet (10 mg total) by mouth every 8 (eight) hours as needed for muscle spasms. 30 tablet 1 12/10/2022   ondansetron (ZOFRAN) 8 MG tablet Take 1 tablet (8 mg total) by mouth every 8 (eight) hours as needed for nausea or vomiting. Can take miralax in apple juice if needed for constipation. 20 tablet 0 12/10/2022 at 1600   Doxylamine-Pyridoxine ER (BONJESTA) 20-20 MG TBCR Take 1 tablet by mouth at bedtime. 60 tablet 3    gabapentin (NEURONTIN) 300 MG capsule Take 1 capsule (300 mg total) by mouth 3 (three) times daily as needed. 90 capsule 2    levalbuterol (XOPENEX HFA) 45 MCG/ACT inhaler Inhale 1-2 puffs into the lungs every  4 (four) hours as needed for wheezing. 1 each 12    oxycodone (OXY-IR) 5 MG capsule Take 5 mg by mouth every 4 (four) hours as needed. (Patient not taking: Reported on 12/10/2022)      Prenatal Vit-Fe Fumarate-FA (MULTIVITAMIN-PRENATAL) 27-0.8 MG TABS tablet Take 1 tablet by mouth daily at 12 noon. 30 tablet 0    Simethicone 80 MG TABS Take 1 tablet (80 mg total) by mouth 2 (two) times daily as needed. 30 tablet 0     Review of Systems  Constitutional:  Positive for appetite change.  HENT: Negative.    Eyes: Negative.   Respiratory: Negative.    Cardiovascular: Negative.   Gastrointestinal:  Positive for nausea and vomiting.  Endocrine: Negative.   Genitourinary:  Positive for pelvic pain.  Musculoskeletal: Negative.   Skin: Negative.   Allergic/Immunologic: Negative.   Neurological:  Positive for headaches.  Hematological: Negative.   Psychiatric/Behavioral: Negative.     Physical Exam   Blood pressure 125/82, pulse 96,  temperature 98.6 F (37 C), temperature source Oral, resp. rate 18, height 5\' 2"  (1.575 m), weight 69.9 kg, last menstrual period 08/18/2022, SpO2 99%.  Physical Exam Vitals and nursing note reviewed.  Constitutional:      Appearance: Normal appearance. She is normal weight.  HENT:     Head: Normocephalic and atraumatic.  Cardiovascular:     Rate and Rhythm: Normal rate.  Pulmonary:     Effort: Pulmonary effort is normal.  Abdominal:     Palpations: Abdomen is soft.  Genitourinary:    Comments: deferred Musculoskeletal:        General: Normal range of motion.  Skin:    General: Skin is warm and dry.  Neurological:     Mental Status: She is alert and oriented to person, place, and time.  Psychiatric:        Mood and Affect: Mood normal.        Behavior: Behavior normal.        Thought Content: Thought content normal.        Judgment: Judgment normal.    FHTs by doppler: 159 bpm  MAU Course  Procedures  MDM CCUA IVFs: Phenergan 25 mg in LR 1000 ml @ 999 ml/hr  Zofran 8 mg IVPB   PO Challenge   Results for orders placed or performed during the hospital encounter of 12/10/22 (from the past 24 hour(s))  Urinalysis, Routine w reflex microscopic -Urine, Clean Catch     Status: Abnormal   Collection Time: 12/10/22  6:15 PM  Result Value Ref Range   Color, Urine YELLOW YELLOW   APPearance CLOUDY (A) CLEAR   Specific Gravity, Urine 1.024 1.005 - 1.030   pH 5.0 5.0 - 8.0   Glucose, UA NEGATIVE NEGATIVE mg/dL   Hgb urine dipstick NEGATIVE NEGATIVE   Bilirubin Urine NEGATIVE NEGATIVE   Ketones, ur NEGATIVE NEGATIVE mg/dL   Protein, ur NEGATIVE NEGATIVE mg/dL   Nitrite NEGATIVE NEGATIVE   Leukocytes,Ua LARGE (A) NEGATIVE   RBC / HPF 0-5 0 - 5 RBC/hpf   WBC, UA 6-10 0 - 5 WBC/hpf   Bacteria, UA FEW (A) NONE SEEN   Squamous Epithelial / HPF 21-50 0 - 5 /HPF   Mucus PRESENT     Report given to and care assumed by M. Wilder, CNM @ [address] CNM 12/10/2022,  8:03 PM  Assessment and Plan

## 2022-12-10 NOTE — MAU Note (Signed)
Heather Malone is a 33 y.o. at [redacted]w[redacted]d here in MAU reporting: she's been unable to keep anything down, including solids and liquids.  Reports also has a HA and lower abdominal cramping.  Reports she took Tylenol between 1200 & 1300 and prescribed med between 1500& 1600, didn't keep either down.  Denies VB or LOF.   LMP: NA Onset of complaint: 2 weeks Pain score: 8 Vitals:   12/10/22 1745  BP: 125/82  Pulse: 96  Resp: 18  Temp: 98.6 F (37 C)  SpO2: 99%     FHT:159 bpm Lab orders placed from triage:   UA

## 2022-12-10 NOTE — MAU Provider Note (Incomplete Revision)
History     CSN: [id number]  Arrival date and time: 12/10/22 1725   Event Date/Time   First Provider Initiated Contact with Patient 12/10/22 2003      Chief Complaint  Patient presents with   Abdominal Pain   Headache   Nausea   Emesis   HPI Ms. Corona MAKYNZEE CURLES is a 32 y.o. year old G30P1102 female at [redacted]w[redacted]d weeks gestation who presents to MAU reporting unable to keep anything down for 2 weeks. She report sH/A, lower abdominal cramping. She took Tylenol, Excedrin Tension Headache, and Flexeril without relief. She was seen in [facility] today and was told that someone would call her to get her in the infusion center for IVFs. She was also advised to come to MAU, if no one called her about the infusion appointment. She receives [facility] with [facility]; next appt is 01/07/2023.   OB History     Gravida  3   Para  2   Term  1   Preterm  1   AB  0   Living  2      SAB  0   IAB  0   Ectopic  0   Multiple  0   Live Births  2           Past Medical History:  Diagnosis Date   Anemia    Anxiety    Asthma    albuterol prn   Headache    migraines   Hx of trichomoniasis    Hx of varicella    Neck injury, subsequent encounter 07/26/2016   Placental abruption in third trimester 09/04/2014   Pre-eclampsia    Subchorionic hemorrhage in third trimester 07/26/2014   Velamentous insertion of umbilical cord     Past Surgical History:  Procedure Laterality Date   TONSILLECTOMY      Family History  Problem Relation Age of Onset   Hypertension Mother    Hypertension Maternal Grandmother    Heart attack Maternal Grandmother    Hypertension Maternal Grandfather    Cancer Maternal Grandfather    Diabetes Paternal Grandmother     Social History   Tobacco Use   Smoking status: Former    Types: Cigars   Smokeless tobacco: Never  Vaping Use   Vaping status: Never Used  Substance Use Topics   Alcohol use: Not Currently    Comment: weekends   Drug use: Not Currently     Types: Marijuana    Comment: none with + UPT    Allergies: No Known Allergies  Medications Prior to Admission  Medication Sig Dispense Refill Last Dose   Butalbital-APAP-Caffeine 50-325-40 MG capsule Take 1-2 capsules by mouth every 6 (six) hours as needed for headache. 30 capsule 3 12/10/2022   cyclobenzaprine (FLEXERIL) 10 MG tablet Take 1 tablet (10 mg total) by mouth every 8 (eight) hours as needed for muscle spasms. 30 tablet 1 12/10/2022   ondansetron (ZOFRAN) 8 MG tablet Take 1 tablet (8 mg total) by mouth every 8 (eight) hours as needed for nausea or vomiting. Can take miralax in apple juice if needed for constipation. 20 tablet 0 12/10/2022 at 1600   Doxylamine-Pyridoxine ER (BONJESTA) 20-20 MG TBCR Take 1 tablet by mouth at bedtime. 60 tablet 3    gabapentin (NEURONTIN) 300 MG capsule Take 1 capsule (300 mg total) by mouth 3 (three) times daily as needed. 90 capsule 2    levalbuterol (XOPENEX HFA) 45 MCG/ACT inhaler Inhale 1-2 puffs into the lungs every  4 (four) hours as needed for wheezing. 1 each 12    oxycodone (OXY-IR) 5 MG capsule Take 5 mg by mouth every 4 (four) hours as needed. (Patient not taking: Reported on 12/10/2022)      Prenatal Vit-Fe Fumarate-FA (MULTIVITAMIN-PRENATAL) 27-0.8 MG TABS tablet Take 1 tablet by mouth daily at 12 noon. 30 tablet 0    Simethicone 80 MG TABS Take 1 tablet (80 mg total) by mouth 2 (two) times daily as needed. 30 tablet 0     Review of Systems  Constitutional:  Positive for appetite change.  HENT: Negative.    Eyes: Negative.   Respiratory: Negative.    Cardiovascular: Negative.   Gastrointestinal:  Positive for nausea and vomiting.  Endocrine: Negative.   Genitourinary:  Positive for pelvic pain.  Musculoskeletal: Negative.   Skin: Negative.   Allergic/Immunologic: Negative.   Neurological:  Positive for headaches.  Hematological: Negative.   Psychiatric/Behavioral: Negative.     Physical Exam   Blood pressure 125/82, pulse 96,  temperature 98.6 F (37 C), temperature source Oral, resp. rate 18, height 5\' 2"  (1.575 m), weight 69.9 kg, last menstrual period 08/18/2022, SpO2 99%.  Physical Exam Vitals and nursing note reviewed.  Constitutional:      Appearance: Normal appearance. She is normal weight.  HENT:     Head: Normocephalic and atraumatic.  Cardiovascular:     Rate and Rhythm: Normal rate.  Pulmonary:     Effort: Pulmonary effort is normal.  Abdominal:     Palpations: Abdomen is soft.  Genitourinary:    Comments: deferred Musculoskeletal:        General: Normal range of motion.  Skin:    General: Skin is warm and dry.  Neurological:     Mental Status: She is alert and oriented to person, place, and time.  Psychiatric:        Mood and Affect: Mood normal.        Behavior: Behavior normal.        Thought Content: Thought content normal.        Judgment: Judgment normal.    FHTs by doppler: 159 bpm  MAU Course  Procedures  MDM CCUA IVFs: Phenergan 25 mg in LR 1000 ml @ 999 ml/hr  Zofran 8 mg IVPB   PO Challenge   Results for orders placed or performed during the hospital encounter of 12/10/22 (from the past 24 hour(s))  Urinalysis, Routine w reflex microscopic -Urine, Clean Catch     Status: Abnormal   Collection Time: 12/10/22  6:15 PM  Result Value Ref Range   Color, Urine YELLOW YELLOW   APPearance CLOUDY (A) CLEAR   Specific Gravity, Urine 1.024 1.005 - 1.030   pH 5.0 5.0 - 8.0   Glucose, UA NEGATIVE NEGATIVE mg/dL   Hgb urine dipstick NEGATIVE NEGATIVE   Bilirubin Urine NEGATIVE NEGATIVE   Ketones, ur NEGATIVE NEGATIVE mg/dL   Protein, ur NEGATIVE NEGATIVE mg/dL   Nitrite NEGATIVE NEGATIVE   Leukocytes,Ua LARGE (A) NEGATIVE   RBC / HPF 0-5 0 - 5 RBC/hpf   WBC, UA 6-10 0 - 5 WBC/hpf   Bacteria, UA FEW (A) NONE SEEN   Squamous Epithelial / HPF 21-50 0 - 5 /HPF   Mucus PRESENT     Report given to and care assumed by M. Mayford Knife, CNM @ [address] CNM 12/10/2022,  8:03 PM   Assumed care Given Excedrin Tension without much relief Still nauseated after treatment Never got any Reglan, so will  add Reglan and Magnesium sulfate 2gm for headache.   May need referral to HA specialist  Assessment and Plan

## 2022-12-11 DIAGNOSIS — Z3A15 15 weeks gestation of pregnancy: Secondary | ICD-10-CM

## 2022-12-11 DIAGNOSIS — M549 Dorsalgia, unspecified: Secondary | ICD-10-CM

## 2022-12-11 DIAGNOSIS — R519 Headache, unspecified: Secondary | ICD-10-CM | POA: Diagnosis not present

## 2022-12-11 DIAGNOSIS — R1110 Vomiting, unspecified: Secondary | ICD-10-CM | POA: Diagnosis not present

## 2022-12-11 DIAGNOSIS — O99891 Other specified diseases and conditions complicating pregnancy: Secondary | ICD-10-CM

## 2022-12-11 DIAGNOSIS — R102 Pelvic and perineal pain: Secondary | ICD-10-CM

## 2022-12-11 DIAGNOSIS — O26892 Other specified pregnancy related conditions, second trimester: Secondary | ICD-10-CM

## 2022-12-16 ENCOUNTER — Ambulatory Visit: Payer: Medicaid Other

## 2022-12-16 VITALS — BP 122/82 | HR 84 | Temp 98.3°F | Resp 16 | Ht 62.0 in | Wt 155.6 lb

## 2022-12-16 DIAGNOSIS — R1110 Vomiting, unspecified: Secondary | ICD-10-CM | POA: Diagnosis not present

## 2022-12-16 MED ORDER — INFUVITE ADULT IV SOLN
Freq: Once | INTRAVENOUS | Status: AC
Start: 2022-12-16 — End: 2022-12-16
  Filled 2022-12-16: qty 10

## 2022-12-16 MED ORDER — SODIUM CHLORIDE 0.9 % IV SOLN
25.0000 mg | Freq: Once | INTRAVENOUS | Status: AC
  Administered 2022-12-16: 25 mg via INTRAVENOUS
  Filled 2022-12-16: qty 1

## 2022-12-16 MED ORDER — DEXAMETHASONE SODIUM PHOSPHATE 10 MG/ML IJ SOLN
10.0000 mg | Freq: Once | INTRAMUSCULAR | Status: AC
  Administered 2022-12-16: 10 mg via INTRAVENOUS
  Filled 2022-12-16: qty 1

## 2022-12-16 NOTE — Progress Notes (Signed)
Diagnosis: Dehydration  Provider:  Chilton Greathouse MD  Procedure: IV Infusion  IV Type: Peripheral, IV Location: R Antecubital  MVI, Dose: 1000 ml  Infusion Start Time: 1314  Infusion Stop Time: 1524   Promethazine 25 mg with , Normal Saline, Dose: 1000 ml  Infusion Start Time: 1527  Infusion Stop Time: 1633    Procedure: IV Push  Dexamethasone, Dose: 10mg    Post Infusion IV Care: Peripheral IV Discontinued  Discharge: Condition: Good, Destination: Home . AVS Declined  Performed by:  Adriana Mccallum, RN

## 2022-12-22 ENCOUNTER — Telehealth: Payer: Self-pay | Admitting: Family Medicine

## 2022-12-22 NOTE — Telephone Encounter (Signed)
Patient called needing a new referral sent from Edd Arbour to the Infusion center for weekly infusions.

## 2022-12-23 ENCOUNTER — Encounter: Payer: Self-pay | Admitting: Certified Nurse Midwife

## 2022-12-23 NOTE — Addendum Note (Signed)
Addended by: Edd Arbour on: 12/23/2022 02:33 PM   Modules accepted: Orders

## 2022-12-30 NOTE — Progress Notes (Unsigned)
Blood Pressure Check Visit  Heather Malone is here for blood pressure check at [redacted]w[redacted]d GA. BP today is 105/71. Patient denies any dizzness; patient does report SOB, changes in vision (including seeing black spots and blurry vision), and peripheral edema in hands mainly.  Patient has hx of pre-e and recently had elevated BP at d/c in MAU on 10/01/22 (BP was 118/95). Currently not taking anything for BP.   Reviewed information above with Waveland Bing, MD; per provider, patient is to continue monitoring BP weekly. No Rx sent in for BP meds. Patient to be re-evaluated at next prenatal visit on 01/07/23.   Meryl Crutch, RN 12/30/2022  5:27 PM

## 2022-12-31 ENCOUNTER — Other Ambulatory Visit: Payer: Self-pay

## 2022-12-31 ENCOUNTER — Ambulatory Visit: Payer: Medicaid Other

## 2022-12-31 VITALS — BP 105/71 | HR 83 | Wt 158.2 lb

## 2022-12-31 DIAGNOSIS — R1110 Vomiting, unspecified: Secondary | ICD-10-CM

## 2022-12-31 DIAGNOSIS — O0992 Supervision of high risk pregnancy, unspecified, second trimester: Secondary | ICD-10-CM

## 2022-12-31 DIAGNOSIS — Z8759 Personal history of other complications of pregnancy, childbirth and the puerperium: Secondary | ICD-10-CM

## 2022-12-31 DIAGNOSIS — Z3A17 17 weeks gestation of pregnancy: Secondary | ICD-10-CM

## 2023-01-02 ENCOUNTER — Ambulatory Visit (INDEPENDENT_AMBULATORY_CARE_PROVIDER_SITE_OTHER): Payer: Medicaid Other | Admitting: Family Medicine

## 2023-01-02 ENCOUNTER — Other Ambulatory Visit: Payer: Self-pay

## 2023-01-02 VITALS — BP 120/81 | HR 84 | Wt 158.6 lb

## 2023-01-02 DIAGNOSIS — Z3A18 18 weeks gestation of pregnancy: Secondary | ICD-10-CM

## 2023-01-02 DIAGNOSIS — O26892 Other specified pregnancy related conditions, second trimester: Secondary | ICD-10-CM

## 2023-01-02 DIAGNOSIS — O0992 Supervision of high risk pregnancy, unspecified, second trimester: Secondary | ICD-10-CM

## 2023-01-02 DIAGNOSIS — Z8759 Personal history of other complications of pregnancy, childbirth and the puerperium: Secondary | ICD-10-CM

## 2023-01-02 DIAGNOSIS — R1030 Lower abdominal pain, unspecified: Secondary | ICD-10-CM

## 2023-01-02 DIAGNOSIS — O09292 Supervision of pregnancy with other poor reproductive or obstetric history, second trimester: Secondary | ICD-10-CM

## 2023-01-02 NOTE — Progress Notes (Signed)
   PRENATAL VISIT NOTE  Subjective:  Heather Malone is a 32 y.o. [id number] at [redacted]w[redacted]d being seen today for ongoing prenatal care.  She is currently monitored for the following issues for this low-risk pregnancy and has Subchorionic hematoma; Chronic headaches; Alpha thalassemia silent carrier; Elevated blood pressure complicating pregnancy in first trimester, antepartum; Supervision of high-risk pregnancy; History of severe pre-eclampsia; and Hyperemesis on their problem list.  Patient reports backache and abdominal pain. Felt like contractions. Not relieved by flexeril or gabapentin or tylenol or heat. Denies bleeding or LOF.  Contractions: Irritability. Vag. Bleeding: None.  Movement: Present. Denies leaking of fluid.   The following portions of the patient's history were reviewed and updated as appropriate: allergies, current medications, past family history, past medical history, past social history, past surgical history and problem list.   Objective:   Vitals:   01/02/23 1047  BP: 120/81  Pulse: 84  Weight: 158 lb 9.6 oz (71.9 kg)    Fetal Status: Fetal Heart Rate (bpm): 151   Movement: Present     General:  Alert, oriented and cooperative. Patient is in no acute distress.  Skin: Skin is warm and dry. No rash noted.   Cardiovascular: Normal heart rate noted  Respiratory: Normal respiratory effort, no problems with respiration noted  Abdomen: Soft, gravid, appropriate for gestational age.  Pain/Pressure: Present     Pelvic: Cervical exam performed in the presence of a chaperone Dilation: Closed Effacement (%): 10 Station: Ballotable  Extremities: Normal range of motion.  Edema: Trace (hands; left leg swelling yesterday, leg also went numb)  Mental Status: Normal mood and affect. Normal behavior. Normal judgment and thought content.   Assessment and Plan:  Pregnancy: G3P1102 at [redacted]w[redacted]d 1. Supervision of high risk pregnancy in second trimester Continue routine prenatal care.  2.  History of severe pre-eclampsia BP is ok today  3. [redacted] weeks gestation of pregnancy   4. Lower abdominal pain No evidence of PTL Discussed with her PTL precautions Use tylenol, heat, fexeril and added maternity belt Will send to pelvic PT - Ambulatory referral to Physical Therapy  Preterm labor symptoms and general obstetric precautions including but not limited to vaginal bleeding, contractions, leaking of fluid and fetal movement were reviewed in detail with the patient. Please refer to After Visit Summary for other counseling recommendations.   Return in 4 weeks (on 01/30/2023).  Future Appointments  Date Time Provider Department Center  01/06/2023  1:45 PM [facility]IR 2 [facility] None  01/07/2023 10:55 AM Bernerd Limbo, CNM [facility]  01/08/2023  8:15 AM [facility] NURSE [facility]  01/08/2023  8:30 AM [facility] US3 [facility]  01/13/2023  2:30 PM [facility]-CHAIR 5 [facility] None  01/22/2023  1:15 PM [facility]-CHAIR 5 [facility] None  01/29/2023  1:15 PM [facility]-CHAIR 3 [facility] None  02/05/2023  1:15 PM [facility]CHAIR 5 [facility] None  02/12/2023  1:15 PM [facility]CHAIR 4 [facility] None    Reva Bores, MD

## 2023-01-02 NOTE — Telephone Encounter (Signed)
Called pt in response to MyChart message. Pt states she is planning to present to MAU this morning. Pt reports swelling and numbness in L leg present yesterday has now resolved. Reports lower abdominal pain that feels sharp like needles. Denies any other symptoms. Has trialed Flexeril, Tylenol, and gabapentin. Also tried heating pad. Available office appt with Shawnie Pons MD. Pt scheduled.

## 2023-01-06 ENCOUNTER — Ambulatory Visit: Payer: Medicaid Other

## 2023-01-06 ENCOUNTER — Telehealth: Payer: Self-pay | Admitting: Family Medicine

## 2023-01-06 VITALS — BP 114/82 | HR 80 | Temp 98.4°F | Resp 18 | Ht 62.0 in | Wt 159.0 lb

## 2023-01-06 DIAGNOSIS — R1110 Vomiting, unspecified: Secondary | ICD-10-CM | POA: Diagnosis not present

## 2023-01-06 MED ORDER — INFUVITE ADULT IV SOLN
INTRAVENOUS | Status: DC
  Filled 2023-01-06: qty 10

## 2023-01-06 MED ORDER — SODIUM CHLORIDE 0.9 % IV SOLN
25.0000 mg | INTRAVENOUS | Status: DC
  Administered 2023-01-06: 25 mg via INTRAVENOUS
  Filled 2023-01-06: qty 1

## 2023-01-06 NOTE — Progress Notes (Signed)
Diagnosis: Hyperemesis  Provider:  Chilton Greathouse MD  Procedure: IV Infusion  IV Type: Peripheral, IV Location: R Antecubital  Promethazine 25 mg with Normal Saline , Dose: 1000 ml  Infusion Start Time: 1352  Infusion Stop Time: 1458  Banana Bag, Dose: 1000 ml  Infusion Start Time: 1459  Infusion Stop Time: 1607  Post Infusion IV Care: Peripheral IV Discontinued  Discharge: Condition: Good, Destination: Home . AVS Declined  Performed by:  Garnette Czech, RN

## 2023-01-06 NOTE — Telephone Encounter (Signed)
I called patient back and she reported she thought the nurse she was for her BP check told her she needed labs. I reviewed  her chart with Cinda Quest, RN who did her BP visit including nurse visit and provider visit and no labwork ordered. I explained to her that the nurse may have meant that she may need blood work or urine tests if her BP was elevated to check for pre-eclampsia but since her BP was normal for both visits, that the provider did not order any blood work. I explained her next planned blood work is 28 weeks but sooner if her condition changes and she has elevated BP. She voices understanding. Nancy Fetter

## 2023-01-06 NOTE — Telephone Encounter (Signed)
Patient says she was seen for an OB appointment on 12/6 and she said she was told by a nurse that there was blood work that needed to be done at her appt that was for 12/11 that was cancelled by [place]. I told her that an OB appointment was not needed on 12/11 since she was seen last Friday but I would send a message to the nurse to see what kind of blood work needs to be done so that she can be put on the lab schedule. Patient is currently [redacted]w[redacted]d.

## 2023-01-07 ENCOUNTER — Encounter: Payer: Medicaid Other | Admitting: Certified Nurse Midwife

## 2023-01-08 ENCOUNTER — Other Ambulatory Visit: Payer: Self-pay | Admitting: *Deleted

## 2023-01-08 ENCOUNTER — Other Ambulatory Visit: Payer: Self-pay

## 2023-01-08 ENCOUNTER — Ambulatory Visit: Payer: Medicaid Other | Attending: Certified Nurse Midwife

## 2023-01-08 ENCOUNTER — Ambulatory Visit: Payer: Medicaid Other

## 2023-01-08 ENCOUNTER — Encounter: Payer: Self-pay | Admitting: *Deleted

## 2023-01-08 VITALS — BP 133/82 | HR 79

## 2023-01-08 DIAGNOSIS — O0992 Supervision of high risk pregnancy, unspecified, second trimester: Secondary | ICD-10-CM | POA: Insufficient documentation

## 2023-01-08 DIAGNOSIS — R1110 Vomiting, unspecified: Secondary | ICD-10-CM | POA: Insufficient documentation

## 2023-01-08 DIAGNOSIS — O09212 Supervision of pregnancy with history of pre-term labor, second trimester: Secondary | ICD-10-CM

## 2023-01-08 DIAGNOSIS — O09292 Supervision of pregnancy with other poor reproductive or obstetric history, second trimester: Secondary | ICD-10-CM | POA: Diagnosis not present

## 2023-01-08 DIAGNOSIS — Z8759 Personal history of other complications of pregnancy, childbirth and the puerperium: Secondary | ICD-10-CM | POA: Diagnosis present

## 2023-01-08 DIAGNOSIS — O210 Mild hyperemesis gravidarum: Secondary | ICD-10-CM | POA: Diagnosis not present

## 2023-01-08 DIAGNOSIS — O0990 Supervision of high risk pregnancy, unspecified, unspecified trimester: Secondary | ICD-10-CM | POA: Insufficient documentation

## 2023-01-08 DIAGNOSIS — Z3A19 19 weeks gestation of pregnancy: Secondary | ICD-10-CM | POA: Diagnosis not present

## 2023-01-08 DIAGNOSIS — Z362 Encounter for other antenatal screening follow-up: Secondary | ICD-10-CM

## 2023-01-08 NOTE — Therapy (Signed)
OUTPATIENT PHYSICAL THERAPY FEMALE PELVIC EVALUATION   Patient Name: Heather Malone MRN: [id number] DOB:1990-10-15, 32 y.o., female Today's Date: 01/09/2023  END OF SESSION:  PT End of Session - 01/09/23 0921     Visit Number 1    PT Start Time 0845    PT Stop Time 0930    PT Time Calculation (min) 45 min    Activity Tolerance Patient tolerated treatment well    Behavior During Therapy [facility] for tasks assessed/performed             Past Medical History:  Diagnosis Date   Anemia    Anxiety    Asthma    albuterol prn   Headache    migraines   Hx of trichomoniasis    Hx of varicella    Neck injury, subsequent encounter 07/26/2016   Placental abruption in third trimester 09/04/2014   Pre-eclampsia    Subchorionic hemorrhage in third trimester 07/26/2014   Velamentous insertion of umbilical cord    Past Surgical History:  Procedure Laterality Date   TONSILLECTOMY     Patient Active Problem List   Diagnosis Date Noted   Hyperemesis 12/10/2022   Supervision of high-risk pregnancy 10/29/2022   History of severe pre-eclampsia 10/29/2022   Elevated blood pressure complicating pregnancy in first trimester, antepartum 10/01/2022   Alpha thalassemia silent carrier 08/02/2020   Chronic headaches 04/12/2020   Subchorionic hematoma 07/26/2014    PCP: Patient, No Pcp Per PCP - General  REFERRING PROVIDER: Reva Bores, MD Ref Provider  REFERRING DIAG: R10.30 (ICD-10-CM) - Lower abdominal pain    THERAPY DIAG:  Muscle weakness (generalized) - Plan: PT plan of care cert/re-cert  Rationale for Evaluation and Treatment: Rehabilitation  ONSET DATE: 10/2022  SUBJECTIVE:                                                                                                                                                                                           SUBJECTIVE STATEMENT: Pt reports that she has some low back pain and low abdominal pain. She is [redacted] weeks  pregnant.  This is her 3rd child- has a son 47 yo. and daughter 65 yo. Not planning on any more children. Pregnancy has been a rollo coaster, she has been nauseated, taking infusions, baby feels like she is going to come out.  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 Yes NPRS scale: 7-8/10 Pain location:  abdomen- like lightning bolts, low back as well  Pain type: burning  Pain description: intermittent and constant   Aggravating factors: sitting too long, walking too much Relieving factors: ice packs on backs, bending down  PRECAUTIONS: None  RED FLAGS: None   WEIGHT BEARING RESTRICTIONS: No  FALLS:  Has patient fallen in last 6 months? No  LIVING ENVIRONMENT: Lives with: lives with their family Lives in: House/apartment Stairs: No Has following equipment at home: None  OCCUPATION: desk job, benefits specialist  PLOF: Independent  PATIENT GOALS: relieve the pain  PERTINENT HISTORY:  Nothing relevant Sexual abuse: No  BOWEL MOVEMENT:n/a   URINATION: no issues  INTERCOURSE: no issues  PREGNANCY: Vaginal deliveries 2 Tearing No C-section deliveries 0 Currently pregnant Yes: 19 weeks  PROLAPSE: None   OBJECTIVE:  Note: Objective measures were completed at Evaluation unless otherwise noted.     COGNITION: Overall cognitive status: Within functional limits for tasks assessed     SENSATION: Light touch: Appears intact Proprioception: Appears intact  MUSCLE LENGTH: Hamstrings: Right 80 deg; Left 80 deg   LUMBAR SPECIAL TESTS:  Stork standing: Positive for weakness bilat    GAIT: Comments: antalgic, wide stance  POSTURE: rounded shoulders, forward head, increased lumbar lordosis, and anterior pelvic tilt  PELVIC  ALIGNMENT: seems even  LUMBARAROM/PROM:  A/PROM A/PROM  eval  Flexion 50% difficult  Extension 50% difficult   LOWER EXTREMITY ROM: some limitations throughout d/t stiffness and pain  LOWER EXTREMITY MMT: at least 4/5 grossly overall    PALPATION:   General  abdomen- low tone                External Perineal Exam to be assessed as needed                             Internal Pelvic Floor to be assessed as needed  Patient confirms identification and approves PT to assess internal pelvic floor and treatment No  PELVIC MMT:   MMT eval  Vaginal   Internal Anal Sphincter   External Anal Sphincter   Puborectalis   Diastasis Recti   (Blank rows = not tested)        TONE: Low tone abdomen High tone and trigger points lumbar paraspinals Pelvic girdle external pressure relieved pain ( serola belt trial, manual pressure)  PROLAPSE: To be assessed if needed  TODAY'S TREATMENT:                                                                                                                              DATE: 01/09/2023  EVAL se below  Neuro reed- horizontal abduction with blue thera band with transverse abdominis breath 20 reps  Rowing blue thera band with transverse abdominis breath 20 reps                     Shoulder extension blue thera band 20 reps                     Kettle bell squat with #10  with transverse abdominis breath 10 reps                     All exercises with Serola belt ( medium with extender) for pain relief and external support     PATIENT EDUCATION:  Education details/ There act: Serola belt trial, HEP, relevant anatomy, expectations of PT Person educated: Patient Education method: Explanation, Demonstration, and Handouts Education comprehension: verbalized understanding and needs further education  HOME EXERCISE PROGRAM: [id number]  ASSESSMENT:  CLINICAL IMPRESSION: Patient is a 32 y.o. F who was seen today for physical  therapy evaluation and treatment for low back pain and abdominal pain in pregnancy. She demonstrates poor abdominal tone and high lumbar paraspinal tone and gripping. She likely got weak d/t to hyperemesis in pregnancy for 3 months, this has improved with infusions in the last 4 weeks. She did well with education, trial of external support belt, was able to learn about pressure management using her breath vs breath holding with exercises with resistance and activate her abdominals for improved core coordination. She will benefit from continued PT to relieve pain and improve quality of life.   OBJECTIVE IMPAIRMENTS: decreased activity tolerance, decreased knowledge of condition, difficulty walking, decreased strength, increased muscle spasms, impaired flexibility, impaired tone, improper body mechanics, and pain.   ACTIVITY LIMITATIONS: carrying, lifting, bending, standing, squatting, sleeping, stairs, transfers, bed mobility, locomotion level, and caring for others  PARTICIPATION LIMITATIONS: meal prep, cleaning, laundry, shopping, community activity, occupation, and yard work  PERSONAL FACTORS: Fitness and Time since onset of injury/illness/exacerbation are also affecting patient's functional outcome.   REHAB POTENTIAL: Good  CLINICAL DECISION MAKING: Stable/uncomplicated  EVALUATION COMPLEXITY: Low   GOALS: Goals reviewed with patient? Yes  SHORT TERM GOALS: Target date: 02/06/2023    Pt will be I with her HEP and demonstrate her exercises correctly Baseline: Goal status: INITIAL  2.  Pt will report max 4/10 pain with walking for 3o mins Baseline:  Goal status: INITIAL  3.  Pt will be able to lift her 2 yo child with good pressure management at least 50% of the time Baseline:  Goal status: INITIAL   LONG TERM GOALS: Target date: 04/03/2023    Pt will report max 2/10 low back and abdominal pain with 1 hour of walking  Baseline:  Goal status: INITIAL  2.  Pt will be I with  advanced HEP and dem all exercises correctly Baseline:  Goal status: INITIAL  3.  Pt will be able to sleep at least 5 consecutive hours at night without increased pain Baseline:  Goal status: INITIAL   PLAN:  PT FREQUENCY: 1-2x/week  PT DURATION: 12 weeks  PLANNED INTERVENTIONS: 97110-Therapeutic exercises, 97530- Therapeutic activity, 97112- Neuromuscular re-education, 97535- Self Care, [id number]- Manual therapy, [phone]- Electrical stimulation (manual), Patient/Family education, Taping, Dry Needling, Joint mobilization, Joint manipulation, Spinal manipulation, Spinal mobilization, Moist heat, and Biofeedback  PLAN FOR NEXT SESSION: continue exercises, teach pt kinesio taping for abdomen  Demichael Traum, PT 01/09/23 10:07 AM

## 2023-01-09 ENCOUNTER — Encounter: Payer: Self-pay | Admitting: Physical Therapy

## 2023-01-09 ENCOUNTER — Ambulatory Visit: Payer: Medicaid Other | Attending: Family Medicine | Admitting: Physical Therapy

## 2023-01-09 DIAGNOSIS — R1030 Lower abdominal pain, unspecified: Secondary | ICD-10-CM | POA: Diagnosis not present

## 2023-01-09 DIAGNOSIS — M6281 Muscle weakness (generalized): Secondary | ICD-10-CM | POA: Diagnosis present

## 2023-01-13 ENCOUNTER — Ambulatory Visit: Payer: Medicaid Other

## 2023-01-13 VITALS — BP 112/77 | HR 84 | Temp 98.7°F | Resp 18 | Ht 62.0 in | Wt 163.4 lb

## 2023-01-13 DIAGNOSIS — R1110 Vomiting, unspecified: Secondary | ICD-10-CM

## 2023-01-13 MED ORDER — INFUVITE ADULT IV SOLN
INTRAVENOUS | Status: DC
  Filled 2023-01-13: qty 10

## 2023-01-13 MED ORDER — SODIUM CHLORIDE 0.9 % IV SOLN
25.0000 mg | INTRAVENOUS | Status: DC
  Administered 2023-01-13: 25 mg via INTRAVENOUS
  Filled 2023-01-13: qty 1

## 2023-01-13 NOTE — Progress Notes (Signed)
Diagnosis: Hyperemesis  Provider:  Chilton Greathouse MD  Procedure: IV Infusion  IV Type: Peripheral, IV Location: L Antecubital  MVI in Lactated Ringers IVF  Dose: 1000 ml  Infusion Start Time: 1444 pm  Infusion Stop Time: 1605  Post Infusion IV Care: Patient declined observation and Peripheral IV Discontinued  Discharge: Condition: Good, Destination: Home . AVS Declined  Performed by:  Forrest Moron, RN     Normal Saiine IVF with Phenergan 25 mg, Dose: 1000 ml  Infusion Start Time: 1607  Infusion Stop Time: 1723

## 2023-01-15 ENCOUNTER — Ambulatory Visit: Payer: Medicaid Other | Admitting: Physical Therapy

## 2023-01-15 ENCOUNTER — Encounter: Payer: Self-pay | Admitting: Physical Therapy

## 2023-01-15 DIAGNOSIS — M6281 Muscle weakness (generalized): Secondary | ICD-10-CM

## 2023-01-15 DIAGNOSIS — R1030 Lower abdominal pain, unspecified: Secondary | ICD-10-CM | POA: Diagnosis not present

## 2023-01-15 NOTE — Therapy (Signed)
OUTPATIENT PHYSICAL THERAPY FEMALE PELVIC EVALUATION   Patient Name: Heather Malone MRN: [id number] DOB:[birth year], 32 y.o., female Today's Date: 01/15/2023  END OF SESSION:  PT End of Session - 01/15/23 1210     Visit Number 2    PT Start Time 1153    PT Stop Time 1231    PT Time Calculation (min) 38 min    Activity Tolerance Patient tolerated treatment well    Behavior During Therapy WFL for tasks assessed/performed              Past Medical History:  Diagnosis Date   Anemia    Anxiety    Asthma    albuterol prn   Headache    migraines   Hx of trichomoniasis    Hx of varicella    Neck injury, subsequent encounter 07/26/2016   Placental abruption in third trimester 09/04/2014   Pre-eclampsia    Subchorionic hemorrhage in third trimester 07/26/2014   Velamentous insertion of umbilical cord    Past Surgical History:  Procedure Laterality Date   TONSILLECTOMY     Patient Active Problem List   Diagnosis Date Noted   Hyperemesis 12/10/2022   Supervision of high-risk pregnancy 10/29/2022   History of severe pre-eclampsia 10/29/2022   Elevated blood pressure complicating pregnancy in first trimester, antepartum 10/01/2022   Alpha thalassemia silent carrier 08/02/2020   Chronic headaches 04/12/2020   Subchorionic hematoma 07/26/2014    PCP: Patient, No Pcp Per PCP - General  REFERRING PROVIDER: Reva Bores, MD Ref Provider  REFERRING DIAG: R10.30 (ICD-10-CM) - Lower abdominal pain    THERAPY DIAG:  Muscle weakness (generalized)  Rationale for Evaluation and Treatment: Rehabilitation  ONSET DATE: 10/2022  SUBJECTIVE:                                                                                                                                                                                           SUBJECTIVE STATEMENT: Pt reports that she has a little pain today- RLQ, wearing her belt. Likes Serola better, will probably get that. She is [redacted]  weeks pregnant.  Fluid intake: Yes: able to keep something down    PAIN:  Are you having pain? Yes NPRS scale: 7-8/10 Pain location:  abdomen- like lightning bolts, low back as well  Pain type: burning  Pain description: intermittent and constant   Aggravating factors: sitting too long, walking too much Relieving factors: ice packs on backs, bending down  PRECAUTIONS: None  RED FLAGS: None   WEIGHT BEARING RESTRICTIONS: No  FALLS:  Has patient fallen in last 6 months? No  LIVING ENVIRONMENT: Lives with: lives  with their family Lives in: House/apartment Stairs: No Has following equipment at home: None  OCCUPATION: desk job, benefits specialist  PLOF: Independent  PATIENT GOALS: relieve the pain  PERTINENT HISTORY:  Nothing relevant Sexual abuse: No  BOWEL MOVEMENT:n/a   URINATION: no issues  INTERCOURSE: no issues  PREGNANCY: Vaginal deliveries 2 Tearing No C-section deliveries 0 Currently pregnant Yes: 19 weeks  PROLAPSE: None   OBJECTIVE:  Note: Objective measures were completed at Evaluation unless otherwise noted.     COGNITION: Overall cognitive status: Within functional limits for tasks assessed     SENSATION: Light touch: Appears intact Proprioception: Appears intact  MUSCLE LENGTH: Hamstrings: Right 80 deg; Left 80 deg   LUMBAR SPECIAL TESTS:  Stork standing: Positive for weakness bilat    GAIT: Comments: antalgic, wide stance  POSTURE: rounded shoulders, forward head, increased lumbar lordosis, and anterior pelvic tilt  PELVIC ALIGNMENT: seems even  LUMBARAROM/PROM:  A/PROM A/PROM  eval  Flexion 50% difficult  Extension 50% difficult   LOWER EXTREMITY ROM: some limitations throughout d/t stiffness and pain  LOWER EXTREMITY MMT: at least 4/5 grossly overall    PALPATION:   General  abdomen- low tone                External Perineal Exam to be assessed as needed                             Internal Pelvic  Floor to be assessed as needed  Patient confirms identification and approves PT to assess internal pelvic floor and treatment No  PELVIC MMT:   MMT eval  Vaginal   Internal Anal Sphincter   External Anal Sphincter   Puborectalis   Diastasis Recti   (Blank rows = not tested)        TONE: Low tone abdomen High tone and trigger points lumbar paraspinals Pelvic girdle external pressure relieved pain ( serola belt trial, manual pressure)  PROLAPSE: To be assessed if needed  TODAY'S TREATMENT:                                                                                                                              DATE: 01/15/2023    Neuro reed- hip adduction with ball with transverse abdominis breath  Horizontal abduction with blue thera band with transverse abdominis breath  Rowing blue thera band with transverse abdominis breath 20 reps                       HOME EXERCISE PROGRAM: [id number]  ASSESSMENT:  CLINICAL IMPRESSION: Pt with hypermobility, did well with her exercises to help with improved core coordination and tone. Pt feeling better overall, less nausea and more energy. Will likely get the Serola belt.    OBJECTIVE IMPAIRMENTS: decreased activity tolerance, decreased knowledge of condition, difficulty walking, decreased strength, increased muscle spasms, impaired flexibility, impaired tone, improper body  mechanics, and pain.   ACTIVITY LIMITATIONS: carrying, lifting, bending, standing, squatting, sleeping, stairs, transfers, bed mobility, locomotion level, and caring for others  PARTICIPATION LIMITATIONS: meal prep, cleaning, laundry, shopping, community activity, occupation, and yard work  PERSONAL FACTORS: Fitness and Time since onset of injury/illness/exacerbation are also affecting patient's functional outcome.   REHAB POTENTIAL: Good  CLINICAL DECISION MAKING: Stable/uncomplicated  EVALUATION COMPLEXITY: Low   GOALS: Goals reviewed with patient?  Yes  SHORT TERM GOALS: Target date: 02/06/2023    Pt will be I with her HEP and demonstrate her exercises correctly Baseline: Goal status: INITIAL  2.  Pt will report max 4/10 pain with walking for 3o mins Baseline:  Goal status: INITIAL  3.  Pt will be able to lift her 2 yo child with good pressure management at least 50% of the time Baseline:  Goal status: INITIAL   LONG TERM GOALS: Target date: 04/03/2023    Pt will report max 2/10 low back and abdominal pain with 1 hour of walking  Baseline:  Goal status: INITIAL  2.  Pt will be I with advanced HEP and dem all exercises correctly Baseline:  Goal status: INITIAL  3.  Pt will be able to sleep at least 5 consecutive hours at night without increased pain Baseline:  Goal status: INITIAL   PLAN:  PT FREQUENCY: 1-2x/week  PT DURATION: 12 weeks  PLANNED INTERVENTIONS: 97110-Therapeutic exercises, 97530- Therapeutic activity, 97112- Neuromuscular re-education, 97535- Self Care, 16109- Manual therapy, [phone]- Electrical stimulation (manual), Patient/Family education, Taping, Dry Needling, Joint mobilization, Joint manipulation, Spinal manipulation, Spinal mobilization, Moist heat, and Biofeedback  PLAN FOR NEXT SESSION: continue exercises, teach pt kinesio taping for abdomen  Andra Heslin, PT 01/15/23 12:21 PM

## 2023-01-20 ENCOUNTER — Other Ambulatory Visit: Payer: Self-pay

## 2023-01-20 ENCOUNTER — Inpatient Hospital Stay (HOSPITAL_COMMUNITY)
Admission: AD | Admit: 2023-01-20 | Discharge: 2023-01-20 | Disposition: A | Discharge status: Home / Self Care | Payer: Medicaid Other | Attending: Obstetrics and Gynecology | Admitting: Obstetrics and Gynecology

## 2023-01-20 ENCOUNTER — Encounter (HOSPITAL_COMMUNITY): Payer: Self-pay | Admitting: Obstetrics and Gynecology

## 2023-01-20 DIAGNOSIS — O23592 Infection of other part of genital tract in pregnancy, second trimester: Secondary | ICD-10-CM | POA: Insufficient documentation

## 2023-01-20 DIAGNOSIS — B9689 Other specified bacterial agents as the cause of diseases classified elsewhere: Secondary | ICD-10-CM | POA: Insufficient documentation

## 2023-01-20 DIAGNOSIS — O0992 Supervision of high risk pregnancy, unspecified, second trimester: Secondary | ICD-10-CM

## 2023-01-20 DIAGNOSIS — R1110 Vomiting, unspecified: Secondary | ICD-10-CM

## 2023-01-20 DIAGNOSIS — Z3A20 20 weeks gestation of pregnancy: Secondary | ICD-10-CM | POA: Diagnosis not present

## 2023-01-20 DIAGNOSIS — N760 Acute vaginitis: Secondary | ICD-10-CM

## 2023-01-20 DIAGNOSIS — Z8759 Personal history of other complications of pregnancy, childbirth and the puerperium: Secondary | ICD-10-CM

## 2023-01-20 HISTORY — DX: Gestational (pregnancy-induced) hypertension without significant proteinuria, unspecified trimester: O13.9

## 2023-01-20 LAB — URINALYSIS, ROUTINE W REFLEX MICROSCOPIC
Bilirubin Urine: NEGATIVE
Glucose, UA: NEGATIVE mg/dL
Hgb urine dipstick: NEGATIVE
Ketones, ur: NEGATIVE mg/dL
Leukocytes,Ua: NEGATIVE
Nitrite: NEGATIVE
Protein, ur: NEGATIVE mg/dL
Specific Gravity, Urine: 1.024 (ref 1.005–1.030)
pH: 7 (ref 5.0–8.0)

## 2023-01-20 LAB — WET PREP, GENITAL
Sperm: NONE SEEN
Trich, Wet Prep: NONE SEEN
WBC, Wet Prep HPF POC: <10 (ref <10)
Yeast Wet Prep HPF POC: NONE SEEN

## 2023-01-20 LAB — RUPTURE OF MEMBRANE (ROM)PLUS: Rom Plus: NEGATIVE

## 2023-01-20 MED ORDER — METRONIDAZOLE 500 MG PO TABS: 500.0000 mg | ORAL_TABLET | Freq: Two times a day (BID) | ORAL | 0 refills | Status: AC

## 2023-01-20 NOTE — MAU Note (Signed)
Heather Malone is a 32 y.o. at [redacted]w[redacted]d here in MAU reporting: she thinks she's having ctxs and woke with  her panties wet.  States when she has a ctx also has constant pain in upper right abdomen.  Reports hasn't felt any more fluid leaking since.  Denies VB.  Reports started has started feeling FM, has felt movement today.  LMP: NA Onset of complaint: today Pain score: 3 Vitals:   01/20/23 0938  BP: 119/75  Pulse: 93  Resp: 18  Temp: 97.8 F (36.6 C)  SpO2: 100%     FHT:159 bpm Lab orders placed from triage: UA

## 2023-01-20 NOTE — MAU Provider Note (Signed)
History     CSN: [id number]  Arrival date and time: 01/20/23 7425   Event Date/Time   First Provider Initiated Contact with Patient 01/20/2023  9:35 AM   Chief Complaint  Patient presents with   Contractions    HPI  Heather Malone is a 31 y.o. [id number] at [redacted]w[redacted]d who presents to the MAU for leaking of fluid. Woke up this AM and had large gush of clear fluid. No leaking since then. No vulvovaginal discomfort or itching. No VB. Denies urinary sxs, c/d.   Past Medical History:  Diagnosis Date   Anemia    Anxiety    Asthma    albuterol prn   Headache    migraines   Hx of trichomoniasis    Hx of varicella    Neck injury, subsequent encounter 07/26/2016   Placental abruption in third trimester 09/04/2014   Pre-eclampsia    Pregnancy induced hypertension    Subchorionic hemorrhage in third trimester 07/26/2014   Velamentous insertion of umbilical cord     Past Surgical History:  Procedure Laterality Date   TONSILLECTOMY      Family History  Problem Relation Age of Onset   Hypertension Mother    Hypertension Maternal Grandmother    Heart attack Maternal Grandmother    Hypertension Maternal Grandfather    Cancer Maternal Grandfather    Diabetes Paternal Grandmother     Social History   Tobacco Use   Smoking status: Former    Types: Cigars   Smokeless tobacco: Never  Vaping Use   Vaping status: Never Used  Substance Use Topics   Alcohol use: Not Currently    Comment: weekends   Drug use: Not Currently    Types: Marijuana    Comment: none with + UPT    Allergies: No Known Allergies  No medications prior to admission.    ROS reviewed and pertinent positives and negatives as documented in HPI.  Physical Exam   Blood pressure 137/82, pulse 95, temperature 97.8 F (36.6 C), temperature source Oral, resp. rate 18, height 5\' 2"  (1.575 m), weight 74.6 kg, last menstrual period 08/18/2022, SpO2 98%.  Physical Exam Exam conducted with a chaperone present.   Constitutional:      General: She is not in acute distress.    Appearance: Normal appearance. She is not ill-appearing.  HENT:     Head: Normocephalic and atraumatic.  Cardiovascular:     Rate and Rhythm: Normal rate.  Pulmonary:     Effort: Pulmonary effort is normal.     Breath sounds: Normal breath sounds.  Abdominal:     Palpations: Abdomen is soft.     Tenderness: There is no abdominal tenderness. There is no guarding.  Genitourinary:    Comments: Speculum exam performed - cervix visually closed, no pooling or bleeding noted Musculoskeletal:        General: Normal range of motion.  Skin:    General: Skin is warm and dry.     Findings: No rash.  Neurological:     General: No focal deficit present.     Mental Status: She is alert and oriented to person, place, and time.     MAU Course  Procedures  MDM 32 y.o. [id number] at [redacted]w[redacted]d presenting for leaking of fluid. On speculum exam, no pooling noted. ROMplus was negative. Wet prep positive for clue cells - suspect increased discharge secondary to BV. Will tx with Flagyl. Return precautions given.   Assessment and Plan  BV (bacterial vaginosis) -  Plan: Discharge patient Rx for Flagyl sent Return precautions given  Sundra Aland, MD OB Fellow, Faculty Practice [facility], Center for [facility]  01/20/2023, 3:04 PM

## 2023-01-22 ENCOUNTER — Ambulatory Visit: Payer: Medicaid Other

## 2023-01-22 VITALS — BP 188/77 | HR 93 | Temp 98.3°F | Resp 18 | Ht 62.0 in | Wt 165.0 lb

## 2023-01-22 DIAGNOSIS — R1110 Vomiting, unspecified: Secondary | ICD-10-CM

## 2023-01-22 LAB — GC/CHLAMYDIA PROBE AMP (~~LOC~~) NOT AT ARMC
Chlamydia: NEGATIVE
Chlamydia: NEGATIVE
Comment: NEGATIVE
Comment: NORMAL
Comment: NEGATIVE
Comment: NORMAL
Neisseria Gonorrhea: NEGATIVE
Neisseria Gonorrhea: NEGATIVE

## 2023-01-22 MED ORDER — INFUVITE ADULT IV SOLN
INTRAVENOUS | Status: DC
  Filled 2023-01-22: qty 10

## 2023-01-22 MED ORDER — SODIUM CHLORIDE 0.9 % IV SOLN
25.0000 mg | INTRAVENOUS | Status: DC
  Administered 2023-01-22: 25 mg via INTRAVENOUS
  Filled 2023-01-22: qty 1

## 2023-01-22 NOTE — Progress Notes (Signed)
Diagnosis: Hyperemesis  Provider:  Chilton Greathouse MD  Procedure: IV Infusion  IV Type: Peripheral, IV Location: R Antecubital  Lactated Ringers, Dose: 1000 ml with MVI  Infusion Start Time: 1427  Infusion Stop Time: 1538  Normal Saline, Dose: 1000 ml with Phenergan 25mg   Infusion Start Time: 1541  Infusion Stop Time: 1645  Post Infusion IV Care: Peripheral IV Discontinued  Discharge: Condition: Good, Destination: Home . AVS Declined  Performed by:  Adriana Mccallum, RN

## 2023-01-27 ENCOUNTER — Encounter: Payer: Medicaid Other | Admitting: Physical Therapy

## 2023-01-28 NOTE — Progress Notes (Signed)
   PRENATAL VISIT NOTE  Subjective:  Heather Malone is a 33 y.o. [id number] at [redacted]w[redacted]d being seen today for ongoing prenatal care.  She is currently monitored for the following issues for this high-risk pregnancy and has Subchorionic hematoma; Chronic headaches; Alpha thalassemia silent carrier; Elevated blood pressure complicating pregnancy in first trimester, antepartum; Supervision of high-risk pregnancy; History of severe pre-eclampsia; and Hyperemesis on their problem list.  Patient reports no complaints.  Contractions: Irritability. Vag. Bleeding: None.  Movement: Present. Denies leaking of fluid.   The following portions of the patient's history were reviewed and updated as appropriate: allergies, current medications, past family history, past medical history, past social history, past surgical history and problem list.   Objective:   Vitals:   01/29/23 0929  BP: 122/85  Pulse: 82  Weight: 167 lb 1.6 oz (75.8 kg)   Fetal Status: Fetal Heart Rate (bpm): 155 Fundal Height: 23 cm Movement: Present     General:  Alert, oriented and cooperative. Patient is in no acute distress.  Skin: Skin is warm and dry. No rash noted.   Cardiovascular: Normal heart rate noted  Respiratory: Normal respiratory effort, no problems with respiration noted  Abdomen: Soft, gravid, appropriate for gestational age.  Pain/Pressure: Absent     Pelvic: Cervical exam deferred        Extremities: Normal range of motion.  Edema: None  Mental Status: Normal mood and affect. Normal behavior. Normal judgment and thought content.   Assessment and Plan:  Pregnancy: [id number] at [redacted]w[redacted]d 1. Supervision of high risk pregnancy in second trimester (Primary) - Doing well, feeling regular and vigorous fetal movement  - AFP, Serum, Open Spina Bifida  2. [redacted] weeks gestation of pregnancy - Routine OB care including anticipatory guidance re GTT at next visit. Advised her to take zofran  the morning of her testing.  3.  Hyperemesis - Doing well with infusions, extended through end of pregnancy per pt request  4. Chronic tension-type headache, not intractable - Stable  5. Bacterial vaginosis - Switched to metrogel  to avoid nausea worsening with flagyl  pills - metroNIDAZOLE  (METROGEL ) 0.75 % vaginal gel; Place 1 Applicatorful vaginally at bedtime for 7 days.  Dispense: 70 g; Refill: 0  6. History of severe pre-eclampsia - BP stable today   Preterm labor symptoms and general obstetric precautions including but not limited to vaginal bleeding, contractions, leaking of fluid and fetal movement were reviewed in detail with the patient. Please refer to After Visit Summary for other counseling recommendations.   Return in about 4 weeks (around 02/26/2023) for IN-PERSON, LOB/GTT.  Future Appointments  Date Time Provider Department Center  01/29/2023  1:15 PM [facility]-CHAIR 3 [facility] None  02/05/2023  1:15 PM CHINF-CHAIR 5 [facility] None  02/12/2023 11:30 AM [facility] US4 [facility]  02/12/2023  1:15 PM CHINF-CHAIR 4 CH-IN[facility] None  02/25/2023  8:20 AM [facility] LAB [facility]  02/25/2023  9:15 AM Vannie Cornell SAUNDERS, CNM [facility]  03/24/2023 10:15 AM Helmus, Cori, PT [facility] None    Cornell SAUNDERS Vannie, CNM

## 2023-01-28 NOTE — L&D Delivery Note (Cosign Needed)
 OB/GYN Faculty Practice Delivery Note  Heather Malone is a 33 y.o. [id number] s/p SVD at [redacted]w[redacted]d. She was admitted for IOL gHTN.   ROM: 8h 70m with clear fluid GBS Status:  Negative/-- (04/09 1313) Maximum Maternal Temperature: 98.19F  Labor Progress: Initial SVE: 3/60/-2. She received Cytotec , FB, AROM, Pitocin  augmentation. She then progressed to complete.   Delivery Date/Time: 2332 4/24 Delivery: Called to room and patient was complete and pushing. Once arrived pt had been delivered shortly before with spontaneous cry and on mom's abdomen receiving dry stimulation.  According to nursing report," head delivered ROA. No nuchal cord present. Shoulder and body delivered in usual fashion. Infant with spontaneous cry, placed on mother's abdomen, dried and stimulated." Cord clamped x 2 after 1-minute delay, and cut by FOB. Cord blood drawn. Placenta delivered spontaneously with gentle cord traction. Fundus firm with massage and Pitocin . Labia, perineum, vagina, and cervix inspected without laceration. Mom and baby doing well.   Baby Weight: pending  Placenta: 3 vessel, intact. Sent to L&D Complications: None Lacerations: None EBL: 290 mL Analgesia: n/a   Infant:  APGAR (1 MIN): 9  APGAR (5 MINS): 9   Ebony Goldstein, MD [facility] Fellow, [facility] for [facility], [facility] 05/21/2023, 11:59 PM

## 2023-01-29 ENCOUNTER — Other Ambulatory Visit: Payer: Self-pay

## 2023-01-29 ENCOUNTER — Ambulatory Visit: Payer: Medicaid Other

## 2023-01-29 ENCOUNTER — Ambulatory Visit (INDEPENDENT_AMBULATORY_CARE_PROVIDER_SITE_OTHER): Payer: Medicaid Other | Admitting: Certified Nurse Midwife

## 2023-01-29 VITALS — BP 121/82 | HR 83 | Temp 97.8°F | Resp 18 | Ht 62.0 in | Wt 167.4 lb

## 2023-01-29 VITALS — BP 122/85 | HR 82 | Wt 167.1 lb

## 2023-01-29 DIAGNOSIS — O210 Mild hyperemesis gravidarum: Secondary | ICD-10-CM | POA: Diagnosis not present

## 2023-01-29 DIAGNOSIS — Z8759 Personal history of other complications of pregnancy, childbirth and the puerperium: Secondary | ICD-10-CM

## 2023-01-29 DIAGNOSIS — Z3A22 22 weeks gestation of pregnancy: Secondary | ICD-10-CM

## 2023-01-29 DIAGNOSIS — R1110 Vomiting, unspecified: Secondary | ICD-10-CM

## 2023-01-29 DIAGNOSIS — O0992 Supervision of high risk pregnancy, unspecified, second trimester: Secondary | ICD-10-CM | POA: Diagnosis not present

## 2023-01-29 DIAGNOSIS — N760 Acute vaginitis: Secondary | ICD-10-CM

## 2023-01-29 DIAGNOSIS — B9689 Other specified bacterial agents as the cause of diseases classified elsewhere: Secondary | ICD-10-CM

## 2023-01-29 DIAGNOSIS — G44229 Chronic tension-type headache, not intractable: Secondary | ICD-10-CM

## 2023-01-29 MED ORDER — INFUVITE ADULT IV SOLN
INTRAVENOUS | Status: DC
  Filled 2023-01-29: qty 10

## 2023-01-29 MED ORDER — SODIUM CHLORIDE 0.9 % IV SOLN
25.0000 mg | INTRAVENOUS | Status: DC
  Administered 2023-01-29: 25 mg via INTRAVENOUS
  Filled 2023-01-29: qty 1

## 2023-01-29 MED ORDER — METRONIDAZOLE 0.75 % VA GEL: 1.0000 | Freq: Every day | VAGINAL | 0 refills | Status: AC

## 2023-01-29 NOTE — Addendum Note (Signed)
 Addended by: Brien Mates T on: 01/29/2023 10:22 AM   Modules accepted: Orders

## 2023-01-29 NOTE — Progress Notes (Deleted)
   PRENATAL VISIT NOTE  Subjective:  Heather Malone is a 33 y.o. [id number] at [redacted]w[redacted]d being seen today for ongoing prenatal care.  She is currently monitored for the following issues for this {Blank single:19197::high-risk,low-risk} pregnancy and has Subchorionic hematoma; Chronic headaches; Alpha thalassemia silent carrier; Elevated blood pressure complicating pregnancy in first trimester, antepartum; Supervision of high-risk pregnancy; History of severe pre-eclampsia; and Hyperemesis on their problem list.  Patient presented to MAU 01/20/23 for leaking fluid; exam revealed closed cervix, without pooling or bleeding and negative ROMplus. Flagyl  Rx for positive clue cells on wet prep. Since then patient reports:   Lower abdominal pain and PFPT:   Patient reports {sx:14538}.   .  .   . Denies leaking of fluid.   The following portions of the patient's history were reviewed and updated as appropriate: allergies, current medications, past family history, past medical history, past social history, past surgical history and problem list.   Objective:   Vitals:   01/29/23 0929  Weight: 167 lb 1.6 oz (75.8 kg)    Fetal Status:           General:  Alert, oriented and cooperative. Patient is in no acute distress.  Skin: Skin is warm and dry. No rash noted.   Cardiovascular: Normal heart rate noted  Respiratory: Normal respiratory effort, no problems with respiration noted  Abdomen: Soft, gravid, appropriate for gestational age.        Pelvic: Cervical exam deferred        Extremities: Normal range of motion.     Mental Status: Normal mood and affect. Normal behavior. Normal judgment and thought content.   Assessment and Plan:  Pregnancy: [id number] at [redacted]w[redacted]d 1. Supervision of high risk pregnancy in second trimester (Primary) ***  2. [redacted] weeks gestation of pregnancy ***  3. Hyperemesis ***  4. Chronic tension-type headache, not intractable ***  {Blank single:19197::Term,Preterm}  labor symptoms and general obstetric precautions including but not limited to vaginal bleeding, contractions, leaking of fluid and fetal movement were reviewed in detail with the patient. Please refer to After Visit Summary for other counseling recommendations.   No follow-ups on file.  Future Appointments  Date Time Provider Department Center  01/29/2023  1:15 PM CH[facility]-CHAIR 3 [facility] None  02/05/2023  1:15 PM CHINF-CHAIR 5 [facility]-IN[facility] None  02/12/2023 11:30 AM [facility] US4 [facility]  02/12/2023  1:15 PM CHINF-CHAIR 4 CH-INFWM None  03/24/2023 10:15 AM Helmus, Jitka, PT [facility] None    Jorene FORBES Moats, PA-C

## 2023-01-29 NOTE — Progress Notes (Signed)
 Diagnosis: Hyperemesis  Provider:  Lonna Coder MD  Procedure: IV Infusion  IV Type: Peripheral, IV Location: L Antecubital  Banana Bag, Dose: 1000 ml  Infusion Start Time: 1330  Infusion Stop Time: 1432   Phenergan  25mg  in NS, Dose: 25mg   Infusion Start Time: 1438  Infusion Stop Time: 1545  Post Infusion IV Care: Peripheral IV Discontinued  Discharge: Condition: Good, Destination: Home . AVS Declined  Performed by:  Leita FORBES Miles, LPN

## 2023-01-31 LAB — AFP, SERUM, OPEN SPINA BIFIDA
AFP MoM: 0.79
AFP Value: 58.9 ng/mL
Gest. Age on Collection Date: 22 wk
Maternal Age At EDD: 33.2 a
OSBR Risk 1 IN: 10000
Test Results:: NEGATIVE
Weight: 167 [lb_av]

## 2023-02-02 ENCOUNTER — Encounter: Payer: Self-pay | Admitting: Certified Nurse Midwife

## 2023-02-04 ENCOUNTER — Encounter (HOSPITAL_COMMUNITY): Payer: Self-pay | Admitting: Obstetrics & Gynecology

## 2023-02-04 ENCOUNTER — Other Ambulatory Visit: Payer: Self-pay

## 2023-02-04 ENCOUNTER — Inpatient Hospital Stay (HOSPITAL_COMMUNITY)
Admission: AD | Admit: 2023-02-04 | Discharge: 2023-02-04 | Disposition: A | Discharge status: Home / Self Care | Payer: Medicaid Other | Attending: Obstetrics & Gynecology | Admitting: Obstetrics & Gynecology

## 2023-02-04 DIAGNOSIS — Z3A22 22 weeks gestation of pregnancy: Secondary | ICD-10-CM | POA: Insufficient documentation

## 2023-02-04 DIAGNOSIS — O0992 Supervision of high risk pregnancy, unspecified, second trimester: Secondary | ICD-10-CM

## 2023-02-04 DIAGNOSIS — G43109 Migraine with aura, not intractable, without status migrainosus: Secondary | ICD-10-CM | POA: Insufficient documentation

## 2023-02-04 DIAGNOSIS — R1110 Vomiting, unspecified: Secondary | ICD-10-CM

## 2023-02-04 DIAGNOSIS — R21 Rash and other nonspecific skin eruption: Secondary | ICD-10-CM | POA: Insufficient documentation

## 2023-02-04 DIAGNOSIS — O26892 Other specified pregnancy related conditions, second trimester: Secondary | ICD-10-CM | POA: Insufficient documentation

## 2023-02-04 DIAGNOSIS — Z8759 Personal history of other complications of pregnancy, childbirth and the puerperium: Secondary | ICD-10-CM

## 2023-02-04 DIAGNOSIS — O99352 Diseases of the nervous system complicating pregnancy, second trimester: Secondary | ICD-10-CM | POA: Diagnosis present

## 2023-02-04 LAB — COMPREHENSIVE METABOLIC PANEL
ALT: 14 U/L (ref 0–44)
AST: 16 U/L (ref 15–41)
Albumin: 3.1 g/dL — ABNORMAL LOW (ref 3.5–5.0)
Alkaline Phosphatase: 40 U/L (ref 38–126)
Anion gap: 10 (ref 5–15)
BUN: 6 mg/dL (ref 6–20)
CO2: 22 mmol/L (ref 22–32)
Calcium: 9.2 mg/dL (ref 8.9–10.3)
Chloride: 104 mmol/L (ref 98–111)
Creatinine, Ser: 0.37 mg/dL — ABNORMAL LOW (ref 0.44–1.00)
GFR, Estimated: >60 mL/min (ref >60)
Glucose, Bld: 81 mg/dL (ref 70–99)
Potassium: 4.3 mmol/L (ref 3.5–5.1)
Sodium: 136 mmol/L (ref 135–145)
Total Bilirubin: 0.4 mg/dL (ref 0.0–1.2)
Total Protein: 6.8 g/dL (ref 6.5–8.1)

## 2023-02-04 LAB — URINALYSIS, ROUTINE W REFLEX MICROSCOPIC
Bilirubin Urine: NEGATIVE
Glucose, UA: NEGATIVE mg/dL
Hgb urine dipstick: NEGATIVE
Ketones, ur: NEGATIVE mg/dL
Nitrite: NEGATIVE
Protein, ur: NEGATIVE mg/dL
Specific Gravity, Urine: 1.016 (ref 1.005–1.030)
pH: 6 (ref 5.0–8.0)

## 2023-02-04 LAB — CBC
HCT: 29.3 % — ABNORMAL LOW (ref 36.0–46.0)
Hemoglobin: 9.4 g/dL — ABNORMAL LOW (ref 12.0–15.0)
MCH: 27.9 pg (ref 26.0–34.0)
MCHC: 32.1 g/dL (ref 30.0–36.0)
MCV: 86.9 fL (ref 80.0–100.0)
Platelets: 296 10*3/uL (ref 150–400)
RBC: 3.37 MIL/uL — ABNORMAL LOW (ref 3.87–5.11)
RDW: 13.6 % (ref 11.5–15.5)
WBC: 9 10*3/uL (ref 4.0–10.5)
nRBC: 0 % (ref 0.0–0.2)

## 2023-02-04 MED ORDER — DIPHENHYDRAMINE HCL 25 MG PO CAPS
25.0000 mg | ORAL_CAPSULE | ORAL | Status: AC
  Administered 2023-02-04: 25 mg via ORAL
  Filled 2023-02-04: qty 1

## 2023-02-04 MED ORDER — TRIAMCINOLONE ACETONIDE 0.025 % EX OINT: 1.0000 | TOPICAL_OINTMENT | Freq: Two times a day (BID) | CUTANEOUS | 0 refills | Status: DC

## 2023-02-04 MED ORDER — PROCHLORPERAZINE MALEATE 10 MG PO TABS
10.0000 mg | ORAL_TABLET | Freq: Once | ORAL | Status: AC
  Administered 2023-02-04: 10 mg via ORAL
  Filled 2023-02-04: qty 1

## 2023-02-04 MED ORDER — CYCLOBENZAPRINE HCL 5 MG PO TABS: 10.0000 mg | ORAL_TABLET | Freq: Once | ORAL | Status: DC

## 2023-02-04 MED ORDER — PROMETHAZINE HCL 12.5 MG PO TABS: 12.5000 mg | ORAL_TABLET | Freq: Four times a day (QID) | ORAL | 0 refills | Status: DC | PRN

## 2023-02-04 MED ORDER — CYCLOBENZAPRINE HCL 5 MG PO TABS
10.0000 mg | ORAL_TABLET | Freq: Once | ORAL | Status: AC
  Administered 2023-02-04: 10 mg via ORAL
  Filled 2023-02-04: qty 2

## 2023-02-04 MED ORDER — ACETAMINOPHEN-CAFFEINE 500-65 MG PO TABS
2.0000 | ORAL_TABLET | Freq: Once | ORAL | Status: AC
  Administered 2023-02-04: 2 via ORAL
  Filled 2023-02-04: qty 2

## 2023-02-04 MED ORDER — KETOROLAC TROMETHAMINE 30 MG/ML IJ SOLN
30.0000 mg | Freq: Once | INTRAMUSCULAR | Status: AC
  Administered 2023-02-04: 30 mg via INTRAVENOUS
  Filled 2023-02-04: qty 1

## 2023-02-04 MED ORDER — SODIUM CHLORIDE 0.9 % IV SOLN
25.0000 mg | Freq: Once | INTRAVENOUS | Status: AC
  Administered 2023-02-04: 25 mg via INTRAVENOUS
  Filled 2023-02-04: qty 1

## 2023-02-04 NOTE — MAU Note (Signed)
.  Heather Malone is a 33 y.o. at [redacted]w[redacted]d here in MAU reporting: she's had a persistent HA for the past 3 days.  States she's taken Fioricet  & Tylenol , no relief noted.  Reports has been seeing spots, denies epigastric pain.  Also reports she has bruising to left lower abdomen that's sore and has whelps.  Denies VB or LOf.  Endorses +FM.  LMP: NA Onset of complaint: 3 days Pain score: 8 Vitals:   02/04/23 1723  BP: 128/87  Pulse: 95  Resp: 18  Temp: 98.3 F (36.8 C)  SpO2: 100%     FHT:154 bpm Lab orders placed from triage: UA

## 2023-02-04 NOTE — MAU Provider Note (Addendum)
 History     CSN: [id number]  Arrival date and time: 02/04/23 1705   Event Date/Time   First Provider Initiated Contact with Patient 02/04/2023  5:21 PM   Chief Complaint  Patient presents with   Headache    HPI  Heather Malone is a 33 y.o. [id number] at [redacted]w[redacted]d who presents to the MAU for migraine headache. Ongoing x 3 days. Has known hx migraines, worse in pregnancy. Reports having floaters in vision, which is part of her typical aura. Endorses photophobia, phonophobia. Typically drinks coffee daily, hasn't during past two days. Drinking ~1 gallon of water a day. Also reports left lower abdominal rash which is painful -- appears bruised   Denies fevers, chills, n/v, c/d, urinary sxs, LOF, VB, ctx. Recently started Metrogel  for BV.  Past Medical History:  Diagnosis Date   Anemia    Anxiety    Asthma    albuterol  prn   Headache    migraines   Hx of trichomoniasis    Hx of varicella    Neck injury, subsequent encounter 07/26/2016   Placental abruption in third trimester 09/04/2014   Pre-eclampsia    Pregnancy induced hypertension    Subchorionic hemorrhage in third trimester 07/26/2014   Velamentous insertion of umbilical cord     Past Surgical History:  Procedure Laterality Date   TONSILLECTOMY      Family History  Problem Relation Age of Onset   Hypertension Mother    Hypertension Maternal Grandmother    Heart attack Maternal Grandmother    Hypertension Maternal Grandfather    Cancer Maternal Grandfather    Diabetes Paternal Grandmother     Social History   Tobacco Use   Smoking status: Former    Types: Cigars   Smokeless tobacco: Never  Vaping Use   Vaping status: Never Used  Substance Use Topics   Alcohol use: Not Currently    Comment: weekends   Drug use: Not Currently    Types: Marijuana    Comment: none with + UPT    Allergies: No Known Allergies  Medications Prior to Admission  Medication Sig Dispense Refill Last Dose/Taking   acetaminophen   (TYLENOL ) 500 MG tablet Take 1,000 mg by mouth every 6 (six) hours as needed for moderate pain (pain score 4-6).   02/04/2023 at 12:00 PM   butalbital -acetaminophen -caffeine  (FIORICET ) 50-325-40 MG tablet Take 2 tablets by mouth 2 (two) times daily as needed for headache.   02/04/2023 at  1:00 PM   cyclobenzaprine  (FLEXERIL ) 10 MG tablet Take 1 tablet (10 mg total) by mouth every 8 (eight) hours as needed for muscle spasms. 30 tablet 1 Past Week   Doxylamine -Pyridoxine ER (BONJESTA ) 20-20 MG TBCR Take 1 tablet by mouth at bedtime. 60 tablet 3 02/03/2023 Evening   gabapentin  (NEURONTIN ) 300 MG capsule Take 1 capsule (300 mg total) by mouth 3 (three) times daily as needed. 90 capsule 2 Past Week   metroNIDAZOLE  (METROGEL ) 0.75 % vaginal gel Place 1 Applicatorful vaginally at bedtime for 7 days. 70 g 0 02/03/2023 Bedtime   Prenatal Vit-Fe Fumarate-FA (MULTIVITAMIN-PRENATAL) 27-0.8 MG TABS tablet Take 1 tablet by mouth daily at 12 noon. 30 tablet 0 02/04/2023 Morning   ondansetron  (ZOFRAN ) 8 MG tablet Take 1 tablet (8 mg total) by mouth every 8 (eight) hours as needed for nausea or vomiting. Can take miralax  in apple juice if needed for constipation. (Patient not taking: Reported on 01/29/2023) 20 tablet 0     ROS reviewed and pertinent positives and negatives  as documented in HPI.  Physical Exam   Blood pressure 128/87, pulse 95, temperature 98.3 F (36.8 C), temperature source Oral, resp. rate 18, height 5' 2 (1.575 m), weight 75.7 kg, last menstrual period 08/18/2022, SpO2 100%.  Physical Exam Constitutional:      General: She is not in acute distress.    Appearance: Normal appearance. She is not ill-appearing.  HENT:     Head: Normocephalic and atraumatic.  Cardiovascular:     Rate and Rhythm: Normal rate.  Pulmonary:     Effort: Pulmonary effort is normal.     Breath sounds: Normal breath sounds.  Abdominal:     Palpations: Abdomen is soft.     Tenderness: There is no abdominal tenderness. There  is no guarding.  Musculoskeletal:        General: Normal range of motion.  Skin:    General: Skin is warm and dry.     Findings: No rash.     Comments: 6cm linear hyperpigmented flat macule, tender to palpation, appears to be ecchymotic; has erythematous macular lesion as well, medial and superior to hyperpigmented lesion  Neurological:     General: No focal deficit present.     Mental Status: She is alert and oriented to person, place, and time.     MAU Course  Procedures  MDM 33 y.o. [id number] at [redacted]w[redacted]d presenting for headache and abdominal rash. Has hx migraine headaches, current episode similar and has been refractory to fioricet . Has rash as well, unclear etiology though appears like a bruise. Will order Excedrin Tension (suspect caffeine  withdrawal headache also compounding presentation), Benadryl , Compazine , and reassess.  1924 Pt reassessed, now having band like pain, has significant trapezius muscle spasming. Will order Flexeril  and reassess. Will get labs and give IV fluids if not improving.   2037 Pt reassessed after Flexeril  -- HA still present. Will get CBC, CMP, give IV fluid bolus + Phenergan . Consider imaging if still not resolving.  2143 Labs back -- HgB 9.4 (stable), otherwise CBC and CMP reassuring. Pt reassessed, reports improvement in HA. Phenergan  bag about halfway complete. Will reassess once complete.  2209 Pt reports headache improved after completion of IV fluids, now rates 6/10 (improved). Stable for d/c.   Assessment and Plan  Migraine with aura and without status migrainosus, not intractable Improved w IV fluids, migraine cocktail No concerning neuro sxs Electrolytes wnl Return precautions given  Rash Unclear etiology -- do not suspect cholestasis given no itching, CMP wnl and not typical for GA, will trial Kenalog  cream and advised pt to f/up w primary OB  Alain Sor, MD OB Fellow, Faculty Practice [facility], Center for [facility]

## 2023-02-05 ENCOUNTER — Telehealth: Payer: Self-pay | Admitting: Family Medicine

## 2023-02-05 ENCOUNTER — Ambulatory Visit (INDEPENDENT_AMBULATORY_CARE_PROVIDER_SITE_OTHER): Payer: Medicaid Other

## 2023-02-05 VITALS — BP 118/80 | HR 81 | Temp 97.8°F | Resp 16 | Ht 62.0 in | Wt 171.8 lb

## 2023-02-05 DIAGNOSIS — R1110 Vomiting, unspecified: Secondary | ICD-10-CM | POA: Diagnosis not present

## 2023-02-05 MED ORDER — LACTATED RINGERS IV SOLN
Freq: Once | INTRAVENOUS | Status: AC
Start: 2023-02-05 — End: 2023-02-05
  Filled 2023-02-05: qty 10

## 2023-02-05 MED ORDER — INFUVITE ADULT IV SOLN: INTRAVENOUS | Status: DC

## 2023-02-05 MED ORDER — SODIUM CHLORIDE 0.9 % IV SOLN
25.0000 mg | INTRAVENOUS | Status: DC
  Administered 2023-02-05: 25 mg via INTRAVENOUS
  Filled 2023-02-05: qty 1

## 2023-02-05 NOTE — Progress Notes (Signed)
 Diagnosis: Dehydration  Provider:  Praveen Mannam MD  Procedure: IV Infusion  IV Type: Peripheral, IV Location: L Antecubital  NS with 25mg  Phenergan , Dose: 1000 ml  Infusion Start Time: 1358  Infusion Stop Time: 1504    Banana Bag, Dose: 1000 ml  Infusion Start Time: 1507  Infusion Stop Time: 1611  Post Infusion IV Care: Patient declined observation and Peripheral IV Discontinued  Discharge: Condition: Stable, Destination: Home . AVS Declined  Performed by:  Donny Childes, RN

## 2023-02-05 NOTE — Telephone Encounter (Signed)
 Patient was seen in the MAU and is following up about a bruise on her stomach. Patient says the MAU could not determine the cause so she is wanting to follow up with her OB about it she says the bruise has whelps and when she woke up this morning it was darker than it was yesterday. I did let the patient know she could message Jamilla through MyChart and upload a picture of the bruise. She will do that as well. Patient still requesting to speak to [facility] or a nurse if available.

## 2023-02-06 NOTE — Telephone Encounter (Signed)
 Spoke to patient. Patient stated she does not know how she got the bruise. Patient was informed to watch closely on the bruise and if she notice more pain to touch on the bruise or any vaginal bleeding, she is aware she will need to head to MAU.   Patient request if provider Cornell can put her IV infusion orders to cont. IV infusion through out her pregnancy.  Will route to [facility]. Ermalinda GRADE CMA

## 2023-02-11 ENCOUNTER — Encounter (HOSPITAL_COMMUNITY): Payer: Self-pay | Admitting: Obstetrics & Gynecology

## 2023-02-11 ENCOUNTER — Inpatient Hospital Stay (HOSPITAL_COMMUNITY): Payer: Medicaid Other

## 2023-02-11 ENCOUNTER — Ambulatory Visit: Payer: Medicaid Other | Admitting: Certified Nurse Midwife

## 2023-02-11 ENCOUNTER — Inpatient Hospital Stay (HOSPITAL_COMMUNITY)
Admission: AD | Admit: 2023-02-11 | Discharge: 2023-02-18 | DRG: 833 | Disposition: A | Discharge status: Home / Self Care | Payer: Medicaid Other | Attending: Obstetrics & Gynecology | Admitting: Obstetrics & Gynecology

## 2023-02-11 VITALS — BP 142/90 | HR 96 | Wt 159.0 lb

## 2023-02-11 DIAGNOSIS — Z8249 Family history of ischemic heart disease and other diseases of the circulatory system: Secondary | ICD-10-CM

## 2023-02-11 DIAGNOSIS — O26899 Other specified pregnancy related conditions, unspecified trimester: Principal | ICD-10-CM | POA: Diagnosis present

## 2023-02-11 DIAGNOSIS — O0992 Supervision of high risk pregnancy, unspecified, second trimester: Secondary | ICD-10-CM

## 2023-02-11 DIAGNOSIS — M79606 Pain in leg, unspecified: Secondary | ICD-10-CM | POA: Diagnosis present

## 2023-02-11 DIAGNOSIS — Z3A24 24 weeks gestation of pregnancy: Secondary | ICD-10-CM

## 2023-02-11 DIAGNOSIS — O26892 Other specified pregnancy related conditions, second trimester: Principal | ICD-10-CM | POA: Diagnosis present

## 2023-02-11 DIAGNOSIS — R519 Headache, unspecified: Secondary | ICD-10-CM | POA: Diagnosis present

## 2023-02-11 DIAGNOSIS — O99019 Anemia complicating pregnancy, unspecified trimester: Secondary | ICD-10-CM | POA: Diagnosis present

## 2023-02-11 DIAGNOSIS — R531 Weakness: Secondary | ICD-10-CM | POA: Diagnosis present

## 2023-02-11 DIAGNOSIS — R1033 Periumbilical pain: Secondary | ICD-10-CM

## 2023-02-11 DIAGNOSIS — K5900 Constipation, unspecified: Secondary | ICD-10-CM | POA: Diagnosis present

## 2023-02-11 DIAGNOSIS — R262 Difficulty in walking, not elsewhere classified: Secondary | ICD-10-CM | POA: Diagnosis present

## 2023-02-11 DIAGNOSIS — O99012 Anemia complicating pregnancy, second trimester: Secondary | ICD-10-CM | POA: Diagnosis present

## 2023-02-11 DIAGNOSIS — Z148 Genetic carrier of other disease: Secondary | ICD-10-CM

## 2023-02-11 DIAGNOSIS — Z3A23 23 weeks gestation of pregnancy: Secondary | ICD-10-CM

## 2023-02-11 DIAGNOSIS — R109 Unspecified abdominal pain: Secondary | ICD-10-CM | POA: Diagnosis not present

## 2023-02-11 DIAGNOSIS — J45909 Unspecified asthma, uncomplicated: Secondary | ICD-10-CM | POA: Diagnosis present

## 2023-02-11 DIAGNOSIS — Z833 Family history of diabetes mellitus: Secondary | ICD-10-CM

## 2023-02-11 DIAGNOSIS — Z87891 Personal history of nicotine dependence: Secondary | ICD-10-CM

## 2023-02-11 DIAGNOSIS — O2242 Hemorrhoids in pregnancy, second trimester: Secondary | ICD-10-CM | POA: Diagnosis present

## 2023-02-11 DIAGNOSIS — R590 Localized enlarged lymph nodes: Secondary | ICD-10-CM

## 2023-02-11 DIAGNOSIS — R599 Enlarged lymph nodes, unspecified: Secondary | ICD-10-CM | POA: Diagnosis present

## 2023-02-11 DIAGNOSIS — O99512 Diseases of the respiratory system complicating pregnancy, second trimester: Secondary | ICD-10-CM | POA: Diagnosis present

## 2023-02-11 DIAGNOSIS — O368320 Maternal care for abnormalities of the fetal heart rate or rhythm, second trimester, not applicable or unspecified: Secondary | ICD-10-CM | POA: Diagnosis present

## 2023-02-11 LAB — CBC WITH DIFFERENTIAL/PLATELET
Abs Immature Granulocytes: 0.04 10*3/uL (ref 0.00–0.07)
Basophils Absolute: 0 10*3/uL (ref 0.0–0.1)
Basophils Relative: 0 %
Eosinophils Absolute: 0.2 10*3/uL (ref 0.0–0.5)
Eosinophils Relative: 3 %
HCT: 30.2 % — ABNORMAL LOW (ref 36.0–46.0)
Hemoglobin: 9.7 g/dL — ABNORMAL LOW (ref 12.0–15.0)
Immature Granulocytes: 1 %
Lymphocytes Relative: 23 %
Lymphs Abs: 1.6 10*3/uL (ref 0.7–4.0)
MCH: 28.2 pg (ref 26.0–34.0)
MCHC: 32.1 g/dL (ref 30.0–36.0)
MCV: 87.8 fL (ref 80.0–100.0)
Monocytes Absolute: 0.6 10*3/uL (ref 0.1–1.0)
Monocytes Relative: 9 %
Neutro Abs: 4.4 10*3/uL (ref 1.7–7.7)
Neutrophils Relative %: 64 %
Platelets: 314 10*3/uL (ref 150–400)
RBC: 3.44 MIL/uL — ABNORMAL LOW (ref 3.87–5.11)
RDW: 13.7 % (ref 11.5–15.5)
WBC: 6.9 10*3/uL (ref 4.0–10.5)
nRBC: 0 % (ref 0.0–0.2)

## 2023-02-11 LAB — COMPREHENSIVE METABOLIC PANEL
ALT: 13 U/L (ref 0–44)
AST: 13 U/L — ABNORMAL LOW (ref 15–41)
Albumin: 3.2 g/dL — ABNORMAL LOW (ref 3.5–5.0)
Alkaline Phosphatase: 41 U/L (ref 38–126)
Anion gap: 8 (ref 5–15)
BUN: <5 mg/dL — ABNORMAL LOW (ref 6–20)
CO2: 23 mmol/L (ref 22–32)
Calcium: 9 mg/dL (ref 8.9–10.3)
Chloride: 105 mmol/L (ref 98–111)
Creatinine, Ser: 0.44 mg/dL (ref 0.44–1.00)
GFR, Estimated: >60 mL/min (ref >60)
Glucose, Bld: 82 mg/dL (ref 70–99)
Potassium: 4.4 mmol/L (ref 3.5–5.1)
Sodium: 136 mmol/L (ref 135–145)
Total Bilirubin: 0.6 mg/dL (ref 0.0–1.2)
Total Protein: 6.9 g/dL (ref 6.5–8.1)

## 2023-02-11 LAB — URINALYSIS, ROUTINE W REFLEX MICROSCOPIC
Bacteria, UA: NONE SEEN
Bilirubin Urine: NEGATIVE
Glucose, UA: NEGATIVE mg/dL
Hgb urine dipstick: NEGATIVE
Ketones, ur: NEGATIVE mg/dL
Nitrite: NEGATIVE
Protein, ur: NEGATIVE mg/dL
Specific Gravity, Urine: 1.012 (ref 1.005–1.030)
pH: 7 (ref 5.0–8.0)

## 2023-02-11 LAB — TYPE AND SCREEN
ABO/RH(D): B POS
Antibody Screen: NEGATIVE

## 2023-02-11 LAB — WET PREP, GENITAL
Clue Cells Wet Prep HPF POC: NONE SEEN
Sperm: NONE SEEN
Trich, Wet Prep: NONE SEEN
WBC, Wet Prep HPF POC: >=10 — AB (ref <10)
Yeast Wet Prep HPF POC: NONE SEEN

## 2023-02-11 LAB — C-REACTIVE PROTEIN: CRP: 0.7 mg/dL (ref <1.0)

## 2023-02-11 MED ORDER — SODIUM CHLORIDE 0.9 % IV SOLN
25.0000 mg | Freq: Once | INTRAVENOUS | Status: AC
  Administered 2023-02-11: 25 mg via INTRAVENOUS
  Filled 2023-02-11: qty 1

## 2023-02-11 MED ORDER — DOCUSATE SODIUM 100 MG PO CAPS
100.0000 mg | ORAL_CAPSULE | Freq: Every day | ORAL | Status: DC
  Administered 2023-02-11: 100 mg via ORAL
  Filled 2023-02-11: qty 1

## 2023-02-11 MED ORDER — CYCLOBENZAPRINE HCL 5 MG PO TABS
10.0000 mg | ORAL_TABLET | Freq: Once | ORAL | Status: AC
  Administered 2023-02-11: 10 mg via ORAL
  Filled 2023-02-11: qty 2

## 2023-02-11 MED ORDER — ACETAMINOPHEN 325 MG PO TABS
650.0000 mg | ORAL_TABLET | ORAL | Status: DC | PRN
  Administered 2023-02-12 – 2023-02-18 (×16): 650 mg via ORAL
  Filled 2023-02-11 (×17): qty 2

## 2023-02-11 MED ORDER — LACTATED RINGERS IV SOLN: 125.0000 mL/h | INTRAVENOUS | Status: DC

## 2023-02-11 MED ORDER — LACTATED RINGERS IV SOLN: INTRAVENOUS | Status: AC

## 2023-02-11 MED ORDER — PRENATAL MULTIVITAMIN CH
1.0000 | ORAL_TABLET | Freq: Every day | ORAL | Status: DC
  Administered 2023-02-12 – 2023-02-17 (×6): 1 via ORAL
  Filled 2023-02-11 (×6): qty 1

## 2023-02-11 MED ORDER — FENTANYL CITRATE (PF) 100 MCG/2ML IJ SOLN
100.0000 ug | Freq: Once | INTRAMUSCULAR | Status: AC
Start: 2023-02-11 — End: 2023-02-11
  Administered 2023-02-11: 100 ug via INTRAVENOUS
  Filled 2023-02-11: qty 2

## 2023-02-11 MED ORDER — CALCIUM CARBONATE ANTACID 500 MG PO CHEW
2.0000 | CHEWABLE_TABLET | ORAL | Status: DC | PRN
Start: 2023-02-11 — End: 2023-02-18

## 2023-02-11 MED ORDER — FENTANYL CITRATE (PF) 100 MCG/2ML IJ SOLN
50.0000 ug | INTRAMUSCULAR | Status: DC | PRN
  Administered 2023-02-11 – 2023-02-12 (×4): 50 ug via INTRAVENOUS
  Filled 2023-02-11 (×6): qty 2

## 2023-02-11 NOTE — MAU Note (Signed)
.  Heather Malone is a 33 y.o. at [redacted]w[redacted]d here in MAU reporting: started having pain last night around 1900. The pain starts on her right side around her navel and wraps to the right and up creating a "c shape." She also reports right lower quadrant pain. Still has her appendix. No rebound tenderness. She reports it feels like a pulling sensation. She is also reporting an increase in HA's recently and has one now. She reports her initial BP in office today was elevated and the repeat was normal. She reports she is currently being treated for BV. Denies VB or LOF. Reports +FM.   Hx severe pre-e with PTD at 34w. Hx placental abruption in G1.  NPO:  Solids 0800 this morning Fluids 20 minutes ago in office  Last doses: 0630 Flexeril  10 mg Zofran  yesterday Fioricet  0630 0100 Tylenol   Onset of complaint: Last night Pain score: 8/10 abdomen   Vitals:   02/11/23 1255  BP: 124/81  Pulse: 82  Resp: 18  Temp: 98.3 F (36.8 C)  SpO2: 100%     FHT: 160 initial external Lab orders placed from triage: UA

## 2023-02-11 NOTE — MAU Note (Addendum)
 RN entered room to find patient quiet, in bed, with eyes closed. Mother of patient at bedside. RN awoke patient and informed her that MRI is ready to take her now. RN asked about patient's pain level. Patient reports that her pain is unchanged after medications, but that she just fell asleep.  Patient transport at bedside to transport patient to MRI.

## 2023-02-11 NOTE — MAU Provider Note (Signed)
Chief Complaint:  Abdominal Pain and Headache   HPI   Event Date/Time   First Provider Initiated Contact with Patient 02/11/23 1315      Heather Malone is a 33 y.o. [id number] at [redacted]w[redacted]d who presents to maternity admissions reporting abdominal pain on right side. Pain is present throughout right abdomen, with worst part on RLQ. She reports she last took Tylenol at 0100, flexeril at 0600. The pain started last night around 1900, with it getting worse this morning when she woke up at 0100. Reports she still has her appendix and gallbladder. Reports current treatment for BV. She reports an increase in HA, which she has now.  Reports her pain is 8/10 in abdomen and head.   Pregnancy Course: Hyperemesis with infusions, bacterial vaginosis, history of sever pre-E, elevated AFP, Headaches  Past Medical History:  Diagnosis Date   Anemia    Anxiety    Asthma    albuterol prn   Headache    migraines   Hx of trichomoniasis    Hx of varicella    Neck injury, subsequent encounter 07/26/2016   Placental abruption in third trimester 09/04/2014   Pre-eclampsia    Pregnancy induced hypertension    Subchorionic hemorrhage in third trimester 07/26/2014   Velamentous insertion of umbilical cord    OB History  Gravida Para Term Preterm AB Living  3 2 1 1  0 2  SAB IAB Ectopic Multiple Live Births  0 0 0 0 2    # Outcome Date GA Lbr Len/2nd Weight Sex Type Anes PTL Lv  3 Current           2 Preterm 09/12/20 [redacted]w[redacted]d 03:28 1900 g F Vag-Spont None  LIV     Birth Comments: IOL for severe pre-eclampsia  1 Term 09/05/14 [redacted]w[redacted]d 1578:09 / 00:10 3340 g M Vag-Spont None  LIV     Birth Comments: placenta abrution, velamentous cord insertion   Past Surgical History:  Procedure Laterality Date   TONSILLECTOMY     Family History  Problem Relation Age of Onset   Hypertension Mother    Hypertension Maternal Grandmother    Heart attack Maternal Grandmother    Hypertension Maternal Grandfather    Cancer  Maternal Grandfather    Diabetes Paternal Grandmother    Social History   Tobacco Use   Smoking status: Former    Types: Cigars   Smokeless tobacco: Never  Vaping Use   Vaping status: Never Used  Substance Use Topics   Alcohol use: Not Currently    Comment: weekends   Drug use: Not Currently    Types: Marijuana    Comment: none with + UPT   No Known Allergies Medications Prior to Admission  Medication Sig Dispense Refill Last Dose/Taking   cyclobenzaprine (FLEXERIL) 10 MG tablet Take 1 tablet (10 mg total) by mouth every 8 (eight) hours as needed for muscle spasms. 30 tablet 1 02/11/2023   acetaminophen (TYLENOL) 500 MG tablet Take 1,000 mg by mouth every 6 (six) hours as needed for moderate pain (pain score 4-6).      butalbital-acetaminophen-caffeine (FIORICET) 50-325-40 MG tablet Take 2 tablets by mouth 2 (two) times daily as needed for headache.      Doxylamine-Pyridoxine ER (BONJESTA) 20-20 MG TBCR Take 1 tablet by mouth at bedtime. 60 tablet 3    gabapentin (NEURONTIN) 300 MG capsule Take 1 capsule (300 mg total) by mouth 3 (three) times daily as needed. 90 capsule 2    ondansetron (ZOFRAN) 8  MG tablet Take 1 tablet (8 mg total) by mouth every 8 (eight) hours as needed for nausea or vomiting. Can take miralax in apple juice if needed for constipation. (Patient not taking: Reported on 02/11/2023) 20 tablet 0    Prenatal Vit-Fe Fumarate-FA (MULTIVITAMIN-PRENATAL) 27-0.8 MG TABS tablet Take 1 tablet by mouth daily at 12 noon. 30 tablet 0    promethazine (PHENERGAN) 12.5 MG tablet Take 1 tablet (12.5 mg total) by mouth every 6 (six) hours as needed for nausea or vomiting. 30 tablet 0    triamcinolone (KENALOG) 0.025 % ointment Apply 1 Application topically 2 (two) times daily. Do not use for longer than 7 days 30 g 0     I have reviewed patient's Past Medical Hx, Surgical Hx, Family Hx, Social Hx, medications and allergies.   ROS  Pertinent items noted in HPI and remainder of  comprehensive ROS otherwise negative.   PHYSICAL EXAM  Patient Vitals for the past 24 hrs:  BP Temp Temp src Pulse Resp SpO2  02/11/23 1955 123/84 98.2 F (36.8 C) Oral 81 12 --  02/11/23 1721 112/73 -- -- 79 -- --  02/11/23 1339 117/77 -- -- 92 -- --  02/11/23 1255 124/81 98.3 F (36.8 C) Oral 82 18 100 %    Constitutional: Well-developed, well-nourished female in no acute distress.  Cardiovascular: normal rate & rhythm, warm and well-perfused Respiratory: normal effort, no problems with respiration noted GI: Abd soft, tender to touch on right. Reports more discomfort in RLQ. Negative for rebound tenderness. Enlarged lymph node in right inguinal space.  MS: Extremities nontender, no edema, normal ROM Neurologic: Alert and oriented x 4.  GU: no CVA tenderness Pelvic: NEFG, copious discharge (swab obtained), no blood, cervix clean. No lesions observed either externally or internally with speculum exam.      Fetal Tracing: Baseline: 145  Variability: moderate Accelerations: 10 x 10 Decelerations: absent Toco: UI   Labs: Results for orders placed or performed during the hospital encounter of 02/11/23 (from the past 24 hours)  Urinalysis, Routine w reflex microscopic -Urine, Clean Catch     Status: Abnormal   Collection Time: 02/11/23 12:50 PM  Result Value Ref Range   Color, Urine YELLOW YELLOW   APPearance HAZY (A) CLEAR   Specific Gravity, Urine 1.012 1.005 - 1.030   pH 7.0 5.0 - 8.0   Glucose, UA NEGATIVE NEGATIVE mg/dL   Hgb urine dipstick NEGATIVE NEGATIVE   Bilirubin Urine NEGATIVE NEGATIVE   Ketones, ur NEGATIVE NEGATIVE mg/dL   Protein, ur NEGATIVE NEGATIVE mg/dL   Nitrite NEGATIVE NEGATIVE   Leukocytes,Ua TRACE (A) NEGATIVE   RBC / HPF 0-5 0 - 5 RBC/hpf   WBC, UA 0-5 0 - 5 WBC/hpf   Bacteria, UA NONE SEEN NONE SEEN   Squamous Epithelial / HPF 6-10 0 - 5 /HPF   Mucus PRESENT   CBC with Differential/Platelet     Status: Abnormal   Collection Time: 02/11/23   1:49 PM  Result Value Ref Range   WBC 6.9 4.0 - 10.5 K/uL   RBC 3.44 (L) 3.87 - 5.11 MIL/uL   Hemoglobin 9.7 (L) 12.0 - 15.0 g/dL   HCT 40.9 (L) 81.1 - 91.4 %   MCV 87.8 80.0 - 100.0 fL   MCH 28.2 26.0 - 34.0 pg   MCHC 32.1 30.0 - 36.0 g/dL   RDW 78.2 95.6 - 21.3 %   Platelets 314 150 - 400 K/uL   nRBC 0.0 0.0 - 0.2 %  Neutrophils Relative % 64 %   Neutro Abs 4.4 1.7 - 7.7 K/uL   Lymphocytes Relative 23 %   Lymphs Abs 1.6 0.7 - 4.0 K/uL   Monocytes Relative 9 %   Monocytes Absolute 0.6 0.1 - 1.0 K/uL   Eosinophils Relative 3 %   Eosinophils Absolute 0.2 0.0 - 0.5 K/uL   Basophils Relative 0 %   Basophils Absolute 0.0 0.0 - 0.1 K/uL   Immature Granulocytes 1 %   Abs Immature Granulocytes 0.04 0.00 - 0.07 K/uL  Comprehensive metabolic panel     Status: Abnormal   Collection Time: 02/11/23  1:49 PM  Result Value Ref Range   Sodium 136 135 - 145 mmol/L   Potassium 4.4 3.5 - 5.1 mmol/L   Chloride 105 98 - 111 mmol/L   CO2 23 22 - 32 mmol/L   Glucose, Bld 82 70 - 99 mg/dL   BUN <5 (L) 6 - 20 mg/dL   Creatinine, Ser 1.30 0.44 - 1.00 mg/dL   Calcium 9.0 8.9 - 86.5 mg/dL   Total Protein 6.9 6.5 - 8.1 g/dL   Albumin 3.2 (L) 3.5 - 5.0 g/dL   AST 13 (L) 15 - 41 U/L   ALT 13 0 - 44 U/L   Alkaline Phosphatase 41 38 - 126 U/L   Total Bilirubin 0.6 0.0 - 1.2 mg/dL   GFR, Estimated >78 >46 mL/min   Anion gap 8 5 - 15  C-reactive protein     Status: None   Collection Time: 02/11/23  1:49 PM  Result Value Ref Range   CRP 0.7 <1.0 mg/dL  Wet prep, genital     Status: Abnormal   Collection Time: 02/11/23  7:19 PM  Result Value Ref Range   Yeast Wet Prep HPF POC NONE SEEN NONE SEEN   Trich, Wet Prep NONE SEEN NONE SEEN   Clue Cells Wet Prep HPF POC NONE SEEN NONE SEEN   WBC, Wet Prep HPF POC >=10 (A) <10   Sperm NONE SEEN     Imaging:  MR ABDOMEN WO CONTRAST Result Date: 02/11/2023 CLINICAL DATA:  33 year old female with 23 leak gestation pregnancy. RIGHT-sided pain. EXAM: MRI  ABDOMEN AND PELVIS WITHOUT CONTRAST TECHNIQUE: Multiplanar multisequence MR imaging of the abdomen and pelvis was performed. No intravenous contrast was administered. COMPARISON:  None Available. FINDINGS: COMBINED FINDINGS FOR BOTH MR ABDOMEN AND PELVIS Lower chest: Trace bilateral effusions. Hepatobiliary: No focal hepatic lesion. Normal gallbladder. No biliary duct dilatation. Common bile duct is normal. Pancreas: Pancreas is normal. No ductal dilatation. No pancreatic inflammation. Spleen: Normal spleen Adrenals/urinary tract: Adrenal glands normal. Kidneys are normal. No hydronephrosis. No hydroureter. Normal bladder Stomach/Bowel: Stomach duodenum small-bowel normal. Terminal ileum normal. Appendix not identified. There is no inflammation in the RIGHT lower quadrant to suggest acute appendicitis. Ascending, transverse and descending colon normal. Rectum normal. Vascular/Lymphatic: Abdominal aorta is normal caliber. No periportal or retroperitoneal adenopathy. No pelvic adenopathy. Reproductive: Gravid uterus. Fetus in breech orientation. Placenta the S within the uterine fundus. Other: No free fluid. Musculoskeletal: No aggressive osseous lesion. IMPRESSION: 1. No evidence acute appendicitis. Appendix not visualized but no secondary signs of appendicitis. 2. No hydronephrosis. 3. Normal gallbladder. 4. Gravid uterus with fetus in breech orientation. Placenta in uterine fundus. Electronically Signed   By: Genevive Bi M.D.   On: 02/11/2023 18:49   MR PELVIS WO CONTRAST Result Date: 02/11/2023 CLINICAL DATA:  33 year old female with 23 leak gestation pregnancy. RIGHT-sided pain. EXAM: MRI ABDOMEN AND PELVIS WITHOUT  CONTRAST TECHNIQUE: Multiplanar multisequence MR imaging of the abdomen and pelvis was performed. No intravenous contrast was administered. COMPARISON:  None Available. FINDINGS: COMBINED FINDINGS FOR BOTH MR ABDOMEN AND PELVIS Lower chest: Trace bilateral effusions. Hepatobiliary: No focal  hepatic lesion. Normal gallbladder. No biliary duct dilatation. Common bile duct is normal. Pancreas: Pancreas is normal. No ductal dilatation. No pancreatic inflammation. Spleen: Normal spleen Adrenals/urinary tract: Adrenal glands normal. Kidneys are normal. No hydronephrosis. No hydroureter. Normal bladder Stomach/Bowel: Stomach duodenum small-bowel normal. Terminal ileum normal. Appendix not identified. There is no inflammation in the RIGHT lower quadrant to suggest acute appendicitis. Ascending, transverse and descending colon normal. Rectum normal. Vascular/Lymphatic: Abdominal aorta is normal caliber. No periportal or retroperitoneal adenopathy. No pelvic adenopathy. Reproductive: Gravid uterus. Fetus in breech orientation. Placenta the S within the uterine fundus. Other: No free fluid. Musculoskeletal: No aggressive osseous lesion. IMPRESSION: 1. No evidence acute appendicitis. Appendix not visualized but no secondary signs of appendicitis. 2. No hydronephrosis. 3. Normal gallbladder. 4. Gravid uterus with fetus in breech orientation. Placenta in uterine fundus. Electronically Signed   By: Genevive Bi M.D.   On: 02/11/2023 18:49    MDM & MAU COURSE  MDM: MRI, CBC, CRP, CMP, UA IV  Pain medication with reassessment of pain  Consulted Dr. Despina Hidden  MAU Course: Orders Placed This Encounter  Procedures   Wet prep, genital   MR ABDOMEN WO CONTRAST   MR PELVIS WO CONTRAST   Urinalysis, Routine w reflex microscopic -Urine, Clean Catch   CBC with Differential/Platelet   Comprehensive metabolic panel   C-reactive protein   CBC with Differential/Platelet   Notify physician (specify)   Vital signs   Defer vaginal exam for vaginal bleeding or PROM <37 weeks   Apply Antepartum Care Plan   Initiate Oral Care Protocol   Initiate Carrier Fluid Protocol   SCDs   Informed Consent Details: Physician/Practitioner Attestation; Transcribe to consent form and obtain patient signature   Full code    Type and screen [facility]   Insert peripheral IV   Place in observation (patient's expected length of stay will be less than 2 midnights)   Meds ordered this encounter  Medications   fentaNYL (SUBLIMAZE) injection 100 mcg   promethazine (PHENERGAN) 25 mg in sodium chloride 0.9 % 50 mL IVPB   lactated ringers infusion   cyclobenzaprine (FLEXERIL) tablet 10 mg   lactated ringers infusion   acetaminophen (TYLENOL) tablet 650 mg   docusate sodium (COLACE) capsule 100 mg   calcium carbonate (TUMS - dosed in mg elemental calcium) chewable tablet 400 mg of elemental calcium   prenatal multivitamin tablet 1 tablet   fentaNYL (SUBLIMAZE) injection 50 mcg    ASSESSMENT   1. Abdominal pain affecting pregnancy    - Labs and imaging benign. - PE significant for acute abdominal pain on right side with epicenter of pain in RLQ. Inguinal lymph nodes enlarged on right side as well.   PLAN  - Imaging to rule out appendicitis. Findings benign. - Labs ordered. - Trial of fentanyl for pain relief, minimal relief. HA improved. Trial of phenergan, minimal relief. Trial of flexeril, relief minimal. - Given negative findings, with acute pain, unclear cause, plan to admit to [facility] per Dr. Despina Hidden for observation.   Admit to [facility] under care of Dr. Despina Hidden.   Lamont Snowball, MSN, CNM 02/11/2023 7:59 PM  Certified Nurse Midwife, [facility]

## 2023-02-12 ENCOUNTER — Ambulatory Visit: Payer: Medicaid Other

## 2023-02-12 ENCOUNTER — Ambulatory Visit: Payer: Medicaid Other | Attending: Obstetrics and Gynecology

## 2023-02-12 ENCOUNTER — Observation Stay (HOSPITAL_COMMUNITY): Payer: Medicaid Other

## 2023-02-12 DIAGNOSIS — M5116 Intervertebral disc disorders with radiculopathy, lumbar region: Secondary | ICD-10-CM | POA: Diagnosis not present

## 2023-02-12 DIAGNOSIS — O99012 Anemia complicating pregnancy, second trimester: Secondary | ICD-10-CM | POA: Diagnosis not present

## 2023-02-12 DIAGNOSIS — Z3A24 24 weeks gestation of pregnancy: Secondary | ICD-10-CM | POA: Diagnosis not present

## 2023-02-12 DIAGNOSIS — Z148 Genetic carrier of other disease: Secondary | ICD-10-CM | POA: Diagnosis not present

## 2023-02-12 DIAGNOSIS — M79604 Pain in right leg: Secondary | ICD-10-CM | POA: Diagnosis not present

## 2023-02-12 DIAGNOSIS — O368320 Maternal care for abnormalities of the fetal heart rate or rhythm, second trimester, not applicable or unspecified: Secondary | ICD-10-CM | POA: Diagnosis not present

## 2023-02-12 DIAGNOSIS — O212 Late vomiting of pregnancy: Secondary | ICD-10-CM

## 2023-02-12 DIAGNOSIS — O09292 Supervision of pregnancy with other poor reproductive or obstetric history, second trimester: Secondary | ICD-10-CM

## 2023-02-12 DIAGNOSIS — O26892 Other specified pregnancy related conditions, second trimester: Secondary | ICD-10-CM | POA: Diagnosis not present

## 2023-02-12 DIAGNOSIS — O09212 Supervision of pregnancy with history of pre-term labor, second trimester: Secondary | ICD-10-CM

## 2023-02-12 DIAGNOSIS — R599 Enlarged lymph nodes, unspecified: Secondary | ICD-10-CM | POA: Diagnosis not present

## 2023-02-12 DIAGNOSIS — J45909 Unspecified asthma, uncomplicated: Secondary | ICD-10-CM | POA: Diagnosis not present

## 2023-02-12 DIAGNOSIS — Z3A23 23 weeks gestation of pregnancy: Secondary | ICD-10-CM | POA: Diagnosis not present

## 2023-02-12 DIAGNOSIS — R262 Difficulty in walking, not elsewhere classified: Secondary | ICD-10-CM | POA: Diagnosis not present

## 2023-02-12 DIAGNOSIS — R1033 Periumbilical pain: Secondary | ICD-10-CM | POA: Diagnosis not present

## 2023-02-12 DIAGNOSIS — R519 Headache, unspecified: Secondary | ICD-10-CM | POA: Diagnosis not present

## 2023-02-12 DIAGNOSIS — R531 Weakness: Secondary | ICD-10-CM | POA: Diagnosis not present

## 2023-02-12 DIAGNOSIS — Z8249 Family history of ischemic heart disease and other diseases of the circulatory system: Secondary | ICD-10-CM | POA: Diagnosis not present

## 2023-02-12 DIAGNOSIS — O2242 Hemorrhoids in pregnancy, second trimester: Secondary | ICD-10-CM | POA: Diagnosis not present

## 2023-02-12 DIAGNOSIS — K5900 Constipation, unspecified: Secondary | ICD-10-CM | POA: Diagnosis not present

## 2023-02-12 DIAGNOSIS — M79606 Pain in leg, unspecified: Secondary | ICD-10-CM | POA: Diagnosis not present

## 2023-02-12 DIAGNOSIS — Z833 Family history of diabetes mellitus: Secondary | ICD-10-CM | POA: Diagnosis not present

## 2023-02-12 DIAGNOSIS — D563 Thalassemia minor: Secondary | ICD-10-CM | POA: Diagnosis not present

## 2023-02-12 DIAGNOSIS — O26899 Other specified pregnancy related conditions, unspecified trimester: Secondary | ICD-10-CM | POA: Diagnosis not present

## 2023-02-12 DIAGNOSIS — O99512 Diseases of the respiratory system complicating pregnancy, second trimester: Secondary | ICD-10-CM | POA: Diagnosis not present

## 2023-02-12 DIAGNOSIS — Z87891 Personal history of nicotine dependence: Secondary | ICD-10-CM | POA: Diagnosis not present

## 2023-02-12 DIAGNOSIS — R109 Unspecified abdominal pain: Secondary | ICD-10-CM | POA: Diagnosis not present

## 2023-02-12 DIAGNOSIS — O402XX0 Polyhydramnios, second trimester, not applicable or unspecified: Secondary | ICD-10-CM | POA: Diagnosis not present

## 2023-02-12 DIAGNOSIS — O26893 Other specified pregnancy related conditions, third trimester: Secondary | ICD-10-CM | POA: Diagnosis not present

## 2023-02-12 LAB — AMYLASE: Amylase: 92 U/L (ref 28–100)

## 2023-02-12 LAB — CBC WITH DIFFERENTIAL/PLATELET
Abs Immature Granulocytes: 0.04 10*3/uL (ref 0.00–0.07)
Basophils Absolute: 0 10*3/uL (ref 0.0–0.1)
Basophils Relative: 0 %
Eosinophils Absolute: 0.2 10*3/uL (ref 0.0–0.5)
Eosinophils Relative: 3 %
HCT: 26.8 % — ABNORMAL LOW (ref 36.0–46.0)
Hemoglobin: 8.9 g/dL — ABNORMAL LOW (ref 12.0–15.0)
Immature Granulocytes: 1 %
Lymphocytes Relative: 24 %
Lymphs Abs: 1.6 10*3/uL (ref 0.7–4.0)
MCH: 28.6 pg (ref 26.0–34.0)
MCHC: 33.2 g/dL (ref 30.0–36.0)
MCV: 86.2 fL (ref 80.0–100.0)
Monocytes Absolute: 0.6 10*3/uL (ref 0.1–1.0)
Monocytes Relative: 8 %
Neutro Abs: 4.4 10*3/uL (ref 1.7–7.7)
Neutrophils Relative %: 64 %
Platelets: 260 10*3/uL (ref 150–400)
RBC: 3.11 MIL/uL — ABNORMAL LOW (ref 3.87–5.11)
RDW: 13.8 % (ref 11.5–15.5)
WBC: 6.9 10*3/uL (ref 4.0–10.5)
nRBC: 0 % (ref 0.0–0.2)

## 2023-02-12 LAB — IRON AND TIBC
Iron: 52 ug/dL (ref 28–170)
Saturation Ratios: 9 % — ABNORMAL LOW (ref 10.4–31.8)
TIBC: 580 ug/dL — ABNORMAL HIGH (ref 250–450)
UIBC: 528 ug/dL

## 2023-02-12 LAB — RETICULOCYTES
Immature Retic Fract: 20 % — ABNORMAL HIGH (ref 2.3–15.9)
RBC.: 3.09 MIL/uL — ABNORMAL LOW (ref 3.87–5.11)
Retic Count, Absolute: 70.1 10*3/uL (ref 19.0–186.0)
Retic Ct Pct: 2.3 % (ref 0.4–3.1)

## 2023-02-12 LAB — VITAMIN B12: Vitamin B-12: 209 pg/mL (ref 180–914)

## 2023-02-12 LAB — FOLATE: Folate: 22.8 ng/mL (ref >5.9)

## 2023-02-12 LAB — FERRITIN: Ferritin: 11 ng/mL (ref 11–307)

## 2023-02-12 LAB — LIPASE, BLOOD: Lipase: 40 U/L (ref 11–51)

## 2023-02-12 MED ORDER — PROMETHAZINE HCL 25 MG PO TABS
25.0000 mg | ORAL_TABLET | Freq: Four times a day (QID) | ORAL | Status: DC | PRN
  Administered 2023-02-12 – 2023-02-14 (×2): 25 mg via ORAL
  Filled 2023-02-12 (×2): qty 1

## 2023-02-12 MED ORDER — HYDROCODONE-ACETAMINOPHEN 7.5-325 MG PO TABS: 1.0000 | ORAL_TABLET | Freq: Four times a day (QID) | ORAL | Status: DC | PRN

## 2023-02-12 MED ORDER — SODIUM CHLORIDE 0.9 % IV SOLN
500.0000 mg | Freq: Once | INTRAVENOUS | Status: AC
  Administered 2023-02-12: 500 mg via INTRAVENOUS
  Filled 2023-02-12: qty 25

## 2023-02-12 MED ORDER — OXYCODONE HCL 5 MG PO TABS
5.0000 mg | ORAL_TABLET | Freq: Four times a day (QID) | ORAL | Status: DC | PRN
  Administered 2023-02-12 – 2023-02-17 (×14): 10 mg via ORAL
  Administered 2023-02-17: 5 mg via ORAL
  Administered 2023-02-17 – 2023-02-18 (×2): 10 mg via ORAL
  Filled 2023-02-12 (×15): qty 2
  Filled 2023-02-12: qty 1
  Filled 2023-02-12 (×3): qty 2

## 2023-02-12 MED ORDER — LACTATED RINGERS IV BOLUS
1000.0000 mL | Freq: Once | INTRAVENOUS | Status: AC
  Administered 2023-02-12: 1000 mL via INTRAVENOUS

## 2023-02-12 MED ORDER — BUTALBITAL-APAP-CAFFEINE 50-325-40 MG PO TABS
1.0000 | ORAL_TABLET | Freq: Once | ORAL | Status: AC
  Administered 2023-02-12: 1 via ORAL
  Filled 2023-02-12: qty 1

## 2023-02-12 MED ORDER — FAMOTIDINE 20 MG PO TABS
20.0000 mg | ORAL_TABLET | Freq: Two times a day (BID) | ORAL | Status: DC
  Administered 2023-02-12 – 2023-02-18 (×11): 20 mg via ORAL
  Filled 2023-02-12 (×13): qty 1

## 2023-02-12 MED ORDER — DOXYLAMINE SUCCINATE (SLEEP) 25 MG PO TABS
25.0000 mg | ORAL_TABLET | Freq: Every evening | ORAL | Status: DC | PRN
  Administered 2023-02-12 – 2023-02-17 (×5): 25 mg via ORAL
  Filled 2023-02-12 (×5): qty 1

## 2023-02-12 MED ORDER — ENOXAPARIN SODIUM 40 MG/0.4ML IJ SOSY
40.0000 mg | PREFILLED_SYRINGE | INTRAMUSCULAR | Status: DC
  Administered 2023-02-12 – 2023-02-17 (×6): 40 mg via SUBCUTANEOUS
  Filled 2023-02-12 (×6): qty 0.4

## 2023-02-12 NOTE — Progress Notes (Signed)
OB Note Patient in ultrasound. Will come back later  Cornelia Copa MD Attending Center for [facility]) GYN Consult Phone: [phone] (M-F, [phone]) & [phone] (Off hours, weekends, holidays)

## 2023-02-12 NOTE — Progress Notes (Signed)
RN attempted to place pt on EFM. Baby is positioned on side of abdomen where MOB is experiencing large amounts of pain/discomfort. RN to defer EFM at this time until baby moves to another side and/or pt pain subsides.

## 2023-02-12 NOTE — Progress Notes (Signed)
Daily Antepartum Note  Admission Date: 02/11/2023 Current Date: 02/12/2023 12:10 PM  Heather Malone is a 33 y.o. G3P1102 at [redacted]w[redacted]d, admitted for pain.  Pregnancy complicated by: Patient Active Problem List   Diagnosis Date Noted   Abdominal pain affecting pregnancy 02/11/2023   Hyperemesis 12/10/2022   Supervision of high-risk pregnancy 10/29/2022   History of severe pre-eclampsia 10/29/2022   Elevated blood pressure complicating pregnancy in first trimester, antepartum 10/01/2022   Alpha thalassemia silent carrier 08/02/2020   Chronic headaches 04/12/2020   Subchorionic hematoma 07/26/2014    Overnight/24hr events:  See h&p. Pt just back from ultrasound  Subjective:  Still no GI or OB s/s. Still has peri-umbilical and right inguinal pain that makes it hard to walk  Objective:    Current Vital Signs 24h Vital Sign Ranges  T 98.1 F (36.7 C) Temp  Avg: 98.2 F (36.8 C)  Min: 98.1 F (36.7 C)  Max: 98.3 F (36.8 C)  BP 123/84 BP  Min: 112/73  Max: 124/81  HR 81 Pulse  Avg: 83.5  Min: 79  Max: 92  RR 12 Resp  Avg: 15  Min: 12  Max: 18  SaO2 100 % Room Air SpO2  Avg: 100 %  Min: 100 %  Max: 100 %       24 Hour I/O Current Shift I/O  Time Ins Outs No intake/output data recorded. No intake/output data recorded.    Physical exam: General: Well nourished, well developed female in no acute distress. Abdomen: gravid, normal/gravid umbilical hernia seen with a multip, measures 2cm. Patient is diffusely ttp around the umbilicus, skin appears wnl, no peritoneal s/s. Ttp in the right inguinal ligament, ?1.5cm induration there, contralateral side wnl Cardiovascular: S1, S2 normal, no murmur, rub or gallop, regular rate and rhythm Respiratory: CTAB Back: no CVAT Extremities: no clubbing, cyanosis or edema Skin: Warm and dry.   Medications: Current Facility-Administered Medications  Medication Dose Route Frequency Provider Last Rate Last Admin   acetaminophen (TYLENOL) tablet  650 mg  650 mg Oral Q4H PRN Richardson Landry, CNM   650 mg at 02/12/23 1914   butalbital-acetaminophen-caffeine (FIORICET) 50-325-40 MG per tablet 1 tablet  1 tablet Oral Once [place] Bing, MD       calcium carbonate (TUMS - dosed in mg elemental calcium) chewable tablet 400 mg of elemental calcium  2 tablet Oral Q4H PRN Lamont Snowball A, CNM       docusate sodium (COLACE) capsule 100 mg  100 mg Oral Daily [place], Sara A, CNM   100 mg at 02/11/23 2105   enoxaparin (LOVENOX) injection 40 mg  40 mg Subcutaneous Q24H [place] Bing, MD       fentaNYL (SUBLIMAZE) injection 50 mcg  50 mcg Intravenous Q3H PRN Warren-Hill, Sara A, CNM   50 mcg at 02/12/23 1128   iron sucrose (VENOFER) 500 mg in sodium chloride 0.9 % 250 mL IVPB  500 mg Intravenous Once [place] Bing, MD       lactated ringers infusion   Intravenous Continuous Warren-Hill, Clarita Crane, CNM   Stopped at 02/11/23 1554   lactated ringers infusion  125 mL/hr Intravenous On Call to OR Warren-Hill, Clarita Crane, CNM       prenatal multivitamin tablet 1 tablet  1 tablet Oral Q1200 Richardson Landry, CNM   1 tablet at 02/12/23 1128    Labs:  Recent Labs  Lab 02/11/23 1349 02/12/23 0501  WBC 6.9 6.9  HGB 9.7* 8.9*  HCT 30.2* 26.8*  PLT 314 260    Recent Labs  Lab 02/11/23 1349  NA 136  K 4.4  CL 105  CO2 23  BUN <5*  CREATININE 0.44  CALCIUM 9.0  PROT 6.9  BILITOT 0.6  ALKPHOS 41  ALT 13  AST 13*  GLUCOSE 82   Negative: wet prep, admit amylase and lipase  Radiology:  No new imaging  Assessment & Plan:  Patient stable *Pregnancy: daily NSTs ordered *Abdominal pain, right inguinal pain: unsure etiology. Add on labs wnl and imaging reassuring and no GI/OB/renal etiology. Formal OB u/s ordered. Pt desiring to eat and said this is fine. *Chronic HAs: pt asking for fioricet. Will give one dose *Preterm: no current concerns *PPx: pt amenable to lovenox *FEN/GI: see above. Saline lock IV *Dispo: follow up for  improving s/s.   Cornelia Copa MD Attending Center for [facility] GYN Consult Phone: [phone] (M-F, [phone]) & [phone]  (Off hours, weekends, holidays)

## 2023-02-12 NOTE — H&P (Signed)
Preoperative History and Physical  Heather Malone is a 33 y.o. [id number] Patient's last menstrual period was 08/18/2022 (exact date). admitted for a periumbilical pain, worse just below with painful right inguinal lymphadenopathy.Marland Kitchen  MRI is normal does not show hernia specifically  No evidence of HSV at present WBC 6.9 No GI symptoms, no N/V/D Exam significant for the tenderness around the umbilicus and the tender shotty adenopathy on in the right inguinal area  PMH:    Past Medical History:  Diagnosis Date   Anemia    Anxiety    Asthma    albuterol prn   Headache    migraines   Hx of trichomoniasis    Hx of varicella    Neck injury, subsequent encounter 07/26/2016   Placental abruption in third trimester 09/04/2014   Pre-eclampsia    Pregnancy induced hypertension    Subchorionic hemorrhage in third trimester 07/26/2014   Velamentous insertion of umbilical cord     PSH:     Past Surgical History:  Procedure Laterality Date   TONSILLECTOMY      POb/GynH:      OB History     Gravida  3   Para  2   Term  1   Preterm  1   AB  0   Living  2      SAB  0   IAB  0   Ectopic  0   Multiple  0   Live Births  2           SH:   Social History   Tobacco Use   Smoking status: Former    Types: Cigars   Smokeless tobacco: Never  Vaping Use   Vaping status: Never Used  Substance Use Topics   Alcohol use: Not Currently    Comment: weekends   Drug use: Not Currently    Types: Marijuana    Comment: none with + UPT    FH:    Family History  Problem Relation Age of Onset   Hypertension Mother    Hypertension Maternal Grandmother    Heart attack Maternal Grandmother    Hypertension Maternal Grandfather    Cancer Maternal Grandfather    Diabetes Paternal Grandmother      Allergies: No Known Allergies  Medications:       Current Facility-Administered Medications:    acetaminophen (TYLENOL) tablet 650 mg, 650 mg, Oral, Q4H PRN,  Warren-Hill, Sara A, CNM   calcium carbonate (TUMS - dosed in mg elemental calcium) chewable tablet 400 mg of elemental calcium, 2 tablet, Oral, Q4H PRN, Warren-Hill, Sara A, CNM   docusate sodium (COLACE) capsule 100 mg, 100 mg, Oral, Daily, Warren-Hill, Sara A, CNM, 100 mg at 02/11/23 2105   fentaNYL (SUBLIMAZE) injection 50 mcg, 50 mcg, Intravenous, Q3H PRN, Warren-Hill, Sara A, CNM, 50 mcg at 02/12/23 0117   lactated ringers infusion, , Intravenous, Continuous, Warren-Hill, Clarita Crane, CNM, Stopped at 02/11/23 1554   lactated ringers infusion, 125 mL/hr, Intravenous, On Call to OR, Warren-Hill, Clarita Crane, CNM   prenatal multivitamin tablet 1 tablet, 1 tablet, Oral, Q1200, Warren-Hill, Sara A, CNM  Review of Systems:   Review of Systems  Constitutional: Negative for fever, chills, weight loss, malaise/fatigue and diaphoresis.  HENT: Negative for hearing loss, ear pain, nosebleeds, congestion, sore throat, neck pain, tinnitus and ear discharge.   Eyes: Negative for blurred vision, double vision, photophobia, pain, discharge and redness.  Respiratory: Negative for cough, hemoptysis, sputum production, shortness of breath,  wheezing and stridor.   Cardiovascular: Negative for chest pain, palpitations, orthopnea, claudication, leg swelling and PND.  Gastrointestinal: Positive for abdominal pain. Negative for heartburn, nausea, vomiting, diarrhea, constipation, blood in stool and melena.  Genitourinary: Negative for dysuria, urgency, frequency, hematuria and flank pain.  Musculoskeletal: Negative for myalgias, back pain, joint pain and falls.  Skin: Negative for itching and rash.  Neurological: Negative for dizziness, tingling, tremors, sensory change, speech change, focal weakness, seizures, loss of consciousness, weakness and headaches.  Endo/Heme/Allergies: Negative for environmental allergies and polydipsia. Does not bruise/bleed easily.  Psychiatric/Behavioral: Negative for depression, suicidal  ideas, hallucinations, memory loss and substance abuse. The patient is not nervous/anxious and does not have insomnia.      PHYSICAL EXAM:  Blood pressure 123/84, pulse 81, temperature 98.1 F (36.7 C), resp. rate 12, last menstrual period 08/18/2022, SpO2 100%.    Vitals reviewed. Constitutional: She is oriented to person, place, and time. She appears well-developed and well-nourished.  HENT:  Head: Normocephalic and atraumatic.  Right Ear: External ear normal.  Left Ear: External ear normal.  Nose: Nose normal.  Mouth/Throat: Oropharynx is clear and moist.  Eyes: Conjunctivae and EOM are normal. Pupils are equal, round, and reactive to light. Right eye exhibits no discharge. Left eye exhibits no discharge. No scleral icterus.  Neck: Normal range of motion. Neck supple. No tracheal deviation present. No thyromegaly present.  Cardiovascular: Normal rate, regular rhythm, normal heart sounds and intact distal pulses.  Exam reveals no gallop and no friction rub.   No murmur heard. Respiratory: Effort normal and breath sounds normal. No respiratory distress. She has no wheezes. She has no rales. She exhibits no tenderness.  GI: Soft. Bowel sounds are normal. S=D, no rebound There is tenderness. In the periumbilical region and that is where pt points her pain is the worst.There is no rebound and no guarding.  Genitourinary:       Vulva is normal without lesions Vagina is pink moist without discharge specifically no HSV Cervix normal in appearance and pap is normal Uterus is gravid Adnexa is negative with normal sized ovaries by sonogram  Musculoskeletal: Normal range of motion. She exhibits no edema and no tenderness.  Neurological: She is alert and oriented to person, place, and time. She has normal reflexes. She displays normal reflexes. No cranial nerve deficit. She exhibits normal muscle tone. Coordination normal.  Skin: Skin is warm and dry. No rash noted. No erythema. No pallor.   Psychiatric: She has a normal mood and affect. Her behavior is normal. Judgment and thought content normal.    Labs: Results for orders placed or performed during the hospital encounter of 02/11/23 (from the past 2 weeks)  Urinalysis, Routine w reflex microscopic -Urine, Clean Catch   Collection Time: 02/11/23 12:50 PM  Result Value Ref Range   Color, Urine YELLOW YELLOW   APPearance HAZY (A) CLEAR   Specific Gravity, Urine 1.012 1.005 - 1.030   pH 7.0 5.0 - 8.0   Glucose, UA NEGATIVE NEGATIVE mg/dL   Hgb urine dipstick NEGATIVE NEGATIVE   Bilirubin Urine NEGATIVE NEGATIVE   Ketones, ur NEGATIVE NEGATIVE mg/dL   Protein, ur NEGATIVE NEGATIVE mg/dL   Nitrite NEGATIVE NEGATIVE   Leukocytes,Ua TRACE (A) NEGATIVE   RBC / HPF 0-5 0 - 5 RBC/hpf   WBC, UA 0-5 0 - 5 WBC/hpf   Bacteria, UA NONE SEEN NONE SEEN   Squamous Epithelial / HPF 6-10 0 - 5 /HPF   Mucus PRESENT  CBC with Differential/Platelet   Collection Time: 02/11/23  1:49 PM  Result Value Ref Range   WBC 6.9 4.0 - 10.5 K/uL   RBC 3.44 (L) 3.87 - 5.11 MIL/uL   Hemoglobin 9.7 (L) 12.0 - 15.0 g/dL   HCT 54.0 (L) 98.1 - 19.1 %   MCV 87.8 80.0 - 100.0 fL   MCH 28.2 26.0 - 34.0 pg   MCHC 32.1 30.0 - 36.0 g/dL   RDW 47.8 29.5 - 62.1 %   Platelets 314 150 - 400 K/uL   nRBC 0.0 0.0 - 0.2 %   Neutrophils Relative % 64 %   Neutro Abs 4.4 1.7 - 7.7 K/uL   Lymphocytes Relative 23 %   Lymphs Abs 1.6 0.7 - 4.0 K/uL   Monocytes Relative 9 %   Monocytes Absolute 0.6 0.1 - 1.0 K/uL   Eosinophils Relative 3 %   Eosinophils Absolute 0.2 0.0 - 0.5 K/uL   Basophils Relative 0 %   Basophils Absolute 0.0 0.0 - 0.1 K/uL   Immature Granulocytes 1 %   Abs Immature Granulocytes 0.04 0.00 - 0.07 K/uL  Comprehensive metabolic panel   Collection Time: 02/11/23  1:49 PM  Result Value Ref Range   Sodium 136 135 - 145 mmol/L   Potassium 4.4 3.5 - 5.1 mmol/L   Chloride 105 98 - 111 mmol/L   CO2 23 22 - 32 mmol/L   Glucose, Bld 82 70 - 99  mg/dL   BUN <5 (L) 6 - 20 mg/dL   Creatinine, Ser 3.08 0.44 - 1.00 mg/dL   Calcium 9.0 8.9 - 65.7 mg/dL   Total Protein 6.9 6.5 - 8.1 g/dL   Albumin 3.2 (L) 3.5 - 5.0 g/dL   AST 13 (L) 15 - 41 U/L   ALT 13 0 - 44 U/L   Alkaline Phosphatase 41 38 - 126 U/L   Total Bilirubin 0.6 0.0 - 1.2 mg/dL   GFR, Estimated >84 >69 mL/min   Anion gap 8 5 - 15  C-reactive protein   Collection Time: 02/11/23  1:49 PM  Result Value Ref Range   CRP 0.7 <1.0 mg/dL  Wet prep, genital   Collection Time: 02/11/23  7:19 PM  Result Value Ref Range   Yeast Wet Prep HPF POC NONE SEEN NONE SEEN   Trich, Wet Prep NONE SEEN NONE SEEN   Clue Cells Wet Prep HPF POC NONE SEEN NONE SEEN   WBC, Wet Prep HPF POC >=10 (A) <10   Sperm NONE SEEN   Type and screen [facility]   Collection Time: 02/11/23  8:06 PM  Result Value Ref Range   ABO/RH(D) B POS    Antibody Screen NEG    Sample Expiration      02/14/2023,2359 Performed at [facility], [address] [place], [place] [address]   CBC with Differential/Platelet   Collection Time: 02/12/23  5:01 AM  Result Value Ref Range   WBC 6.9 4.0 - 10.5 K/uL   RBC 3.11 (L) 3.87 - 5.11 MIL/uL   Hemoglobin 8.9 (L) 12.0 - 15.0 g/dL   HCT 84.1 (L) 32.4 - 40.1 %   MCV 86.2 80.0 - 100.0 fL   MCH 28.6 26.0 - 34.0 pg   MCHC 33.2 30.0 - 36.0 g/dL   RDW 02.7 25.3 - 66.4 %   Platelets 260 150 - 400 K/uL   nRBC 0.0 0.0 - 0.2 %   Neutrophils Relative % 64 %   Neutro Abs 4.4  1.7 - 7.7 K/uL   Lymphocytes Relative 24 %   Lymphs Abs 1.6 0.7 - 4.0 K/uL   Monocytes Relative 8 %   Monocytes Absolute 0.6 0.1 - 1.0 K/uL   Eosinophils Relative 3 %   Eosinophils Absolute 0.2 0.0 - 0.5 K/uL   Basophils Relative 0 %   Basophils Absolute 0.0 0.0 - 0.1 K/uL   Immature Granulocytes 1 %   Abs Immature Granulocytes 0.04 0.00 - 0.07 K/uL  Results for orders placed or performed during the hospital encounter of 02/04/23 (from the past 2 weeks)  Urinalysis,  Routine w reflex microscopic -Urine, Clean Catch   Collection Time: 02/04/23  5:54 PM  Result Value Ref Range   Color, Urine YELLOW YELLOW   APPearance CLOUDY (A) CLEAR   Specific Gravity, Urine 1.016 1.005 - 1.030   pH 6.0 5.0 - 8.0   Glucose, UA NEGATIVE NEGATIVE mg/dL   Hgb urine dipstick NEGATIVE NEGATIVE   Bilirubin Urine NEGATIVE NEGATIVE   Ketones, ur NEGATIVE NEGATIVE mg/dL   Protein, ur NEGATIVE NEGATIVE mg/dL   Nitrite NEGATIVE NEGATIVE   Leukocytes,Ua LARGE (A) NEGATIVE   RBC / HPF 0-5 0 - 5 RBC/hpf   WBC, UA 0-5 0 - 5 WBC/hpf   Bacteria, UA RARE (A) NONE SEEN   Squamous Epithelial / HPF 21-50 0 - 5 /HPF   Mucus PRESENT    Amorphous Crystal PRESENT   CBC   Collection Time: 02/04/23  8:25 PM  Result Value Ref Range   WBC 9.0 4.0 - 10.5 K/uL   RBC 3.37 (L) 3.87 - 5.11 MIL/uL   Hemoglobin 9.4 (L) 12.0 - 15.0 g/dL   HCT 14.7 (L) 82.9 - 56.2 %   MCV 86.9 80.0 - 100.0 fL   MCH 27.9 26.0 - 34.0 pg   MCHC 32.1 30.0 - 36.0 g/dL   RDW 13.0 86.5 - 78.4 %   Platelets 296 150 - 400 K/uL   nRBC 0.0 0.0 - 0.2 %  Comprehensive metabolic panel   Collection Time: 02/04/23  8:25 PM  Result Value Ref Range   Sodium 136 135 - 145 mmol/L   Potassium 4.3 3.5 - 5.1 mmol/L   Chloride 104 98 - 111 mmol/L   CO2 22 22 - 32 mmol/L   Glucose, Bld 81 70 - 99 mg/dL   BUN 6 6 - 20 mg/dL   Creatinine, Ser 6.96 (L) 0.44 - 1.00 mg/dL   Calcium 9.2 8.9 - 29.5 mg/dL   Total Protein 6.8 6.5 - 8.1 g/dL   Albumin 3.1 (L) 3.5 - 5.0 g/dL   AST 16 15 - 41 U/L   ALT 14 0 - 44 U/L   Alkaline Phosphatase 40 38 - 126 U/L   Total Bilirubin 0.4 0.0 - 1.2 mg/dL   GFR, Estimated >28 >41 mL/min   Anion gap 10 5 - 15  Results for orders placed or performed in visit on 01/29/23 (from the past 2 weeks)  AFP, Serum, Open Spina Bifida   Collection Time: 01/29/23 12:43 PM  Result Value Ref Range   Results Report    Test Results: *Screen Negative*    Gest. Age on Collection Date 22.0 weeks   Gestat. Age  Based On Ultrasound    [age]D [age]   Race Black    Weight 167 lbs   Insulin Dep Diabetes No    Multiple Gestation No    AFP Value 58.9 ng/mL   AFP MoM 0.79  OSBR Risk 1 IN 10,000    Interpretation Comment    Comment: Comment     EKG: Orders placed or performed during the hospital encounter of 09/18/20   ED EKG   ED EKG    Imaging Studies: MR ABDOMEN WO CONTRAST Result Date: 02/11/2023 CLINICAL DATA:  33 year old female with 23 leak gestation pregnancy. RIGHT-sided pain. EXAM: MRI ABDOMEN AND PELVIS WITHOUT CONTRAST TECHNIQUE: Multiplanar multisequence MR imaging of the abdomen and pelvis was performed. No intravenous contrast was administered. COMPARISON:  None Available. FINDINGS: COMBINED FINDINGS FOR BOTH MR ABDOMEN AND PELVIS Lower chest: Trace bilateral effusions. Hepatobiliary: No focal hepatic lesion. Normal gallbladder. No biliary duct dilatation. Common bile duct is normal. Pancreas: Pancreas is normal. No ductal dilatation. No pancreatic inflammation. Spleen: Normal spleen Adrenals/urinary tract: Adrenal glands normal. Kidneys are normal. No hydronephrosis. No hydroureter. Normal bladder Stomach/Bowel: Stomach duodenum small-bowel normal. Terminal ileum normal. Appendix not identified. There is no inflammation in the RIGHT lower quadrant to suggest acute appendicitis. Ascending, transverse and descending colon normal. Rectum normal. Vascular/Lymphatic: Abdominal aorta is normal caliber. No periportal or retroperitoneal adenopathy. No pelvic adenopathy. Reproductive: Gravid uterus. Fetus in breech orientation. Placenta the S within the uterine fundus. Other: No free fluid. Musculoskeletal: No aggressive osseous lesion. IMPRESSION: 1. No evidence acute appendicitis. Appendix not visualized but no secondary signs of appendicitis. 2. No hydronephrosis. 3. Normal gallbladder. 4. Gravid uterus with fetus in breech orientation. Placenta in uterine fundus. Electronically  Signed   By: Genevive Bi M.D.   On: 02/11/2023 18:49   MR PELVIS WO CONTRAST Result Date: 02/11/2023 CLINICAL DATA:  33 year old female with 23 leak gestation pregnancy. RIGHT-sided pain. EXAM: MRI ABDOMEN AND PELVIS WITHOUT CONTRAST TECHNIQUE: Multiplanar multisequence MR imaging of the abdomen and pelvis was performed. No intravenous contrast was administered. COMPARISON:  None Available. FINDINGS: COMBINED FINDINGS FOR BOTH MR ABDOMEN AND PELVIS Lower chest: Trace bilateral effusions. Hepatobiliary: No focal hepatic lesion. Normal gallbladder. No biliary duct dilatation. Common bile duct is normal. Pancreas: Pancreas is normal. No ductal dilatation. No pancreatic inflammation. Spleen: Normal spleen Adrenals/urinary tract: Adrenal glands normal. Kidneys are normal. No hydronephrosis. No hydroureter. Normal bladder Stomach/Bowel: Stomach duodenum small-bowel normal. Terminal ileum normal. Appendix not identified. There is no inflammation in the RIGHT lower quadrant to suggest acute appendicitis. Ascending, transverse and descending colon normal. Rectum normal. Vascular/Lymphatic: Abdominal aorta is normal caliber. No periportal or retroperitoneal adenopathy. No pelvic adenopathy. Reproductive: Gravid uterus. Fetus in breech orientation. Placenta the S within the uterine fundus. Other: No free fluid. Musculoskeletal: No aggressive osseous lesion. IMPRESSION: 1. No evidence acute appendicitis. Appendix not visualized but no secondary signs of appendicitis. 2. No hydronephrosis. 3. Normal gallbladder. 4. Gravid uterus with fetus in breech orientation. Placenta in uterine fundus. Electronically Signed   By: Genevive Bi M.D.   On: 02/11/2023 18:49      Assessment: [redacted]w[redacted]d [id number] Estimated Date of Delivery: 06/04/23  Periumbilical pain uncertain etiology, suspicious for maybe omentum getting trapped but the MRI did not show any fat plane disruption to confirm Tender right inguinal lymphadenopathy, no  HSV outbreak is seen  Plan: Admit for observation and see if any symptoms progress or improve, keep NPO Recheck WBC in am  Lazaro Arms

## 2023-02-12 NOTE — Progress Notes (Signed)
   PRENATAL VISIT NOTE  Subjective:  Heather Malone is a 33 y.o. [id number] at [redacted]w[redacted]d being seen today for ongoing prenatal care.  She is currently monitored for the following issues for this high-risk pregnancy and has Subchorionic hematoma; Chronic headaches; Alpha thalassemia silent carrier; Elevated blood pressure complicating pregnancy in first trimester, antepartum; Supervision of high-risk pregnancy; History of severe pre-eclampsia; Hyperemesis; Abdominal pain affecting pregnancy; and Abdominal pain in pregnancy, second trimester on their problem list.  Patient reports  continued headache unresponsive to prescribed meds (Tylenol, gabapentin and flexeril. Has also noted increased nausea/vomiting over the past few days despite IV infusions which had been controlling her hyperemesis well. Most concerning to her is a painful lump in her groin and accompanying belly pain. It is making it difficult to walk, very painful and has her in tears .  Contractions: Not present. Vag. Bleeding: None.  Movement: Present. Denies leaking of fluid.   The following portions of the patient's history were reviewed and updated as appropriate: allergies, current medications, past family history, past medical history, past social history, past surgical history and problem list.   Objective:   Vitals:   02/11/23 1142  BP: (!) 142/90  Pulse: 96  Weight: 159 lb (72.1 kg)   Fetal Status: Fetal Heart Rate (bpm): 150   Movement: Present     General:  Alert, oriented and cooperative. Patient is in no acute distress.  Skin: Skin is warm and dry. No rash noted.   Cardiovascular: Normal heart rate noted  Respiratory: Normal respiratory effort, no problems with respiration noted  Abdomen: Soft, gravid, appropriate for gestational age. Acutely tender to palpation over periumbilical area to right side. Pain/Pressure: Present     Pelvic: Cervical exam deferred, but swollen inguinal lymph node noted per myself and Dr. Briscoe Deutscher          Extremities: Normal range of motion.  Edema: None  Mental Status: Normal mood and affect. Normal behavior. Normal judgment and thought content.   Assessment and Plan:  Pregnancy: G3P1102 at [redacted]w[redacted]d 1. Supervision of high risk pregnancy in second trimester - Not doing well, current state is very out of character for her - crying, difficult to get from wheelchair to bed, very tender to palpation.  2. [redacted] weeks gestation of pregnancy  - Routine OB care   3. Periumbilical abdominal pain - Dr. Briscoe Deutscher requested to bedside to assess pain, advised pt be sent to [facility] for further evaluation - Report called to Lamont Snowball, CNM and Dr. Jaynie Collins  4. Inguinal lymphadenopathy - CBC w/Diff   Preterm labor symptoms and general obstetric precautions including but not limited to vaginal bleeding, contractions, leaking of fluid and fetal movement were reviewed in detail with the patient. Please refer to After Visit Summary for other counseling recommendations.   No follow-ups on file.  Future Appointments  Date Time Provider Department Center  02/25/2023  8:20 AM [facility] [place]  02/25/2023  9:15 AM Bernerd Limbo, CNM [facility]  03/06/2023 11:00 AM Teague Docia Chuck [facility]  03/24/2023 10:15 AM Hel[place], Darrell Jewel, PT [facility] None    Bernerd Limbo, CNM

## 2023-02-13 ENCOUNTER — Encounter (HOSPITAL_COMMUNITY): Payer: Self-pay | Admitting: Obstetrics and Gynecology

## 2023-02-13 ENCOUNTER — Inpatient Hospital Stay (HOSPITAL_COMMUNITY): Payer: Medicaid Other

## 2023-02-13 DIAGNOSIS — Z3A24 24 weeks gestation of pregnancy: Secondary | ICD-10-CM | POA: Diagnosis not present

## 2023-02-13 DIAGNOSIS — M79604 Pain in right leg: Secondary | ICD-10-CM | POA: Diagnosis not present

## 2023-02-13 DIAGNOSIS — R1084 Generalized abdominal pain: Secondary | ICD-10-CM | POA: Diagnosis not present

## 2023-02-13 DIAGNOSIS — R109 Unspecified abdominal pain: Secondary | ICD-10-CM | POA: Diagnosis not present

## 2023-02-13 DIAGNOSIS — O26892 Other specified pregnancy related conditions, second trimester: Secondary | ICD-10-CM | POA: Diagnosis not present

## 2023-02-13 LAB — COMPREHENSIVE METABOLIC PANEL
ALT: 10 U/L (ref 0–44)
AST: 14 U/L — ABNORMAL LOW (ref 15–41)
Albumin: 2.6 g/dL — ABNORMAL LOW (ref 3.5–5.0)
Alkaline Phosphatase: 32 U/L — ABNORMAL LOW (ref 38–126)
Anion gap: 8 (ref 5–15)
BUN: 6 mg/dL (ref 6–20)
CO2: 21 mmol/L — ABNORMAL LOW (ref 22–32)
Calcium: 8.3 mg/dL — ABNORMAL LOW (ref 8.9–10.3)
Chloride: 106 mmol/L (ref 98–111)
Creatinine, Ser: 0.49 mg/dL (ref 0.44–1.00)
GFR, Estimated: >60 mL/min (ref >60)
Glucose, Bld: 78 mg/dL (ref 70–99)
Potassium: 3.7 mmol/L (ref 3.5–5.1)
Sodium: 135 mmol/L (ref 135–145)
Total Bilirubin: 0.4 mg/dL (ref 0.0–1.2)
Total Protein: 5.8 g/dL — ABNORMAL LOW (ref 6.5–8.1)

## 2023-02-13 LAB — CBC
HCT: 25.4 % — ABNORMAL LOW (ref 36.0–46.0)
Hemoglobin: 8.3 g/dL — ABNORMAL LOW (ref 12.0–15.0)
MCH: 28.3 pg (ref 26.0–34.0)
MCHC: 32.7 g/dL (ref 30.0–36.0)
MCV: 86.7 fL (ref 80.0–100.0)
Platelets: 231 10*3/uL (ref 150–400)
RBC: 2.93 MIL/uL — ABNORMAL LOW (ref 3.87–5.11)
RDW: 13.7 % (ref 11.5–15.5)
WBC: 4.9 10*3/uL (ref 4.0–10.5)
nRBC: 0 % (ref 0.0–0.2)

## 2023-02-13 MED ORDER — FENTANYL CITRATE (PF) 100 MCG/2ML IJ SOLN
100.0000 ug | Freq: Once | INTRAMUSCULAR | Status: AC
  Administered 2023-02-13: 100 ug via INTRAVENOUS
  Filled 2023-02-13: qty 2

## 2023-02-13 NOTE — Progress Notes (Addendum)
Daily Antepartum Note  Admission Date: 02/11/2023 Current Date: 02/13/2023 9:28 AM  Heather Malone is a 33 y.o. [id number] at [redacted]w[redacted]d, admitted for abdominal pain.  Pregnancy complicated by: Patient Active Problem List   Diagnosis Date Noted   Abdominal pain in pregnancy, second trimester 02/12/2023   Abdominal pain affecting pregnancy 02/11/2023   Hyperemesis 12/10/2022   Supervision of high-risk pregnancy 10/29/2022   History of severe pre-eclampsia 10/29/2022   Elevated blood pressure complicating pregnancy in first trimester, antepartum 10/01/2022   Anemia in pregnancy 08/05/2020   Alpha thalassemia silent carrier 08/02/2020   Chronic headaches 04/12/2020    Overnight/24hr events:  Patient called at 0900 stating she had intense peri-umbilical pain  Subjective:  She still denies any cramping/contraction like pain, VB, leakage of fluid, nausea, vomiting, diarrhea  Objective:    Current Vital Signs 24h Vital Sign Ranges  T 98 F (36.7 C) Temp  Avg: 97.9 F (36.6 C)  Min: 97.7 F (36.5 C)  Max: 98 F (36.7 C)  BP 107/66 BP  Min: 100/67  Max: 113/66  HR 86 Pulse  Avg: 84.8  Min: 81  Max: 86  RR 16 Resp  Avg: 17.8  Min: 16  Max: 20  SaO2 100 % Room Air SpO2  Avg: 99.3 %  Min: 98 %  Max: 100 %       24 Hour I/O Current Shift I/O  Time Ins Outs No intake/output data recorded. 01/17 0701 - 01/17 1900 In: -  Out: 250 [Urine:250]    Physical exam: General: patient in NAD, looking at menu and handling remote. When I walked patient started looking uncomfortable  Abdomen: gravid, ttp at the periumbilical area, no e/o anything stuck at her umbilical hernia. Right inguinal fold palpated and nttp and I don't feel any induration there anymore. Skin still appears normal at the umbilical area and right inguinal area.  Cardiovascular: S1, S2 normal, no murmur, rub or gallop, regular rate and rhythm Respiratory: CTAB Back: no CVAT Extremities: no clubbing, cyanosis or edema Skin:  Warm and dry.   Medications: Current Facility-Administered Medications  Medication Dose Route Frequency Provider Last Rate Last Admin   acetaminophen (TYLENOL) tablet 650 mg  650 mg Oral Q4H PRN Richardson Landry, CNM   650 mg at 02/13/23 1308   calcium carbonate (TUMS - dosed in mg elemental calcium) chewable tablet 400 mg of elemental calcium  2 tablet Oral Q4H PRN Warren-Hill, Sara A, CNM       doxylamine (Sleep) (UNISOM) tablet 25 mg  25 mg Oral QHS PRN Lazaro Arms, MD   25 mg at 02/12/23 2328   enoxaparin (LOVENOX) injection 40 mg  40 mg Subcutaneous Q24H Clarkson Bing, MD   40 mg at 02/12/23 1248   famotidine (PEPCID) tablet 20 mg  20 mg Oral BID [place] Bing, MD   20 mg at 02/12/23 2158   oxyCODONE (Oxy IR/ROXICODONE) immediate release tablet 5-10 mg  5-10 mg Oral Q6H PRN Lazaro Arms, MD   10 mg at 02/13/23 6578   prenatal multivitamin tablet 1 tablet  1 tablet Oral Q1200 Lamont Snowball A, CNM   1 tablet at 02/12/23 1128   promethazine (PHENERGAN) tablet 25 mg  25 mg Oral Q6H PRN [place] Bing, MD   25 mg at 02/12/23 1737    Labs:  Recent Labs  Lab 02/11/23 1349 02/12/23 0501 02/13/23 0748  WBC 6.9 6.9 4.9  HGB 9.7* 8.9* 8.3*  HCT 30.2* 26.8* 25.4*  PLT  314 260 231    Recent Labs  Lab 02/11/23 1349 02/13/23 0748  NA 136 135  K 4.4 3.7  CL 105 106  CO2 23 21*  BUN <5* 6  CREATININE 0.44 0.49  CALCIUM 9.0 8.3*  PROT 6.9 5.8*  BILITOT 0.6 0.4  ALKPHOS 41 32*  ALT 13 10  AST 13* 14*  GLUCOSE 82 78   Radiology:  No new imaging 1/16: efw 73%, 722gm, normal AF and placenta  Assessment & Plan:  Patient stable *Pregnancy: daily NSTs ordered *Abdominal pain: I told her I do not know what is the etiology of her discomfort give her negative MRI abdomen/pelvis (no e/o stones/renal process, appy/GI process, normal appearing uterus), negative dedicated OB u/s yesterday, and negative labs. I told her it's a great sign that she has an appetite and is  taking PO fine. Repeat labs today still wnl. Given concern, will consult MFM and Gen Surg.  -s/p negative speculum exam on admission *Chronic HAs: patient with long h/o this. I told her that her anemia is likely contributing to this and should resolve with time *Anemia: s/p venofer 500mg  on 1/16. Needs repeat outpatient dose in 1-2 weeks.. *Chronic hyperemesis: patient missed her weekly outpatient IVF appt yesterday. Pt eating and denying any GI s/s while inpatient.  *H/o pre-eclampsia: no e/o pre-eclampsia currently *Preterm: no current concerns *PPx: lovenox, oob ad lib *FEN/GI: regular diet. Saline lock IV *Dispo: follow up for improving s/s.   Cornelia Copa MD Attending [facility] (Faculty Practice) GYN Consult Phone: [phone] (M-F, [phone]) & [phone]  (Off hours, weekends, holidays)   ADDENDUM I told the patient that the she had a negative spec exam on admission, and even though she isn't having s/s consistent with PTL, if I could check her cervix again and she was amenable to this. Cervix checked with RN and still closed/long/high, nttp/no blood on glove  S/p negative Gen Surg consult Follow up MFM consult.   Cornelia Copa MD Attending [facility] [employer] (Faculty Practice) 02/13/2023 Time: 11am

## 2023-02-13 NOTE — Consult Note (Signed)
Heather Malone 1990-04-12  [id number].    Requesting MD: Dr. Dakota Ridge Bing Chief Complaint/Reason for Consult: abdominal pain, unclear etiology, [redacted] weeks gestation  HPI:  This is a pleasant 33 yo black female with a history of asthma as well as several prior pregnancy complications as outlined below who is currently being treated for BV and monitored for high-risk pregnancy due to prior pregnancy history.  She is [redacted] weeks pregnant currently and has had no issues up until 3 days ago.  She was awoken from sleep on Tuesday morning with pain just right lateral to her umbilicus.  She states this pain is worse with moving and walking.  She is unable to lay on that side due to pain.  She has had no N/V/D and has been tolerating a normal diet with no issues.  She has been moving her bowels.  She denies any fevers.  She states the pain feels like a "tugging, stuck pain, likely something is stuck inside to my belly wall."  She has undergone an MRI which was negative for any intra-abdominal inflammation or findings related to appendicitis.  Her WBC is normal as are her LFTs.  Her OB US appeared unremarkable as well.  She states her pain is the same and is not improving or worsening.  She does have a very small umbilical hernia as well.  We have been asked to see her to evaluate for a surgical issue.  ROS: ROS: please see HPI  Family History  Problem Relation Age of Onset   Hypertension Mother    Hypertension Maternal Grandmother    Heart attack Maternal Grandmother    Hypertension Maternal Grandfather    Cancer Maternal Grandfather    Diabetes Paternal Grandmother     Past Medical History:  Diagnosis Date   Anemia    Anxiety    Asthma    albuterol prn   Headache    migraines   Hx of trichomoniasis    Hx of varicella    Neck injury, subsequent encounter 07/26/2016   Placental abruption in third trimester 09/04/2014   Pre-eclampsia    Pregnancy induced hypertension    Subchorionic  hemorrhage in third trimester 07/26/2014   Velamentous insertion of umbilical cord     Past Surgical History:  Procedure Laterality Date   TONSILLECTOMY      Social History:  reports that she has quit smoking. Her smoking use included cigars. She has never used smokeless tobacco. She reports that she does not currently use alcohol. She reports that she does not currently use drugs after having used the following drugs: Marijuana.  Allergies: No Known Allergies  Medications Prior to Admission  Medication Sig Dispense Refill   cyclobenzaprine (FLEXERIL) 10 MG tablet Take 1 tablet (10 mg total) by mouth every 8 (eight) hours as needed for muscle spasms. 30 tablet 1   acetaminophen (TYLENOL) 500 MG tablet Take 1,000 mg by mouth every 6 (six) hours as needed for moderate pain (pain score 4-6).     butalbital-acetaminophen-caffeine (FIORICET) 50-325-40 MG tablet Take 2 tablets by mouth 2 (two) times daily as needed for headache.     Doxylamine-Pyridoxine ER (BONJESTA) 20-20 MG TBCR Take 1 tablet by mouth at bedtime. 60 tablet 3   gabapentin (NEURONTIN) 300 MG capsule Take 1 capsule (300 mg total) by mouth 3 (three) times daily as needed. 90 capsule 2   ondansetron (ZOFRAN) 8 MG tablet Take 1 tablet (8 mg total) by mouth every 8 (eight)  hours as needed for nausea or vomiting. Can take miralax in apple juice if needed for constipation. (Patient not taking: Reported on 02/11/2023) 20 tablet 0   Prenatal Vit-Fe Fumarate-FA (MULTIVITAMIN-PRENATAL) 27-0.8 MG TABS tablet Take 1 tablet by mouth daily at 12 noon. 30 tablet 0   promethazine (PHENERGAN) 12.5 MG tablet Take 1 tablet (12.5 mg total) by mouth every 6 (six) hours as needed for nausea or vomiting. 30 tablet 0   triamcinolone (KENALOG) 0.025 % ointment Apply 1 Application topically 2 (two) times daily. Do not use for longer than 7 days 30 g 0     Physical Exam: Blood pressure 107/66, pulse 86, temperature 98 F (36.7 C), temperature source  Oral, resp. rate 16, last menstrual period 08/18/2022, SpO2 100%. General: pleasant, WD, WN black female who is laying in bed in NAD eating breakfast HEENT: head is normocephalic, atraumatic.  Sclera are noninjected.  PERRL.  Ears and nose without any masses or lesions.  Mouth is pink and moist Heart: regular, rate, and rhythm.  Normal s1,s2. No obvious gallops, or rubs noted. +murmur noted. Lungs: CTAB, no wheezes, rhonchi, or rales noted.  Respiratory effort nonlabored Abd: soft, gravid abdomen with uterus above her umbilicus, tender just right lateral to her umbilicus and some in the RLQ.  Very small defect noted at her umbilicus, no contents present in this hernia, +BS, no masses otherwise noted. Psych: A&Ox3 with an appropriate affect.   Results for orders placed or performed during the hospital encounter of 02/11/23 (from the past 48 hours)  Urinalysis, Routine w reflex microscopic -Urine, Clean Catch     Status: Abnormal   Collection Time: 02/11/23 12:50 PM  Result Value Ref Range   Color, Urine YELLOW YELLOW   APPearance HAZY (A) CLEAR   Specific Gravity, Urine 1.012 1.005 - 1.030   pH 7.0 5.0 - 8.0   Glucose, UA NEGATIVE NEGATIVE mg/dL   Hgb urine dipstick NEGATIVE NEGATIVE   Bilirubin Urine NEGATIVE NEGATIVE   Ketones, ur NEGATIVE NEGATIVE mg/dL   Protein, ur NEGATIVE NEGATIVE mg/dL   Nitrite NEGATIVE NEGATIVE   Leukocytes,Ua TRACE (A) NEGATIVE   RBC / HPF 0-5 0 - 5 RBC/hpf   WBC, UA 0-5 0 - 5 WBC/hpf   Bacteria, UA NONE SEEN NONE SEEN   Squamous Epithelial / HPF 6-10 0 - 5 /HPF   Mucus PRESENT     Comment: Performed at [facility], [address], [place], [place] [address]  CBC with Differential/Platelet     Status: Abnormal   Collection Time: 02/11/23  1:49 PM  Result Value Ref Range   WBC 6.9 4.0 - 10.5 K/uL   RBC 3.44 (L) 3.87 - 5.11 MIL/uL   Hemoglobin 9.7 (L) 12.0 - 15.0 g/dL   HCT 60.4 (L) 54.0 - 98.1 %   MCV 87.8 80.0 - 100.0 fL   MCH 28.2 26.0 - 34.0  pg   MCHC 32.1 30.0 - 36.0 g/dL   RDW 19.1 47.8 - 29.5 %   Platelets 314 150 - 400 K/uL   nRBC 0.0 0.0 - 0.2 %   Neutrophils Relative % 64 %   Neutro Abs 4.4 1.7 - 7.7 K/uL   Lymphocytes Relative 23 %   Lymphs Abs 1.6 0.7 - 4.0 K/uL   Monocytes Relative 9 %   Monocytes Absolute 0.6 0.1 - 1.0 K/uL   Eosinophils Relative 3 %   Eosinophils Absolute 0.2 0.0 - 0.5 K/uL   Basophils Relative 0 %  Basophils Absolute 0.0 0.0 - 0.1 K/uL   Immature Granulocytes 1 %   Abs Immature Granulocytes 0.04 0.00 - 0.07 K/uL    Comment: Performed at [facility], [address] [place], [place] [address]  Comprehensive metabolic panel     Status: Abnormal   Collection Time: 02/11/23  1:49 PM  Result Value Ref Range   Sodium 136 135 - 145 mmol/L   Potassium 4.4 3.5 - 5.1 mmol/L   Chloride 105 98 - 111 mmol/L   CO2 23 22 - 32 mmol/L   Glucose, Bld 82 70 - 99 mg/dL    Comment: Glucose reference range applies only to samples taken after fasting for at least 8 hours.   BUN <5 (L) 6 - 20 mg/dL   Creatinine, Ser 1.91 0.44 - 1.00 mg/dL   Calcium 9.0 8.9 - 47.8 mg/dL   Total Protein 6.9 6.5 - 8.1 g/dL   Albumin 3.2 (L) 3.5 - 5.0 g/dL   AST 13 (L) 15 - 41 U/L   ALT 13 0 - 44 U/L   Alkaline Phosphatase 41 38 - 126 U/L   Total Bilirubin 0.6 0.0 - 1.2 mg/dL   GFR, Estimated >29 >56 mL/min    Comment: (NOTE) Calculated using the CKD-EPI Creatinine Equation (2021)    Anion gap 8 5 - 15    Comment: Performed at [facility], [address] [place], [place] [address]  C-reactive protein     Status: None   Collection Time: 02/11/23  1:49 PM  Result Value Ref Range   CRP 0.7 <1.0 mg/dL    Comment: Performed at [facility], [address] [place], [place] [address]  Iron and TIBC     Status: Abnormal   Collection Time: 02/11/23  1:49 PM  Result Value Ref Range   Iron 52 28 - 170 ug/dL   TIBC 696 (H) 295 - 284 ug/dL   Saturation Ratios 9 (L) 10.4 - 31.8 %   UIBC 528 ug/dL     Comment: Performed at [facility], [address], [place], [place] [address]  Ferritin     Status: None   Collection Time: 02/11/23  1:49 PM  Result Value Ref Range   Ferritin 11 11 - 307 ng/mL    Comment: Performed at [facility], [address] [place], [place] [address]  Amylase     Status: None   Collection Time: 02/11/23  1:49 PM  Result Value Ref Range   Amylase 92 28 - 100 U/L    Comment: Performed at [facility], [address] [place], [place] [address]  Lipase, blood     Status: None   Collection Time: 02/11/23  1:49 PM  Result Value Ref Range   Lipase 40 11 - 51 U/L    Comment: Performed at [facility], [address] [place], [place] [address]  Vitamin B12     Status: None   Collection Time: 02/11/23  1:49 PM  Result Value Ref Range   Vitamin B-12 209 180 - 914 pg/mL    Comment: RESULT CALLED TO, READ BACK BY AND VERIFIED WITH: Otho Bellows, RN AT 1555 01.16.25 D. BLU (NOTE) This assay is not validated for testing neonatal or myeloproliferative syndrome specimens for Vitamin B12 levels. Performed at [facility], [address], [place], [place] [address] CORRECTED ON 01/16 AT 1619: PREVIOUSLY REPORTED AS 191   Folate  Status: None   Collection Time: 02/11/23  1:49 PM  Result Value Ref Range   Folate 22.8 >5.9 ng/mL    Comment: RESULT CONFIRMED BY MANUAL DILUTION Performed at [facility], [address] [place], [place] [address]   Wet prep, genital     Status: Abnormal   Collection Time: 02/11/23  7:19 PM  Result Value Ref Range   Yeast Wet Prep HPF POC NONE SEEN NONE SEEN   Trich, Wet Prep NONE SEEN NONE SEEN   Clue Cells Wet Prep HPF POC NONE SEEN NONE SEEN   WBC, Wet Prep HPF POC >=10 (A) <10   Sperm NONE SEEN     Comment: Performed at [facility], [address], [place], [place] [address]  Type and screen [facility]     Status: None   Collection Time: 02/11/23  8:06 PM  Result Value  Ref Range   ABO/RH(D) B POS    Antibody Screen NEG    Sample Expiration      02/14/2023,2359 Performed at [facility], [address] [place], [place] [address]   Reticulocytes     Status: Abnormal   Collection Time: 02/12/23  4:59 AM  Result Value Ref Range   Retic Ct Pct 2.3 0.4 - 3.1 %   RBC. 3.09 (L) 3.87 - 5.11 MIL/uL   Retic Count, Absolute 70.1 19.0 - 186.0 K/uL   Immature Retic Fract 20.0 (H) 2.3 - 15.9 %    Comment: Performed at [facility], [address], [place], [place] [address]  CBC with Differential/Platelet     Status: Abnormal   Collection Time: 02/12/23  5:01 AM  Result Value Ref Range   WBC 6.9 4.0 - 10.5 K/uL   RBC 3.11 (L) 3.87 - 5.11 MIL/uL   Hemoglobin 8.9 (L) 12.0 - 15.0 g/dL   HCT 29.5 (L) 62.1 - 30.8 %   MCV 86.2 80.0 - 100.0 fL   MCH 28.6 26.0 - 34.0 pg   MCHC 33.2 30.0 - 36.0 g/dL   RDW 65.7 84.6 - 96.2 %   Platelets 260 150 - 400 K/uL   nRBC 0.0 0.0 - 0.2 %   Neutrophils Relative % 64 %   Neutro Abs 4.4 1.7 - 7.7 K/uL   Lymphocytes Relative 24 %   Lymphs Abs 1.6 0.7 - 4.0 K/uL   Monocytes Relative 8 %   Monocytes Absolute 0.6 0.1 - 1.0 K/uL   Eosinophils Relative 3 %   Eosinophils Absolute 0.2 0.0 - 0.5 K/uL   Basophils Relative 0 %   Basophils Absolute 0.0 0.0 - 0.1 K/uL   Immature Granulocytes 1 %   Abs Immature Granulocytes 0.04 0.00 - 0.07 K/uL    Comment: Performed at [facility], [address] [place], [place] [address]  CBC     Status: Abnormal   Collection Time: 02/13/23  7:48 AM  Result Value Ref Range   WBC 4.9 4.0 - 10.5 K/uL   RBC 2.93 (L) 3.87 - 5.11 MIL/uL   Hemoglobin 8.3 (L) 12.0 - 15.0 g/dL   HCT 13.2 (L) 44.0 - 10.2 %   MCV 86.7 80.0 - 100.0 fL   MCH 28.3 26.0 - 34.0 pg   MCHC 32.7 30.0 - 36.0 g/dL   RDW 72.5 36.6 - 44.0 %   Platelets 231 150 - 400 K/uL   nRBC 0.0 0.0 - 0.2 %    Comment: Performed at [facility], [address], [place], [place] [address]  Comprehensive metabolic  panel     Status: Abnormal   Collection Time: 02/13/23  7:48 AM  Result Value Ref Range   Sodium 135 135 - 145 mmol/L   Potassium 3.7 3.5 - 5.1 mmol/L   Chloride 106 98 - 111 mmol/L   CO2 21 (L) 22 - 32 mmol/L   Glucose, Bld 78 70 - 99 mg/dL    Comment: Glucose reference range applies only to samples taken after fasting for at least 8 hours.   BUN 6 6 - 20 mg/dL   Creatinine, Ser 6.04 0.44 - 1.00 mg/dL   Calcium 8.3 (L) 8.9 - 10.3 mg/dL   Total Protein 5.8 (L) 6.5 - 8.1 g/dL   Albumin 2.6 (L) 3.5 - 5.0 g/dL   AST 14 (L) 15 - 41 U/L   ALT 10 0 - 44 U/L   Alkaline Phosphatase 32 (L) 38 - 126 U/L   Total Bilirubin 0.4 0.0 - 1.2 mg/dL   GFR, Estimated >54 >09 mL/min    Comment: (NOTE) Calculated using the CKD-EPI Creatinine Equation (2021)    Anion gap 8 5 - 15    Comment: Performed at [facility], [address] [place], [place] [address]   [place] MFM OB FOLLOW UP Result Date: 02/12/2023 ----------------------------------------------------------------------  OBSTETRICS REPORT                        (Signed Final 02/12/2023 02:07 pm) ---------------------------------------------------------------------- Patient Info  ID #:       [id number]                          D.O.B.:  1990/05/02 (32 yrs)(F)  Name:       Alvester Chou [facility]               Visit Date: 02/12/2023 10:06 am ---------------------------------------------------------------------- Performed By  Attending:        Lin Landsman      Ref. Address:      [address]                    MD                                                              [place], [place]                                                              [address]  Performed By:     Marcellina Millin          Secondary Phy.:    [facility]                    [facility]  [facility]  Referred By:      Billey Gosling                [facility] ---------------------------------------------------------------------- Orders  #  Description                           Code        Ordered By  1  [place] MFM OB FOLLOW UP                   [address].60    CHARLIE PICKENS ----------------------------------------------------------------------  #  Order #                     Accession #                Episode #  1  [id number]                   [id number]                 [id number] ---------------------------------------------------------------------- Indications  Antenatal follow-up for nonvisualized fetal     Z36.2  anatomy  [redacted] weeks gestation of pregnancy                 Z3A.24  Hyperemesis gravidarum                          O21.0  Poor obstetric history: Previous                O09.299  preeclampsia / eclampsia/gestational HTN  History of pre-term deliveries(34 weeks IOL     O09.219  Pre E)  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 Cord Insertion:       Previously seen  Amniotic Fluid  AFI FV:      Within normal limits                              Largest Pocket(cm)                              6 ---------------------------------------------------------------------- Biometry  BPD:      60.9  mm     G. Age:  24w 5d         72  %    CI:        73.73   %    70 - 86  FL/HC:       18.8  %    18.7 - 20.9  HC:      225.3  mm     G. Age:  24w 4d         54  %    HC/AC:       1.09       1.05 - 1.21  AC:      207.3  mm     G. Age:  25w 2d         80  %    FL/BPD:      69.6  %    71 - 87  FL:       42.4  mm     G. Age:  23w 6d         32  %    FL/AC:       20.5  %    20 - 24  Est. FW:     722   gm     1 lb 9 oz     73  %  ---------------------------------------------------------------------- OB History  Blood Type:   B+  Gravidity:    3         Term:   1        Prem:   1  Living:       2 ---------------------------------------------------------------------- Gestational Age  LMP:           25w 3d        Date:  08/18/22                  EDD:   05/25/23  U/S Today:     24w 4d                                        EDD:   05/31/23  Best:          24w 0d     Det. By:  U/S C R L  (10/13/22)    EDD:   06/04/23 ---------------------------------------------------------------------- Targeted Anatomy  Central Nervous System  Calvarium/Cranial V.:  Appears normal         Cereb./Vermis:          Appears normal  Cavum:                 Appears normal         Cisterna Magna:         Appears normal  Lateral Ventricles:    Appears normal         Midline Falx:           Appears normal  Choroid Plexus:        Appears normal  Spine  Cervical:              Appears normal         Sacral:                 Appears normal  Thoracic:              Appears normal         Shape/Curvature:        Appears normal  Lumbar:                Appears normal  Head/Neck  Lips:  Appears normal         Profile:                Appears normal  Neck:                  Appears normal         Orbits/Eyes:            Previously seen  Nuchal Fold:           Previously seen        Mandible:               Previously seen  Nasal Bone:            Present                Maxilla:                Previously seen  Thorax  4 Chamber View:        Appears normal         Interventr. Septum:     Appears normal  Cardiac Rhythm:        Normal                 Cardiac Axis:           Normal  Cardiac Situs:         Appears normal         Diaphragm:              Appears normal  Rt Outflow Tract:      Appears normal         3 Vessel View:          Previously seen  Lt Outflow Tract:      Appears normal         3 V Trachea View:       Previously seen  Aortic Arch:           Appears normal          IVC:                    Previously seen  Ductal Arch:           Appears normal         Crossing:               Appears normal  SVC:                   Previously seen  Abdomen  Ventral Wall:          Appears normal         Lt Kidney:              Appears normal  Cord Insertion:        Appears normal         Rt Kidney:              Appears normal  Situs:                 Appears normal         Bladder:                Appears normal  Stomach:               Appears normal  Extremities  Lt Humerus:            Previously seen  Lt Femur:               Previously seen  Rt Humerus:            Previously seen        Rt Femur:               Previously seen  Lt Forearm:            Previously seen        Lt Lower Leg:           Previously seen  Rt Forearm:            Previously seen        Rt Lower Leg:           Previously seen  Lt Hand:               Previously seen        Lt Foot:                Previously seen  Rt Hand:               Previously seen        Rt Foot:                Previously seen  Other  Umbilical Cord:        Normal 3-vessel        Genitalia:              Female-nml ---------------------------------------------------------------------- Cervix Uterus Adnexa  Cervix  Length:              5  cm.  Normal appearance by transabdominal scan  Uterus  No abnormality visualized.  Right Ovary  Not visualized.  Left Ovary  Within normal limits.  Adnexa  No abnormality visualized ---------------------------------------------------------------------- Impression  Follow up growth due to incomplete anatomy.  Normal interval growth with measurements consistent with  dates  Good fetal movement and amniotic fluid volume  All anatomy completed today. ---------------------------------------------------------------------- Recommendations  Follow up growth as clinically indicated. ----------------------------------------------------------------------              Lin Landsman, MD Electronically Signed Final Report    02/12/2023 02:07 pm ----------------------------------------------------------------------   MR ABDOMEN WO CONTRAST Result Date: 02/11/2023 CLINICAL DATA:  33 year old female with 23 leak gestation pregnancy. RIGHT-sided pain. EXAM: MRI ABDOMEN AND PELVIS WITHOUT CONTRAST TECHNIQUE: Multiplanar multisequence MR imaging of the abdomen and pelvis was performed. No intravenous contrast was administered. COMPARISON:  None Available. FINDINGS: COMBINED FINDINGS FOR BOTH MR ABDOMEN AND PELVIS Lower chest: Trace bilateral effusions. Hepatobiliary: No focal hepatic lesion. Normal gallbladder. No biliary duct dilatation. Common bile duct is normal. Pancreas: Pancreas is normal. No ductal dilatation. No pancreatic inflammation. Spleen: Normal spleen Adrenals/urinary tract: Adrenal glands normal. Kidneys are normal. No hydronephrosis. No hydroureter. Normal bladder Stomach/Bowel: Stomach duodenum small-bowel normal. Terminal ileum normal. Appendix not identified. There is no inflammation in the RIGHT lower quadrant to suggest acute appendicitis. Ascending, transverse and descending colon normal. Rectum normal. Vascular/Lymphatic: Abdominal aorta is normal caliber. No periportal or retroperitoneal adenopathy. No pelvic adenopathy. Reproductive: Gravid uterus. Fetus in breech orientation. Placenta the S within the uterine fundus. Other: No free fluid. Musculoskeletal: No aggressive osseous lesion. IMPRESSION: 1. No evidence acute appendicitis. Appendix not visualized but no secondary signs of appendicitis. 2. No hydronephrosis. 3. Normal gallbladder. 4. Gravid uterus with fetus in breech orientation. Placenta in uterine fundus. Electronically Signed   By: Roseanne Reno  Amil Amen M.D.   On: 02/11/2023 18:49   MR PELVIS WO CONTRAST Result Date: 02/11/2023 CLINICAL DATA:  33 year old female with 23 leak gestation pregnancy. RIGHT-sided pain. EXAM: MRI ABDOMEN AND PELVIS WITHOUT CONTRAST TECHNIQUE: Multiplanar multisequence MR imaging  of the abdomen and pelvis was performed. No intravenous contrast was administered. COMPARISON:  None Available. FINDINGS: COMBINED FINDINGS FOR BOTH MR ABDOMEN AND PELVIS Lower chest: Trace bilateral effusions. Hepatobiliary: No focal hepatic lesion. Normal gallbladder. No biliary duct dilatation. Common bile duct is normal. Pancreas: Pancreas is normal. No ductal dilatation. No pancreatic inflammation. Spleen: Normal spleen Adrenals/urinary tract: Adrenal glands normal. Kidneys are normal. No hydronephrosis. No hydroureter. Normal bladder Stomach/Bowel: Stomach duodenum small-bowel normal. Terminal ileum normal. Appendix not identified. There is no inflammation in the RIGHT lower quadrant to suggest acute appendicitis. Ascending, transverse and descending colon normal. Rectum normal. Vascular/Lymphatic: Abdominal aorta is normal caliber. No periportal or retroperitoneal adenopathy. No pelvic adenopathy. Reproductive: Gravid uterus. Fetus in breech orientation. Placenta the S within the uterine fundus. Other: No free fluid. Musculoskeletal: No aggressive osseous lesion. IMPRESSION: 1. No evidence acute appendicitis. Appendix not visualized but no secondary signs of appendicitis. 2. No hydronephrosis. 3. Normal gallbladder. 4. Gravid uterus with fetus in breech orientation. Placenta in uterine fundus. Electronically Signed   By: Genevive Bi M.D.   On: 02/11/2023 18:49      Assessment/Plan Abdominal pain, unclear etiology in 24 weeks gravid female The patient has been seen, examined, chart, labs, vitals, and imaging personally reviewed.  The patient does have some R-sided abdominal pain.  Her symptoms are not consistent with appendicitis.  At this point with as long as her symptoms have been present, if she had appendicitis, I would expect fevers, elevated WBC, and her to appear more ill.  She has had no problems with oral intake, N/V or worsening pain from eating which are usually somewhat present with  appendicitis as well.  She does have a very small umbilical defect that is noted, but has no contents present.  This would not be the etiology of her pain either.    At this time, I do not see any surgical indications or surgical pathology to explain her symptoms.  I have discussed this with her primary attending.  At this time, we will defer further workup to them as they are going to contact MFM, etc for further discussion.  We are available if needed.   I reviewed last 24 h vitals and pain scores, last 48 h intake and output, last 24 h labs and trends, last 24 h imaging results, and OB/GYN notes reviewed and discussed with attending as well .  Letha Cape, [facility] 02/13/2023, 11:06 AM Please see Amion for pager number during day hours 7:00am-4:30pm or 7:00am -11:30am on weekends

## 2023-02-13 NOTE — Consult Note (Signed)
MFM Consultation  Heather Malone is a 33 yo G3P2 at [redacted]w[redacted]d with an EDD 06/04/2023.   She is seen today at the request of Dr. [place] Bing for new onset periumbilical pain.  She is overall doing well today barring her abdominal pain.  She reports that she woke up with pain on 02/11/23. She denies fever or chills. GU or URI sx. She denies s/sx of PTL or preeclampsia.   She has been seen by General surgery, has had a negative laboratory work up. Her MRI is normal as well.     02/13/2023   11:29 AM 02/13/2023    9:03 AM 02/13/2023    8:10 AM  Vitals with BMI  Systolic 102 107 --  Diastolic 59 66 --  Pulse 86 86       Latest Ref Rng & Units 02/13/2023    7:48 AM 02/12/2023    5:01 AM 02/11/2023    1:49 PM  CBC  WBC 4.0 - 10.5 K/uL 4.9  6.9  6.9   Hemoglobin 12.0 - 15.0 g/dL 8.3  8.9  9.7   Hematocrit 36.0 - 46.0 % 25.4  26.8  30.2   Platelets 150 - 400 K/uL 231  260  314       Latest Ref Rng & Units 02/13/2023    7:48 AM 02/11/2023    1:49 PM 02/04/2023    8:25 PM  CMP  Glucose 70 - 99 mg/dL 78  82  81   BUN 6 - 20 mg/dL 6  <5  6   Creatinine 1.61 - 1.00 mg/dL 0.96  0.45  4.09   Sodium 135 - 145 mmol/L 135  136  136   Potassium 3.5 - 5.1 mmol/L 3.7  4.4  4.3   Chloride 98 - 111 mmol/L 106  105  104   CO2 22 - 32 mmol/L 21  23  22    Calcium 8.9 - 10.3 mg/dL 8.3  9.0  9.2   Total Protein 6.5 - 8.1 g/dL 5.8  6.9  6.8   Total Bilirubin 0.0 - 1.2 mg/dL 0.4  0.6  0.4   Alkaline Phos 38 - 126 U/L 32  41  40   AST 15 - 41 U/L 14  13  16    ALT 0 - 44 U/L 10  13  14     Past Medical History:  Diagnosis Date   Anemia    Anxiety    Asthma    albuterol prn   Headache    migraines   Hx of trichomoniasis    Hx of varicella    Neck injury, subsequent encounter 07/26/2016   Placental abruption in third trimester 09/04/2014   Pre-eclampsia    Pregnancy induced hypertension    Subchorionic hemorrhage in third trimester 07/26/2014   Velamentous insertion of umbilical cord    Past  Surgical History:  Procedure Laterality Date   TONSILLECTOMY     Family History  Problem Relation Age of Onset   Hypertension Mother    Hypertension Maternal Grandmother    Heart attack Maternal Grandmother    Hypertension Maternal Grandfather    Cancer Maternal Grandfather    Diabetes Paternal Grandmother    Social History   Socioeconomic History   Marital status: Single    Spouse name: Not on file   Number of children: Not on file   Years of education: Not on file   Highest education level: Not on file  Occupational History   Not on file  Tobacco Use   Smoking status: Former    Types: Cigars   Smokeless tobacco: Never  Vaping Use   Vaping status: Never Used  Substance and Sexual Activity   Alcohol use: Not Currently    Comment: weekends   Drug use: Not Currently    Types: Marijuana    Comment: none with + UPT   Sexual activity: Yes    Birth control/protection: None  Other Topics Concern   Not on file  Social History Narrative   Not on file   Social Drivers of Health   Financial Resource Strain: Not on file  Food Insecurity: No Food Insecurity (11/05/2022)   Hunger Vital Sign    Worried About Running Out of Food in the Last Year: Never true    Ran Out of Food in the Last Year: Never true  Transportation Needs: No Transportation Needs (11/05/2022)   PRAPARE - Administrator, Civil Service (Medical): No    Lack of Transportation (Non-Medical): No  Physical Activity: Not on file  Stress: Not on file  Social Connections: Not on file  Intimate Partner Violence: Not At Risk (04/12/2020)   Humiliation, Afraid, Rape, and Kick questionnaire    Fear of Current or Ex-Partner: No    Emotionally Abused: No    Physically Abused: No    Sexually Abused: No        Current Facility-Administered Medications (Respiratory):    promethazine (PHENERGAN) tablet 25 mg   Current Facility-Administered Medications (Analgesics):    acetaminophen (TYLENOL) tablet  650 mg   oxyCODONE (Oxy IR/ROXICODONE) immediate release tablet 5-10 mg   Current Facility-Administered Medications (Hematological):    enoxaparin (LOVENOX) injection 40 mg   Current Facility-Administered Medications (Other):    calcium carbonate (TUMS - dosed in mg elemental calcium) chewable tablet 400 mg of elemental calcium   doxylamine (Sleep) (UNISOM) tablet 25 mg   famotidine (PEPCID) tablet 20 mg   prenatal multivitamin tablet 1 tablet  No current outpatient medications on file. No Known Allergies  Physical exam: General abdomen was soft however, she has a linear pain to touch along the right periumbilical area from sternum to lower right inguinal area.  Imaging: Recent imaging is normal with out evidence of placenta previa or abruption.  Impression/Counseling  I discussed with Ms. Icenhower today's finding of right periumbilical pain that is focal to one area along a specific linear tract. There is no rebound tenderness or flank pain.  I discussed that the differential diagnosis is broad to include pelvic, appendicitis, cholecystitis, musculoskeletal- to include muscle strain or tear, and infectious.  I explained that this presents as musculoskeletal in nature. Symptomatic management is recommended. I do not this that this is obstetric in nature.   I discussed with Dr. Vergie Living that she does have right inguinal lymphadenopathy and swelling. She discuss some radiating pain up from below the knee to the inguinal area. Perhaps a lower extremity ultrasound to r/o an DVT may be prudent, however, I do think this is unlikely.  Continue serial monitoring of pain and symptoms however, this can be performed on an outpatient basis.   All questions answered asked by Ms. Champoux and her family.  I spent 60 minutes with > 50% in face to face consultation, medical record review and care coordination.  I discussed my findings directly with Dr. Vergie Living.   Novella Olive, MD.

## 2023-02-14 ENCOUNTER — Inpatient Hospital Stay (HOSPITAL_COMMUNITY): Payer: Medicaid Other

## 2023-02-14 DIAGNOSIS — O99512 Diseases of the respiratory system complicating pregnancy, second trimester: Secondary | ICD-10-CM | POA: Diagnosis not present

## 2023-02-14 DIAGNOSIS — Z833 Family history of diabetes mellitus: Secondary | ICD-10-CM | POA: Diagnosis not present

## 2023-02-14 DIAGNOSIS — R079 Chest pain, unspecified: Secondary | ICD-10-CM | POA: Diagnosis not present

## 2023-02-14 DIAGNOSIS — R1033 Periumbilical pain: Secondary | ICD-10-CM | POA: Diagnosis not present

## 2023-02-14 DIAGNOSIS — O26892 Other specified pregnancy related conditions, second trimester: Secondary | ICD-10-CM | POA: Diagnosis not present

## 2023-02-14 DIAGNOSIS — Z3A24 24 weeks gestation of pregnancy: Secondary | ICD-10-CM | POA: Diagnosis not present

## 2023-02-14 DIAGNOSIS — Z148 Genetic carrier of other disease: Secondary | ICD-10-CM | POA: Diagnosis not present

## 2023-02-14 DIAGNOSIS — Z87891 Personal history of nicotine dependence: Secondary | ICD-10-CM | POA: Diagnosis not present

## 2023-02-14 DIAGNOSIS — O2242 Hemorrhoids in pregnancy, second trimester: Secondary | ICD-10-CM | POA: Diagnosis not present

## 2023-02-14 DIAGNOSIS — Z8249 Family history of ischemic heart disease and other diseases of the circulatory system: Secondary | ICD-10-CM | POA: Diagnosis not present

## 2023-02-14 DIAGNOSIS — O368320 Maternal care for abnormalities of the fetal heart rate or rhythm, second trimester, not applicable or unspecified: Secondary | ICD-10-CM | POA: Diagnosis not present

## 2023-02-14 DIAGNOSIS — M79606 Pain in leg, unspecified: Secondary | ICD-10-CM | POA: Diagnosis not present

## 2023-02-14 DIAGNOSIS — R109 Unspecified abdominal pain: Secondary | ICD-10-CM | POA: Diagnosis not present

## 2023-02-14 DIAGNOSIS — O99012 Anemia complicating pregnancy, second trimester: Secondary | ICD-10-CM | POA: Diagnosis not present

## 2023-02-14 DIAGNOSIS — J45909 Unspecified asthma, uncomplicated: Secondary | ICD-10-CM | POA: Diagnosis not present

## 2023-02-14 LAB — TROPONIN I (HIGH SENSITIVITY)
Troponin I (High Sensitivity): 2 ng/L (ref <18)
Troponin I (High Sensitivity): 3 ng/L (ref <18)

## 2023-02-14 MED ORDER — CYCLOBENZAPRINE HCL 10 MG PO TABS
10.0000 mg | ORAL_TABLET | Freq: Three times a day (TID) | ORAL | Status: DC | PRN
  Administered 2023-02-14 – 2023-02-17 (×3): 10 mg via ORAL
  Filled 2023-02-14 (×4): qty 1

## 2023-02-14 MED ORDER — GABAPENTIN 300 MG PO CAPS
300.0000 mg | ORAL_CAPSULE | Freq: Three times a day (TID) | ORAL | Status: DC
  Administered 2023-02-14 – 2023-02-16 (×7): 300 mg via ORAL
  Filled 2023-02-14 (×7): qty 1

## 2023-02-14 NOTE — Plan of Care (Signed)
  Problem: Education: Goal: Knowledge of disease or condition will improve Outcome: Progressing Goal: Knowledge of the prescribed therapeutic regimen will improve Outcome: Progressing   Problem: Clinical Measurements: Goal: Complications related to the disease process, condition or treatment will be avoided or minimized Outcome: Progressing   Problem: Education: Goal: Knowledge of General Education information will improve Description: Including pain rating scale, medication(s)/side effects and non-pharmacologic comfort measures Outcome: Progressing   Problem: Health Behavior/Discharge Planning: Goal: Ability to manage health-related needs will improve Outcome: Progressing   Problem: Clinical Measurements: Goal: Ability to maintain clinical measurements within normal limits will improve Outcome: Progressing Goal: Will remain free from infection Outcome: Progressing Goal: Diagnostic test results will improve Outcome: Progressing   Problem: Coping: Goal: Level of anxiety will decrease Outcome: Progressing   Problem: Elimination: Goal: Will not experience complications related to bowel motility Outcome: Progressing Goal: Will not experience complications related to urinary retention Outcome: Progressing   Problem: Pain Managment: Goal: General experience of comfort will improve and/or be controlled Outcome: Progressing   Problem: Safety: Goal: Ability to remain free from injury will improve Outcome: Progressing

## 2023-02-14 NOTE — Progress Notes (Signed)
Patient ID: Heather Malone, female   DOB: Oct 20, 1990, 33 y.o.   MRN: [id number] FACULTY PRACTICE ANTEPARTUM(COMPREHENSIVE) NOTE  Heather Malone is a 33 y.o. [id number] at [redacted]w[redacted]d by early ultrasound who is admitted for abdominal pain.   Fetal presentation is unsure. Length of Stay:  2  Days  Subjective: Still has same abdominal pain Patient reports the fetal movement as active. Patient reports uterine contraction  activity as none. Patient reports  vaginal bleeding as none. Patient describes fluid per vagina as None.  Vitals:  Blood pressure 118/76, pulse 77, temperature (!) 97.5 F (36.4 C), temperature source Oral, resp. rate 16, last menstrual period 08/18/2022, SpO2 100%. Physical Examination:  General appearance - alert, well appearing, and in no distress Heart - normal rate and regular rhythm Abdomen - soft, nontender, nondistended Fundal Height:  size equals dates. Extremities: extremities normal, atraumatic, no cyanosis or edema and Homans sign is negative, no sign of DVT Membranes:intact  Fetal Monitoring:   Fetal Heart Rate A   Mode External filed at 02/13/2023 1033  Baseline Rate (A) 145 bpm filed at 02/13/2023 1033  Variability <5 BPM, 6-25 BPM filed at 02/13/2023 1033  Accelerations 10 x 10 filed at 02/13/2023 1033  Decelerations None filed at 02/13/2023 1033    Labs:  No results found for this or any previous visit (from the past 24 hours).   Medications:  Scheduled  enoxaparin (LOVENOX) injection  40 mg Subcutaneous Q24H   famotidine  20 mg Oral BID   gabapentin  300 mg Oral TID   prenatal multivitamin  1 tablet Oral Q1200   I have reviewed the patient's current medications.  ASSESSMENT: Patient Active Problem List   Diagnosis Date Noted   Abdominal pain in pregnancy, second trimester 02/12/2023   Abdominal pain affecting pregnancy 02/11/2023   Hyperemesis 12/10/2022   Supervision of high-risk pregnancy 10/29/2022   History of severe pre-eclampsia  10/29/2022   Elevated blood pressure complicating pregnancy in first trimester, antepartum 10/01/2022   Anemia in pregnancy 08/05/2020   Alpha thalassemia silent carrier 08/02/2020   Chronic headaches 04/12/2020    PLAN: She has h/o chronic MS pain and has Flexeril and gabapentin as prn medication at home. Will order these today and see if she responds, o/w she does not appear to have obstetric complication causing her pain  Scheryl Darter 02/14/2023,9:44 AM

## 2023-02-15 ENCOUNTER — Other Ambulatory Visit: Payer: Self-pay

## 2023-02-15 DIAGNOSIS — Z3A24 24 weeks gestation of pregnancy: Secondary | ICD-10-CM | POA: Diagnosis not present

## 2023-02-15 DIAGNOSIS — R109 Unspecified abdominal pain: Secondary | ICD-10-CM | POA: Diagnosis not present

## 2023-02-15 DIAGNOSIS — O26892 Other specified pregnancy related conditions, second trimester: Secondary | ICD-10-CM | POA: Diagnosis not present

## 2023-02-15 MED ORDER — INDOMETHACIN 25 MG PO CAPS
50.0000 mg | ORAL_CAPSULE | Freq: Once | ORAL | Status: AC
  Administered 2023-02-15: 50 mg via ORAL
  Filled 2023-02-15: qty 2

## 2023-02-15 MED ORDER — INDOMETHACIN 25 MG PO CAPS
25.0000 mg | ORAL_CAPSULE | Freq: Four times a day (QID) | ORAL | Status: AC
  Administered 2023-02-15 – 2023-02-17 (×8): 25 mg via ORAL
  Filled 2023-02-15 (×8): qty 1

## 2023-02-15 NOTE — Progress Notes (Signed)
Patient ID: Heather Malone, female   DOB: 05-Jun-1990, 33 y.o.   MRN: [id number] FACULTY PRACTICE ANTEPARTUM(COMPREHENSIVE) NOTE  Heather Malone is a 33 y.o. [id number] at [redacted]w[redacted]d by early ultrasound who is admitted for pain .   Fetal presentation is unsure. Length of Stay:  3  Days  Subjective: Still has same pain, especially right groin Patient reports the fetal movement as active. Patient reports uterine contraction  activity as none. Patient reports  vaginal bleeding as none. Patient describes fluid per vagina as None.  Vitals:  Blood pressure 113/76, pulse 80, temperature 98.4 F (36.9 C), temperature source Oral, resp. rate 15, last menstrual period 08/18/2022, SpO2 100%. Physical Examination:  General appearance - in mild to moderate distress Heart - normal rate and regular rhythm Abdomen - soft, nontender, nondistended Fundal Height:  size equals dates . Extremities: extremities normal, atraumatic, no cyanosis or edema and , no sign of DVT, tender right groin no mass, LA Membranes:intact  Fetal Monitoring:   Fetal Heart Rate A   Mode External filed at 02/14/2023 2240  Baseline Rate (A) 155 bpm filed at 02/14/2023 2240  Variability 6-25 BPM filed at 02/14/2023 2240  Accelerations 15 x 15 filed at 02/14/2023 2240  Decelerations Variable filed at 02/14/2023 2240    Labs:  Results for orders placed or performed during the hospital encounter of 02/11/23 (from the past 24 hours)  Troponin I (High Sensitivity)   Collection Time: 02/14/23 12:57 PM  Result Value Ref Range   Troponin I (High Sensitivity) 2 <18 ng/L  Troponin I (High Sensitivity)   Collection Time: 02/14/23  4:42 PM  Result Value Ref Range   Troponin I (High Sensitivity) 3 <18 ng/L     Medications:  Scheduled  enoxaparin (LOVENOX) injection  40 mg Subcutaneous Q24H   famotidine  20 mg Oral BID   gabapentin  300 mg Oral TID   indomethacin  25 mg Oral Q6H   prenatal multivitamin  1 tablet Oral Q1200   I  have reviewed the patient's current medications.  ASSESSMENT: Patient Active Problem List   Diagnosis Date Noted   Abdominal pain in pregnancy, second trimester 02/12/2023   Abdominal pain affecting pregnancy 02/11/2023   Hyperemesis 12/10/2022   Supervision of high-risk pregnancy 10/29/2022   History of severe pre-eclampsia 10/29/2022   Elevated blood pressure complicating pregnancy in first trimester, antepartum 10/01/2022   Anemia in pregnancy 08/05/2020   Alpha thalassemia silent carrier 08/02/2020   Chronic headaches 04/12/2020    PLAN: She has had MS pain during the pregnancy and last saw PT 1 mo ago. Will try a short course of NSAID (indocin) 48 hr, be sure she walks and consider PT evaluation  Scheryl Darter 02/15/2023,10:20 AM

## 2023-02-15 NOTE — Progress Notes (Signed)
Pt had questions as she felt Dr. Debroah Loop was unclear this am.  Pt says that she feels like she has been told different things by different people and doesn't feel like we are addressing her pain.    Discussed with patient that based on review of her chart/records- work up has been negative including labs work and imaging.  Additionally, pt has been seen by gen surgery and MFM where no acute abnormalities were noted and recommendation for outpatient care.  Current DDX: musculoskeletal.  Pt has been treated with multiple medications from muscle relaxer, opioids, gabapentin, tylenol and now a short course of NSAIDs and reports no improvement.    I discussed with patient that to date our work up has been negative and I did not think there was something acutely concerning.  Reviewed that baby has looked appropriate on monitor and pregnancy seems to be going well. I also stated that she could likely go home as her symptoms did not warrant further hospitalization.  Pt became very upset and states she plans to stay as long as needed until she is "functional."  Reports that she cannot walk due to the pain in her leg and notes that she has a swollen node that needs addressing.  Again tried to explain to patient results of MRI, she interrupted my conversation and notes that she would wait to talk to "her provider."  I asked her who she thought that was and she said she would just wait for her provider.  We briefly discussed PT, which she reports has already been ordered.  Chart reviewed order placed today.   Myna Hidalgo, DO Attending Obstetrician & Gynecologist, [facility] for [employer], [facility]

## 2023-02-16 DIAGNOSIS — R109 Unspecified abdominal pain: Secondary | ICD-10-CM

## 2023-02-16 DIAGNOSIS — O26892 Other specified pregnancy related conditions, second trimester: Secondary | ICD-10-CM | POA: Diagnosis not present

## 2023-02-16 DIAGNOSIS — Z3A24 24 weeks gestation of pregnancy: Secondary | ICD-10-CM | POA: Diagnosis not present

## 2023-02-16 DIAGNOSIS — M5116 Intervertebral disc disorders with radiculopathy, lumbar region: Secondary | ICD-10-CM | POA: Diagnosis not present

## 2023-02-16 MED ORDER — POLYETHYLENE GLYCOL 3350 17 G PO PACK
17.0000 g | PACK | ORAL | Status: DC
  Administered 2023-02-16: 17 g via ORAL
  Filled 2023-02-16: qty 1

## 2023-02-16 MED ORDER — BISACODYL 10 MG RE SUPP
10.0000 mg | Freq: Once | RECTAL | Status: DC
  Filled 2023-02-16: qty 1

## 2023-02-16 MED ORDER — SENNOSIDES-DOCUSATE SODIUM 8.6-50 MG PO TABS
2.0000 | ORAL_TABLET | Freq: Every day | ORAL | Status: DC
  Administered 2023-02-16 – 2023-02-18 (×3): 2 via ORAL
  Filled 2023-02-16 (×3): qty 2

## 2023-02-16 MED ORDER — SIMETHICONE 80 MG PO CHEW: 80.0000 mg | CHEWABLE_TABLET | Freq: Four times a day (QID) | ORAL | Status: DC | PRN

## 2023-02-16 NOTE — Progress Notes (Signed)
PT Cancellation Note  Patient Details Name: Heather Malone MRN: [id number] DOB: December 14, 1990   Cancelled Treatment:    Reason Eval/Treat Not Completed: Other (comment); attempted twice this am.  Initially pt eating and waiting on meds, then needing to stay on fetal monitor.  Will continue attempts.    [name] 02/16/2023, 1:16 PM Sheran Lawless, PT Acute Rehabilitation Services Office:[phone] 02/16/2023

## 2023-02-16 NOTE — Progress Notes (Signed)
FACULTY PRACTICE ANTEPARTUM PROGRESS NOTE  Heather Malone is a 33 y.o. [id number] at [redacted]w[redacted]d who is admitted for abdominal pain.  Estimated Date of Delivery: 06/04/23 Fetal presentation is unsure.  Length of Stay:  4 Days. Admitted 02/11/2023  Subjective: Pt seen.  She still notes mild to moderate inguinal and periumbilical pain.  Chart reviewed in detail.  No objective findings of infection or etiology of pain.  Generally surgery and MFM consults reviewed.  Possible musculoskeletal involvement, but no other source verified.  Nursing staff does note, pt has not had a bowel movement in a week. Patient reports normal fetal movement.  She denies uterine contractions, denies bleeding and leaking of fluid per vagina.  Vitals:  Blood pressure 102/66, pulse 78, temperature 97.7 F (36.5 C), temperature source Oral, resp. rate 17, last menstrual period 08/18/2022, SpO2 99%. Physical Examination: CONSTITUTIONAL: Well-developed, well-nourished female in no acute distress. Eating breakfast HENT:  Normocephalic, atraumatic, External right and left ear normal. Oropharynx is clear and moist EYES: Conjunctivae and EOM are normal. Pupils are equal, round, and reactive to light. No scleral icterus.  NECK: Normal range of motion, supple, no masses. SKIN: Skin is warm and dry. No rash noted. Not diaphoretic. No erythema. No pallor. NEUROLGIC: Alert and oriented to person, place, and time. Normal reflexes, muscle tone coordination. No cranial nerve deficit noted. PSYCHIATRIC: Normal mood and affect. Normal behavior. Normal judgment and thought content. CARDIOVASCULAR: deferred RESPIRATORY:deferred MUSCULOSKELETAL: Normal range of motion. No edema and no tenderness. ABDOMEN: deferred CERVIX: deferred  Fetal monitoring: FHR: 155 bpm,  Fetal strip is pending  Results for orders placed or performed during the hospital encounter of 02/11/23 (from the past 48 hours)  Troponin I (High Sensitivity)     Status: None    Collection Time: 02/14/23 12:57 PM  Result Value Ref Range   Troponin I (High Sensitivity) 2 <18 ng/L    Comment: (NOTE) Elevated high sensitivity troponin I (hsTnI) values and significant  changes across serial measurements may suggest ACS but many other  chronic and acute conditions are known to elevate hsTnI results.  Refer to the "Links" section for chest pain algorithms and additional  guidance. Performed at [facility], [address], [place], [place] [address]   Troponin I (High Sensitivity)     Status: None   Collection Time: 02/14/23  4:42 PM  Result Value Ref Range   Troponin I (High Sensitivity) 3 <18 ng/L    Comment: (NOTE) Elevated high sensitivity troponin I (hsTnI) values and significant  changes across serial measurements may suggest ACS but many other  chronic and acute conditions are known to elevate hsTnI results.  Refer to the "Links" section for chest pain algorithms and additional  guidance. Performed at [facility], [address] [place], [place] [address]     I have reviewed the patient's current medications.  ASSESSMENT: Principal Problem:   Abdominal pain affecting pregnancy Active Problems:   Anemia in pregnancy   Abdominal pain in pregnancy, second trimester   PLAN: Constipation may be contributing to some degree of discomfort.  Will add senokot and miralax to meds.  Advise increase hydration. No objective findings noted on radiographic studies. PT will see patient today; however, pt is resistant to consideration that she may have some lingering discomfort for the remainder of her pregnancy. This is within the normal spectrum of pregnancy.  Again consulted with MFM who agrees pain may not be resolved by time of discharge.  MFM  advised reassessment after PT evaluation.    Continue routine antenatal care.   Mariel Aloe, MD [facility] Faculty Attending, [facility] 02/16/2023 10:21 AM

## 2023-02-16 NOTE — Evaluation (Signed)
Physical Therapy Evaluation Patient Details Name: Heather Malone MRN: [id number] DOB: 18-May-1990 Today's Date: 02/16/2023  History of Present Illness  Patient is a 33 y/o female G3P1102 [redacted]w[redacted]d admitted 02/11/23 with R groin/abdominal radiating pain, workup negative, possible constipation or musculoskeletal pain.  PMH positive for migraines, pre-eclampsia, placental abruption in third trimester, and anemia.  Clinical Impression  Patient presents with decreased mobility due to pain R LE and limited activity tolerance, decreased balance.  Previously living with her two young children completing all household chores, care for kids, cooking, etc.  She was able to sit up and walk with RW into hallway today with S to CGA.  Noted no significant asymmetry in pelvis, though educated in nerve gliding for help with pain, use of modalities and wearing her belly/sacral support at home.  Also discussed possible outpatient follow up if feels not improving.  PT will follow up in am for stair training as she lives her bedrooms are upstairs.         If plan is discharge home, recommend the following: Assistance with cooking/housework;Assist for transportation;Help with stairs or ramp for entrance   Can travel by private vehicle        Equipment Recommendations Rolling walker (2 wheels)  Recommendations for Other Services       Functional Status Assessment Patient has had a recent decline in their functional status and demonstrates the ability to make significant improvements in function in a reasonable and predictable amount of time.     Precautions / Restrictions Precautions Precautions: Fall      Mobility  Bed Mobility Overal bed mobility: Needs Assistance Bed Mobility: Rolling, Sidelying to Sit Rolling: Supervision Sidelying to sit: Supervision, Used rails       General bed mobility comments: cues for technique for spinal precautions for comfort    Transfers Overall transfer level: Needs  assistance Equipment used: Rolling walker (2 wheels), 1 person hand held assist Transfers: Sit to/from Stand Sit to Stand: Supervision           General transfer comment: cues for hand placement with RW    Ambulation/Gait Ambulation/Gait assistance: Supervision, Contact guard assist Gait Distance (Feet): 100 Feet (& 20' with HHA) Assistive device: Rolling walker (2 wheels) Gait Pattern/deviations: Step-to pattern, Decreased stance time - right, Antalgic       General Gait Details: keeping heel on R foot up as reports pain to lay it flat, encouraged to try to get it flat as able; initially walked in room with HHA, then obtained walker for hallway ambulation  Stairs            Wheelchair Mobility     Tilt Bed    Modified Rankin (Stroke Patients Only)       Balance Overall balance assessment: Needs assistance   Sitting balance-Leahy Scale: Good     Standing balance support: No upper extremity supported Standing balance-Leahy Scale: Fair Standing balance comment: initial standing without support                             Pertinent Vitals/Pain Pain Assessment Pain Assessment: 0-10 Pain Score: 7  Pain Location: R side abdomen to leg Pain Descriptors / Indicators: Shooting, Sharp, Tender, Constant Pain Intervention(s): Monitored during session, Premedicated before session    Home Living Family/patient expects to be discharged to:: Private residence Living Arrangements: Children Available Help at Discharge: Family;Available PRN/intermittently Type of Home: House Home Access: Level entry  Alternate Level Stairs-Number of Steps: flight Home Layout: Two level Home Equipment: None Additional Comments: lives with children 8 & 2 and mom can help if needed    Prior Function Prior Level of Function : Independent/Modified Independent             Mobility Comments: independent with cooking, cleaning, etc, does not work outside the home        Extremity/Trunk Assessment   Upper Extremity Assessment Upper Extremity Assessment: Overall WFL for tasks assessed    Lower Extremity Assessment Lower Extremity Assessment: RLE deficits/detail RLE Deficits / Details: able to lift antigravity but painful, able to demonstrate ankle DF though limited compared to L RLE Sensation: WNL       Communication   Communication Communication: No apparent difficulties  Cognition Arousal: Alert Behavior During Therapy: WFL for tasks assessed/performed Overall Cognitive Status: Within Functional Limits for tasks assessed                                          General Comments General comments (skin integrity, edema, etc.): mother in the room and supportive. Educated on possible causes of radiating pain and discussed use of ice vs heat if helps; palpation to ASIS, iliac crest, pubis and check leg length without noted asymmetry in pelvis    Exercises Other Exercises Other Exercises: Educated and AAROM for nerve gliding on R with ankle pumps then heel slides and then knee extension; issued for HEP Other Exercises: Also educated on figure 4 piriformis stretch with handout   Assessment/Plan    PT Assessment Patient needs continued PT services  PT Problem List Decreased activity tolerance;Decreased mobility;Pain;Decreased knowledge of use of DME;Decreased safety awareness       PT Treatment Interventions DME instruction;Functional mobility training;Balance training;Gait training;Stair training;Therapeutic exercise;Therapeutic activities;Patient/family education;Manual techniques    PT Goals (Current goals can be found in the Care Plan section)  Acute Rehab PT Goals Patient Stated Goal: get pain under control PT Goal Formulation: With patient/family Time For Goal Achievement: 02/23/23 Potential to Achieve Goals: Good    Frequency Min 1X/week     Co-evaluation               AM-PAC PT "6 Clicks" Mobility   Outcome Measure Help needed turning from your back to your side while in a flat bed without using bedrails?: None Help needed moving from lying on your back to sitting on the side of a flat bed without using bedrails?: None Help needed moving to and from a bed to a chair (including a wheelchair)?: None Help needed standing up from a chair using your arms (e.g., wheelchair or bedside chair)?: None Help needed to walk in hospital room?: A Little Help needed climbing 3-5 steps with a railing? : A Little 6 Click Score: 22    End of Session   Activity Tolerance: Patient tolerated treatment well Patient left: with call bell/phone within reach;in bed;with family/visitor present   PT Visit Diagnosis: Difficulty in walking, not elsewhere classified (R26.2);Pain Pain - Right/Left: Right Pain - part of body: Leg    Time: 5784-6962 PT Time Calculation (min) (ACUTE ONLY): 49 min   Charges:   PT Evaluation $PT Eval Moderate Complexity: 1 Mod PT Treatments $Gait Training: 8-22 mins $Therapeutic Activity: 8-22 mins PT General Charges $$ ACUTE PT VISIT: 1 Visit         Sheran Lawless, PT  Acute Rehabilitation Services Office:[phone] 02/16/2023   [name] 02/16/2023, 4:55 PM

## 2023-02-16 NOTE — Progress Notes (Signed)
Inpatient Rehab Admissions Coordinator:   Consult received and chart reviewed.  Pt admitted for abdominal pain in second trimester.  Workup for acute cause has been negative outside of possible msk etiology, and medical team feels pt does not require ongoing hospitalization.  In order to qualify for CIR pt needs to have significant needs in at least 2 therapy disciplines, medical necessity to be followed daily by a physiatrist, and a coordinated team approach with rehabilitation nursing and social work teams.  Pt does not meet criteria for CIR at this time.  We will sign off.   Estill Dooms, PT, DPT Admissions Coordinator [phone] 02/16/23  2:59 PM

## 2023-02-16 NOTE — Consult Note (Signed)
NEUROLOGY CONSULT NOTE   Date of service: February 16, 2023 Patient Name: Heather Malone MRN:  [id number] DOB:  03-Apr-1990 Chief Complaint: "abdominal pain" Requesting Provider: [place] Bing, MD  History of Present Illness  Heather Malone is a 33 y.o. female  has a past medical history of Anemia, Anxiety, Asthma, Headache, trichomoniasis, varicella, Neck injury, subsequent encounter (07/26/2016), Placental abruption in third trimester (09/04/2014), Pre-eclampsia, Pregnancy induced hypertension, Subchorionic hemorrhage in third trimester (07/26/2014), and Velamentous insertion of umbilical cord., who presents with abdominal pain  Pain started early Wednesday morning. Pain at the belly button and then shoots down the front of the right leg.  Describes the pain as a sharp and shooting.  States it feels like "nerve pain".  At the worst it has been a 10 out of 10, states oxycodone brings it to approximately a 7 out of 10 which is where she is at right now.   Headache started when she got pregnant. Does have a hx of migraines. Typically 2 a month until she got pregnant, now persistent headache.  Currently does not have a headache at time of NP evaluation, but on follow up attending evaluation her headache had returned, described as bifrontal and bioccipital.   Previously on gabapentin and flexeril due to issues with lower back pain during the pregnancy, has not helped. Oxycodone makes the pain tolerable, last dose brought her pain from a 10/10 -> 7/10. Right now her abdominal pain is at a 7/10  Of note, she has not had a bowel movement in a week.   With her 2 prior pregnancies, she did not have the currently endorsed constellation of symptoms.   ROS  Comprehensive ROS performed and pertinent positives documented in HPI   Past History   Past Medical History:  Diagnosis Date   Anemia    Anxiety    Asthma    albuterol prn   Headache    migraines   Hx of trichomoniasis    Hx of  varicella    Neck injury, subsequent encounter 07/26/2016   Placental abruption in third trimester 09/04/2014   Pre-eclampsia    Pregnancy induced hypertension    Subchorionic hemorrhage in third trimester 07/26/2014   Velamentous insertion of umbilical cord     Past Surgical History:  Procedure Laterality Date   TONSILLECTOMY      Family History: Family History  Problem Relation Age of Onset   Hypertension Mother    Hypertension Maternal Grandmother    Heart attack Maternal Grandmother    Hypertension Maternal Grandfather    Cancer Maternal Grandfather    Diabetes Paternal Grandmother     Social History  reports that she has quit smoking. Her smoking use included cigars. She has never used smokeless tobacco. She reports that she does not currently use alcohol. She reports that she does not currently use drugs after having used the following drugs: Marijuana.  No Known Allergies  Medications   Current Facility-Administered Medications:    acetaminophen (TYLENOL) tablet 650 mg, 650 mg, Oral, Q4H PRN, Warren-Hill, Sara A, CNM, 650 mg at 02/16/23 0433   [START ON 02/17/2023] bisacodyl (DULCOLAX) suppository 10 mg, 10 mg, Rectal, Once, [place]arden Fillers, MD   calcium carbonate (TUMS - dosed in mg elemental calcium) chewable tablet 400 mg of elemental calcium, 2 tablet, Oral, Q4H PRN, Warren-Hill, Sara A, CNM   cyclobenzaprine (FLEXERIL) tablet 10 mg, 10 mg, Oral, Q8H PRN, Adam Phenix, MD, 10 mg at 02/16/23 7829   doxylamine (  Sleep) (UNISOM) tablet 25 mg, 25 mg, Oral, QHS PRN, Lazaro Arms, MD, 25 mg at 02/15/23 1859   enoxaparin (LOVENOX) injection 40 mg, 40 mg, Subcutaneous, Q24H, [place] Bing, MD, 40 mg at 02/16/23 1235   famotidine (PEPCID) tablet 20 mg, 20 mg, Oral, BID, [place] Bing, MD, 20 mg at 02/16/23 1009   [COMPLETED] indomethacin (INDOCIN) capsule 50 mg, 50 mg, Oral, Once, 50 mg at 02/15/23 0957 **FOLLOWED BY** indomethacin (INDOCIN) capsule 25 mg, 25 mg,  Oral, Q6H, Adam Phenix, MD, 25 mg at 02/16/23 1236   oxyCODONE (Oxy IR/ROXICODONE) immediate release tablet 5-10 mg, 5-10 mg, Oral, Q6H PRN, Lazaro Arms, MD, 10 mg at 02/16/23 1330   polyethylene glycol (MIRALAX / GLYCOLAX) packet 17 g, 17 g, Oral, QODAY, Warden Fillers, MD, 17 g at 02/16/23 1236   prenatal multivitamin tablet 1 tablet, 1 tablet, Oral, Q1200, Warren-Hill, Clarita Crane, CNM, 1 tablet at 02/16/23 1235   promethazine (PHENERGAN) tablet 25 mg, 25 mg, Oral, Q6H PRN, [place] Bing, MD, 25 mg at 02/14/23 1057   senna-docusate (Senokot-S) tablet 2 tablet, 2 tablet, Oral, Daily, Warden Fillers, MD, 2 tablet at 02/16/23 1236   simethicone (MYLICON) chewable tablet 80 mg, 80 mg, Oral, QID PRN, Warden Fillers, MD  Vitals   Vitals:   02/16/23 2595 02/16/23 0805 02/16/23 1148 02/16/23 1543  BP: 102/68 102/66 115/77 104/65  Pulse: 80 78 96 85  Resp: 16 17 18 16   Temp: 98 F (36.7 C) 97.7 F (36.5 C) 97.8 F (36.6 C) 97.7 F (36.5 C)  TempSrc: Oral Oral Oral Oral  SpO2: 96% 99% 98% 98%    There is no height or weight on file to calculate BMI.  Physical Exam   Constitutional: Appears well-developed and well-nourished.  Psych: Affect appropriate to situation.  Eyes: No scleral injection.  HENT: No OP obstruction.  Head: Normocephalic.  Cardiovascular: Normal rate and regular rhythm.  Respiratory: Effort normal, non-labored breathing.  GI: Soft.  No distension. There is no tenderness.  Skin: WDI.   Neurologic Examination    Mental Status: Patient is awake, alert, oriented to person, place, month, year, and situation. Patient is able to give a clear and coherent history. No signs of aphasia or neglect Cranial Nerves: II: Visual Fields are full. Pupils are equal, round, and reactive to light.   III,IV, VI: EOMI without ptosis or diploplia.  V: Facial sensation is symmetric to temperature VII: Facial movement is symmetric resting and smiling VIII: Hearing is  intact to voice X: Palate elevates symmetrically XI: Shoulder shrug is symmetric. XII: Tongue protrudes midline without atrophy or fasciculations.  Motor: Tone is normal. Bulk is normal. RUE and LUE 5/5 strength RLE 4/5- limited by pain, no drift, plantar and dorsiflexion weaker as well. Does seem effort dependent with an overlay of increased pain. She does state that if pain went away, she feels her strength would be normal.  LLE 5/5 Sensory: Sensation is symmetric to light touch and temperature in the arms and legs. No extinction to DSS present.  Right straight leg raise triggers mid and right back pain  Cerebellar: FNF and HKS are intact bilaterally   Labs/Imaging/Neurodiagnostic studies   CBC:  Recent Labs  Lab 2023/02/23 1349 02/12/23 0501 02/13/23 0748  WBC 6.9 6.9 4.9  NEUTROABS 4.4 4.4  --   HGB 9.7* 8.9* 8.3*  HCT 30.2* 26.8* 25.4*  MCV 87.8 86.2 86.7  PLT 314 260 231    Basic Metabolic Panel:  Lab Results  Component Value Date   NA 135 02/13/2023   K 3.7 02/13/2023   CO2 21 (L) 02/13/2023   GLUCOSE 78 02/13/2023   BUN 6 02/13/2023   CREATININE 0.49 02/13/2023   CALCIUM 8.3 (L) 02/13/2023   GFRNONAA >60 02/13/2023   GFRAA >60 07/01/2018    Lipid Panel: No results found for: "LDLCALC"  HgbA1c:  Lab Results  Component Value Date   HGBA1C 5.6 10/29/2022    Urine Drug Screen:     Component Value Date/Time   LABOPIA NONE DETECTED 07/25/2014 0000   COCAINSCRNUR NONE DETECTED 07/25/2014 0000   LABBENZ NONE DETECTED 07/25/2014 0000   AMPHETMU NONE DETECTED 07/25/2014 0000   THCU NONE DETECTED 07/25/2014 0000   LABBARB NONE DETECTED 07/25/2014 0000     Alcohol Level No results found for: "ETH"  INR  Lab Results  Component Value Date   INR 1.06 07/25/2014    APTT  Lab Results  Component Value Date   APTT 28 07/25/2014    AED levels: No results found for: "PHENYTOIN", "ZONISAMIDE", "LAMOTRIGINE", "LEVETIRACETA"  ASSESSMENT  Lalani N  Verne is a 33 y.o. pregnant female who has a past medical history of Anemia, Anxiety, Asthma, Headache, trichomoniasis, varicella, Neck injury, subsequent encounter (07/26/2016), Placental abruption in third trimester (09/04/2014), Pre-eclampsia, Pregnancy induced hypertension, Subchorionic hemorrhage in third trimester (07/26/2014), and Velamentous insertion of umbilical cord., who presented with abdominal pain that started Wednesday morning. She has umbilical pain that radiates to the right leg as a sharp shooting pain. General surgery and maternal fetal medicine consulted and work up including MRI of abdomen and pelvis thus far has been unremarkable. Of note she has not had a bowel movement for approximately a week. Bowel regimen started.  - Exam reveals giveway weakness to RLE in conjunction with pain. She does state that if pain went away, she feels her strength would be normal. Right straight leg raise triggers mid and right back pain. She notes that the pain comes and goes, is sharp and radiates down the front of her leg down to her ankle, sparing her foot.  - Exam findings do not localize to the brain or spinal cord. A lumbar radiculopathy is on the DDx.   RECOMMENDATIONS  - MRI of lumbar spine. Do not use contrast as she is pregnant.  - Bowel regimen  - Massage to RLE, stretching exercises, hot/cold pads and ambulation with PT if MRI is negative for disc protrusion.  - Pain management per primary team ______________________________________________________________________    I have seen and examined the patient. I have reviewed the assessment and recommendations and made amendations as needed. 33 year old pregnant female presenting with abdominal pain as well as RLE weakness in association with LBP shooting down the front of her right thigh and leg, sparing the foot. Exam reveals giveway weakness and pain to RLE. DDx and recommendations as documented above.  Electronically signed: Dr. Caryl Pina

## 2023-02-17 ENCOUNTER — Telehealth: Payer: Self-pay

## 2023-02-17 ENCOUNTER — Inpatient Hospital Stay (HOSPITAL_COMMUNITY): Payer: Medicaid Other

## 2023-02-17 DIAGNOSIS — Z3A24 24 weeks gestation of pregnancy: Secondary | ICD-10-CM

## 2023-02-17 DIAGNOSIS — O212 Late vomiting of pregnancy: Secondary | ICD-10-CM

## 2023-02-17 DIAGNOSIS — O09292 Supervision of pregnancy with other poor reproductive or obstetric history, second trimester: Secondary | ICD-10-CM

## 2023-02-17 DIAGNOSIS — O26899 Other specified pregnancy related conditions, unspecified trimester: Secondary | ICD-10-CM

## 2023-02-17 DIAGNOSIS — O09212 Supervision of pregnancy with history of pre-term labor, second trimester: Secondary | ICD-10-CM

## 2023-02-17 DIAGNOSIS — M5116 Intervertebral disc disorders with radiculopathy, lumbar region: Secondary | ICD-10-CM | POA: Diagnosis not present

## 2023-02-17 DIAGNOSIS — O402XX0 Polyhydramnios, second trimester, not applicable or unspecified: Secondary | ICD-10-CM | POA: Diagnosis not present

## 2023-02-17 DIAGNOSIS — R109 Unspecified abdominal pain: Secondary | ICD-10-CM | POA: Diagnosis not present

## 2023-02-17 DIAGNOSIS — O26892 Other specified pregnancy related conditions, second trimester: Secondary | ICD-10-CM | POA: Diagnosis not present

## 2023-02-17 DIAGNOSIS — D563 Thalassemia minor: Secondary | ICD-10-CM | POA: Diagnosis not present

## 2023-02-17 DIAGNOSIS — O99012 Anemia complicating pregnancy, second trimester: Secondary | ICD-10-CM | POA: Diagnosis not present

## 2023-02-17 LAB — GLUCOSE, 1 HOUR GESTATIONAL: Glucose Tolerance, 1 hour: 131 mg/dL

## 2023-02-17 MED ORDER — FLEET ENEMA RE ENEM
1.0000 | ENEMA | Freq: Once | RECTAL | Status: AC
  Administered 2023-02-17: 1 via RECTAL

## 2023-02-17 MED ORDER — METHYLPREDNISOLONE SODIUM SUCC 125 MG IJ SOLR
125.0000 mg | Freq: Once | INTRAMUSCULAR | Status: AC
  Administered 2023-02-17: 125 mg via INTRAVENOUS
  Filled 2023-02-17: qty 2

## 2023-02-17 NOTE — Consult Note (Incomplete)
MFM Inpatient Consult Note Patient Name: Heather Malone  Patient MRN:   [id number]  Referring provider: Milas Hock  Reason for Consult: Abdominal pain   HPI: Heather Malone is a 33 y.o. [id number] at [redacted]w[redacted]d admitted for abdominal pain of uncertain etiology.  The patient has been admitted for 5 days and undergone numerous imaging modalities as well as oral medications to attempt to improve her pain.  She has had an MRI that was negative for any acute or chronic intra-abdominal process.  She had a lumbar MRI that was negative for herniation of the discs.  She reports that some of the medications seem to have a small efficacy but nothing completely takes her pain away.  Notably she did not have a bowel movement in 5 days.  She was started on a comprehensive bowel regimen today with the attempt of producing stool output.  She is produced some stool but reports that is not of significant quantity.   Today she denies vaginal bleeding, contractions, or loss of fluid.   Neurology was consulted as well as general surgery and no further recommendations were made based on the imaging and physical exam findings.  Based on physical exam findings I discussed the possibility of myofascial pain with the patient I also discussed the potential of a placental abruption.  The patient reports that she has a history of placental abruption and a previous pregnancy where she also had preeclampsia.  We discussed the diagnosis management and risk factors for placental abruption.  I discussed my concern for this is low at this time but will perform an ultrasound to assess for any sonographic evidence of placental hematoma.  I discussed that the diagnosis of abruption is a clinical diagnosis and is not made based on imaging but imaging can support the diagnosis.  At this time my leading diagnosis is myofascial pain and recommend continued physical therapy, belly band and symptom monitoring.  I also discussed to monitor for vaginal  bleeding and severe abdominal pain in the event she has a recurrent abruption.  She verbalized understanding and is agreeable to the plan outlined below.  She is also agreeable to discharge tomorrow after she completes her bowel regimen.  Review of Systems: A review of systems was performed and was negative except per HPI   Past Obstetrical History:  OB History  Gravida Para Term Preterm AB Living  3 2 1 1  0 2  SAB IAB Ectopic Multiple Live Births  0 0 0 0 2    # Outcome Date GA Lbr Len/2nd Weight Sex Type Anes PTL Lv  3 Current           2 Preterm 09/12/20 [redacted]w[redacted]d 03:28 1900 g F Vag-Spont None  LIV     Birth Comments: IOL for severe pre-eclampsia  1 Term 09/05/14 [redacted]w[redacted]d 1578:09 / 00:10 3340 g M Vag-Spont None  LIV     Birth Comments: placenta abrution, velamentous cord insertion     Past Gynecologic History:  Not discussed  Past Medical History:  Past Medical History:  Diagnosis Date   Anemia    Anxiety    Asthma    albuterol prn   Headache    migraines   Hx of trichomoniasis    Hx of varicella    Neck injury, subsequent encounter 07/26/2016   Placental abruption in third trimester 09/04/2014   Pre-eclampsia    Pregnancy induced hypertension    Subchorionic hemorrhage in third trimester 07/26/2014   Velamentous insertion of umbilical  cord       Past Surgical History:    Past Surgical History:  Procedure Laterality Date   TONSILLECTOMY       Family History:   family history includes Cancer in her maternal grandfather; Diabetes in her paternal grandmother; Heart attack in her maternal grandmother; Hypertension in her maternal grandfather, maternal grandmother, and mother.   Social History:   Social History   Socioeconomic History   Marital status: Single    Spouse name: Not on file   Number of children: Not on file   Years of education: Not on file   Highest education level: Not on file  Occupational History   Not on file  Tobacco Use   Smoking status:  Former    Types: Cigars   Smokeless tobacco: Never  Vaping Use   Vaping status: Never Used  Substance and Sexual Activity   Alcohol use: Not Currently    Comment: weekends   Drug use: Not Currently    Types: Marijuana    Comment: none with + UPT   Sexual activity: Yes    Birth control/protection: None  Other Topics Concern   Not on file  Social History Narrative   Not on file   Social Drivers of Health   Financial Resource Strain: Not on file  Food Insecurity: No Food Insecurity (02/13/2023)   Hunger Vital Sign    Worried About Running Out of Food in the Last Year: Never true    Ran Out of Food in the Last Year: Never true  Transportation Needs: No Transportation Needs (02/13/2023)   PRAPARE - Administrator, Civil Service (Medical): No    Lack of Transportation (Non-Medical): No  Physical Activity: Not on file  Stress: Not on file  Social Connections: Not on file  Intimate Partner Violence: Not At Risk (02/13/2023)   Humiliation, Afraid, Rape, and Kick questionnaire    Fear of Current or Ex-Partner: No    Emotionally Abused: No    Physically Abused: No    Sexually Abused: No      Home Medications:   No current facility-administered medications on file prior to encounter.   Current Outpatient Medications on File Prior to Encounter  Medication Sig Dispense Refill   cyclobenzaprine (FLEXERIL) 10 MG tablet Take 1 tablet (10 mg total) by mouth every 8 (eight) hours as needed for muscle spasms. 30 tablet 1   acetaminophen (TYLENOL) 500 MG tablet Take 1,000 mg by mouth every 6 (six) hours as needed for moderate pain (pain score 4-6).     butalbital-acetaminophen-caffeine (FIORICET) 50-325-40 MG tablet Take 2 tablets by mouth 2 (two) times daily as needed for headache.     Doxylamine-Pyridoxine ER (BONJESTA) 20-20 MG TBCR Take 1 tablet by mouth at bedtime. 60 tablet 3   gabapentin (NEURONTIN) 300 MG capsule Take 1 capsule (300 mg total) by mouth 3 (three) times daily  as needed. 90 capsule 2   ondansetron (ZOFRAN) 8 MG tablet Take 1 tablet (8 mg total) by mouth every 8 (eight) hours as needed for nausea or vomiting. Can take miralax in apple juice if needed for constipation. (Patient not taking: Reported on 02/11/2023) 20 tablet 0   Prenatal Vit-Fe Fumarate-FA (MULTIVITAMIN-PRENATAL) 27-0.8 MG TABS tablet Take 1 tablet by mouth daily at 12 noon. 30 tablet 0   promethazine (PHENERGAN) 12.5 MG tablet Take 1 tablet (12.5 mg total) by mouth every 6 (six) hours as needed for nausea or vomiting. 30 tablet 0   triamcinolone (  KENALOG) 0.025 % ointment Apply 1 Application topically 2 (two) times daily. Do not use for longer than 7 days 30 g 0      Allergies:   No Known Allergies   Physical Exam:   Vitals:   02/17/23 1538 02/17/23 1928  BP: 113/68 114/71  Pulse: 94 98  Resp: 18 18  Temp: 98.3 F (36.8 C) 98.6 F (37 C)  SpO2: 98% 99%   Sitting comfortably on the hospital bed  Nonlabored breathing Normal rate and rhythm Abdomen is tender with myofacial manipulation.  Uterus is mildly tender, but without rebound or guarding  Assessment  Heather Malone is a 33 y.o. [id number] at [redacted]w[redacted]d admitted for the following. MFM consult was placed for abdominal pain.  Abdominal pain affecting pregnancy  At this time the most likely diagnosis is musculoskeletal or fascial pain.  Due to her history of placental abruption and mild uterine tenderness this should be on the differential but is less likely given her absence of contractions or bleeding or sonographic evidence of hematoma.  Other concerning possibilities such as appendicitis, kidney stone or intra-abdominal mass has been ruled out.  Lumbar radiculopathy has also been ruled out.   recommendations -Limited ultrasound ordered to assess for placental hematoma or other obstetrics causes of pain -Continue medications for pain management only if they provide significant benefit. medications as needed for pain.   Recommend discontinuing oxycodone since the most likely etiology is musculoskeletal  pain which is not treated with opiates. -She has completed a course of Indocin with some release. Due to concern for premature closure of the ductus arteriosus a course duration of 48 hours or less is recommended.  She will try a short course of steroids in the event that there is some inflammation causing her pain.  She passed her GTT and therefore is not at increased risk for gestational diabetes. -Status post general surgery and neurology consultation with no identifiable causes abdominal pain -Continue bowel regimen -Continue NST daily when appropriate -Disposition: Discharge tomorrow if patient continues to improve clinically -An outpatient ultrasound to be arranged in 2 weeks to assess the placenta due to her history of placental abruption in the setting of chronic abdominal pain serial growth ultrasound should also occur.  Every 4 weeks due to her history of preeclampsia and placental abruption  70 minutes was spent in total reviewing the patient's clinical notes, laboratory values, imaging and discussion with the consulting provider.  This time also included direct patient to provider counseling.  All of her questions were answered to her satisfaction.  Thank you for the opportunity to be involved with this patient's care. Please let us know if we can be of any further assistance.   Braxton Feathers, DO MFM, [place]   02/17/2023  8:00 PM

## 2023-02-17 NOTE — Plan of Care (Signed)
  Problem: Education: Goal: Knowledge of disease or condition will improve Outcome: Progressing Goal: Knowledge of the prescribed therapeutic regimen will improve Outcome: Progressing   Problem: Clinical Measurements: Goal: Complications related to the disease process, condition or treatment will be avoided or minimized Outcome: Progressing   Problem: Health Behavior/Discharge Planning: Goal: Ability to manage health-related needs will improve Outcome: Progressing   Problem: Clinical Measurements: Goal: Ability to maintain clinical measurements within normal limits will improve Outcome: Progressing Goal: Will remain free from infection Outcome: Progressing Goal: Diagnostic test results will improve Outcome: Progressing   Problem: Elimination: Goal: Will not experience complications related to bowel motility Outcome: Progressing Goal: Will not experience complications related to urinary retention Outcome: Progressing   Problem: Pain Managment: Goal: General experience of comfort will improve and/or be controlled Outcome: Progressing   Problem: Safety: Goal: Ability to remain free from injury will improve Outcome: Progressing   Problem: Skin Integrity: Goal: Risk for impaired skin integrity will decrease Outcome: Progressing

## 2023-02-17 NOTE — Social Work (Signed)
CSW notified RNCM that patient had consult for DME.   Vivi Barrack, MSW, LCSW Clinical Social Worker  [phone] 02/17/2023  1:53PM

## 2023-02-17 NOTE — Telephone Encounter (Signed)
Auth Submission: NO AUTH NEEDED Site of care: Site of care: [facility] Payer: [facility] Medicaid Medication & CPT/J Code(s) submitted: Solumedrol (Methylprednisolone) J2930 Route of submission (phone, fax, portal):  Phone # Fax # Auth type: Buy/Bill PB Units/visits requested: x 1 dose Reference number:  Approval from: 12/10/22 to 01/27/24

## 2023-02-17 NOTE — TOC Initial Note (Signed)
Transition of Care [facility]) - Initial/Assessment Note    Patient Details  Name: Heather Malone MRN: [id number] Date of Birth: Nov 20, 1990  [facility] CM/SW Contact:    Geoffery Lyons, RN Phone Number:[phone] 02/17/2023, 2:19 PM  Clinical Narrative:                  Heather Malone is a 33 y.o. [id number] at [redacted]w[redacted]d who is admitted for abdominal pain    CM received message from CSW that patient had consult for DME. CM received order from MD for rolling walker.  CM spoke to patient's mother on phone and walker was ordered from [employer]. CM spoke to [place] and he accepted referral and shared that he would deliver rolling walker to patient's room prior to discharge.  No other needs or barriers at this time. Patient will follow up with [facility] after discharge. MD plans to set up outpatient PT ( per note).  Expected Discharge Plan and Services Dc home    Activities of Daily Living   ADL Screening (condition at time of admission) Independently performs ADLs?: Yes (appropriate for developmental age) Is the patient deaf or have difficulty hearing?: No Does the patient have difficulty seeing, even when wearing glasses/contacts?: No Does the patient have difficulty concentrating, remembering, or making decisions?: No   Admission diagnosis:  Abdominal pain affecting pregnancy [O26.899, R10.9] Abdominal pain in pregnancy, second trimester [O26.892, R10.9] Patient Active Problem List   Diagnosis Date Noted   Abdominal pain in pregnancy, second trimester 02/12/2023   Abdominal pain affecting pregnancy 02/11/2023   Hyperemesis 12/10/2022   Supervision of high-risk pregnancy 10/29/2022   History of severe pre-eclampsia 10/29/2022   Elevated blood pressure complicating pregnancy in first trimester, antepartum 10/01/2022   Anemia in pregnancy 08/05/2020   Alpha thalassemia silent carrier 08/02/2020   Chronic headaches 04/12/2020   PCP:  Patient, No Pcp Per Pharmacy:    [facility] [phone] - [place], [place] - [address] AT [facility] [address] [place] [address] Phone: [phone] Fax: [phone]  [facility] [address] [place] [address] Phone: [phone] Fax: [phone]     Social Drivers of Health (SDOH) Social History: SDOH Screenings   Food Insecurity: No Food Insecurity (02/13/2023)  Housing: Low Risk  (02/13/2023)  Transportation Needs: No Transportation Needs (02/13/2023)  Utilities: Not At Risk (02/13/2023)  Depression (PHQ2-9): Low Risk  (01/13/2023)  Recent Concern: Depression (PHQ2-9) - Medium Risk (12/16/2022)  Tobacco Use: Medium Risk (02/13/2023)   SDOH Interventions:     Readmission Risk Interventions     No data to display

## 2023-02-17 NOTE — Progress Notes (Signed)
Physical Therapy Treatment Patient Details Name: Heather Malone MRN: [id number] DOB: August 26, 1990 Today's Date: 02/17/2023   History of Present Illness Patient is a 33 y/o female G3P1102 [redacted]w[redacted]d admitted 02/11/23 with R groin/abdominal radiating pain, workup negative, possible constipation or musculoskeletal pain.  PMH positive for migraines, pre-eclampsia, placental abruption in third trimester, and anemia.    PT Comments  Patient progressing with mobility and confidence with RW.  Able to negotiate stairs appropriate to access second floor with bedroom at home.  She will have assistance and able to educate caregivers how to help.  She will need RW for home and reports outpatient PT set up already.  Stable for home, but PT will follow if not d/c.     If plan is discharge home, recommend the following: Assistance with cooking/housework;Assist for transportation;Help with stairs or ramp for entrance   Can travel by private vehicle        Equipment Recommendations  Rolling walker (2 wheels)    Recommendations for Other Services       Precautions / Restrictions Precautions Precautions: Fall     Mobility  Bed Mobility Overal bed mobility: Modified Independent             General bed mobility comments: using spinal protection techniques    Transfers Overall transfer level: Needs assistance Equipment used: Rolling walker (2 wheels) Transfers: Sit to/from Stand Sit to Stand: Supervision           General transfer comment: reminders for hand placement    Ambulation/Gait Ambulation/Gait assistance: Supervision Gait Distance (Feet): 80 Feet Assistive device: Rolling walker (2 wheels) Gait Pattern/deviations: Step-to pattern, Decreased stance time - right, Antalgic       General Gait Details: increased time and still antalgic with R heel elevated   Stairs Stairs: Yes Stairs assistance: Min assist Stair Management: One rail Left, Step to pattern, Forwards (HHA on  R) Number of Stairs: 10 General stair comments: cues for sequence and A for hand hold on side opposite railing; patient feels confident to instruct whoever helps her.   Wheelchair Mobility     Tilt Bed    Modified Rankin (Stroke Patients Only)       Balance Overall balance assessment: Needs assistance   Sitting balance-Leahy Scale: Good       Standing balance-Leahy Scale: Fair Standing balance comment: in bathroom unaided with RW                            Cognition Arousal: Alert Behavior During Therapy: WFL for tasks assessed/performed Overall Cognitive Status: Within Functional Limits for tasks assessed                                          Exercises Other Exercises Other Exercises: performed seated hip rotator stretch prior to ambulation with demonstration.    General Comments General comments (skin integrity, edema, etc.): Educated on timing of medications and when she negotiates stairs.  Also discussed planning ahead for appointments to have children settled so she has time for herself and for pain control.  Educated on lymphatic massage to shunt extra fluid from R groin and on lymphatic system purpose.      Pertinent Vitals/Pain Pain Assessment Pain Assessment: Faces Faces Pain Scale: Hurts even more Pain Location: R side abdomen to leg Pain Descriptors / Indicators: Shooting, [place],  Tender, Constant Pain Intervention(s): Monitored during session, Premedicated before session, Ice applied    Home Living                          Prior Function            PT Goals (current goals can now be found in the care plan section) Progress towards PT goals: Progressing toward goals    Frequency           PT Plan      Co-evaluation              AM-PAC PT "6 Clicks" Mobility   Outcome Measure  Help needed turning from your back to your side while in a flat bed without using bedrails?: None Help needed  moving from lying on your back to sitting on the side of a flat bed without using bedrails?: None Help needed moving to and from a bed to a chair (including a wheelchair)?: None Help needed standing up from a chair using your arms (e.g., wheelchair or bedside chair)?: None Help needed to walk in hospital room?: None Help needed climbing 3-5 steps with a railing? : A Little 6 Click Score: 23    End of Session Equipment Utilized During Treatment: Gait belt Activity Tolerance: Patient tolerated treatment well Patient left: in bed;with call bell/phone within reach;with family/visitor present   PT Visit Diagnosis: Difficulty in walking, not elsewhere classified (R26.2);Pain Pain - Right/Left: Right Pain - part of body: Leg     Time: 1025-1057 PT Time Calculation (min) (ACUTE ONLY): 32 min  Charges:    $Gait Training: 8-22 mins $Therapeutic Activity: 8-22 mins PT General Charges $$ ACUTE PT VISIT: 1 Visit                     Heather Malone, PT Acute Rehabilitation Services Office:[phone] 02/17/2023    Heather Malone 02/17/2023, 11:27 AM

## 2023-02-17 NOTE — Discharge Summary (Incomplete)
Antenatal Physician Discharge Summary  Patient ID: Heather Malone MRN: [id number] DOB/AGE: Dec 02, 1990 33 y.o.  Admit date: 02/11/2023 Discharge date: 02/18/2023  Admission Diagnoses: Abdominal pain affecting pregnancy [O26.899, R10.9] Abdominal pain in pregnancy, second trimester [O26.892, R10.9]  Discharge Diagnoses:  Same  Prenatal Procedures: NST and ultrasound  Consults: Maternal Fetal Medicine, neurology, general surgery  Hospital Course:  Heather Malone is a 33 y.o. O1H0865 with IUP at [redacted]w[redacted]d admitted for abdominal pain on 1/15.  She was ruled out for preterm labor but then also had an MRI of her abdomen and pelvis and also her spine to rule out other serious causes of abdominal pain. Her imaging was normal and her labwork was also normal. She was started on a bowel regimen after noting no bowel movement for a week. She was started on IV steroids on 1/21 with plan to continue a short course of these as an outpatient. She saw physical therapy who recommended a walker until pain improved and to initiate outpatient physical therapy. She was started on a pain medication regimen to help control her pain. She had an [place] on 1/21 which also showed no evidence of abruption. She had fetal monitoring during her admission which was appropriate for gestational age.   On the day of discharge she reported less intense pain in her abdomen and leg and that it was coming/going. She required oxycodone the night before but otherwise slept through the night and did not take additional opioid based medication. She was deemed stable for discharge to home with outpatient follow up.  Discharge Exam: Temp:  [97.6 F (36.4 C)-98.6 F (37 C)] 97.6 F (36.4 C) (01/22 0849) Pulse Rate:  [79-98] 89 (01/22 0849) Resp:  [18] 18 (01/22 0849) BP: (104-114)/(62-71) 111/65 (01/22 0849) SpO2:  [98 %-99 %] 99 % (01/22 0849) Physical Examination: CONSTITUTIONAL: Well-developed, well-nourished female in no acute  distress.  HENT:  Normocephalic, atraumatic, External right and left ear normal.  EYES: Conjunctivae and EOM are normal. Pupils are equal, round, and reactive to light. No scleral icterus.  NECK: Normal range of motion, supple, no masses SKIN: Skin is warm and dry. No rash noted. Not diaphoretic. No erythema. No pallor. NEUROLOGIC: Alert and oriented to person, place, and time. Normal reflexes, muscle tone coordination. No cranial nerve deficit noted. PSYCHIATRIC: Normal mood and affect. Normal behavior. Normal judgment and thought content. CARDIOVASCULAR: Normal heart rate noted, regular rhythm RESPIRATORY: Effort and breath sounds normal, no problems with respiration noted MUSCULOSKELETAL: Normal range of motion. No edema and no tenderness. 2+ distal pulses. ABDOMEN: Soft, nontender, nondistended, gravid. CERVIX: Dilation: Closed Cervical Position: Posterior Station: Ballotable Exam by:: Dr. Vergie Living  Fetal monitoring: FHR: 150 bpm, Variability: moderate,  Decelerations: Absent  Uterine activity: no contractions   Significant Diagnostic Studies:  Results for orders placed or performed during the hospital encounter of 02/11/23 (from the past week)  Urinalysis, Routine w reflex microscopic -Urine, Clean Catch   Collection Time: 02/11/23 12:50 PM  Result Value Ref Range   Color, Urine YELLOW YELLOW   APPearance HAZY (A) CLEAR   Specific Gravity, Urine 1.012 1.005 - 1.030   pH 7.0 5.0 - 8.0   Glucose, UA NEGATIVE NEGATIVE mg/dL   Hgb urine dipstick NEGATIVE NEGATIVE   Bilirubin Urine NEGATIVE NEGATIVE   Ketones, ur NEGATIVE NEGATIVE mg/dL   Protein, ur NEGATIVE NEGATIVE mg/dL   Nitrite NEGATIVE NEGATIVE   Leukocytes,Ua TRACE (A) NEGATIVE   RBC / HPF 0-5 0 - 5 RBC/hpf  WBC, UA 0-5 0 - 5 WBC/hpf   Bacteria, UA NONE SEEN NONE SEEN   Squamous Epithelial / HPF 6-10 0 - 5 /HPF   Mucus PRESENT   CBC with Differential/Platelet   Collection Time: 02/11/23  1:49 PM  Result Value Ref  Range   WBC 6.9 4.0 - 10.5 K/uL   RBC 3.44 (L) 3.87 - 5.11 MIL/uL   Hemoglobin 9.7 (L) 12.0 - 15.0 g/dL   HCT 88.4 (L) 16.6 - 06.3 %   MCV 87.8 80.0 - 100.0 fL   MCH 28.2 26.0 - 34.0 pg   MCHC 32.1 30.0 - 36.0 g/dL   RDW 01.6 01.0 - 93.2 %   Platelets 314 150 - 400 K/uL   nRBC 0.0 0.0 - 0.2 %   Neutrophils Relative % 64 %   Neutro Abs 4.4 1.7 - 7.7 K/uL   Lymphocytes Relative 23 %   Lymphs Abs 1.6 0.7 - 4.0 K/uL   Monocytes Relative 9 %   Monocytes Absolute 0.6 0.1 - 1.0 K/uL   Eosinophils Relative 3 %   Eosinophils Absolute 0.2 0.0 - 0.5 K/uL   Basophils Relative 0 %   Basophils Absolute 0.0 0.0 - 0.1 K/uL   Immature Granulocytes 1 %   Abs Immature Granulocytes 0.04 0.00 - 0.07 K/uL  Comprehensive metabolic panel   Collection Time: 02/11/23  1:49 PM  Result Value Ref Range   Sodium 136 135 - 145 mmol/L   Potassium 4.4 3.5 - 5.1 mmol/L   Chloride 105 98 - 111 mmol/L   CO2 23 22 - 32 mmol/L   Glucose, Bld 82 70 - 99 mg/dL   BUN <5 (L) 6 - 20 mg/dL   Creatinine, Ser 3.55 0.44 - 1.00 mg/dL   Calcium 9.0 8.9 - 73.2 mg/dL   Total Protein 6.9 6.5 - 8.1 g/dL   Albumin 3.2 (L) 3.5 - 5.0 g/dL   AST 13 (L) 15 - 41 U/L   ALT 13 0 - 44 U/L   Alkaline Phosphatase 41 38 - 126 U/L   Total Bilirubin 0.6 0.0 - 1.2 mg/dL   GFR, Estimated >20 >25 mL/min   Anion gap 8 5 - 15  C-reactive protein   Collection Time: 02/11/23  1:49 PM  Result Value Ref Range   CRP 0.7 <1.0 mg/dL  Iron and TIBC   Collection Time: 02/11/23  1:49 PM  Result Value Ref Range   Iron 52 28 - 170 ug/dL   TIBC 427 (H) 062 - 376 ug/dL   Saturation Ratios 9 (L) 10.4 - 31.8 %   UIBC 528 ug/dL  Ferritin   Collection Time: 02/11/23  1:49 PM  Result Value Ref Range   Ferritin 11 11 - 307 ng/mL  Amylase   Collection Time: 02/11/23  1:49 PM  Result Value Ref Range   Amylase 92 28 - 100 U/L  Lipase, blood   Collection Time: 02/11/23  1:49 PM  Result Value Ref Range   Lipase 40 11 - 51 U/L  Vitamin B12    Collection Time: 02/11/23  1:49 PM  Result Value Ref Range   Vitamin B-12 209 180 - 914 pg/mL  Folate   Collection Time: 02/11/23  1:49 PM  Result Value Ref Range   Folate 22.8 >5.9 ng/mL  Wet prep, genital   Collection Time: 02/11/23  7:19 PM  Result Value Ref Range   Yeast Wet Prep HPF POC NONE SEEN NONE SEEN   Trich, Wet Prep NONE SEEN NONE  SEEN   Clue Cells Wet Prep HPF POC NONE SEEN NONE SEEN   WBC, Wet Prep HPF POC >=10 (A) <10   Sperm NONE SEEN   Type and screen [facility]   Collection Time: 02/11/23  8:06 PM  Result Value Ref Range   ABO/RH(D) B POS    Antibody Screen NEG    Sample Expiration      02/14/2023,2359 Performed at [facility], [address] [place], [place] [address]   Reticulocytes   Collection Time: 02/12/23  4:59 AM  Result Value Ref Range   Retic Ct Pct 2.3 0.4 - 3.1 %   RBC. 3.09 (L) 3.87 - 5.11 MIL/uL   Retic Count, Absolute 70.1 19.0 - 186.0 K/uL   Immature Retic Fract 20.0 (H) 2.3 - 15.9 %  CBC with Differential/Platelet   Collection Time: 02/12/23  5:01 AM  Result Value Ref Range   WBC 6.9 4.0 - 10.5 K/uL   RBC 3.11 (L) 3.87 - 5.11 MIL/uL   Hemoglobin 8.9 (L) 12.0 - 15.0 g/dL   HCT 60.4 (L) 54.0 - 98.1 %   MCV 86.2 80.0 - 100.0 fL   MCH 28.6 26.0 - 34.0 pg   MCHC 33.2 30.0 - 36.0 g/dL   RDW 19.1 47.8 - 29.5 %   Platelets 260 150 - 400 K/uL   nRBC 0.0 0.0 - 0.2 %   Neutrophils Relative % 64 %   Neutro Abs 4.4 1.7 - 7.7 K/uL   Lymphocytes Relative 24 %   Lymphs Abs 1.6 0.7 - 4.0 K/uL   Monocytes Relative 8 %   Monocytes Absolute 0.6 0.1 - 1.0 K/uL   Eosinophils Relative 3 %   Eosinophils Absolute 0.2 0.0 - 0.5 K/uL   Basophils Relative 0 %   Basophils Absolute 0.0 0.0 - 0.1 K/uL   Immature Granulocytes 1 %   Abs Immature Granulocytes 0.04 0.00 - 0.07 K/uL  CBC   Collection Time: 02/13/23  7:48 AM  Result Value Ref Range   WBC 4.9 4.0 - 10.5 K/uL   RBC 2.93 (L) 3.87 - 5.11 MIL/uL   Hemoglobin 8.3 (L)  12.0 - 15.0 g/dL   HCT 62.1 (L) 30.8 - 65.7 %   MCV 86.7 80.0 - 100.0 fL   MCH 28.3 26.0 - 34.0 pg   MCHC 32.7 30.0 - 36.0 g/dL   RDW 84.6 96.2 - 95.2 %   Platelets 231 150 - 400 K/uL   nRBC 0.0 0.0 - 0.2 %  Comprehensive metabolic panel   Collection Time: 02/13/23  7:48 AM  Result Value Ref Range   Sodium 135 135 - 145 mmol/L   Potassium 3.7 3.5 - 5.1 mmol/L   Chloride 106 98 - 111 mmol/L   CO2 21 (L) 22 - 32 mmol/L   Glucose, Bld 78 70 - 99 mg/dL   BUN 6 6 - 20 mg/dL   Creatinine, Ser 8.41 0.44 - 1.00 mg/dL   Calcium 8.3 (L) 8.9 - 10.3 mg/dL   Total Protein 5.8 (L) 6.5 - 8.1 g/dL   Albumin 2.6 (L) 3.5 - 5.0 g/dL   AST 14 (L) 15 - 41 U/L   ALT 10 0 - 44 U/L   Alkaline Phosphatase 32 (L) 38 - 126 U/L   Total Bilirubin 0.4 0.0 - 1.2 mg/dL   GFR, Estimated >32 >44 mL/min   Anion gap 8 5 - 15  Troponin I (High Sensitivity)   Collection Time: 02/14/23 12:57 PM  Result Value Ref Range   Troponin I (High Sensitivity) 2 <18 ng/L  Troponin I (High Sensitivity)   Collection Time: 02/14/23  4:42 PM  Result Value Ref Range   Troponin I (High Sensitivity) 3 <18 ng/L  Glucose, 1 hour gestational   Collection Time: 02/17/23 11:08 AM  Result Value Ref Range   Glucose Tolerance, 1 hour 131 mg/dL   [place] MFM OB LIMITED Result Date: 02/17/2023 ----------------------------------------------------------------------  OBSTETRICS REPORT                    (Corrected Final 02/17/2023 08:27 pm) ---------------------------------------------------------------------- Patient Info  ID #:       [id number]                          D.O.B.:  1990/03/18 (32 yrs)(F)  Name:       Carroll Sage               Visit Date: 02/17/2023 02:35 pm ---------------------------------------------------------------------- Performed By  Attending:        Braxton Feathers DO       Ref. Address:     [address]                                                             [place], [place]                                                              [address]  Performed By:     Percell Boston          Secondary Phy.:   [facility]                    RDMS                                                             Care  Referred By:      Billey Gosling                [facility] ---------------------------------------------------------------------- Orders  #  Description                           Code        Ordered By  1  [place] MFM OB LIMITED                     95284.13    Chen Holzman ----------------------------------------------------------------------  #  Order #                     Accession #                Episode #  1  [id number]                   [id number]                 [id number] ---------------------------------------------------------------------- Indications  Polyhydramnios, second trimester,              O40.2XX1  antepartum condition or complication  Encounter for antenatal screening,             Z36.9  unspecified  Abdominal pain in pregnancy                    O99.89  Hyperemesis gravidarum                         O21.0  Poor obstetric history: Previous               O09.299  preeclampsia / eclampsia/gestational HTN  History of pre-term deliveries(34 weeks IOL    O09.219  Pre E)  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 Cord Insertion:      Visualized, central  Amniotic Fluid  AFI FV:      Within normal limits                              Largest Pocket(cm)                              9.2 ---------------------------------------------------------------------- OB History  Blood Type:   B+  Gravidity:    3         Term:   1        Prem:    1  Living:       2 ---------------------------------------------------------------------- Gestational Age  LMP:           26w 1d        Date:  08/18/22                  EDD:   05/25/23  Best:          24w 5d     Det. By:  U/S C R L  (10/13/22)    EDD:   06/04/23 ---------------------------------------------------------------------- Anatomy  Cranium:               Appears normal         Stomach:                Appears normal, left  sided  Ventricles:            Appears normal         Bladder:                Appears normal  Thoracic:              Appears normal ---------------------------------------------------------------------- Cervix Uterus Adnexa  Cervix  Length:              4  cm.  Normal appearance by transabdominal scan  Uterus  No abnormality visualized.  Right Ovary  Not visualized.  Left Ovary  Within normal limits.  Cul De Sac  No free fluid seen.  Adnexa  No abnormality visualized ---------------------------------------------------------------------- Comments  Hospital Ultrasound  The patient is admitted to [facility] specialty care for abdominal  pain, likely MSK.  Sonographic findings  Single intrauterine pregnancy at 24w 5d  Fetal cardiac activity: Observed and appears normal.  Presentation: Breech.  Limited fetal anatomy appears normal except.  Amniotic fluid volume: Mild polyhydramnios. MVP: 9.2 cm.  Placenta: Posterior. There is no sonographic evidence of  placental bleeding.  Recommendations  - See consultation in Epic  This was a limited ultrasound with a remote read. If an official  MFM consult is requested for any reason please call/place an  order in Epic ----------------------------------------------------------------------                       Braxton Feathers, DO Electronically Signed Corrected Final Report  02/17/2023 08:27 pm ----------------------------------------------------------------------   MR LUMBAR SPINE WO CONTRAST Result  Date: 02/17/2023 CLINICAL DATA:  33 year old pregnant female in the 2nd trimester with lumbar radiculopathy. EXAM: MRI LUMBAR SPINE WITHOUT CONTRAST TECHNIQUE: Multiplanar, multisequence MR imaging of the lumbar spine was performed. No intravenous contrast was administered. COMPARISON:  MRI abdomen and pelvis 02/11/2023. Previous CT Abdomen and Pelvis 07/01/2018. FINDINGS: Segmentation:  Normal on the comparison CT. Alignment: Mild straightening of lumbar lordosis compared to 2020. No significant scoliosis or spondylolisthesis. Vertebrae: Normal background bone marrow signal. Maintained vertebral height. Intact visible sacrum. No marrow edema or evidence of acute osseous abnormality. Conus medullaris and cauda equina: Conus extends to the L1 level. No lower spinal cord or conus signal abnormality. Capacious spinal canal above L5. And cauda equina nerve roots at those levels appear unremarkable. Paraspinal and other soft tissues: Partially visible gravid uterus. Visualized abdominal viscera and paraspinal soft tissues are within normal limits. Disc levels: T11-T12 through L3-L4 are normal. L4-L5: Minimal disc bulging and facet hypertrophy at this level with no stenosis. L5-S1: Epidural lipomatosis at this level effaces CSF from the thecal sac (series 9, image 33). Minor disc desiccation here, no disc herniation. Mild facet hypertrophy. No discrete neural impingement. IMPRESSION: 1. Largely normal for age MRI of the lumbar spine. No significant disc degeneration. 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 2. Partially visible gravid uterus. Electronically Signed   By: Odessa Fleming M.D.   On: 02/17/2023 04:06   DG Chest 2 View Result Date: 02/14/2023 CLINICAL DATA:  Left-sided chest pain.  Second trimester pregnancy. EXAM: CHEST - 2 VIEW COMPARISON:  09/14/2020 FINDINGS: The heart size and mediastinal contours are within normal limits. Both lungs are clear. The  visualized skeletal structures are unremarkable. IMPRESSION: No active cardiopulmonary disease. Electronically Signed   By: Paulina Fusi M.D.   On: 02/14/2023 15:13   VAS [place] LOWER EXTREMITY VENOUS (DVT) Result Date: 02/13/2023  Lower Venous DVT  Study Patient Name:  ARSIE PARADOWSKI  Date of Exam:   02/13/2023 Medical Rec #: [id number]          Accession #:    [id number] Date of Birth: [birth year]          Patient Gender: F Patient Age:   33 years Exam Location:  [facility] Procedure:      VAS [place] LOWER EXTREMITY VENOUS (DVT) Referring Phys: CHARLIE PICKENS --------------------------------------------------------------------------------  Indications: Pain.  Comparison Study: No prior exam. Performing Technologist: Fernande Bras  Examination Guidelines: A complete evaluation includes B-mode imaging, spectral Doppler, color Doppler, and power Doppler as needed of all accessible portions of each vessel. Bilateral testing is considered an integral part of a complete examination. Limited examinations for reoccurring indications may be performed as noted. The reflux portion of the exam is performed with the patient in reverse Trendelenburg.  +---------+---------------+---------+-----------+----------+--------------+ RIGHT    CompressibilityPhasicitySpontaneityPropertiesThrombus Aging +---------+---------------+---------+-----------+----------+--------------+ CFV      Full           Yes      Yes                                 +---------+---------------+---------+-----------+----------+--------------+ SFJ      Full           Yes      Yes                                 +---------+---------------+---------+-----------+----------+--------------+ FV Prox  Full                                                        +---------+---------------+---------+-----------+----------+--------------+ FV Mid   Full                                                         +---------+---------------+---------+-----------+----------+--------------+ FV DistalFull                                                        +---------+---------------+---------+-----------+----------+--------------+ PFV      Full                                                        +---------+---------------+---------+-----------+----------+--------------+ POP      Full           Yes      Yes                                 +---------+---------------+---------+-----------+----------+--------------+ PTV      Full                                                        +---------+---------------+---------+-----------+----------+--------------+  PERO     Full                                                        +---------+---------------+---------+-----------+----------+--------------+ Lymph node noted in right groin measuring 2.6 x 1.3 x 2 cm with vascularity. Painful with compression.  +----+---------------+---------+-----------+----------+--------------+ LEFTCompressibilityPhasicitySpontaneityPropertiesThrombus Aging +----+---------------+---------+-----------+----------+--------------+ CFV Full           Yes      Yes                                 +----+---------------+---------+-----------+----------+--------------+ SFJ Full           Yes      Yes                                 +----+---------------+---------+-----------+----------+--------------+     Summary: RIGHT: - There is no evidence of deep vein thrombosis in the lower extremity.  - No cystic structure found in the popliteal fossa.  LEFT: - No evidence of common femoral vein obstruction.   *See table(s) above for measurements and observations. Electronically signed by Sherald Hess MD on 02/13/2023 at 5:07:28 PM.    Final    [place] MFM OB FOLLOW UP Result Date: 02/12/2023 ----------------------------------------------------------------------  OBSTETRICS REPORT                        (Signed  Final 02/12/2023 02:07 pm) ---------------------------------------------------------------------- Patient Info  ID #:       [id number]                          D.O.B.:  Jan 04, 1991 (32 yrs)(F)  Name:       Alvester Chou [facility]               Visit Date: 02/12/2023 10:06 am ---------------------------------------------------------------------- Performed By  Attending:        Lin Landsman      Ref. Address:      [address]                    MD                                                              [place], [place]                                                              [address]  Performed By:     Marcellina Millin          Secondary Phy.:    [facility]                    RDMS  Care  Referred By:      Billey Gosling                [facility] ---------------------------------------------------------------------- Orders  #  Description                           Code        Ordered By  1  [place] MFM OB FOLLOW UP                   [address]    CHARLIE PICKENS ----------------------------------------------------------------------  #  Order #                     Accession #                Episode #  1  [id number]                   [id number]                 [id number] ---------------------------------------------------------------------- Indications  Antenatal follow-up for nonvisualized fetal     Z36.2  anatomy  [redacted] weeks gestation of pregnancy                 Z3A.24  Hyperemesis gravidarum                          O21.0  Poor obstetric history: Previous                O09.299  preeclampsia / eclampsia/gestational HTN  History of pre-term deliveries(34 weeks IOL     O09.219  Pre E)  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 Cord Insertion:       Previously seen  Amniotic Fluid  AFI FV:      Within normal limits                              Largest Pocket(cm)                              6 ---------------------------------------------------------------------- Biometry  BPD:      60.9  mm     G. Age:  24w 5d         72  %    CI:        73.73   %    70 - 86  FL/HC:       18.8  %    18.7 - 20.9  HC:      225.3  mm     G. Age:  24w 4d         54  %    HC/AC:       1.09       1.05 - 1.21  AC:      207.3  mm     G. Age:  25w 2d         80  %    FL/BPD:      69.6  %    71 - 87  FL:       42.4  mm     G. Age:  23w 6d         32  %    FL/AC:       20.5  %    20 - 24  Est. FW:     722   gm     1 lb 9 oz     73  % ---------------------------------------------------------------------- OB History  Blood Type:   B+  Gravidity:    3         Term:   1        Prem:   1  Living:       2 ---------------------------------------------------------------------- Gestational Age  LMP:           25w 3d        Date:  08/18/22                  EDD:   05/25/23  U/S Today:     24w 4d                                        EDD:   05/31/23  Best:          24w 0d     Det. By:  U/S C R L  (10/13/22)    EDD:   06/04/23 ---------------------------------------------------------------------- Targeted Anatomy  Central Nervous System  Calvarium/Cranial V.:  Appears normal         Cereb./Vermis:          Appears normal  Cavum:                 Appears normal         Cisterna Magna:         Appears normal  Lateral Ventricles:    Appears normal         Midline Falx:           Appears normal  Choroid Plexus:        Appears normal  Spine  Cervical:              Appears normal         Sacral:                 Appears normal  Thoracic:              Appears normal         Shape/Curvature:         Appears normal  Lumbar:                Appears normal  Head/Neck  Lips:  Appears normal         Profile:                Appears normal  Neck:                  Appears normal         Orbits/Eyes:            Previously seen  Nuchal Fold:           Previously seen        Mandible:               Previously seen  Nasal Bone:            Present                Maxilla:                Previously seen  Thorax  4 Chamber View:        Appears normal         Interventr. Septum:     Appears normal  Cardiac Rhythm:        Normal                 Cardiac Axis:           Normal  Cardiac Situs:         Appears normal         Diaphragm:              Appears normal  Rt Outflow Tract:      Appears normal         3 Vessel View:          Previously seen  Lt Outflow Tract:      Appears normal         3 V Trachea View:       Previously seen  Aortic Arch:           Appears normal         IVC:                    Previously seen  Ductal Arch:           Appears normal         Crossing:               Appears normal  SVC:                   Previously seen  Abdomen  Ventral Wall:          Appears normal         Lt Kidney:              Appears normal  Cord Insertion:        Appears normal         Rt Kidney:              Appears normal  Situs:                 Appears normal         Bladder:                Appears normal  Stomach:               Appears normal  Extremities  Lt Humerus:            Previously seen  Lt Femur:               Previously seen  Rt Humerus:            Previously seen        Rt Femur:               Previously seen  Lt Forearm:            Previously seen        Lt Lower Leg:           Previously seen  Rt Forearm:            Previously seen        Rt Lower Leg:           Previously seen  Lt Hand:               Previously seen        Lt Foot:                Previously seen  Rt Hand:               Previously seen        Rt Foot:                Previously seen  Other  Umbilical Cord:        Normal 3-vessel         Genitalia:              Female-nml ---------------------------------------------------------------------- Cervix Uterus Adnexa  Cervix  Length:              5  cm.  Normal appearance by transabdominal scan  Uterus  No abnormality visualized.  Right Ovary  Not visualized.  Left Ovary  Within normal limits.  Adnexa  No abnormality visualized ---------------------------------------------------------------------- Impression  Follow up growth due to incomplete anatomy.  Normal interval growth with measurements consistent with  dates  Good fetal movement and amniotic fluid volume  All anatomy completed today. ---------------------------------------------------------------------- Recommendations  Follow up growth as clinically indicated. ----------------------------------------------------------------------              Lin Landsman, MD Electronically Signed Final Report   02/12/2023 02:07 pm ----------------------------------------------------------------------   MR ABDOMEN WO CONTRAST Result Date: 02/11/2023 CLINICAL DATA:  33 year old female with 23 leak gestation pregnancy. RIGHT-sided pain. EXAM: MRI ABDOMEN AND PELVIS WITHOUT CONTRAST TECHNIQUE: Multiplanar multisequence MR imaging of the abdomen and pelvis was performed. No intravenous contrast was administered. COMPARISON:  None Available. FINDINGS: COMBINED FINDINGS FOR BOTH MR ABDOMEN AND PELVIS Lower chest: Trace bilateral effusions. Hepatobiliary: No focal hepatic lesion. Normal gallbladder. No biliary duct dilatation. Common bile duct is normal. Pancreas: Pancreas is normal. No ductal dilatation. No pancreatic inflammation. Spleen: Normal spleen Adrenals/urinary tract: Adrenal glands normal. Kidneys are normal. No hydronephrosis. No hydroureter. Normal bladder Stomach/Bowel: Stomach duodenum small-bowel normal. Terminal ileum normal. Appendix not identified. There is no inflammation in the RIGHT lower quadrant to suggest acute appendicitis. Ascending,  transverse and descending colon normal. Rectum normal. Vascular/Lymphatic: Abdominal aorta is normal caliber. No periportal or retroperitoneal adenopathy. No pelvic adenopathy. Reproductive: Gravid uterus. Fetus in breech orientation. Placenta the S within the uterine fundus. Other: No free fluid. Musculoskeletal: No aggressive osseous lesion. IMPRESSION: 1. No evidence acute appendicitis. Appendix not visualized but no secondary signs of appendicitis. 2. No hydronephrosis. 3. Normal gallbladder. 4. Gravid uterus with fetus in breech orientation. Placenta in uterine fundus. Electronically Signed   By:  Genevive Bi M.D.   On: 02/11/2023 18:49   MR PELVIS WO CONTRAST Result Date: 02/11/2023 CLINICAL DATA:  33 year old female with 23 leak gestation pregnancy. RIGHT-sided pain. EXAM: MRI ABDOMEN AND PELVIS WITHOUT CONTRAST TECHNIQUE: Multiplanar multisequence MR imaging of the abdomen and pelvis was performed. No intravenous contrast was administered. COMPARISON:  None Available. FINDINGS: COMBINED FINDINGS FOR BOTH MR ABDOMEN AND PELVIS Lower chest: Trace bilateral effusions. Hepatobiliary: No focal hepatic lesion. Normal gallbladder. No biliary duct dilatation. Common bile duct is normal. Pancreas: Pancreas is normal. No ductal dilatation. No pancreatic inflammation. Spleen: Normal spleen Adrenals/urinary tract: Adrenal glands normal. Kidneys are normal. No hydronephrosis. No hydroureter. Normal bladder Stomach/Bowel: Stomach duodenum small-bowel normal. Terminal ileum normal. Appendix not identified. There is no inflammation in the RIGHT lower quadrant to suggest acute appendicitis. Ascending, transverse and descending colon normal. Rectum normal. Vascular/Lymphatic: Abdominal aorta is normal caliber. No periportal or retroperitoneal adenopathy. No pelvic adenopathy. Reproductive: Gravid uterus. Fetus in breech orientation. Placenta the S within the uterine fundus. Other: No free fluid. Musculoskeletal: No  aggressive osseous lesion. IMPRESSION: 1. No evidence acute appendicitis. Appendix not visualized but no secondary signs of appendicitis. 2. No hydronephrosis. 3. Normal gallbladder. 4. Gravid uterus with fetus in breech orientation. Placenta in uterine fundus. Electronically Signed   By: Genevive Bi M.D.   On: 02/11/2023 18:49    Future Appointments  Date Time Provider Department Center  02/18/2023  2:35 PM Osborne Oman [facility]  02/25/2023  8:20 AM [facility]  02/25/2023  9:15 AM Bernerd Limbo, CNM [facility]  03/06/2023 11:00 AM Teague Docia Chuck [facility]  03/24/2023 10:15 AM Helmus, Darrell Jewel, PT [facility] None    Discharge Condition: Stable  Discharge disposition: 01-Home or Self Care       Discharge Instructions     Activity as tolerated - No restrictions   Complete by: As directed    Ambulatory referral to Physical Therapy   Complete by: As directed    [facility] OR someone who can do myofascial release (she's [redacted] weeks pregnant)   Call MD for:   Complete by: As directed    Decreased fetal movement, contractions, and vaginal bleeding.   Call MD for:  difficulty breathing, headache or visual disturbances   Complete by: As directed    Call MD for:  persistant nausea and vomiting   Complete by: As directed    Call MD for:  redness, tenderness, or signs of infection (pain, swelling, redness, odor or green/yellow discharge around incision site)   Complete by: As directed    Call MD for:  severe uncontrolled pain   Complete by: As directed    Call MD for:  temperature >100.4   Complete by: As directed    Diet general   Complete by: As directed    May shower / Bathe   Complete by: As directed       Allergies as of 02/18/2023   No Known Allergies      Medication List     STOP taking these medications    butalbital-acetaminophen-caffeine 50-325-40 MG tablet Commonly known as: FIORICET    ondansetron 8 MG tablet Commonly known as: Zofran       TAKE these medications    acetaminophen 325 MG tablet Commonly known as: TYLENOL Take 2 tablets (650 mg total) by mouth every 4 (four) hours as needed (for pain scale < 4  OR  temperature  >/=  100.5 F). What changed:  medication strength how much to take when to take this reasons to take this   bisacodyl 10 MG suppository Commonly known as: DULCOLAX Place 1 suppository (10 mg total) rectally once for 1 dose.   Bonjesta 20-20 MG Tbcr Generic drug: Doxylamine-Pyridoxine ER Take 1 tablet by mouth at bedtime.   cyclobenzaprine 10 MG tablet Commonly known as: FLEXERIL Take 1 tablet (10 mg total) by mouth every 8 (eight) hours as needed for muscle spasms.   famotidine 20 MG tablet Commonly known as: PEPCID Take 1 tablet (20 mg total) by mouth 2 (two) times daily.   gabapentin 300 MG capsule Commonly known as: Neurontin Take 1 capsule (300 mg total) by mouth 3 (three) times daily as needed.   hydrocortisone 2.5 % rectal cream Commonly known as: ANUSOL-HC Place rectally 2 (two) times daily.   multivitamin-prenatal 27-0.8 MG Tabs tablet Take 1 tablet by mouth daily at 12 noon.   oxyCODONE 5 MG immediate release tablet Commonly known as: Oxy IR/ROXICODONE Take 1-2 tablets (5-10 mg total) by mouth every 6 (six) hours as needed for moderate pain (pain score 4-6) or severe pain (pain score 7-10).   polyethylene glycol 17 g packet Commonly known as: MIRALAX / GLYCOLAX Take 17 g by mouth every other day.   predniSONE 50 MG tablet Commonly known as: DELTASONE Take 1 tablet (50 mg total) by mouth daily with breakfast for 5 days.   promethazine 12.5 MG tablet Commonly known as: PHENERGAN Take 1 tablet (12.5 mg total) by mouth every 6 (six) hours as needed for nausea or vomiting.   senna-docusate 8.6-50 MG tablet Commonly known as: Senokot-S Take 2 tablets by mouth daily.   simethicone 80 MG chewable  tablet Commonly known as: MYLICON Chew 1 tablet (80 mg total) by mouth 4 (four) times daily as needed for flatulence.   triamcinolone 0.025 % ointment Commonly known as: KENALOG Apply 1 Application topically 2 (two) times daily. Do not use for longer than 7 days               Durable Medical Equipment  (From admission, onward)           Start     Ordered   02/17/23 1408  For home use only DME Walker rolling  Once       Question Answer Comment  Walker: With 5 Inch Wheels   Patient needs a walker to treat with the following condition Leg pain, anterior, right      02/17/23 1409            Follow-up Information     [facility] Follow up in 1 week(s).   Specialty: Obstetrics and Gynecology Why: with Harvin Hazel - Message sent to her to reschedule so you should see it change in your MyChart Contact information: [address] [place] [address] [phone]                Total discharge time: 30 minutes   Signed: Milas Hock M.D. 02/18/2023, 8:59 AM

## 2023-02-17 NOTE — Progress Notes (Addendum)
FACULTY PRACTICE ANTEPARTUM PROGRESS NOTE  Heather Malone is a 33 y.o. [id number] at [redacted]w[redacted]d who is admitted for abdominal pain.  Estimated Date of Delivery: 06/04/23 Fetal presentation is unsure.  Length of Stay:  5 Days. Admitted 02/11/2023  Subjective: She Reports ongoing inguinal and periumbilical pain that does radiated into her right leg. .  Chart reviewed in detail. Still no bowel movement. Does report hemorrhoids.   Patient reports normal fetal movement.  She denies uterine contractions, denies bleeding and leaking of fluid per vagina.  Vitals:  Blood pressure 105/64, pulse 86, temperature 98.3 F (36.8 C), temperature source Oral, resp. rate 18, last menstrual period 08/18/2022, SpO2 99%. Physical Examination: CONSTITUTIONAL: Well-developed, well-nourished female in no acute distress. Eating breakfast HENT:  Normocephalic, atraumatic, External right and left ear normal. Oropharynx is clear and moist EYES: Conjunctivae and EOM are normal. Pupils are equal, round, and reactive to light. No scleral icterus.  NECK: Normal range of motion, supple, no masses. SKIN: Skin is warm and dry. No rash noted. Not diaphoretic. No erythema. No pallor. NEUROLGIC: Alert and oriented to person, place, and time. Normal reflexes, muscle tone coordination. No cranial nerve deficit noted. PSYCHIATRIC: Normal mood and affect. Normal behavior. Normal judgment and thought content. CARDIOVASCULAR: deferred RESPIRATORY:deferred MUSCULOSKELETAL: Normal range of motion. No edema and no tenderness. ABDOMEN: deferred CERVIX: deferred  Fetal monitoring:  FHR: 155 bpm, appropriate for gestational age.  Toco: Quiet   MRI lumbar spine report reviewed 1. Largely normal for age MRI of the lumbar spine. No significant disc degeneration. 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 2. Partially visible gravid uterus.    I have reviewed the patient's current  medications.  ASSESSMENT: Principal Problem:   Abdominal pain affecting pregnancy Active Problems:   Anemia in pregnancy   Abdominal pain in pregnancy, second trimester   PLAN: Abdominal Pain - Confirmed with patient the normal MRI from her spine. Reviewed labs and tests that have been done to this point all of which have thankfully been normal. Reviewed at this time, we cannot specifically identify the cause for her pain, but we can work to manage it with medications. At this time, we have ruled out life-threatening or dangerous conditions for her and baby.  - Neurology has signed off and the patient has also been seen by MFM. She is also s/p gen surg consult.  - Reviewed current plan will be to discontinue the indocin (while it does help her pain, this is not safe ongoing in pregnancy). We will give her a steroid injection following her GTT. She will then start a steroid taper in the event the pain is inflammatory (suspicious that it is given that it responds well to indocin).  - Reviewed risks of chronic opioid use to her and baby is possible addiction. Reviewed for baby this would typically mean a stay in the NICU after delivery. For these reasons our goal is to try and get her off the medications. Reviewed the risks of steroid use in pregnancy, primarily at this GA would be macrosomia.  - She is s/p PT but will set up outpatient PT referral.  - Will take her off work to at least Monday, if not indefinitely as this aggravates her pain.   Constipation - Will do enema to see if it helps along with continued bowel regimen - Reviewed main side effect of oxycodone is constipation.   Fetal well being - NST appropriate for gestational age  34.  Prenatal care - Check 1 hr GTT before steroids since almost 25 weeks.    Continue routine antenatal care but anticipate D/C later today or tomorrow.    Milas Hock, MD [facility] Faculty Attending, [facility] 02/17/2023 10:18 AM

## 2023-02-17 NOTE — Plan of Care (Signed)
MRI lumbar spine report reviewed 1. Largely normal for age MRI of the lumbar spine. No significant disc degeneration. 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 2. Partially visible gravid uterus.  Neurology will sign off at this time, discussed with primary team via secure chat.   Brooke Dare MD-PhD [facility] [phone] Available 7 AM to 7 PM, outside these hours please contact Neurologist on call listed on AMION

## 2023-02-18 ENCOUNTER — Encounter: Payer: Self-pay | Admitting: Certified Nurse Midwife

## 2023-02-18 ENCOUNTER — Encounter: Payer: Medicaid Other | Admitting: Certified Nurse Midwife

## 2023-02-18 ENCOUNTER — Other Ambulatory Visit (HOSPITAL_COMMUNITY): Payer: Self-pay

## 2023-02-18 MED ORDER — SENNOSIDES-DOCUSATE SODIUM 8.6-50 MG PO TABS
2.0000 | ORAL_TABLET | Freq: Every day | ORAL | 0 refills | Status: DC
  Filled 2023-02-18: qty 60, 30d supply, fill #0

## 2023-02-18 MED ORDER — BISACODYL 10 MG RE SUPP
10.0000 mg | Freq: Once | RECTAL | 0 refills | Status: AC
  Filled 2023-02-18: qty 12, 12d supply, fill #0

## 2023-02-18 MED ORDER — GABAPENTIN 300 MG PO CAPS
300.0000 mg | ORAL_CAPSULE | Freq: Three times a day (TID) | ORAL | 1 refills | Status: DC | PRN
  Filled 2023-02-18: qty 60, 20d supply, fill #0

## 2023-02-18 MED ORDER — OXYCODONE HCL 5 MG PO TABS
5.0000 mg | ORAL_TABLET | Freq: Four times a day (QID) | ORAL | 0 refills | Status: DC | PRN
  Filled 2023-02-18: qty 30, 4d supply, fill #0

## 2023-02-18 MED ORDER — HYDROCORTISONE (PERIANAL) 2.5 % EX CREA
TOPICAL_CREAM | Freq: Two times a day (BID) | CUTANEOUS | 2 refills | Status: DC
  Filled 2023-02-18: qty 30, 14d supply, fill #0

## 2023-02-18 MED ORDER — METHYLPREDNISOLONE SODIUM SUCC 125 MG IJ SOLR
125.0000 mg | Freq: Once | INTRAMUSCULAR | Status: AC
  Administered 2023-02-18: 125 mg via INTRAVENOUS
  Filled 2023-02-18: qty 2

## 2023-02-18 MED ORDER — PREDNISONE 50 MG PO TABS
50.0000 mg | ORAL_TABLET | Freq: Every day | ORAL | 0 refills | Status: AC
Start: 2023-02-18 — End: 2023-02-23
  Filled 2023-02-18: qty 5, 5d supply, fill #0

## 2023-02-18 MED ORDER — SIMETHICONE 80 MG PO CHEW
80.0000 mg | CHEWABLE_TABLET | Freq: Four times a day (QID) | ORAL | 0 refills | Status: DC | PRN
  Filled 2023-02-18: qty 30, 8d supply, fill #0

## 2023-02-18 MED ORDER — FAMOTIDINE 20 MG PO TABS
20.0000 mg | ORAL_TABLET | Freq: Two times a day (BID) | ORAL | 0 refills | Status: DC
  Filled 2023-02-18: qty 60, 30d supply, fill #0

## 2023-02-18 MED ORDER — CYCLOBENZAPRINE HCL 10 MG PO TABS
10.0000 mg | ORAL_TABLET | Freq: Three times a day (TID) | ORAL | 1 refills | Status: DC | PRN
  Filled 2023-02-18: qty 60, 20d supply, fill #0

## 2023-02-18 MED ORDER — ACETAMINOPHEN 325 MG PO TABS
650.0000 mg | ORAL_TABLET | ORAL | 0 refills | Status: DC | PRN
  Filled 2023-02-18: qty 60, 5d supply, fill #0

## 2023-02-18 MED ORDER — POLYETHYLENE GLYCOL 3350 17 GM/SCOOP PO POWD
17.0000 g | ORAL | 0 refills | Status: DC
  Filled 2023-02-18: qty 238, 28d supply, fill #0

## 2023-02-18 NOTE — Progress Notes (Signed)
Physical Therapy Treatment Patient Details Name: Heather Malone MRN: [id number] DOB: September 04, 1990 Today's Date: 02/18/2023   History of Present Illness Patient is a 33 y/o female G3P1102 [redacted]w[redacted]d admitted 02/11/23 with R groin/abdominal radiating pain, workup negative, possible constipation or musculoskeletal pain.  PMH positive for migraines, pre-eclampsia, placental abruption in third trimester, and anemia.    PT Comments  Patient seen prior to d/c at MD request.  She denied need for further ambulation/stairs, though requested assistance for walker adjustment.  Reviewed pain management techniques taught including nerve gliding, stretching, lymphatic massage, wearing belly support, spinal precautions, and use of heat and ice.  Patient without further concerns and has outpatient PT appointment later this week.   If plan is discharge home, recommend the following: Assistance with cooking/housework;Assist for transportation;Help with stairs or ramp for entrance   Can travel by private vehicle        Equipment Recommendations  Rolling walker (2 wheels)    Recommendations for Other Services       Precautions / Restrictions Precautions Precautions: Fall     Mobility  Bed Mobility Overal bed mobility: Modified Independent             General bed mobility comments: cues for spinal technique    Transfers   Equipment used: Rolling walker (2 wheels)   Sit to Stand: Supervision           General transfer comment: up to stand for walker fitting with walker delivered to room, adjusted for height    Ambulation/Gait Ambulation/Gait assistance: Modified independent (Device/Increase time) Gait Distance (Feet): 2 Feet Assistive device: Rolling walker (2 wheels) Gait Pattern/deviations: Step-to pattern       General Gait Details: couple steps for ensuring walker fitting   Stairs             Wheelchair Mobility     Tilt Bed    Modified Rankin (Stroke Patients  Only)       Balance Overall balance assessment: Needs assistance   Sitting balance-Leahy Scale: Good       Standing balance-Leahy Scale: Fair                              Cognition Arousal: Alert Behavior During Therapy: WFL for tasks assessed/performed Overall Cognitive Status: Within Functional Limits for tasks assessed                                          Exercises      General Comments General comments (skin integrity, edema, etc.): Patient reports was able to get in with outpatient PT she has been to before later this week.  She was encouraged to continue pain management techniques taught including ice/heat, using belly support (though keeping out of groin), lymphatic massage, nerve gliding and piriformis stretching.      Pertinent Vitals/Pain Pain Assessment Faces Pain Scale: Hurts even more Pain Location: R side abdomen to leg Pain Descriptors / Indicators: Tender, Sharp Pain Intervention(s): Monitored during session, Premedicated before session    Home Living                          Prior Function            PT Goals (current goals can now be found in the care plan section) Progress  towards PT goals: Progressing toward goals    Frequency    Min 1X/week      PT Plan      Co-evaluation              AM-PAC PT "6 Clicks" Mobility   Outcome Measure  Help needed turning from your back to your side while in a flat bed without using bedrails?: None Help needed moving from lying on your back to sitting on the side of a flat bed without using bedrails?: None Help needed moving to and from a bed to a chair (including a wheelchair)?: None Help needed standing up from a chair using your arms (e.g., wheelchair or bedside chair)?: None Help needed to walk in hospital room?: None Help needed climbing 3-5 steps with a railing? : A Little 6 Click Score: 23    End of Session   Activity Tolerance: Patient  tolerated treatment well Patient left: in bed;with call bell/phone within reach   PT Visit Diagnosis: Difficulty in walking, not elsewhere classified (R26.2);Pain Pain - Right/Left: Right Pain - part of body: Leg     Time: 1026-1039 PT Time Calculation (min) (ACUTE ONLY): 13 min  Charges:    $Therapeutic Activity: 8-22 mins PT General Charges $$ ACUTE PT VISIT: 1 Visit                     Sheran Lawless, PT Acute Rehabilitation Services Office:[phone] 02/18/2023    [name] 02/18/2023, 5:15 PM

## 2023-02-20 ENCOUNTER — Ambulatory Visit: Payer: Medicaid Other | Attending: Family Medicine | Admitting: Physical Therapy

## 2023-02-20 ENCOUNTER — Encounter: Payer: Self-pay | Admitting: Physical Therapy

## 2023-02-20 DIAGNOSIS — M6281 Muscle weakness (generalized): Secondary | ICD-10-CM | POA: Diagnosis present

## 2023-02-20 NOTE — Therapy (Signed)
OUTPATIENT PHYSICAL THERAPY FEMALE PELVIC TREATMENT   Patient Name: Heather Malone MRN: [id number] DOB:01-05-1991, 33 y.o., female Today's Date: 02/20/2023  END OF SESSION:  PT End of Session - 02/20/23 1134     Visit Number 3    Authorization Type [employer]    Authorization Time Period no auth required    PT Start Time 1015    PT Stop Time 1115    PT Time Calculation (min) 60 min    Activity Tolerance Patient tolerated treatment well    Behavior During Therapy WFL for tasks assessed/performed               Past Medical History:  Diagnosis Date   Anemia    Anxiety    Asthma    albuterol prn   Headache    migraines   Hx of trichomoniasis    Hx of varicella    Neck injury, subsequent encounter 07/26/2016   Placental abruption in third trimester 09/04/2014   Pre-eclampsia    Pregnancy induced hypertension    Subchorionic hemorrhage in third trimester 07/26/2014   Velamentous insertion of umbilical cord    Past Surgical History:  Procedure Laterality Date   TONSILLECTOMY     Patient Active Problem List   Diagnosis Date Noted   Abdominal pain in pregnancy, second trimester 02/12/2023   Abdominal pain affecting pregnancy 02/11/2023   Hyperemesis 12/10/2022   Supervision of high-risk pregnancy 10/29/2022   History of severe pre-eclampsia 10/29/2022   Elevated blood pressure complicating pregnancy in first trimester, antepartum 10/01/2022   Anemia in pregnancy 08/05/2020   Alpha thalassemia silent carrier 08/02/2020   Chronic headaches 04/12/2020    PCP: Patient, No Pcp Per PCP - General  REFERRING PROVIDER: Reva Bores, MD Ref Provider  REFERRING DIAG: R10.30 (ICD-10-CM) - Lower abdominal pain    THERAPY DIAG:  Muscle weakness (generalized)  Rationale for Evaluation and Treatment: Rehabilitation  ONSET DATE: 10/2022  SUBJECTIVE:                                                                                                                                                                                            SUBJECTIVE STATEMENT: Pt coming back to PT after 5 weeks Has been to ED or hospitalized 3 times since last visit Pt is [redacted] weeks pregnant. Pt reports that she was in the hospital for a week d/t abdominal pain.  Still has right leg pain, amb with a walker. Yesterday was 10/10, today was 7/10. She is not able to lift her leg. She is supposed to be on bed rest. On short term disability. Wondering if  she can come to PT. Has to sleep on the couch at home. Boyfriend has been helpful. Wants to get induced at 35-37 weeks. Has been checking her BP at home, if has been good.  She is low on iron. Has bad abdominal pain, some umbilical hernia Has been sedentary.  They told her that baby is maybe sitting low on the nerves Has sciatica with her previous pregnancy     Fluid intake: Yes: able to keep something down    PAIN:  Are you having pain? Yes NPRS scale: 7-8/10 Pain location:  abdomen- like lightning bolts, low back as well  Pain type: burning  Pain description: intermittent and constant   Aggravating factors: sitting too long, walking too much Relieving factors: ice packs on backs, bending down  PRECAUTIONS: None  RED FLAGS: None   WEIGHT BEARING RESTRICTIONS: No  FALLS:  Has patient fallen in last 6 months? No  LIVING ENVIRONMENT: Lives with: lives with their family Lives in: House/apartment Stairs: No Has following equipment at home: None  OCCUPATION: desk job, benefits specialist  PLOF: Independent  PATIENT GOALS: relieve the pain  PERTINENT HISTORY:  Nothing relevant Sexual abuse: No  BOWEL MOVEMENT:n/a   URINATION: no issues  INTERCOURSE: no issues  PREGNANCY: Vaginal deliveries 2 Tearing No C-section deliveries 0 Currently pregnant Yes: 19 weeks  PROLAPSE: None   OBJECTIVE:  Note: Objective measures were completed at Evaluation unless otherwise  noted.     COGNITION: Overall cognitive status: Within functional limits for tasks assessed     SENSATION: Light touch: Appears intact Proprioception: Appears intact  MUSCLE LENGTH: Hamstrings: Right 80 deg; Left 80 deg   LUMBAR SPECIAL TESTS:  Stork standing: Positive for weakness bilat    GAIT: Comments: antalgic, wide stance  POSTURE: rounded shoulders, forward head, increased lumbar lordosis, and anterior pelvic tilt  PELVIC ALIGNMENT: seems even  LUMBARAROM/PROM:  A/PROM A/PROM  eval  Flexion 50% difficult  Extension 50% difficult   LOWER EXTREMITY ROM: some limitations throughout d/t stiffness and pain  LOWER EXTREMITY MMT: at least 4/5 grossly overall    PALPATION:   General  abdomen- low tone                External Perineal Exam to be assessed as needed                             Internal Pelvic Floor to be assessed as needed  Patient confirms identification and approves PT to assess internal pelvic floor and treatment No  PELVIC MMT:   MMT eval  Vaginal   Internal Anal Sphincter   External Anal Sphincter   Puborectalis   Diastasis Recti   (Blank rows = not tested)        TONE: Low tone abdomen High tone and trigger points lumbar paraspinals Pelvic girdle external pressure relieved pain ( serola belt trial, manual pressure)  PROLAPSE: To be assessed if needed  TODAY'S TREATMENT:  DATE: 02/20/23   Manual- STM lumbar paraspinals, QL  and right glutes  Therapeutic activities- trial of kinesiotaping- 4 strips herringbone pattern on abdomen Trial of sheet instead of belt- not tol well on abdomen                      HOME EXERCISE PROGRAM: [id number]  ASSESSMENT:  CLINICAL IMPRESSION: Pt with a lot of weakness and pain today, high irritability, Difficulty with transfers and ambulation. Unable to put weight down  right foot. Did well with trial of kinesiotape and massage. Pt would benefit form cont PT, will check with her OBGYN and let us know. Aquatherapy at [facility] might be beneficial as well as land and manual here.    OBJECTIVE IMPAIRMENTS: decreased activity tolerance, decreased knowledge of condition, difficulty walking, decreased strength, increased muscle spasms, impaired flexibility, impaired tone, improper body mechanics, and pain.   ACTIVITY LIMITATIONS: carrying, lifting, bending, standing, squatting, sleeping, stairs, transfers, bed mobility, locomotion level, and caring for others  PARTICIPATION LIMITATIONS: meal prep, cleaning, laundry, shopping, community activity, occupation, and yard work  PERSONAL FACTORS: Fitness and Time since onset of injury/illness/exacerbation are also affecting patient's functional outcome.   REHAB POTENTIAL: Good  CLINICAL DECISION MAKING: Stable/uncomplicated  EVALUATION COMPLEXITY: Low   GOALS: Goals reviewed with patient? Yes  SHORT TERM GOALS: Target date: 02/06/2023    Pt will be I with her HEP and demonstrate her exercises correctly Baseline: Goal status: INITIAL  2.  Pt will report max 4/10 pain with walking for 3o mins Baseline:  Goal status: INITIAL  3.  Pt will be able to lift her 2 yo child with good pressure management at least 50% of the time Baseline:  Goal status: INITIAL   LONG TERM GOALS: Target date: 04/03/2023    Pt will report max 2/10 low back and abdominal pain with 1 hour of walking  Baseline:  Goal status: INITIAL  2.  Pt will be I with advanced HEP and dem all exercises correctly Baseline:  Goal status: INITIAL  3.  Pt will be able to sleep at least 5 consecutive hours at night without increased pain Baseline:  Goal status: INITIAL   PLAN:  PT FREQUENCY: 1-2x/week  PT DURATION: 12 weeks  PLANNED INTERVENTIONS: 97110-Therapeutic exercises, 97530- Therapeutic activity, 97112- Neuromuscular  re-education, 97535- Self Care, 54098- Manual therapy, [id number]- Electrical stimulation (manual), Patient/Family education, Taping, Dry Needling, Joint mobilization, Joint manipulation, Spinal manipulation, Spinal mobilization, Moist heat, and Biofeedback  PLAN FOR NEXT SESSION: continue exercises, teach pt kinesio taping for abdomen  Noell Shular, PT 02/20/23 11:38 AM

## 2023-02-22 NOTE — Progress Notes (Signed)
   PRENATAL VISIT NOTE  Subjective:  Heather Malone is a 33 y.o. [id number] at [redacted]w[redacted]d being seen today for ongoing prenatal care.  She is currently monitored for the following issues for this high-risk pregnancy and has Chronic headaches; Alpha thalassemia silent carrier; Anemia in pregnancy; Elevated blood pressure complicating pregnancy in first trimester, antepartum; Supervision of high-risk pregnancy; History of severe pre-eclampsia; Hyperemesis; Abdominal pain affecting pregnancy; and Abdominal pain in pregnancy, second trimester on their problem list.  Patient reports  recovering from a stomach bug, abdominal pain is down to soreness, but she is still using a [name] for assistance. PT requested she get a referral for aquatic therapy. Also desires to restart her nausea infusions .  Contractions: Not present. Vag. Bleeding: None.  Movement: Present. Denies leaking of fluid.   The following portions of the patient's history were reviewed and updated as appropriate: allergies, current medications, past family history, past medical history, past social history, past surgical history and problem list.   Objective:   Vitals:   02/25/23 0859  BP: 126/80  Pulse: 88  Weight: 174 lb 8 oz (79.2 kg)   Fetal Status: Fetal Heart Rate (bpm): 140 Fundal Height: 25 cm Movement: Present     General:  Alert, oriented and cooperative. Patient is in no acute distress.  Skin: Skin is warm and dry. No rash noted.   Cardiovascular: Normal heart rate noted  Respiratory: Normal respiratory effort, no problems with respiration noted  Abdomen: Soft, gravid, appropriate for gestational age.  Pain/Pressure: Present     Pelvic: Cervical exam deferred        Extremities: Normal range of motion.  Edema: None  Mental Status: Normal mood and affect. Normal behavior. Normal judgment and thought content.   Assessment and Plan:  Pregnancy: G3P1102 at [redacted]w[redacted]d 1. Supervision of high risk pregnancy in second trimester  (Primary) - Doing much better, feeling plenty of fetal movement  2. [redacted] weeks gestation of pregnancy - Routine OB care including 2hr GTT today  3. Abdominal pain affecting pregnancy - Ambulatory referral to Physical Therapy for aquatics  4. Hyperemesis - Will restart infusions  5. Anemia during pregnancy in third trimester - Order placed for Venofer infusions since last Hgb=8.3   Preterm labor symptoms and general obstetric precautions including but not limited to vaginal bleeding, contractions, leaking of fluid and fetal movement were reviewed in detail with the patient. Please refer to After Visit Summary for other counseling recommendations.   Return in about 2 weeks (around 03/11/2023) for IN-PERSON, HOB.  Future Appointments  Date Time Provider Department Center  03/06/2023 11:00 AM Teague Docia Chuck [facility]  03/11/2023  9:55 AM Bernerd Limbo, CNM [facility]  03/24/2023 10:15 AM [place], [place], PT [facility] None    Bernerd Limbo, CNM

## 2023-02-23 ENCOUNTER — Inpatient Hospital Stay (HOSPITAL_COMMUNITY)
Admission: AD | Admit: 2023-02-23 | Discharge: 2023-02-23 | Disposition: A | Discharge status: Home / Self Care | Payer: Medicaid Other | Attending: Obstetrics and Gynecology | Admitting: Obstetrics and Gynecology

## 2023-02-23 ENCOUNTER — Encounter (HOSPITAL_COMMUNITY): Payer: Self-pay | Admitting: Obstetrics and Gynecology

## 2023-02-23 DIAGNOSIS — O09292 Supervision of pregnancy with other poor reproductive or obstetric history, second trimester: Secondary | ICD-10-CM | POA: Diagnosis not present

## 2023-02-23 DIAGNOSIS — O98512 Other viral diseases complicating pregnancy, second trimester: Secondary | ICD-10-CM | POA: Diagnosis not present

## 2023-02-23 DIAGNOSIS — R197 Diarrhea, unspecified: Secondary | ICD-10-CM | POA: Insufficient documentation

## 2023-02-23 DIAGNOSIS — A084 Viral intestinal infection, unspecified: Secondary | ICD-10-CM

## 2023-02-23 DIAGNOSIS — O99891 Other specified diseases and conditions complicating pregnancy: Secondary | ICD-10-CM | POA: Insufficient documentation

## 2023-02-23 DIAGNOSIS — O26892 Other specified pregnancy related conditions, second trimester: Secondary | ICD-10-CM | POA: Insufficient documentation

## 2023-02-23 DIAGNOSIS — O212 Late vomiting of pregnancy: Secondary | ICD-10-CM | POA: Diagnosis not present

## 2023-02-23 DIAGNOSIS — O99512 Diseases of the respiratory system complicating pregnancy, second trimester: Secondary | ICD-10-CM | POA: Insufficient documentation

## 2023-02-23 DIAGNOSIS — E860 Dehydration: Secondary | ICD-10-CM | POA: Diagnosis not present

## 2023-02-23 DIAGNOSIS — Z3A25 25 weeks gestation of pregnancy: Secondary | ICD-10-CM | POA: Insufficient documentation

## 2023-02-23 DIAGNOSIS — R112 Nausea with vomiting, unspecified: Secondary | ICD-10-CM | POA: Diagnosis not present

## 2023-02-23 LAB — COMPREHENSIVE METABOLIC PANEL
ALT: 18 U/L (ref 0–44)
AST: 18 U/L (ref 15–41)
Albumin: 2.7 g/dL — ABNORMAL LOW (ref 3.5–5.0)
Alkaline Phosphatase: 35 U/L — ABNORMAL LOW (ref 38–126)
Anion gap: 11 (ref 5–15)
BUN: 5 mg/dL — ABNORMAL LOW (ref 6–20)
CO2: 21 mmol/L — ABNORMAL LOW (ref 22–32)
Calcium: 8.2 mg/dL — ABNORMAL LOW (ref 8.9–10.3)
Chloride: 104 mmol/L (ref 98–111)
Creatinine, Ser: 0.4 mg/dL — ABNORMAL LOW (ref 0.44–1.00)
GFR, Estimated: >60 mL/min (ref >60)
Glucose, Bld: 73 mg/dL (ref 70–99)
Potassium: 3.7 mmol/L (ref 3.5–5.1)
Sodium: 136 mmol/L (ref 135–145)
Total Bilirubin: 0.6 mg/dL (ref 0.0–1.2)
Total Protein: 6 g/dL — ABNORMAL LOW (ref 6.5–8.1)

## 2023-02-23 MED ORDER — PROCHLORPERAZINE EDISYLATE 10 MG/2ML IJ SOLN
10.0000 mg | Freq: Once | INTRAMUSCULAR | Status: AC
  Administered 2023-02-23: 10 mg via INTRAVENOUS
  Filled 2023-02-23: qty 2

## 2023-02-23 MED ORDER — LACTATED RINGERS IV BOLUS
1000.0000 mL | Freq: Once | INTRAVENOUS | Status: AC
  Administered 2023-02-23: 1000 mL via INTRAVENOUS

## 2023-02-23 MED ORDER — ONDANSETRON HCL 4 MG/2ML IJ SOLN
4.0000 mg | Freq: Once | INTRAMUSCULAR | Status: AC
  Administered 2023-02-23: 4 mg via INTRAVENOUS
  Filled 2023-02-23: qty 2

## 2023-02-23 MED ORDER — HALOPERIDOL LACTATE 5 MG/ML IJ SOLN
0.5000 mg | Freq: Four times a day (QID) | INTRAMUSCULAR | Status: DC | PRN
  Administered 2023-02-23: 0.5 mg via INTRAVENOUS
  Filled 2023-02-23: qty 1

## 2023-02-23 NOTE — MAU Note (Signed)
..  Heather Malone is a 33 y.o. at [redacted]w[redacted]d here in MAU reporting: vomiting (too many times to count), constant diarrhea, chills since Saturday evening.  Constant headache the entire pregnancy.  abdominal pain that is shooting down her right leg, reports she is still feeling the same pain.  Has not been able to keep any medications down either.  +FM Denies vaginal bleeding or leaking of fluid.    Pain score: headache 8 /10, leg 8/10 Vitals:   02/23/23 1919  BP: 107/67  Pulse: 87  Resp: 17  Temp: 97.9 F (36.6 C)  SpO2: 100%     FHT:154 Lab orders placed from triage:  UA

## 2023-02-23 NOTE — MAU Provider Note (Incomplete Revision)
History     [id number]  Arrival date and time: 02/23/23 1825    Chief Complaint  Patient presents with   Emesis   Diarrhea   Headache     HPI Heather Malone is a 33 y.o. at [redacted]w[redacted]d with PMHx notable for anemia, chronic headaches, history of severe PreE, recent admission this pregnancy for unexplained abdominal pain and intractable n/v, who presents for n/v/d.   Review of discharge summary from last admission: admitted 02/11/23 - 02/18/23 for abdominal pain of unclear etiology, had multiple imaging procedures and several consults, no clear etiology. Started on pain meds, PT regimen. Also had significant n/v, started on steroids.    Patient reports feeling unwell for about the past three days Had been doing OK since going home then three nights ago began to have nausea, vomiting, and diarrhea Has been having trouble taking her nausea meds Has been able to drink some small amounts but overall not really eating or drinking No new or unusual foods Denies any sick contacts at home   --/--/B POS (01/15 2006)  OB History     Gravida  3   Para  2   Term  1   Preterm  1   AB  0   Living  2      SAB  0   IAB  0   Ectopic  0   Multiple  0   Live Births  2           Past Medical History:  Diagnosis Date   Anemia    Anxiety    Asthma    albuterol prn   Headache    migraines   Hx of trichomoniasis    Hx of varicella    Neck injury, subsequent encounter 07/26/2016   Placental abruption in third trimester 09/04/2014   Pre-eclampsia    Pregnancy induced hypertension    Subchorionic hemorrhage in third trimester 07/26/2014   Velamentous insertion of umbilical cord     Past Surgical History:  Procedure Laterality Date   TONSILLECTOMY      Family History  Problem Relation Age of Onset   Hypertension Mother    Hypertension Maternal Grandmother    Heart attack Maternal Grandmother    Hypertension Maternal Grandfather    Cancer Maternal Grandfather     Diabetes Paternal Grandmother     Social History   Socioeconomic History   Marital status: Single    Spouse name: Not on file   Number of children: Not on file   Years of education: Not on file   Highest education level: Not on file  Occupational History   Not on file  Tobacco Use   Smoking status: Former    Types: Cigars   Smokeless tobacco: Never  Vaping Use   Vaping status: Never Used  Substance and Sexual Activity   Alcohol use: Not Currently    Comment: weekends   Drug use: Not Currently    Types: Marijuana    Comment: none with + UPT   Sexual activity: Yes    Birth control/protection: None  Other Topics Concern   Not on file  Social History Narrative   Not on file   Social Drivers of Health   Financial Resource Strain: Not on file  Food Insecurity: No Food Insecurity (02/13/2023)   Hunger Vital Sign    Worried About Running Out of Food in the Last Year: Never true    Ran Out of Food in the Last Year:  Never true  Transportation Needs: No Transportation Needs (02/13/2023)   PRAPARE - Administrator, Civil Service (Medical): No    Lack of Transportation (Non-Medical): No  Physical Activity: Not on file  Stress: Not on file  Social Connections: Not on file  Intimate Partner Violence: Not At Risk (02/13/2023)   Humiliation, Afraid, Rape, and Kick questionnaire    Fear of Current or Ex-Partner: No    Emotionally Abused: No    Physically Abused: No    Sexually Abused: No    No Known Allergies  No current facility-administered medications on file prior to encounter.   Current Outpatient Medications on File Prior to Encounter  Medication Sig Dispense Refill   acetaminophen (TYLENOL) 325 MG tablet Take 2 tablets (650 mg total) by mouth every 4 (four) hours as needed (for pain scale < 4  OR  temperature  >/=  100.5 F). 60 tablet 0   cyclobenzaprine (FLEXERIL) 10 MG tablet Take 1 tablet (10 mg total) by mouth every 8 (eight) hours as needed for muscle  spasms. 60 tablet 1   Doxylamine-Pyridoxine ER (BONJESTA) 20-20 MG TBCR Take 1 tablet by mouth at bedtime. 60 tablet 3   famotidine (PEPCID) 20 MG tablet Take 1 tablet (20 mg total) by mouth 2 (two) times daily. 60 tablet 0   gabapentin (NEURONTIN) 300 MG capsule Take 1 capsule (300 mg total) by mouth 3 (three) times daily as needed. 60 capsule 1   hydrocortisone (ANUSOL-HC) 2.5 % rectal cream Place rectally 2 (two) times daily. 30 g 2   oxyCODONE (OXY IR/ROXICODONE) 5 MG immediate release tablet Take 1-2 tablets (5-10 mg total) by mouth every 6 (six) hours as needed for moderate pain (pain score 4-6) or severe pain (pain score 7-10). 30 tablet 0   polyethylene glycol powder (GLYCOLAX/MIRALAX) 17 GM/SCOOP powder Take 1 capful (17 g) with water by mouth every other day. 238 g 0   predniSONE (DELTASONE) 50 MG tablet Take 1 tablet (50 mg total) by mouth daily with breakfast for 5 days. 5 tablet 0   Prenatal Vit-Fe Fumarate-FA (MULTIVITAMIN-PRENATAL) 27-0.8 MG TABS tablet Take 1 tablet by mouth daily at 12 noon. 30 tablet 0   promethazine (PHENERGAN) 12.5 MG tablet Take 1 tablet (12.5 mg total) by mouth every 6 (six) hours as needed for nausea or vomiting. 30 tablet 0   senna-docusate (SENOKOT-S) 8.6-50 MG tablet Take 2 tablets by mouth daily. 60 tablet 0   simethicone (MYLICON) 80 MG chewable tablet Chew 1 tablet (80 mg total) by mouth 4 (four) times daily as needed for flatulence. 30 tablet 0   triamcinolone (KENALOG) 0.025 % ointment Apply 1 Application topically 2 (two) times daily. Do not use for longer than 7 days 30 g 0     ROS Pertinent positives and negative per HPI, all others reviewed and negative  Physical Exam   BP 107/67 (BP Location: Right Arm)   Pulse 87   Temp 97.9 F (36.6 C) (Oral)   Resp 17   Ht 5\' 2"  (1.575 m)   Wt 78.4 kg   LMP 08/18/2022 (Exact Date)   SpO2 100%   BMI 31.61 kg/m   Patient Vitals for the past 24 hrs:  BP Temp Temp src Pulse Resp SpO2 Height Weight   02/23/23 1919 107/67 97.9 F (36.6 C) Oral 87 17 100 % 5\' 2"  (1.575 m) 78.4 kg    Physical Exam Vitals reviewed.  Constitutional:      General: She is  not in acute distress.    Appearance: She is well-developed. She is not diaphoretic.  Eyes:     General: No scleral icterus. Pulmonary:     Effort: Pulmonary effort is normal. No respiratory distress.  Abdominal:     General: There is no distension.     Palpations: Abdomen is soft.     Tenderness: There is no abdominal tenderness. There is no guarding or rebound.  Skin:    General: Skin is warm and dry.  Neurological:     Mental Status: She is alert.     Coordination: Coordination normal.      Cervical Exam    Bedside Ultrasound Not performed.  My interpretation: n/a  FHT NST pending  Labs No results found for this or any previous visit (from the past 24 hours).  Imaging No results found.  MAU Course  Procedures Lab Orders         Urinalysis, Routine w reflex microscopic -Urine, Clean Catch         Comprehensive metabolic panel    Meds ordered this encounter  Medications   lactated ringers bolus 1,000 mL   prochlorperazine (COMPAZINE) injection 10 mg   ondansetron (ZOFRAN) injection 4 mg   haloperidol lactate (HALDOL) injection 0.5 mg   Imaging Orders  No imaging studies ordered today    MDM Moderate (Level 3-4)  Assessment and Plan  #Nausea and vomiting of pregnancy #Diarrhea #[redacted] weeks gestation of pregnancy Patient presenting with several days of nausea, vomiting, and diarrhea. Suspect viral illness such as norovirus as this has been circulating. Does have hyperemesis but diarrhea makes acute viral illness more likely. Will get CMP and treat aggressively to see if we can get her symptoms under control and have her tolerate a PO challenge.   #FWB NST pending at time of handoff   Dispo:  care handed off to Wynelle Bourgeois CNM at change of shift     Venora Maples, MD/MPH 02/23/23 8:33  PM

## 2023-02-23 NOTE — MAU Provider Note (Addendum)
History     [id number]  Arrival date and time: 02/23/23 1825    Chief Complaint  Patient presents with   Emesis   Diarrhea   Headache     HPI Heather Malone is a 32 y.o. at [redacted]w[redacted]d with PMHx notable for anemia, chronic headaches, history of severe PreE, recent admission this pregnancy for unexplained abdominal pain and intractable n/v, who presents for n/v/d.   Review of discharge summary from last admission: admitted 02/11/23 - 02/18/23 for abdominal pain of unclear etiology, had multiple imaging procedures and several consults, no clear etiology. Started on pain meds, PT regimen. Also had significant n/v, started on steroids.    Patient reports feeling unwell for about the past three days Had been doing OK since going home then three nights ago began to have nausea, vomiting, and diarrhea Has been having trouble taking her nausea meds Has been able to drink some small amounts but overall not really eating or drinking No new or unusual foods Denies any sick contacts at home   --/--/B POS (01/15 2006)  OB History     Gravida  3   Para  2   Term  1   Preterm  1   AB  0   Living  2      SAB  0   IAB  0   Ectopic  0   Multiple  0   Live Births  2           Past Medical History:  Diagnosis Date   Anemia    Anxiety    Asthma    albuterol prn   Headache    migraines   Hx of trichomoniasis    Hx of varicella    Neck injury, subsequent encounter 07/26/2016   Placental abruption in third trimester 09/04/2014   Pre-eclampsia    Pregnancy induced hypertension    Subchorionic hemorrhage in third trimester 07/26/2014   Velamentous insertion of umbilical cord     Past Surgical History:  Procedure Laterality Date   TONSILLECTOMY      Family History  Problem Relation Age of Onset   Hypertension Mother    Hypertension Maternal Grandmother    Heart attack Maternal Grandmother    Hypertension Maternal Grandfather    Cancer Maternal Grandfather     Diabetes Paternal Grandmother     Social History   Socioeconomic History   Marital status: Single    Spouse name: Not on file   Number of children: Not on file   Years of education: Not on file   Highest education level: Not on file  Occupational History   Not on file  Tobacco Use   Smoking status: Former    Types: Cigars   Smokeless tobacco: Never  Vaping Use   Vaping status: Never Used  Substance and Sexual Activity   Alcohol use: Not Currently    Comment: weekends   Drug use: Not Currently    Types: Marijuana    Comment: none with + UPT   Sexual activity: Yes    Birth control/protection: None  Other Topics Concern   Not on file  Social History Narrative   Not on file   Social Drivers of Health   Financial Resource Strain: Not on file  Food Insecurity: No Food Insecurity (02/13/2023)   Hunger Vital Sign    Worried About Running Out of Food in the Last Year: Never true    Ran Out of Food in the Last Year:  Never true  Transportation Needs: No Transportation Needs (02/13/2023)   PRAPARE - Administrator, Civil Service (Medical): No    Lack of Transportation (Non-Medical): No  Physical Activity: Not on file  Stress: Not on file  Social Connections: Not on file  Intimate Partner Violence: Not At Risk (02/13/2023)   Humiliation, Afraid, Rape, and Kick questionnaire    Fear of Current or Ex-Partner: No    Emotionally Abused: No    Physically Abused: No    Sexually Abused: No    No Known Allergies  No current facility-administered medications on file prior to encounter.   Current Outpatient Medications on File Prior to Encounter  Medication Sig Dispense Refill   acetaminophen (TYLENOL) 325 MG tablet Take 2 tablets (650 mg total) by mouth every 4 (four) hours as needed (for pain scale < 4  OR  temperature  >/=  100.5 F). 60 tablet 0   cyclobenzaprine (FLEXERIL) 10 MG tablet Take 1 tablet (10 mg total) by mouth every 8 (eight) hours as needed for muscle  spasms. 60 tablet 1   Doxylamine-Pyridoxine ER (BONJESTA) 20-20 MG TBCR Take 1 tablet by mouth at bedtime. 60 tablet 3   famotidine (PEPCID) 20 MG tablet Take 1 tablet (20 mg total) by mouth 2 (two) times daily. 60 tablet 0   gabapentin (NEURONTIN) 300 MG capsule Take 1 capsule (300 mg total) by mouth 3 (three) times daily as needed. 60 capsule 1   hydrocortisone (ANUSOL-HC) 2.5 % rectal cream Place rectally 2 (two) times daily. 30 g 2   oxyCODONE (OXY IR/ROXICODONE) 5 MG immediate release tablet Take 1-2 tablets (5-10 mg total) by mouth every 6 (six) hours as needed for moderate pain (pain score 4-6) or severe pain (pain score 7-10). 30 tablet 0   polyethylene glycol powder (GLYCOLAX/MIRALAX) 17 GM/SCOOP powder Take 1 capful (17 g) with water by mouth every other day. 238 g 0   predniSONE (DELTASONE) 50 MG tablet Take 1 tablet (50 mg total) by mouth daily with breakfast for 5 days. 5 tablet 0   Prenatal Vit-Fe Fumarate-FA (MULTIVITAMIN-PRENATAL) 27-0.8 MG TABS tablet Take 1 tablet by mouth daily at 12 noon. 30 tablet 0   promethazine (PHENERGAN) 12.5 MG tablet Take 1 tablet (12.5 mg total) by mouth every 6 (six) hours as needed for nausea or vomiting. 30 tablet 0   senna-docusate (SENOKOT-S) 8.6-50 MG tablet Take 2 tablets by mouth daily. 60 tablet 0   simethicone (MYLICON) 80 MG chewable tablet Chew 1 tablet (80 mg total) by mouth 4 (four) times daily as needed for flatulence. 30 tablet 0   triamcinolone (KENALOG) 0.025 % ointment Apply 1 Application topically 2 (two) times daily. Do not use for longer than 7 days 30 g 0     ROS Pertinent positives and negative per HPI, all others reviewed and negative  Physical Exam   BP 107/67 (BP Location: Right Arm)   Pulse 87   Temp 97.9 F (36.6 C) (Oral)   Resp 17   Ht 5\' 2"  (1.575 m)   Wt 78.4 kg   LMP 08/18/2022 (Exact Date)   SpO2 100%   BMI 31.61 kg/m   Patient Vitals for the past 24 hrs:  BP Temp Temp src Pulse Resp SpO2 Height Weight   02/23/23 1919 107/67 97.9 F (36.6 C) Oral 87 17 100 % 5\' 2"  (1.575 m) 78.4 kg    Physical Exam Vitals reviewed.  Constitutional:      General: She is  not in acute distress.    Appearance: She is well-developed. She is not diaphoretic.  Eyes:     General: No scleral icterus. Pulmonary:     Effort: Pulmonary effort is normal. No respiratory distress.  Abdominal:     General: There is no distension.     Palpations: Abdomen is soft.     Tenderness: There is no abdominal tenderness. There is no guarding or rebound.  Skin:    General: Skin is warm and dry.  Neurological:     Mental Status: She is alert.     Coordination: Coordination normal.      Cervical Exam    Bedside Ultrasound Not performed.  My interpretation: n/a  FHT NST pending  Labs No results found for this or any previous visit (from the past 24 hours).  Imaging No results found.  MAU Course  Procedures Lab Orders         Urinalysis, Routine w reflex microscopic -Urine, Clean Catch         Comprehensive metabolic panel    Meds ordered this encounter  Medications   lactated ringers bolus 1,000 mL   prochlorperazine (COMPAZINE) injection 10 mg   ondansetron (ZOFRAN) injection 4 mg   haloperidol lactate (HALDOL) injection 0.5 mg   Imaging Orders  No imaging studies ordered today    MDM Moderate (Level 3-4)  Assessment and Plan  #Nausea and vomiting of pregnancy #Diarrhea #[redacted] weeks gestation of pregnancy Patient presenting with several days of nausea, vomiting, and diarrhea. Suspect viral illness such as norovirus as this has been circulating. Does have hyperemesis but diarrhea makes acute viral illness more likely. Will get CMP and treat aggressively to see if we can get her symptoms under control and have her tolerate a PO challenge.   #FWB NST pending at time of handoff   Dispo:  care handed off to Wynelle Bourgeois CNM at change of shift     Venora Maples, MD/MPH 02/23/23 8:33  PM  Results for orders placed or performed during the hospital encounter of 02/23/23 (from the past 24 hours)  Comprehensive metabolic panel     Status: Abnormal   Collection Time: 02/23/23  9:00 PM  Result Value Ref Range   Sodium 136 135 - 145 mmol/L   Potassium 3.7 3.5 - 5.1 mmol/L   Chloride 104 98 - 111 mmol/L   CO2 21 (L) 22 - 32 mmol/L   Glucose, Bld 73 70 - 99 mg/dL   BUN 5 (L) 6 - 20 mg/dL   Creatinine, Ser 1.61 (L) 0.44 - 1.00 mg/dL   Calcium 8.2 (L) 8.9 - 10.3 mg/dL   Total Protein 6.0 (L) 6.5 - 8.1 g/dL   Albumin 2.7 (L) 3.5 - 5.0 g/dL   AST 18 15 - 41 U/L   ALT 18 0 - 44 U/L   Alkaline Phosphatase 35 (L) 38 - 126 U/L   Total Bilirubin 0.6 0.0 - 1.2 mg/dL   GFR, Estimated >09 >60 mL/min   Anion gap 11 5 - 15   Reviewed labs are normal  States she feels better No diarrhea recently  No vomiting Able to tolerate PO intake  A:  Single IUP at [redacted]w[redacted]d       Nausea, vomiting and diarrhea       Dehydration  P:  Discharge home       Supportive care       Advance diet as tolerated       Has meds at home  Encouraged to return if she develops worsening of symptoms, increase in pain, fever, or other concerning symptoms.   Aviva Signs, CNM

## 2023-02-24 ENCOUNTER — Other Ambulatory Visit: Payer: Self-pay

## 2023-02-24 DIAGNOSIS — O0992 Supervision of high risk pregnancy, unspecified, second trimester: Secondary | ICD-10-CM

## 2023-02-25 ENCOUNTER — Other Ambulatory Visit: Payer: Medicaid Other

## 2023-02-25 ENCOUNTER — Ambulatory Visit: Payer: Medicaid Other | Admitting: Certified Nurse Midwife

## 2023-02-25 VITALS — BP 126/80 | HR 88 | Wt 174.5 lb

## 2023-02-25 DIAGNOSIS — O0992 Supervision of high risk pregnancy, unspecified, second trimester: Secondary | ICD-10-CM

## 2023-02-25 DIAGNOSIS — G44229 Chronic tension-type headache, not intractable: Secondary | ICD-10-CM

## 2023-02-25 DIAGNOSIS — Z3A25 25 weeks gestation of pregnancy: Secondary | ICD-10-CM | POA: Diagnosis not present

## 2023-02-25 DIAGNOSIS — R109 Unspecified abdominal pain: Secondary | ICD-10-CM

## 2023-02-25 DIAGNOSIS — R1110 Vomiting, unspecified: Secondary | ICD-10-CM

## 2023-02-25 DIAGNOSIS — O99013 Anemia complicating pregnancy, third trimester: Secondary | ICD-10-CM

## 2023-02-25 DIAGNOSIS — O26892 Other specified pregnancy related conditions, second trimester: Secondary | ICD-10-CM | POA: Diagnosis not present

## 2023-02-25 DIAGNOSIS — O26899 Other specified pregnancy related conditions, unspecified trimester: Secondary | ICD-10-CM

## 2023-02-25 DIAGNOSIS — O99012 Anemia complicating pregnancy, second trimester: Secondary | ICD-10-CM

## 2023-02-26 ENCOUNTER — Telehealth: Payer: Self-pay | Admitting: Pharmacy Technician

## 2023-02-26 LAB — CBC
Hematocrit: 26.7 % — ABNORMAL LOW (ref 34.0–46.6)
Hemoglobin: 8.6 g/dL — ABNORMAL LOW (ref 11.1–15.9)
MCH: 27.8 pg (ref 26.6–33.0)
MCHC: 32.2 g/dL (ref 31.5–35.7)
MCV: 86 fL (ref 79–97)
Platelets: 292 10*3/uL (ref 150–450)
RBC: 3.09 x10E6/uL — ABNORMAL LOW (ref 3.77–5.28)
RDW: 13.1 % (ref 11.7–15.4)
WBC: 4.5 10*3/uL (ref 3.4–10.8)

## 2023-02-26 LAB — GLUCOSE TOLERANCE, 2 HOURS W/ 1HR
Glucose, 1 hour: 130 mg/dL (ref 70–179)
Glucose, 2 hour: 121 mg/dL (ref 70–152)
Glucose, Fasting: 79 mg/dL (ref 70–91)

## 2023-02-26 LAB — RPR: RPR Ser Ql: NONREACTIVE

## 2023-02-26 LAB — HIV ANTIBODY (ROUTINE TESTING W REFLEX): HIV Screen 4th Generation wRfx: NONREACTIVE

## 2023-02-26 NOTE — Telephone Encounter (Signed)
Colan Neptune note:  Patient will be scheduled as soon as possible.  Auth Submission: NO AUTH NEEDED Site of care: Site of care: [facility] Payer: [employer] Medication & CPT/J Code(s) submitted: Venofer (Iron Sucrose) J1756 Route of submission (phone, fax, portal):  Phone # Fax # Auth type: Buy/Bill PB Units/visits requested: 5 Reference number:  Approval from: 02/26/23 to 07/27/23

## 2023-02-27 ENCOUNTER — Encounter: Payer: Self-pay | Admitting: Certified Nurse Midwife

## 2023-03-02 ENCOUNTER — Encounter: Payer: Self-pay | Admitting: *Deleted

## 2023-03-06 ENCOUNTER — Ambulatory Visit (INDEPENDENT_AMBULATORY_CARE_PROVIDER_SITE_OTHER): Payer: Medicaid Other

## 2023-03-06 ENCOUNTER — Ambulatory Visit: Payer: Medicaid Other | Admitting: Physician Assistant

## 2023-03-06 ENCOUNTER — Encounter: Payer: Self-pay | Admitting: Physician Assistant

## 2023-03-06 VITALS — BP 104/68 | HR 83 | Temp 98.3°F | Resp 18 | Ht 62.0 in | Wt 169.8 lb

## 2023-03-06 VITALS — BP 117/73 | HR 90

## 2023-03-06 DIAGNOSIS — G43009 Migraine without aura, not intractable, without status migrainosus: Secondary | ICD-10-CM

## 2023-03-06 DIAGNOSIS — Z3A27 27 weeks gestation of pregnancy: Secondary | ICD-10-CM

## 2023-03-06 DIAGNOSIS — O26892 Other specified pregnancy related conditions, second trimester: Secondary | ICD-10-CM

## 2023-03-06 DIAGNOSIS — G4440 Drug-induced headache, not elsewhere classified, not intractable: Secondary | ICD-10-CM | POA: Diagnosis not present

## 2023-03-06 DIAGNOSIS — O99013 Anemia complicating pregnancy, third trimester: Secondary | ICD-10-CM

## 2023-03-06 DIAGNOSIS — O99012 Anemia complicating pregnancy, second trimester: Secondary | ICD-10-CM

## 2023-03-06 DIAGNOSIS — O26893 Other specified pregnancy related conditions, third trimester: Secondary | ICD-10-CM | POA: Insufficient documentation

## 2023-03-06 DIAGNOSIS — D508 Other iron deficiency anemias: Secondary | ICD-10-CM | POA: Diagnosis not present

## 2023-03-06 DIAGNOSIS — R1110 Vomiting, unspecified: Secondary | ICD-10-CM

## 2023-03-06 DIAGNOSIS — R519 Headache, unspecified: Secondary | ICD-10-CM | POA: Diagnosis not present

## 2023-03-06 MED ORDER — LACTATED RINGERS IV SOLN
Freq: Once | INTRAVENOUS | Status: DC
  Filled 2023-03-06: qty 10

## 2023-03-06 MED ORDER — BUTALBITAL-APAP-CAFFEINE 50-325-40 MG PO CAPS: 1.0000 | ORAL_CAPSULE | Freq: Four times a day (QID) | ORAL | 1 refills | Status: DC | PRN

## 2023-03-06 MED ORDER — IRON SUCROSE 20 MG/ML IV SOLN
200.0000 mg | Freq: Once | INTRAVENOUS | Status: AC
  Administered 2023-03-06: 200 mg via INTRAVENOUS
  Filled 2023-03-06: qty 10

## 2023-03-06 MED ORDER — SODIUM CHLORIDE 0.9 % IV SOLN
25.0000 mg | INTRAVENOUS | Status: DC
  Filled 2023-03-06: qty 1

## 2023-03-06 MED ORDER — ACETAMINOPHEN 325 MG PO TABS
650.0000 mg | ORAL_TABLET | Freq: Once | ORAL | Status: AC
  Administered 2023-03-06: 650 mg via ORAL
  Filled 2023-03-06: qty 2

## 2023-03-06 MED ORDER — DIPHENHYDRAMINE HCL 25 MG PO CAPS
25.0000 mg | ORAL_CAPSULE | Freq: Once | ORAL | Status: AC
  Administered 2023-03-06: 25 mg via ORAL
  Filled 2023-03-06: qty 1

## 2023-03-06 MED ORDER — PROMETHAZINE HCL 12.5 MG RE SUPP: 12.5000 mg | Freq: Four times a day (QID) | RECTAL | 0 refills | Status: DC | PRN

## 2023-03-06 NOTE — Progress Notes (Signed)
 Diagnosis: Iron Deficiency Anemia  Provider:  Chilton Greathouse MD  Procedure: IV Push  IV Type: Peripheral, IV Location: L Antecubital  Venofer (Iron Sucrose), Dose: 200 mg  Post Infusion IV Care: Observation period completed and Peripheral IV Discontinued  Discharge: Condition: Good, Destination: Home . AVS Declined  Performed by:  Adriana Mccallum, RN

## 2023-03-06 NOTE — Patient Instructions (Signed)
 Aaron Aas

## 2023-03-06 NOTE — Progress Notes (Signed)
 ;New Headache:   Dx with headaches in 2013  Tylenol - no benefit  Flexeril - helps for couple of hours  Entire head sometimes  Does have spots with headaches  Family hx- None  Worse with this pregnancy  Pressure like headache today on left side  Sometimes nausea and vomiting with headaches  Light sensitivity   History:  Heather Malone is a 33 y.o. [id number] who presents to clinic today for headache.  New eval.  Pain is left temple today with throbbing.  It gets to be severe.  It can last for days.  It is worse with movement.  THere is nausea and vomiting.  Lights bother her but not noise.  She does see speckles with the headache but there is no warning before the pain.  She is having blurry vision - not sure if this is related to HA or pregnancy.  She takes gabapentin  2 times daily for nerve issue.  She has been using flexeril /tylenol  for HA.  She has had injections in her head in the past.  Currently has HA 3/10.  Zofran  helps with nausea Currently walking with a walker. She notes she has discontinued oxycodone  as she did not want her baby to become addicted.  She uses tylenol  daily.  She infrequently has caffeinated beverage.   HIT6: Number of days in the last 4 weeks with:  Severe headache: 14 Moderate headache: 7 Mild headache: 4  No headache: 3   Past Medical History:  Diagnosis Date   Anemia    Anxiety    Asthma    albuterol  prn   Headache    migraines   Hx of trichomoniasis    Hx of varicella    Neck injury, subsequent encounter 07/26/2016   Placental abruption in third trimester 09/04/2014   Pre-eclampsia    Pregnancy induced hypertension    Subchorionic hemorrhage in third trimester 07/26/2014   Velamentous insertion of umbilical cord     Social History   Socioeconomic History   Marital status: Single    Spouse name: Not on file   Number of children: Not on file   Years of education: Not on file   Highest education level: Not on file  Occupational History    Not on file  Tobacco Use   Smoking status: Former    Types: Cigars   Smokeless tobacco: Never  Vaping Use   Vaping status: Never Used  Substance and Sexual Activity   Alcohol use: Not Currently    Comment: weekends   Drug use: Not Currently    Types: Marijuana    Comment: none with + UPT   Sexual activity: Yes    Birth control/protection: None  Other Topics Concern   Not on file  Social History Narrative   Not on file   Social Drivers of Health   Financial Resource Strain: Not on file  Food Insecurity: No Food Insecurity (02/13/2023)   Hunger Vital Sign    Worried About Running Out of Food in the Last Year: Never true    Ran Out of Food in the Last Year: Never true  Transportation Needs: No Transportation Needs (02/13/2023)   PRAPARE - Administrator, Civil Service (Medical): No    Lack of Transportation (Non-Medical): No  Physical Activity: Not on file  Stress: Not on file  Social Connections: Not on file  Intimate Partner Violence: Not At Risk (02/13/2023)   Humiliation, Afraid, Rape, and Kick questionnaire    Fear of Current or  Ex-Partner: No    Emotionally Abused: No    Physically Abused: No    Sexually Abused: No    Family History  Problem Relation Age of Onset   Hypertension Mother    Hypertension Maternal Grandmother    Heart attack Maternal Grandmother    Hypertension Maternal Grandfather    Cancer Maternal Grandfather    Diabetes Paternal Grandmother     No Known Allergies  Current Outpatient Medications on File Prior to Visit  Medication Sig Dispense Refill   acetaminophen  (TYLENOL ) 325 MG tablet Take 2 tablets (650 mg total) by mouth every 4 (four) hours as needed (for pain scale < 4  OR  temperature  >/=  100.5 F). 60 tablet 0   cyclobenzaprine  (FLEXERIL ) 10 MG tablet Take 1 tablet (10 mg total) by mouth every 8 (eight) hours as needed for muscle spasms. 60 tablet 1   Doxylamine -Pyridoxine ER (BONJESTA ) 20-20 MG TBCR Take 1 tablet by mouth  at bedtime. 60 tablet 3   famotidine  (PEPCID ) 20 MG tablet Take 1 tablet (20 mg total) by mouth 2 (two) times daily. 60 tablet 0   gabapentin  (NEURONTIN ) 300 MG capsule Take 1 capsule (300 mg total) by mouth 3 (three) times daily as needed. 60 capsule 1   Prenatal Vit-Fe Fumarate-FA (MULTIVITAMIN-PRENATAL) 27-0.8 MG TABS tablet Take 1 tablet by mouth daily at 12 noon. 30 tablet 0   senna-docusate (SENOKOT-S) 8.6-50 MG tablet Take 2 tablets by mouth daily. 60 tablet 0   simethicone  (MYLICON) 80 MG chewable tablet Chew 1 tablet (80 mg total) by mouth 4 (four) times daily as needed for flatulence. 30 tablet 0   hydrocortisone  (ANUSOL -HC) 2.5 % rectal cream Place rectally 2 (two) times daily. 30 g 2   oxyCODONE  (OXY IR/ROXICODONE ) 5 MG immediate release tablet Take 1-2 tablets (5-10 mg total) by mouth every 6 (six) hours as needed for moderate pain (pain score 4-6) or severe pain (pain score 7-10). (Patient not taking: Reported on 03/06/2023) 30 tablet 0   polyethylene glycol powder (GLYCOLAX /MIRALAX ) 17 GM/SCOOP powder Take 1 capful (17 g) with water by mouth every other day. (Patient not taking: Reported on 03/06/2023) 238 g 0   promethazine  (PHENERGAN ) 12.5 MG tablet Take 1 tablet (12.5 mg total) by mouth every 6 (six) hours as needed for nausea or vomiting. (Patient not taking: Reported on 03/06/2023) 30 tablet 0   triamcinolone  (KENALOG ) 0.025 % ointment Apply 1 Application topically 2 (two) times daily. Do not use for longer than 7 days (Patient not taking: Reported on 03/06/2023) 30 g 0   No current facility-administered medications on file prior to visit.     Review of Systems:  All pertinent positive/negative included in HPI, all other review of systems are negative   Objective:  Physical Exam BP 117/73   Pulse 90   LMP 08/18/2022 (Exact Date)  CONSTITUTIONAL: Well-developed, well-nourished female in no acute distress.  EYES: EOM intact ENT: Normocephalic CARDIOVASCULAR: Regular rate    RESPIRATORY: Normal rate.  MUSCULOSKELETAL: Normal ROM SKIN: Warm, dry without erythema  NEUROLOGICAL: Alert, oriented, CN II-XII grossly intact PSYCH: Normal behavior, mood   Assessment & Plan:  Assessment: 1. Migraine without aura and without status migrainosus, not intractable   2. Rebound headache   3. Pregnancy headache in second trimester      Plan: Cyclobenzaprine  for HA. Pt states no sedation with this and therefore may use up to TID.  Sedation precautions.   Pt notes she is not driving herself these  days. Pt to decrease and hopefully eliminate Tylenol  to decrease rebound HA.  Fioricet  for HA that does not respond to cyclobenzaprine  Phenergan  suppository for HA rescue. Sedation precautions.  Do not use with zofran .  May use 1 or 2 of the 12.5mg  dose.   Follow-up in 1 months or sooner PRN  45 minutes of direct patient care this encounter.  Nance Gretta Darice FORBES, PA-C 03/06/2023 11:45 AM

## 2023-03-06 NOTE — Patient Instructions (Signed)

## 2023-03-11 ENCOUNTER — Ambulatory Visit (INDEPENDENT_AMBULATORY_CARE_PROVIDER_SITE_OTHER): Payer: Medicaid Other | Admitting: Certified Nurse Midwife

## 2023-03-11 ENCOUNTER — Other Ambulatory Visit: Payer: Self-pay

## 2023-03-11 ENCOUNTER — Ambulatory Visit: Payer: Medicaid Other | Admitting: *Deleted

## 2023-03-11 VITALS — BP 121/83 | HR 82 | Temp 97.8°F | Resp 16 | Ht 62.0 in | Wt 170.0 lb

## 2023-03-11 VITALS — BP 119/86 | HR 101 | Wt 169.7 lb

## 2023-03-11 DIAGNOSIS — O0992 Supervision of high risk pregnancy, unspecified, second trimester: Secondary | ICD-10-CM

## 2023-03-11 DIAGNOSIS — L299 Pruritus, unspecified: Secondary | ICD-10-CM

## 2023-03-11 DIAGNOSIS — Z3A27 27 weeks gestation of pregnancy: Secondary | ICD-10-CM | POA: Diagnosis not present

## 2023-03-11 DIAGNOSIS — O26892 Other specified pregnancy related conditions, second trimester: Secondary | ICD-10-CM

## 2023-03-11 DIAGNOSIS — R12 Heartburn: Secondary | ICD-10-CM

## 2023-03-11 DIAGNOSIS — R1110 Vomiting, unspecified: Secondary | ICD-10-CM

## 2023-03-11 MED ORDER — OMEPRAZOLE 40 MG PO CPDR: 40.0000 mg | DELAYED_RELEASE_CAPSULE | Freq: Every day | ORAL | 4 refills | Status: DC

## 2023-03-11 MED ORDER — LACTATED RINGERS IV SOLN
Freq: Once | INTRAVENOUS | Status: AC
  Filled 2023-03-11: qty 10

## 2023-03-11 MED ORDER — SODIUM CHLORIDE 0.9 % IV SOLN
25.0000 mg | INTRAVENOUS | Status: DC
  Administered 2023-03-11: 25 mg via INTRAVENOUS
  Filled 2023-03-11: qty 1

## 2023-03-11 NOTE — Progress Notes (Signed)
Diagnosis: Hydration  Provider:  Chilton Greathouse MD  Procedure: IV Infusion  IV Type: Peripheral, IV Location: R Antecubital  Banana Bag, Dose: 1000 ml  Infusion Start Time: 1322 pm  Infusion Stop Time: 1440 pm  Post Infusion IV Care: Observation period completed and Peripheral IV Discontinued  Discharge: Condition: Good, Destination: Home . AVS Declined  Performed by:  Forrest Moron, RN     Normal Saline, Dose: 1000 mland Phenergan 25mg   Infusion Start Time: 1441 pm  Infusion Stop Time: 1605 pm

## 2023-03-11 NOTE — Progress Notes (Signed)
   PRENATAL VISIT NOTE  Subjective:  Heather Malone is a 33 y.o. G3P1102 at [redacted]w[redacted]d being seen today for ongoing prenatal care.  She is currently monitored for the following issues for this high-risk pregnancy and has Chronic headaches; Alpha thalassemia silent carrier; Anemia in pregnancy; Elevated blood pressure complicating pregnancy in first trimester, antepartum; Supervision of high-risk pregnancy; History of severe pre-eclampsia; Hyperemesis; Abdominal pain affecting pregnancy; Abdominal pain in pregnancy, second trimester; Migraine without aura and without status migrainosus, not intractable; Rebound headache; and Pregnancy headache in second trimester on their problem list.  Patient reports  itching to hands and feet, some on legs, no rash. Belly pain is much better, still needing a Esiah Bazinet to ambulate. Very ready for the end of this pregnancy. Taking pepcid for heartburn but it is not working anymore .  Contractions: Irregular. Vag. Bleeding: None.  Movement: Present. Denies leaking of fluid.   The following portions of the patient's history were reviewed and updated as appropriate: allergies, current medications, past family history, past medical history, past social history, past surgical history and problem list.   Objective:   Vitals:   03/11/23 1036  BP: 119/86  Pulse: (!) 101  Weight: 169 lb 11.2 oz (77 kg)   Fetal Status: Fetal Heart Rate (bpm): 150 Fundal Height: 27 cm Movement: Present     General:  Alert, oriented and cooperative. Patient is in no acute distress.  Skin: Skin is warm and dry. No rash noted.   Cardiovascular: Normal heart rate noted  Respiratory: Normal respiratory effort, no problems with respiration noted  Abdomen: Soft, gravid, appropriate for gestational age.  Pain/Pressure: Present     Pelvic: Cervical exam deferred        Extremities: Normal range of motion.  Edema: None  Mental Status: Normal mood and affect. Normal behavior. Normal judgment and  thought content.   Assessment and Plan:  Pregnancy: G3P1102 at [redacted]w[redacted]d 1. Supervision of high risk pregnancy in second trimester (Primary) - Feeling regular fetal movement.  2. [redacted] weeks gestation of pregnancy - Routine OB care   3. Pruritus - Bile acids, total - Comp Met (CMET)  4. Heartburn during pregnancy in second trimester - omeprazole (PRILOSEC) 40 MG capsule; Take 1 capsule (40 mg total) by mouth daily.  Dispense: 30 capsule; Refill: 4  Preterm labor symptoms and general obstetric precautions including but not limited to vaginal bleeding, contractions, leaking of fluid and fetal movement were reviewed in detail with the patient. Please refer to After Visit Summary for other counseling recommendations.   Return in about 2 weeks (around 03/25/2023) for IN-PERSON, HOB.  Future Appointments  Date Time Provider Department Center  03/13/2023  3:00 PM [facility] None  03/24/2023 10:15 AM Helmus, Jitka, PT [facility] None  03/24/2023  3:15 PM [facility] NURSE [facility]  03/24/2023  3:30 PM [facility] US5 [facility]  03/25/2023 11:15 AM Bernerd Limbo, CNM [facility]  03/27/2023  8:00 AM Teague Edwena Blow, PA-C [facility]   Bernerd Limbo, CNM

## 2023-03-12 LAB — COMPREHENSIVE METABOLIC PANEL
ALT: 7 [IU]/L (ref 0–32)
AST: 11 [IU]/L (ref 0–40)
Albumin: 4 g/dL (ref 3.9–4.9)
Alkaline Phosphatase: 51 [IU]/L (ref 44–121)
BUN/Creatinine Ratio: 9 (ref 9–23)
BUN: 4 mg/dL — ABNORMAL LOW (ref 6–20)
Bilirubin Total: 0.3 mg/dL (ref 0.0–1.2)
CO2: 19 mmol/L — ABNORMAL LOW (ref 20–29)
Calcium: 9 mg/dL (ref 8.7–10.2)
Chloride: 101 mmol/L (ref 96–106)
Creatinine, Ser: 0.43 mg/dL — ABNORMAL LOW (ref 0.57–1.00)
Globulin, Total: 2.8 g/dL (ref 1.5–4.5)
Glucose: 77 mg/dL (ref 70–99)
Potassium: 4.3 mmol/L (ref 3.5–5.2)
Sodium: 135 mmol/L (ref 134–144)
Total Protein: 6.8 g/dL (ref 6.0–8.5)
eGFR: 132 mL/min/{1.73_m2} (ref >59)

## 2023-03-12 LAB — BILE ACIDS, TOTAL: Bile Acids Total: <1 umol/L (ref 0.0–10.0)

## 2023-03-13 ENCOUNTER — Ambulatory Visit: Payer: Medicaid Other

## 2023-03-13 VITALS — BP 113/75 | HR 91 | Temp 98.0°F | Resp 20 | Ht 62.0 in | Wt 169.2 lb

## 2023-03-13 DIAGNOSIS — O99013 Anemia complicating pregnancy, third trimester: Secondary | ICD-10-CM | POA: Diagnosis not present

## 2023-03-13 DIAGNOSIS — D508 Other iron deficiency anemias: Secondary | ICD-10-CM | POA: Diagnosis not present

## 2023-03-13 DIAGNOSIS — Z3A28 28 weeks gestation of pregnancy: Secondary | ICD-10-CM | POA: Diagnosis not present

## 2023-03-13 MED ORDER — IRON SUCROSE 20 MG/ML IV SOLN
200.0000 mg | Freq: Once | INTRAVENOUS | Status: AC
  Administered 2023-03-13: 200 mg via INTRAVENOUS
  Filled 2023-03-13: qty 10

## 2023-03-13 MED ORDER — ACETAMINOPHEN 325 MG PO TABS
650.0000 mg | ORAL_TABLET | Freq: Once | ORAL | Status: AC
Start: 2023-03-13 — End: 2023-03-13
  Administered 2023-03-13: 650 mg via ORAL
  Filled 2023-03-13: qty 2

## 2023-03-13 MED ORDER — DIPHENHYDRAMINE HCL 25 MG PO CAPS
25.0000 mg | ORAL_CAPSULE | Freq: Once | ORAL | Status: AC
Start: 2023-03-13 — End: 2023-03-13
  Administered 2023-03-13: 25 mg via ORAL
  Filled 2023-03-13: qty 1

## 2023-03-13 NOTE — Progress Notes (Signed)
Diagnosis: Iron Deficiency Anemia  Provider:  Chilton Greathouse MD  Procedure: IV Push  IV Type: Peripheral, IV Location: R Antecubital  Venofer (Iron Sucrose), Dose: 200 mg  Post Infusion IV Care: Observation period completed and Peripheral IV Discontinued  Discharge: Condition: Good, Destination: Home . AVS Declined  Performed by:  Rico Ala, LPN

## 2023-03-16 ENCOUNTER — Encounter: Payer: Self-pay | Admitting: Certified Nurse Midwife

## 2023-03-16 ENCOUNTER — Ambulatory Visit: Payer: Medicaid Other

## 2023-03-16 VITALS — BP 110/73 | HR 81 | Temp 98.7°F | Resp 16 | Ht 62.0 in | Wt 172.2 lb

## 2023-03-16 DIAGNOSIS — O99013 Anemia complicating pregnancy, third trimester: Secondary | ICD-10-CM

## 2023-03-16 DIAGNOSIS — D508 Other iron deficiency anemias: Secondary | ICD-10-CM

## 2023-03-16 DIAGNOSIS — Z3A28 28 weeks gestation of pregnancy: Secondary | ICD-10-CM | POA: Diagnosis not present

## 2023-03-16 MED ORDER — IRON SUCROSE 20 MG/ML IV SOLN
200.0000 mg | Freq: Once | INTRAVENOUS | Status: AC
  Administered 2023-03-16: 200 mg via INTRAVENOUS
  Filled 2023-03-16: qty 10

## 2023-03-16 MED ORDER — DIPHENHYDRAMINE HCL 25 MG PO CAPS
25.0000 mg | ORAL_CAPSULE | Freq: Once | ORAL | Status: AC
Start: 2023-03-16 — End: 2023-03-16
  Administered 2023-03-16: 25 mg via ORAL
  Filled 2023-03-16: qty 1

## 2023-03-16 MED ORDER — ACETAMINOPHEN 325 MG PO TABS
650.0000 mg | ORAL_TABLET | Freq: Once | ORAL | Status: AC
  Administered 2023-03-16: 650 mg via ORAL
  Filled 2023-03-16: qty 2

## 2023-03-16 NOTE — Progress Notes (Signed)
 Diagnosis: Acute Anemia  Provider:  Chilton Greathouse MD  Procedure: IV Push  IV Type: Peripheral, IV Location: R Antecubital  Venofer (Iron Sucrose), Dose: 200 mg  Post Infusion IV Care: Observation period completed and Peripheral IV Discontinued  Discharge: Condition: Good, Destination: Home . AVS Declined  Performed by:  Nat Math, RN

## 2023-03-18 ENCOUNTER — Ambulatory Visit (HOSPITAL_BASED_OUTPATIENT_CLINIC_OR_DEPARTMENT_OTHER): Payer: Medicaid Other | Attending: Obstetrics and Gynecology | Admitting: Physical Therapy

## 2023-03-18 ENCOUNTER — Encounter (HOSPITAL_BASED_OUTPATIENT_CLINIC_OR_DEPARTMENT_OTHER): Payer: Self-pay | Admitting: Physical Therapy

## 2023-03-18 ENCOUNTER — Ambulatory Visit: Payer: Self-pay | Admitting: Physical Therapy

## 2023-03-18 ENCOUNTER — Ambulatory Visit: Payer: Medicaid Other

## 2023-03-18 DIAGNOSIS — O09213 Supervision of pregnancy with history of pre-term labor, third trimester: Secondary | ICD-10-CM | POA: Insufficient documentation

## 2023-03-18 DIAGNOSIS — O09293 Supervision of pregnancy with other poor reproductive or obstetric history, third trimester: Secondary | ICD-10-CM | POA: Insufficient documentation

## 2023-03-18 DIAGNOSIS — Z148 Genetic carrier of other disease: Secondary | ICD-10-CM | POA: Diagnosis not present

## 2023-03-18 DIAGNOSIS — O402XX1 Polyhydramnios, second trimester, fetus 1: Secondary | ICD-10-CM | POA: Insufficient documentation

## 2023-03-18 DIAGNOSIS — M6281 Muscle weakness (generalized): Secondary | ICD-10-CM

## 2023-03-18 DIAGNOSIS — Z3A29 29 weeks gestation of pregnancy: Secondary | ICD-10-CM | POA: Insufficient documentation

## 2023-03-18 NOTE — Therapy (Signed)
OUTPATIENT PHYSICAL THERAPY FEMALE PELVIC TREATMENT   Patient Name: Heather Malone MRN: [id number] DOB:12/10/1990, 33 y.o., female Today's Date: 03/18/2023  END OF SESSION:  PT End of Session - 03/18/23 1121     Visit Number 4    Authorization Type [employer]    Authorization Time Period no auth required    PT Start Time 1020    PT Stop Time 1101    PT Time Calculation (min) 41 min    Activity Tolerance Patient tolerated treatment well    Behavior During Therapy WFL for tasks assessed/performed                Past Medical History:  Diagnosis Date   Anemia    Anxiety    Asthma    albuterol prn   Headache    migraines   Hx of trichomoniasis    Hx of varicella    Neck injury, subsequent encounter 07/26/2016   Placental abruption in third trimester 09/04/2014   Pre-eclampsia    Pregnancy induced hypertension    Subchorionic hemorrhage in third trimester 07/26/2014   Velamentous insertion of umbilical cord    Past Surgical History:  Procedure Laterality Date   TONSILLECTOMY     Patient Active Problem List   Diagnosis Date Noted   Migraine without aura and without status migrainosus, not intractable 03/06/2023   Rebound headache 03/06/2023   Pregnancy headache in second trimester 03/06/2023   Abdominal pain in pregnancy, second trimester 02/12/2023   Abdominal pain affecting pregnancy 02/11/2023   Hyperemesis 12/10/2022   Supervision of high-risk pregnancy 10/29/2022   History of severe pre-eclampsia 10/29/2022   Elevated blood pressure complicating pregnancy in first trimester, antepartum 10/01/2022   Anemia in pregnancy 08/05/2020   Alpha thalassemia silent carrier 08/02/2020   Chronic headaches 04/12/2020    PCP: Patient, No Pcp Per PCP - General  REFERRING PROVIDER: Reva Bores, MD Ref Provider  REFERRING DIAG: R10.30 (ICD-10-CM) - Lower abdominal pain    THERAPY DIAG:  Muscle weakness (generalized)  Rationale for Evaluation and  Treatment: Rehabilitation  ONSET DATE: 10/2022  SUBJECTIVE:                                                                                                                                                                                           SUBJECTIVE STATEMENT: Pt reports she is not afraid of water. He boyfriend has membership to [employer], but she does not have membership.   She arrives using RW.   Boyfriend present on deck for beginning portion of treatment.   PAIN:  Are you having pain? Yes NPRS scale: 5/10 Pain  location:  abdomen- like lightning bolts, low back radiating down to R toes  Pain type: burning  Pain description: intermittent and constant   Aggravating factors: sitting too long, walking too much Relieving factors: ice packs on backs, bending down  PRECAUTIONS: None  RED FLAGS: None   WEIGHT BEARING RESTRICTIONS: No  FALLS:  Has patient fallen in last 6 months? No  LIVING ENVIRONMENT: Lives with: lives with their family Lives in: House/apartment Stairs: No Has following equipment at home: None  OCCUPATION: desk job, benefits specialist  PLOF: Independent  PATIENT GOALS: relieve the pain  PERTINENT HISTORY:  Nothing relevant Sexual abuse: No  BOWEL MOVEMENT:n/a   URINATION: no issues  INTERCOURSE: no issues  PREGNANCY: Vaginal deliveries 2 Tearing No C-section deliveries 0 Currently pregnant Yes: 19 weeks  PROLAPSE: None   OBJECTIVE:  Note: Objective measures were completed at Evaluation unless otherwise noted.     COGNITION: Overall cognitive status: Within functional limits for tasks assessed     SENSATION: Light touch: Appears intact Proprioception: Appears intact  MUSCLE LENGTH: Hamstrings: Right 80 deg; Left 80 deg   LUMBAR SPECIAL TESTS:  Stork standing: Positive for weakness bilat    GAIT: Comments: antalgic, wide stance  POSTURE: rounded shoulders, forward head, increased lumbar lordosis, and anterior  pelvic tilt  PELVIC ALIGNMENT: seems even  LUMBARAROM/PROM:  A/PROM A/PROM  eval  Flexion 50% difficult  Extension 50% difficult   LOWER EXTREMITY ROM: some limitations throughout d/t stiffness and pain  LOWER EXTREMITY MMT: at least 4/5 grossly overall    PALPATION:   General  abdomen- low tone                External Perineal Exam to be assessed as needed                             Internal Pelvic Floor to be assessed as needed  Patient confirms identification and approves PT to assess internal pelvic floor and treatment No  PELVIC MMT:   MMT eval  Vaginal   Internal Anal Sphincter   External Anal Sphincter   Puborectalis   Diastasis Recti   (Blank rows = not tested)        TONE: Low tone abdomen High tone and trigger points lumbar paraspinals Pelvic girdle external pressure relieved pain ( serola belt trial, manual pressure)  PROLAPSE: To be assessed if needed  TODAY'S TREATMENT:                                                                                                                              [facility] Adult PT Treatment:                                                DATE: 03/18/23  Pt seen for aquatic therapy today.  Treatment took place in water 3.5-4.75 ft in depth at the [facility] pool. Temp of water was 91.  Pt entered/exited the pool via stairs in step-to pattern with bilat rail.  UE on barbell in 4+ ft of water:  * Walking forward/ backward, multiple laps with RLE on down slope (facing hot tub)- cues for heel/toe and toe/ heel  * side stepping R/L 2 laps * marching backward/forward * unsupported - walking forward/ backward * UE on wall: hip abdct/ addct x 5; R hip ext to touch x 10 * supported by yellow noodle wrapped anteriorly, LE suspended - cycling (increased pain in RLE immediately) hip abdct/ addct (continued pain and return of radicular symptoms)  * return to walking backwards and forwards with barbell   Pt requires the  buoyancy and hydrostatic pressure of water for support, and to offload joints by unweighting joint load by at least 50 % in navel deep water and by at least 75-80% in chest to neck deep water.  Viscosity of the water is needed for resistance of strengthening. Water current perturbations provides challenge to standing balance requiring increased core activation.  ** pt transported back to lobby in [facility]; encouraged to utilize transportation chair with boyfriend's assist to get to/from pool area.   DATE: 02/20/23  Manual- STM lumbar paraspinals, QL  and right glutes  Therapeutic activities- trial of kinesiotaping- 4 strips herringbone pattern on abdomen Trial of sheet instead of belt- not tol well on abdomen                      HOME EXERCISE PROGRAM: [id number]  ASSESSMENT:  CLINICAL IMPRESSION: Pt demonstrates safety and independence in aquatic setting with therapist instructing from deck. Pt demonstrated improved confidence in water with UE support on barbell.  Pt is directed through various movement patterns and trials  in both sitting and standing positions. She reported reduction of radicular symptoms (from toes to knee) after 15 min of walking in water, and even further to hip after 30 minutes.  A brief trial of suspended cycling immediately increased radicular symptoms.  Overall pain level reduced 1-2 points during session.    Gait normalized when submerged 70% and with cues given. Encouraged pt to visit local pool outside therapy sessions for continued relief of symptoms and improvement of gait quality.   Goals are ongoing.      OBJECTIVE IMPAIRMENTS: decreased activity tolerance, decreased knowledge of condition, difficulty walking, decreased strength, increased muscle spasms, impaired flexibility, impaired tone, improper body mechanics, and pain.   ACTIVITY LIMITATIONS: carrying, lifting, bending, standing, squatting, sleeping, stairs, transfers, bed mobility, locomotion level, and caring  for others  PARTICIPATION LIMITATIONS: meal prep, cleaning, laundry, shopping, community activity, occupation, and yard work  PERSONAL FACTORS: Fitness and Time since onset of injury/illness/exacerbation are also affecting patient's functional outcome.   REHAB POTENTIAL: Good  CLINICAL DECISION MAKING: Stable/uncomplicated  EVALUATION COMPLEXITY: Low   GOALS: Goals reviewed with patient? Yes  SHORT TERM GOALS: Target date: 02/06/2023    Pt will be I with her HEP and demonstrate her exercises correctly Baseline: Goal status: INITIAL  2.  Pt will report max 4/10 pain with walking for 30 mins Baseline:  Goal status: INITIAL  3.  Pt will be able to lift her 2 yo child with good pressure management at least 50% of the time Baseline:  Goal status: INITIAL   LONG TERM GOALS: Target date: 04/03/2023  Pt will report max 2/10 low back and abdominal pain with 1 hour of walking  Baseline:  Goal status: INITIAL  2.  Pt will be I with advanced HEP and dem all exercises correctly Baseline:  Goal status: INITIAL  3.  Pt will be able to sleep at least 5 consecutive hours at night without increased pain Baseline:  Goal status: INITIAL   PLAN:  PT FREQUENCY: 1-2x/week  PT DURATION: 12 weeks  PLANNED INTERVENTIONS: 97110-Therapeutic exercises, 97530- Therapeutic activity, 97112- Neuromuscular re-education, 97535- Self Care, [id number]- Manual therapy, [phone]- Electrical stimulation (manual), Patient/Family education, Taping, Dry Needling, Joint mobilization, Joint manipulation, Spinal manipulation, Spinal mobilization, Moist heat, and Biofeedback  PLAN FOR NEXT SESSION: continue exercises, teach pt kinesio taping for abdomen; assess response to aquatic therapy   Mayer Camel, PTA 03/18/23 11:22 AM [facility] [address] [place], [place], [address] Phone: [phone]   Fax:  [phone]

## 2023-03-19 ENCOUNTER — Encounter: Payer: Self-pay | Admitting: Obstetrics & Gynecology

## 2023-03-23 ENCOUNTER — Ambulatory Visit: Payer: Medicaid Other

## 2023-03-23 ENCOUNTER — Ambulatory Visit (INDEPENDENT_AMBULATORY_CARE_PROVIDER_SITE_OTHER): Payer: Medicaid Other

## 2023-03-23 VITALS — BP 114/78 | HR 78 | Temp 97.5°F | Resp 18 | Ht 62.0 in | Wt 173.4 lb

## 2023-03-23 DIAGNOSIS — Z3A29 29 weeks gestation of pregnancy: Secondary | ICD-10-CM

## 2023-03-23 DIAGNOSIS — R1110 Vomiting, unspecified: Secondary | ICD-10-CM

## 2023-03-23 DIAGNOSIS — O99013 Anemia complicating pregnancy, third trimester: Secondary | ICD-10-CM | POA: Diagnosis not present

## 2023-03-23 DIAGNOSIS — D508 Other iron deficiency anemias: Secondary | ICD-10-CM

## 2023-03-23 MED ORDER — LACTATED RINGERS IV SOLN
Freq: Once | INTRAVENOUS | Status: AC
  Filled 2023-03-23: qty 10

## 2023-03-23 MED ORDER — DIPHENHYDRAMINE HCL 25 MG PO CAPS
25.0000 mg | ORAL_CAPSULE | Freq: Once | ORAL | Status: AC
  Administered 2023-03-23: 25 mg via ORAL
  Filled 2023-03-23: qty 1

## 2023-03-23 MED ORDER — IRON SUCROSE 20 MG/ML IV SOLN
200.0000 mg | Freq: Once | INTRAVENOUS | Status: AC
  Administered 2023-03-23: 200 mg via INTRAVENOUS
  Filled 2023-03-23: qty 10

## 2023-03-23 MED ORDER — ACETAMINOPHEN 325 MG PO TABS
650.0000 mg | ORAL_TABLET | Freq: Once | ORAL | Status: AC
  Administered 2023-03-23: 650 mg via ORAL
  Filled 2023-03-23: qty 2

## 2023-03-23 MED ORDER — SODIUM CHLORIDE 0.9 % IV SOLN
25.0000 mg | INTRAVENOUS | Status: DC
  Administered 2023-03-23: 25 mg via INTRAVENOUS
  Filled 2023-03-23: qty 1

## 2023-03-23 NOTE — Progress Notes (Signed)
 Diagnosis: Iron Deficiency Anemia  Provider:  Chilton Greathouse MD  Procedure: IV Push  IV Type: Peripheral, IV Location: L Antecubital  Venofer (Iron Sucrose), Dose: 200 mg  Post Infusion IV Care: Observation period completed and Peripheral IV Discontinued  Discharge: Condition: Good, Destination: Home . AVS Declined  Performed by:  Loney Hering, LPN   Diagnosis: Hyperemeis   Provider:  Chilton Greathouse MD  Procedure: IV Infusion  IV Type: Peripheral, IV Location: L Antecubital  Banana Bag, Dose: 1000 ml  Infusion Start Time: 1436  Infusion Stop Time: 1544   Phenergan with Normal Saline, Dose: 25mg  in   Infusion Start Time: 1545  Infusion Stop Time: 1654  Post Infusion IV Care: Peripheral IV Discontinued  Discharge: Condition: Good, Destination: Home . AVS Declined  Performed by:  Loney Hering, LPN

## 2023-03-24 ENCOUNTER — Encounter: Payer: Self-pay | Admitting: *Deleted

## 2023-03-24 ENCOUNTER — Encounter: Payer: Medicaid Other | Admitting: Physical Therapy

## 2023-03-24 ENCOUNTER — Ambulatory Visit (HOSPITAL_BASED_OUTPATIENT_CLINIC_OR_DEPARTMENT_OTHER): Payer: Medicaid Other | Admitting: Physical Therapy

## 2023-03-24 ENCOUNTER — Other Ambulatory Visit: Payer: Self-pay

## 2023-03-24 ENCOUNTER — Ambulatory Visit: Payer: Medicaid Other

## 2023-03-24 ENCOUNTER — Ambulatory Visit: Payer: Medicaid Other | Admitting: *Deleted

## 2023-03-24 VITALS — BP 125/79 | HR 95

## 2023-03-24 DIAGNOSIS — M6281 Muscle weakness (generalized): Secondary | ICD-10-CM

## 2023-03-24 DIAGNOSIS — Z8759 Personal history of other complications of pregnancy, childbirth and the puerperium: Secondary | ICD-10-CM

## 2023-03-24 DIAGNOSIS — Z362 Encounter for other antenatal screening follow-up: Secondary | ICD-10-CM | POA: Insufficient documentation

## 2023-03-24 DIAGNOSIS — R1110 Vomiting, unspecified: Secondary | ICD-10-CM | POA: Insufficient documentation

## 2023-03-24 DIAGNOSIS — O0992 Supervision of high risk pregnancy, unspecified, second trimester: Secondary | ICD-10-CM | POA: Insufficient documentation

## 2023-03-24 DIAGNOSIS — O402XX1 Polyhydramnios, second trimester, fetus 1: Secondary | ICD-10-CM | POA: Diagnosis not present

## 2023-03-24 NOTE — Progress Notes (Signed)
   PRENATAL VISIT NOTE  Subjective:  Heather Malone is a 33 y.o. [id number] at [redacted]w[redacted]d being seen today for ongoing prenatal care.  She is currently monitored for the following issues for this high-risk pregnancy and has Chronic headaches; Alpha thalassemia silent carrier; Anemia in pregnancy; Elevated blood pressure complicating pregnancy in first trimester, antepartum; Supervision of high-risk pregnancy; History of severe pre-eclampsia; Hyperemesis; Abdominal pain affecting pregnancy; Abdominal pain in pregnancy, second trimester; Migraine without aura and without status migrainosus, not intractable; Rebound headache; and Pregnancy headache in third trimester on their problem list.  Patient reports  doing better this week, has been to aquatic therapy which helps the most .  Contractions: Not present. Vag. Bleeding: None.  Movement: Present. Denies leaking of fluid.   The following portions of the patient's history were reviewed and updated as appropriate: allergies, current medications, past family history, past medical history, past social history, past surgical history and problem list.   Objective:   Vitals:   03/25/23 1205  BP: 125/82  Pulse: 96  Weight: 175 lb 3.2 oz (79.5 kg)    Fetal Status: Fetal Heart Rate (bpm): 143 Fundal Height: 29 cm Movement: Present  Presentation: Vertex  General:  Alert, oriented and cooperative. Patient is in no acute distress.  Skin: Skin is warm and dry. No rash noted.   Cardiovascular: Normal heart rate noted  Respiratory: Normal respiratory effort, no problems with respiration noted  Abdomen: Soft, gravid, appropriate for gestational age.  Pain/Pressure: Present     Pelvic: Cervical exam deferred        Extremities: Normal range of motion.  Edema: None  Mental Status: Normal mood and affect. Normal behavior. Normal judgment and thought content.   Assessment and Plan:  Pregnancy: G3P1102 at [redacted]w[redacted]d 1. Supervision of high risk pregnancy in third  trimester (Primary) - Doing well, feeling regular and vigorous fetal movement   2. [redacted] weeks gestation of pregnancy - Routine OB care, passed her GTT  3. Hyperemesis - Well-controlled with weekly infusions  4. Abdominal pain affecting pregnancy - Much better, still using a Leone Mobley but aquatic therapy is helping  5. Anemia during pregnancy in third trimester - Getting IV iron  6. Chronic tension-type headache, not intractable - Better since she saw the headache NP  Preterm labor symptoms and general obstetric precautions including but not limited to vaginal bleeding, contractions, leaking of fluid and fetal movement were reviewed in detail with the patient. Please refer to After Visit Summary for other counseling recommendations.   Return in about 2 weeks (around 04/08/2023) for IN-PERSON, HOB.  Future Appointments  Date Time Provider Department Center  03/30/2023 11:45 AM Salvadore Oxford [facility]  04/01/2023 11:45 AM Carlson-Long, Lise Auer [facility]  04/01/2023  2:00 PM [facility] 1 [facility] None  04/07/2023  2:45 PM Carlson-Long, Lise Auer [facility]  04/08/2023 11:15 AM Bernerd Limbo, CNM [facility]  04/10/2023  1:45 PM Carlson-Long, Lise Auer [facility]  04/22/2023  9:35 AM Bernerd Limbo, CNM [facility]  04/22/2023 11:15 AM [facility] NURSE [facility]  04/22/2023 11:30 AM [facility] US5 [facility]  05/06/2023 11:15 AM Bernerd Limbo, CNM [facility]  05/13/2023 11:15 AM Bernerd Limbo, CNM [facility]  05/20/2023 11:15 AM Bernerd Limbo, CNM [facility]  05/27/2023 11:15 AM Bernerd Limbo, CNM [facility]    Bernerd Limbo, CNM

## 2023-03-24 NOTE — Therapy (Signed)
 OUTPATIENT PHYSICAL THERAPY FEMALE PELVIC TREATMENT   Patient Name: Heather Malone MRN: [id number] DOB:December 27, 1990, 33 y.o., female Today's Date: 03/24/2023  END OF SESSION:  PT End of Session - 03/24/23 1117     Visit Number 5    Authorization Type [employer]    Authorization Time Period no auth required    PT Start Time 1020    PT Stop Time 1058    PT Time Calculation (min) 38 min    Activity Tolerance Patient tolerated treatment well    Behavior During Therapy WFL for tasks assessed/performed                 Past Medical History:  Diagnosis Date   Anemia    Anxiety    Asthma    albuterol prn   Headache    migraines   Hx of trichomoniasis    Hx of varicella    Neck injury, subsequent encounter 07/26/2016   Placental abruption in third trimester 09/04/2014   Pre-eclampsia    Pregnancy induced hypertension    Subchorionic hemorrhage in third trimester 07/26/2014   Velamentous insertion of umbilical cord    Past Surgical History:  Procedure Laterality Date   TONSILLECTOMY     Patient Active Problem List   Diagnosis Date Noted   Migraine without aura and without status migrainosus, not intractable 03/06/2023   Rebound headache 03/06/2023   Pregnancy headache in second trimester 03/06/2023   Abdominal pain in pregnancy, second trimester 02/12/2023   Abdominal pain affecting pregnancy 02/11/2023   Hyperemesis 12/10/2022   Supervision of high-risk pregnancy 10/29/2022   History of severe pre-eclampsia 10/29/2022   Elevated blood pressure complicating pregnancy in first trimester, antepartum 10/01/2022   Anemia in pregnancy 08/05/2020   Alpha thalassemia silent carrier 08/02/2020   Chronic headaches 04/12/2020    PCP: Patient, No Pcp Per PCP - General  REFERRING PROVIDER: Reva Bores, MD Ref Provider  REFERRING DIAG: R10.30 (ICD-10-CM) - Lower abdominal pain    THERAPY DIAG:  Muscle weakness (generalized)  Rationale for Evaluation and  Treatment: Rehabilitation  ONSET DATE: 10/2022  SUBJECTIVE:                                                                                                                                                                                           SUBJECTIVE STATEMENT: Pt reports she had a few hours of relief of symptoms after last session.  Has not gotten to a pool outside of therapy.  She states that she has been trying to put more weight into R foot, with use of cane. She has been on bed rest  since 1/21.    POOL ACCESS: boyfriend has membership to [facility], she is not member.     Boyfriend present on deck for beginning portion of treatment.   PAIN:  Are you having pain? Yes NPRS scale: 5/10 Pain location:  abdomen- like lightning bolts, low back radiating down RLE to R toes  Pain type: burning  Pain description: intermittent and constant   Aggravating factors: sitting too long, walking too much Relieving factors: ice packs on backs, bending down  PRECAUTIONS: None  RED FLAGS: None   WEIGHT BEARING RESTRICTIONS: No  FALLS:  Has patient fallen in last 6 months? No  LIVING ENVIRONMENT: Lives with: lives with their family Lives in: House/apartment Stairs: No Has following equipment at home: None  OCCUPATION: desk job, benefits specialist  PLOF: Independent  PATIENT GOALS: relieve the pain  PERTINENT HISTORY:  Nothing relevant Sexual abuse: No  BOWEL MOVEMENT:n/a   URINATION: no issues  INTERCOURSE: no issues  PREGNANCY: Vaginal deliveries 2 Tearing No C-section deliveries 0 Currently pregnant Yes: 19 weeks  PROLAPSE: None   OBJECTIVE:  Note: Objective measures were completed at Evaluation unless otherwise noted.     COGNITION: Overall cognitive status: Within functional limits for tasks assessed     SENSATION: Light touch: Appears intact Proprioception: Appears intact  MUSCLE LENGTH: Hamstrings: Right 80 deg; Left 80 deg   LUMBAR SPECIAL  TESTS:  Stork standing: Positive for weakness bilat    GAIT: Comments: antalgic, wide stance  POSTURE: rounded shoulders, forward head, increased lumbar lordosis, and anterior pelvic tilt  PELVIC ALIGNMENT: seems even  LUMBARAROM/PROM:  A/PROM A/PROM  eval  Flexion 50% difficult  Extension 50% difficult   LOWER EXTREMITY ROM: some limitations throughout d/t stiffness and pain  LOWER EXTREMITY MMT: at least 4/5 grossly overall    PALPATION:   General  abdomen- low tone                External Perineal Exam to be assessed as needed                             Internal Pelvic Floor to be assessed as needed  Patient confirms identification and approves PT to assess internal pelvic floor and treatment No  PELVIC MMT:   MMT eval  Vaginal   Internal Anal Sphincter   External Anal Sphincter   Puborectalis   Diastasis Recti   (Blank rows = not tested)        TONE: Low tone abdomen High tone and trigger points lumbar paraspinals Pelvic girdle external pressure relieved pain ( serola belt trial, manual pressure)  PROLAPSE: To be assessed if needed  TODAY'S TREATMENT:                                                                                                                              [facility] Adult PT Treatment:  DATE: 03/24/23 Pt seen for aquatic therapy today.  Treatment took place in water 3.5-4.75 ft in depth at the [facility] pool. Temp of water was 91.  Pt entered/exited the pool via stairs in step-to pattern with bilat rail. ** pt transported to/from lobby in [facility] UE on barbell in 4+ ft of water:  * Walking forward/ backward, 3 laps with RLE on down slope (facing hot tub)- cues for heel/toe and toe/ heel  * side stepping R/L 2 laps - (side stepping L increased R foot pain) * marching backward/forward with UE on barbell * seated on noodle like swing with feet touching ground, pelvic tilts in all 4  directions * seated on lift chair:  LAQ with DF for nerve flossing and calf stretch; cycling LEs; hip abdct/ addct  * unsupported - side stepping, walking forward/ backward * UE on wall:  R hip ext to touch 2 x 10, L x 10;  fig 4 stretch   Pt requires the buoyancy and hydrostatic pressure of water for support, and to offload joints by unweighting joint load by at least 50 % in navel deep water and by at least 75-80% in chest to neck deep water.  Viscosity of the water is needed for resistance of strengthening. Water current perturbations provides challenge to standing balance requiring increased core activation.  ** pt transported back to lobby in [facility]; encouraged to utilize transportation chair with boyfriend's assist to get to/from pool area.   [facility] Adult PT Treatment:                                                DATE: 03/18/23 Pt seen for aquatic therapy today.  Treatment took place in water 3.5-4.75 ft in depth at the [facility] pool. Temp of water was 91.  Pt entered/exited the pool via stairs in step-to pattern with bilat rail.  UE on barbell in 4+ ft of water:  * Walking forward/ backward, multiple laps with RLE on down slope (facing hot tub)- cues for heel/toe and toe/ heel  * side stepping R/L 2 laps * marching backward/forward * unsupported - walking forward/ backward * UE on wall: hip abdct/ addct x 5; R hip ext to touch x 10 * supported by yellow noodle wrapped anteriorly, LE suspended - cycling (increased pain in RLE immediately) hip abdct/ addct (continued pain and return of radicular symptoms)  * return to walking backwards and forwards with barbell   Pt requires the buoyancy and hydrostatic pressure of water for support, and to offload joints by unweighting joint load by at least 50 % in navel deep water and by at least 75-80% in chest to neck deep water.  Viscosity of the water is needed for resistance of strengthening. Water current perturbations provides challenge to  standing balance requiring increased core activation.  ** pt transported back to lobby in [facility]; encouraged to utilize transportation chair with boyfriend's assist to get to/from pool area.   DATE: 02/20/23  Manual- STM lumbar paraspinals, QL  and right glutes  Therapeutic activities- trial of kinesiotaping- 4 strips herringbone pattern on abdomen Trial of sheet instead of belt- not tol well on abdomen                      HOME EXERCISE PROGRAM: [id number]  ASSESSMENT:  CLINICAL IMPRESSION: Pt reported increase  in RLE pain when LE suspended on noodle (seated like swing); improved with feet touching ground.  She tolerated seated exercise in lift chair, but symptoms in RLE worsened when returning to walking in chest deep water.  Overall pain level reduced by 1-2 points. She reported increased fatigue at end of session.  Gait normalized when submerged 70% and with cues given. Encouraged pt to visit local pool outside therapy sessions for continued relief of symptoms and improvement of gait quality.   Goals are ongoing.      OBJECTIVE IMPAIRMENTS: decreased activity tolerance, decreased knowledge of condition, difficulty walking, decreased strength, increased muscle spasms, impaired flexibility, impaired tone, improper body mechanics, and pain.   ACTIVITY LIMITATIONS: carrying, lifting, bending, standing, squatting, sleeping, stairs, transfers, bed mobility, locomotion level, and caring for others  PARTICIPATION LIMITATIONS: meal prep, cleaning, laundry, shopping, community activity, occupation, and yard work  PERSONAL FACTORS: Fitness and Time since onset of injury/illness/exacerbation are also affecting patient's functional outcome.   REHAB POTENTIAL: Good  CLINICAL DECISION MAKING: Stable/uncomplicated  EVALUATION COMPLEXITY: Low   GOALS: Goals reviewed with patient? Yes  SHORT TERM GOALS: Target date: 02/06/2023    Pt will be I with her HEP and demonstrate her exercises  correctly Baseline: Goal status: INITIAL  2.  Pt will report max 4/10 pain with walking for 30 mins Baseline:  Goal status: INITIAL  3.  Pt will be able to lift her 2 yo child with good pressure management at least 50% of the time Baseline:  Goal status: INITIAL   LONG TERM GOALS: Target date: 04/03/2023    Pt will report max 2/10 low back and abdominal pain with 1 hour of walking  Baseline:  Goal status: INITIAL  2.  Pt will be I with advanced HEP and dem all exercises correctly Baseline:  Goal status: INITIAL  3.  Pt will be able to sleep at least 5 consecutive hours at night without increased pain Baseline:  Goal status: INITIAL   PLAN:  PT FREQUENCY: 1-2x/week  PT DURATION: 12 weeks  PLANNED INTERVENTIONS: 97110-Therapeutic exercises, 97530- Therapeutic activity, 97112- Neuromuscular re-education, 97535- Self Care, 86578- Manual therapy, [phone]- Electrical stimulation (manual), Patient/Family education, Taping, Dry Needling, Joint mobilization, Joint manipulation, Spinal manipulation, Spinal mobilization, Moist heat, and Biofeedback  PLAN FOR NEXT SESSION: continue exercises, (land appt) teach pt kinesio taping for abdomen; assess response to aquatic therapy   Mayer Camel, PTA 03/24/23 11:18 AM [facility] [address] [place], [place], [address] Phone: [phone]   Fax:  [phone]

## 2023-03-25 ENCOUNTER — Other Ambulatory Visit: Payer: Self-pay

## 2023-03-25 ENCOUNTER — Ambulatory Visit (INDEPENDENT_AMBULATORY_CARE_PROVIDER_SITE_OTHER): Payer: Medicaid Other | Admitting: Certified Nurse Midwife

## 2023-03-25 ENCOUNTER — Ambulatory Visit: Payer: Medicaid Other

## 2023-03-25 VITALS — BP 125/82 | HR 96 | Wt 175.2 lb

## 2023-03-25 DIAGNOSIS — Z8759 Personal history of other complications of pregnancy, childbirth and the puerperium: Secondary | ICD-10-CM

## 2023-03-25 DIAGNOSIS — R109 Unspecified abdominal pain: Secondary | ICD-10-CM

## 2023-03-25 DIAGNOSIS — O26893 Other specified pregnancy related conditions, third trimester: Secondary | ICD-10-CM | POA: Diagnosis not present

## 2023-03-25 DIAGNOSIS — R1110 Vomiting, unspecified: Secondary | ICD-10-CM

## 2023-03-25 DIAGNOSIS — O26899 Other specified pregnancy related conditions, unspecified trimester: Secondary | ICD-10-CM

## 2023-03-25 DIAGNOSIS — Z3A29 29 weeks gestation of pregnancy: Secondary | ICD-10-CM | POA: Diagnosis not present

## 2023-03-25 DIAGNOSIS — O09893 Supervision of other high risk pregnancies, third trimester: Secondary | ICD-10-CM

## 2023-03-25 DIAGNOSIS — O99013 Anemia complicating pregnancy, third trimester: Secondary | ICD-10-CM

## 2023-03-25 DIAGNOSIS — G44229 Chronic tension-type headache, not intractable: Secondary | ICD-10-CM

## 2023-03-25 DIAGNOSIS — O0993 Supervision of high risk pregnancy, unspecified, third trimester: Secondary | ICD-10-CM | POA: Diagnosis not present

## 2023-03-25 DIAGNOSIS — O403XX0 Polyhydramnios, third trimester, not applicable or unspecified: Secondary | ICD-10-CM

## 2023-03-27 ENCOUNTER — Encounter: Payer: Self-pay | Admitting: Physician Assistant

## 2023-03-27 ENCOUNTER — Ambulatory Visit: Payer: Medicaid Other | Admitting: Physician Assistant

## 2023-03-27 VITALS — BP 117/74 | HR 91 | Wt 176.6 lb

## 2023-03-27 DIAGNOSIS — Z3A29 29 weeks gestation of pregnancy: Secondary | ICD-10-CM

## 2023-03-27 DIAGNOSIS — G43009 Migraine without aura, not intractable, without status migrainosus: Secondary | ICD-10-CM | POA: Diagnosis not present

## 2023-03-27 DIAGNOSIS — R519 Headache, unspecified: Secondary | ICD-10-CM | POA: Diagnosis not present

## 2023-03-27 DIAGNOSIS — O26893 Other specified pregnancy related conditions, third trimester: Secondary | ICD-10-CM | POA: Diagnosis not present

## 2023-03-27 DIAGNOSIS — G4440 Drug-induced headache, not elsewhere classified, not intractable: Secondary | ICD-10-CM | POA: Diagnosis not present

## 2023-03-27 NOTE — Progress Notes (Signed)
 Follow up Headaches   History:  Heather Malone is a 33 y.o. [id number] who presents to clinic today for headache followup.  She has not taken tylenol at all since she was last seen 3 weeks ago.  She feels like she has reduced number of headaches and reduced severity.  She is recognizing that the overuse of tylenol was making the problem worse, rather than better.   The phenergan suppository was helpful.  She has not needed fioricet.  She feels like she is at a good place now regarding headaches.  She has medications and knows how to use them.   Regarding the pregnancy, she is planning for a water birth and feels good about that.  She notes her baby is "big" and she has excess of amniotic fluid but she reports she knows things are looking good and she is watched carefully.   She does have a red, raised bump on the outer corner of her right lip.  She believes it is acne and is unconcerned.      Number of days in the last 4 weeks with:  Severe headache: 4 Moderate headache: 10 Mild headache: 10  No headache: 4   Past Medical History:  Diagnosis Date   Anemia    Anxiety    Asthma    albuterol prn   Headache    migraines   Hx of trichomoniasis    Hx of varicella    Neck injury, subsequent encounter 07/26/2016   Placental abruption in third trimester 09/04/2014   Pre-eclampsia    Pregnancy induced hypertension    Subchorionic hemorrhage in third trimester 07/26/2014   Velamentous insertion of umbilical cord     Social History   Socioeconomic History   Marital status: Single    Spouse name: Not on file   Number of children: Not on file   Years of education: Not on file   Highest education level: Not on file  Occupational History   Not on file  Tobacco Use   Smoking status: Former    Types: Cigars   Smokeless tobacco: Never  Vaping Use   Vaping status: Never Used  Substance and Sexual Activity   Alcohol use: Not Currently    Comment: weekends   Drug use: Not Currently     Types: Marijuana    Comment: none with + UPT   Sexual activity: Yes    Birth control/protection: None  Other Topics Concern   Not on file  Social History Narrative   Not on file   Social Drivers of Health   Financial Resource Strain: Not on file  Food Insecurity: No Food Insecurity (02/13/2023)   Hunger Vital Sign    Worried About Running Out of Food in the Last Year: Never true    Ran Out of Food in the Last Year: Never true  Transportation Needs: No Transportation Needs (02/13/2023)   PRAPARE - Administrator, Civil Service (Medical): No    Lack of Transportation (Non-Medical): No  Physical Activity: Not on file  Stress: Not on file  Social Connections: Not on file  Intimate Partner Violence: Not At Risk (02/13/2023)   Humiliation, Afraid, Rape, and Kick questionnaire    Fear of Current or Ex-Partner: No    Emotionally Abused: No    Physically Abused: No    Sexually Abused: No    Family History  Problem Relation Age of Onset   Hypertension Mother    Hypertension Maternal Grandmother  Heart attack Maternal Grandmother    Hypertension Maternal Grandfather    Cancer Maternal Grandfather    Diabetes Paternal Grandmother     No Known Allergies  Current Outpatient Medications on File Prior to Visit  Medication Sig Dispense Refill   amoxicillin (AMOXIL) 500 MG tablet Take 500 mg by mouth 3 (three) times daily.     Butalbital-APAP-Caffeine 50-325-40 MG capsule Take 1-2 capsules by mouth every 6 (six) hours as needed for headache. 30 capsule 1   cyclobenzaprine (FLEXERIL) 10 MG tablet Take 1 tablet (10 mg total) by mouth every 8 (eight) hours as needed for muscle spasms. 60 tablet 1   Doxylamine-Pyridoxine ER (BONJESTA) 20-20 MG TBCR Take 1 tablet by mouth at bedtime. 60 tablet 3   gabapentin (NEURONTIN) 300 MG capsule Take 1 capsule (300 mg total) by mouth 3 (three) times daily as needed. 60 capsule 1   omeprazole (PRILOSEC) 40 MG capsule Take 1 capsule (40 mg  total) by mouth daily. 30 capsule 4   Prenatal Vit-Fe Fumarate-FA (MULTIVITAMIN-PRENATAL) 27-0.8 MG TABS tablet Take 1 tablet by mouth daily at 12 noon. 30 tablet 0   promethazine (PHENERGAN) 12.5 MG suppository Place 1 suppository (12.5 mg total) rectally every 6 (six) hours as needed for nausea or vomiting. (Patient not taking: Reported on 03/27/2023) 12 each 0   No current facility-administered medications on file prior to visit.     Review of Systems:  All pertinent positive/negative included in HPI, all other review of systems are negative   Objective:  Physical Exam BP 117/74   Pulse 91   Wt 176 lb 9.6 oz (80.1 kg)   LMP 08/18/2022 (Exact Date)   BMI 32.30 kg/m  CONSTITUTIONAL: Well-developed, well-nourished female in no acute distress.  EYES: EOM intact ENT: Normocephalic - raised erythematous papule approx 5mm diameter, no crust, no discharge, nontender CARDIOVASCULAR: Regular rate RESPIRATORY: Normal rate.  MUSCULOSKELETAL: Normal ROM SKIN: Warm, dry without erythema  NEUROLOGICAL: Alert, oriented, CN II-XII grossly intact, Using walker to ambulate   PSYCH: Normal behavior, mood   Assessment & Plan:  Assessment: 1. Migraine without aura and without status migrainosus, not intractable   2. Rebound headache   3. Pregnancy headache in third trimester      Plan: Improved headaches with elimination of tylenol.  Continue limiting its use.   Watch bump on lip and reach out if changes.  Flexeril - first line for HA.  Fioricet if flexeril not helpful Phenergan suppository for HA rescue if needed.  Follow-up PRN  26 minutes spent in direct patient care this encounter  Bertram Denver, PA-C 03/27/2023 8:12 AM

## 2023-03-28 DIAGNOSIS — A749 Chlamydial infection, unspecified: Secondary | ICD-10-CM

## 2023-03-28 HISTORY — DX: Chlamydial infection, unspecified: A74.9

## 2023-03-30 ENCOUNTER — Encounter (HOSPITAL_BASED_OUTPATIENT_CLINIC_OR_DEPARTMENT_OTHER): Payer: Self-pay | Admitting: Physical Therapy

## 2023-03-30 ENCOUNTER — Ambulatory Visit: Payer: Medicaid Other

## 2023-03-30 ENCOUNTER — Ambulatory Visit (HOSPITAL_BASED_OUTPATIENT_CLINIC_OR_DEPARTMENT_OTHER): Payer: Medicaid Other | Attending: Obstetrics and Gynecology | Admitting: Physical Therapy

## 2023-03-30 DIAGNOSIS — M6281 Muscle weakness (generalized): Secondary | ICD-10-CM | POA: Diagnosis present

## 2023-03-30 NOTE — Therapy (Signed)
 OUTPATIENT PHYSICAL THERAPY FEMALE PELVIC TREATMENT   Patient Name: Heather Malone MRN: [id number] DOB:February 24, 1990, 33 y.o., female Today's Date: 03/30/2023  END OF SESSION:  PT End of Session - 03/30/23 1155     Visit Number 6    Authorization Type [employer] Medicaid    Authorization Time Period no auth required    PT Start Time 1145    PT Stop Time 1225    PT Time Calculation (min) 40 min    Activity Tolerance Patient tolerated treatment well    Behavior During Therapy WFL for tasks assessed/performed                 Past Medical History:  Diagnosis Date   Anemia    Anxiety    Asthma    albuterol prn   Headache    migraines   Hx of trichomoniasis    Hx of varicella    Neck injury, subsequent encounter 07/26/2016   Placental abruption in third trimester 09/04/2014   Pre-eclampsia    Pregnancy induced hypertension    Subchorionic hemorrhage in third trimester 07/26/2014   Velamentous insertion of umbilical cord    Past Surgical History:  Procedure Laterality Date   TONSILLECTOMY     Patient Active Problem List   Diagnosis Date Noted   Migraine without aura and without status migrainosus, not intractable 03/06/2023   Rebound headache 03/06/2023   Pregnancy headache in third trimester 03/06/2023   Abdominal pain in pregnancy, second trimester 02/12/2023   Abdominal pain affecting pregnancy 02/11/2023   Hyperemesis 12/10/2022   Supervision of high-risk pregnancy 10/29/2022   History of severe pre-eclampsia 10/29/2022   Elevated blood pressure complicating pregnancy in first trimester, antepartum 10/01/2022   Anemia in pregnancy 08/05/2020   Alpha thalassemia silent carrier 08/02/2020   Chronic headaches 04/12/2020    PCP: Patient, No Pcp Per PCP - General  REFERRING PROVIDER: Reva Bores, MD Ref Provider  REFERRING DIAG: R10.30 (ICD-10-CM) - Lower abdominal pain    THERAPY DIAG:  Muscle weakness (generalized)  Rationale for Evaluation and  Treatment: Rehabilitation  ONSET DATE: 10/2022  SUBJECTIVE:                                                                                                                                                                                           SUBJECTIVE STATEMENT: Pt reports she is sore from photo shoot yesterday. She did go to a pool outside on Saturday and did similar exercises with good relief.  She has been on bed rest since 1/21.    POOL ACCESS: boyfriend has membership to [facility], she is not member.  Boyfriend present on deck for beginning portion of treatment.   PAIN:  Are you having pain? Yes NPRS scale: 6/10 Pain location:  abdomen- like lightning bolts, low back radiating down RLE to R toes  Pain type: burning  Pain description: intermittent and constant   Aggravating factors: sitting too long, walking too much Relieving factors: ice packs on backs, bending down  PRECAUTIONS: None  RED FLAGS: None   WEIGHT BEARING RESTRICTIONS: No  FALLS:  Has patient fallen in last 6 months? No  LIVING ENVIRONMENT: Lives with: lives with their family Lives in: House/apartment Stairs: No Has following equipment at home: None  OCCUPATION: desk job, benefits specialist  PLOF: Independent  PATIENT GOALS: relieve the pain  PERTINENT HISTORY:  Nothing relevant Sexual abuse: No  BOWEL MOVEMENT:n/a   URINATION: no issues  INTERCOURSE: no issues  PREGNANCY: Vaginal deliveries 2 Tearing No C-section deliveries 0 Currently pregnant Yes: 19 weeks  PROLAPSE: None   OBJECTIVE:  Note: Objective measures were completed at Evaluation unless otherwise noted.     COGNITION: Overall cognitive status: Within functional limits for tasks assessed     SENSATION: Light touch: Appears intact Proprioception: Appears intact  MUSCLE LENGTH: Hamstrings: Right 80 deg; Left 80 deg   LUMBAR SPECIAL TESTS:  Stork standing: Positive for weakness  bilat    GAIT: Comments: antalgic, wide stance  POSTURE: rounded shoulders, forward head, increased lumbar lordosis, and anterior pelvic tilt  PELVIC ALIGNMENT: seems even  LUMBARAROM/PROM:  A/PROM A/PROM  eval  Flexion 50% difficult  Extension 50% difficult   LOWER EXTREMITY ROM: some limitations throughout d/t stiffness and pain  LOWER EXTREMITY MMT: at least 4/5 grossly overall    PALPATION:   General  abdomen- low tone                External Perineal Exam to be assessed as needed                             Internal Pelvic Floor to be assessed as needed  Patient confirms identification and approves PT to assess internal pelvic floor and treatment No  PELVIC MMT:   MMT eval  Vaginal   Internal Anal Sphincter   External Anal Sphincter   Puborectalis   Diastasis Recti   (Blank rows = not tested)        TONE: Low tone abdomen High tone and trigger points lumbar paraspinals Pelvic girdle external pressure relieved pain ( serola belt trial, manual pressure)  PROLAPSE: To be assessed if needed  TODAY'S TREATMENT:                                                                                                                              [facility] Adult PT Treatment:  DATE: 03/30/23 Pt seen for aquatic therapy today.  Treatment took place in water 3.5-4.75 ft in depth at the [facility] pool. Temp of water was 91.  Pt entered/exited the pool via stairs in step-to pattern with bilat rail. ** pt transported to/from lobby in [facility]  UE on barbell in 4+ ft of water:  * Walking forward/ backward UE on yellow hand floats, 3 laps with RLE on down slope (facing hot tub)- cues for heel/toe and toe/ heel  * side stepping R/L 2 laps  * UE on wall:  hip ext 2 x 5 each; hip abdct/ addct 2 x5 each; heel raises x 10;  * marching backward/forward with reciprocal row with UE on yellow hand floats * UE On wall: hip circles (RLE  backwards, forward not tolerated) x 5 each LE * UE on yellow hand floats: march backwards, small walking kicks forward x 2 laps  * holding wall: fig 4 stretch for RLE x 45s   ** pt transported back to lobby in [facility]; encouraged to utilize transportation chair with boyfriend's assist to get to/from pool area.   [facility] Adult PT Treatment:                                                DATE: 03/24/23 Pt seen for aquatic therapy today.  Treatment took place in water 3.5-4.75 ft in depth at the [facility] pool. Temp of water was 91.  Pt entered/exited the pool via stairs in step-to pattern with bilat rail. ** pt transported to/from lobby in [facility] UE on barbell in 4+ ft of water:  * Walking forward/ backward, 3 laps with RLE on down slope (facing hot tub)- cues for heel/toe and toe/ heel  * side stepping R/L 2 laps - (side stepping L increased R foot pain) * marching backward/forward with UE on barbell * seated on noodle like swing with feet touching ground, pelvic tilts in all 4 directions * seated on lift chair:  LAQ with DF for nerve flossing and calf stretch; cycling LEs; hip abdct/ addct  * unsupported - side stepping, walking forward/ backward * UE on wall:  R hip ext to touch 2 x 10, L x 10;  fig 4 stretch   ** pt transported back to lobby in [facility]; encouraged to utilize transportation chair with boyfriend's assist to get to/from pool area.   [facility] Adult PT Treatment:                                                DATE: 03/18/23 Pt seen for aquatic therapy today.  Treatment took place in water 3.5-4.75 ft in depth at the [facility] pool. Temp of water was 91.  Pt entered/exited the pool via stairs in step-to pattern with bilat rail.  UE on barbell in 4+ ft of water:  * Walking forward/ backward, multiple laps with RLE on down slope (facing hot tub)- cues for heel/toe and toe/ heel  * side stepping R/L 2 laps * marching backward/forward * unsupported - walking forward/ backward *  UE on wall: hip abdct/ addct x 5; R hip ext to touch x 10 * supported by yellow noodle wrapped anteriorly, LE suspended - cycling (increased pain  in RLE immediately) hip abdct/ addct (continued pain and return of radicular symptoms)  * return to walking backwards and forwards with barbell   Pt requires the buoyancy and hydrostatic pressure of water for support, and to offload joints by unweighting joint load by at least 50 % in navel deep water and by at least 75-80% in chest to neck deep water.  Viscosity of the water is needed for resistance of strengthening. Water current perturbations provides challenge to standing balance requiring increased core activation.  ** pt transported back to lobby in [facility]; encouraged to utilize transportation chair with boyfriend's assist to get to/from pool area.   DATE: 02/20/23  Manual- STM lumbar paraspinals, QL  and right glutes  Therapeutic activities- trial of kinesiotaping- 4 strips herringbone pattern on abdomen Trial of sheet instead of belt- not tol well on abdomen                      HOME EXERCISE PROGRAM: [id number]  ASSESSMENT:  CLINICAL IMPRESSION: Pt reported reduction of RLE radicular symptom from foot up to thigh, and pain level reduced by 2 points.  Gait normalized when submerged 70%. Avoided LEs suspended and seated position in water, as this increased radicular symptoms last visit.   Goals are ongoing. POC ending soon.      OBJECTIVE IMPAIRMENTS: decreased activity tolerance, decreased knowledge of condition, difficulty walking, decreased strength, increased muscle spasms, impaired flexibility, impaired tone, improper body mechanics, and pain.   ACTIVITY LIMITATIONS: carrying, lifting, bending, standing, squatting, sleeping, stairs, transfers, bed mobility, locomotion level, and caring for others  PARTICIPATION LIMITATIONS: meal prep, cleaning, laundry, shopping, community activity, occupation, and yard work  PERSONAL FACTORS: Fitness  and Time since onset of injury/illness/exacerbation are also affecting patient's functional outcome.   REHAB POTENTIAL: Good  CLINICAL DECISION MAKING: Stable/uncomplicated  EVALUATION COMPLEXITY: Low   GOALS: Goals reviewed with patient? Yes  SHORT TERM GOALS: Target date: 02/06/2023    Pt will be I with her HEP and demonstrate her exercises correctly Baseline: Goal status: INITIAL  2.  Pt will report max 4/10 pain with walking for 30 mins Baseline:  Goal status: INITIAL  3.  Pt will be able to lift her 2 yo child with good pressure management at least 50% of the time Baseline:  Goal status: INITIAL   LONG TERM GOALS: Target date: 04/03/2023    Pt will report max 2/10 low back and abdominal pain with 1 hour of walking  Baseline:  Goal status: INITIAL  2.  Pt will be I with advanced HEP and dem all exercises correctly Baseline:  Goal status: INITIAL  3.  Pt will be able to sleep at least 5 consecutive hours at night without increased pain Baseline:  Goal status: INITIAL   PLAN:  PT FREQUENCY: 1-2x/week  PT DURATION: 12 weeks  PLANNED INTERVENTIONS: 97110-Therapeutic exercises, 97530- Therapeutic activity, 97112- Neuromuscular re-education, 97535- Self Care, 86578- Manual therapy, [phone]- Electrical stimulation (manual), Patient/Family education, Taping, Dry Needling, Joint mobilization, Joint manipulation, Spinal manipulation, Spinal mobilization, Moist heat, and Biofeedback  PLAN FOR NEXT SESSION: assess goals for recert.   Mayer Camel, PTA 03/30/23 12:40 PM [facility] [address] [place], [place], [address] Phone: [phone]   Fax:  [phone]

## 2023-04-01 ENCOUNTER — Ambulatory Visit (HOSPITAL_BASED_OUTPATIENT_CLINIC_OR_DEPARTMENT_OTHER): Payer: Medicaid Other | Admitting: Physical Therapy

## 2023-04-01 ENCOUNTER — Encounter (HOSPITAL_BASED_OUTPATIENT_CLINIC_OR_DEPARTMENT_OTHER): Payer: Self-pay | Admitting: Physical Therapy

## 2023-04-01 ENCOUNTER — Ambulatory Visit (HOSPITAL_BASED_OUTPATIENT_CLINIC_OR_DEPARTMENT_OTHER): Admitting: Physical Therapy

## 2023-04-01 ENCOUNTER — Ambulatory Visit: Payer: Medicaid Other

## 2023-04-01 VITALS — BP 115/77 | HR 86 | Temp 98.1°F | Resp 18 | Ht 62.0 in | Wt 175.4 lb

## 2023-04-01 DIAGNOSIS — O210 Mild hyperemesis gravidarum: Secondary | ICD-10-CM | POA: Diagnosis not present

## 2023-04-01 DIAGNOSIS — D508 Other iron deficiency anemias: Secondary | ICD-10-CM

## 2023-04-01 DIAGNOSIS — Z3A31 31 weeks gestation of pregnancy: Secondary | ICD-10-CM

## 2023-04-01 DIAGNOSIS — M6281 Muscle weakness (generalized): Secondary | ICD-10-CM

## 2023-04-01 DIAGNOSIS — R1110 Vomiting, unspecified: Secondary | ICD-10-CM

## 2023-04-01 DIAGNOSIS — O99013 Anemia complicating pregnancy, third trimester: Secondary | ICD-10-CM

## 2023-04-01 MED ORDER — ACETAMINOPHEN 325 MG PO TABS
650.0000 mg | ORAL_TABLET | Freq: Once | ORAL | Status: AC
Start: 2023-04-01 — End: 2023-04-01
  Administered 2023-04-01: 650 mg via ORAL
  Filled 2023-04-01: qty 2

## 2023-04-01 MED ORDER — SODIUM CHLORIDE 0.9 % IV SOLN
25.0000 mg | INTRAVENOUS | Status: DC
  Administered 2023-04-01: 25 mg via INTRAVENOUS
  Filled 2023-04-01: qty 1

## 2023-04-01 MED ORDER — IRON SUCROSE 20 MG/ML IV SOLN
200.0000 mg | Freq: Once | INTRAVENOUS | Status: AC
  Administered 2023-04-01: 200 mg via INTRAVENOUS
  Filled 2023-04-01: qty 10

## 2023-04-01 MED ORDER — LACTATED RINGERS IV SOLN
Freq: Once | INTRAVENOUS | Status: AC
  Filled 2023-04-01: qty 10

## 2023-04-01 MED ORDER — DIPHENHYDRAMINE HCL 25 MG PO CAPS
25.0000 mg | ORAL_CAPSULE | Freq: Once | ORAL | Status: AC
  Administered 2023-04-01: 25 mg via ORAL
  Filled 2023-04-01: qty 1

## 2023-04-01 NOTE — Therapy (Signed)
 OUTPATIENT PHYSICAL THERAPY FEMALE PELVIC TREATMENT   Patient Name: Heather Malone MRN: [id number] DOB:1990-06-08, 33 y.o., female Today's Date: 04/01/2023  END OF SESSION:  PT End of Session - 04/01/23 1203     Visit Number 7    Authorization Type [employer] Medicaid    Authorization Time Period no auth required    PT Start Time 1150    PT Stop Time 1230    PT Time Calculation (min) 40 min    Activity Tolerance Patient tolerated treatment well    Behavior During Therapy WFL for tasks assessed/performed                 Past Medical History:  Diagnosis Date   Anemia    Anxiety    Asthma    albuterol prn   Headache    migraines   Hx of trichomoniasis    Hx of varicella    Neck injury, subsequent encounter 07/26/2016   Placental abruption in third trimester 09/04/2014   Pre-eclampsia    Pregnancy induced hypertension    Subchorionic hemorrhage in third trimester 07/26/2014   Velamentous insertion of umbilical cord    Past Surgical History:  Procedure Laterality Date   TONSILLECTOMY     Patient Active Problem List   Diagnosis Date Noted   Migraine without aura and without status migrainosus, not intractable 03/06/2023   Rebound headache 03/06/2023   Pregnancy headache in third trimester 03/06/2023   Abdominal pain in pregnancy, second trimester 02/12/2023   Abdominal pain affecting pregnancy 02/11/2023   Hyperemesis 12/10/2022   Supervision of high-risk pregnancy 10/29/2022   History of severe pre-eclampsia 10/29/2022   Elevated blood pressure complicating pregnancy in first trimester, antepartum 10/01/2022   Anemia in pregnancy 08/05/2020   Alpha thalassemia silent carrier 08/02/2020   Chronic headaches 04/12/2020    PCP: Patient, No Pcp Per PCP - General  REFERRING PROVIDER: Reva Bores, MD Ref Provider  REFERRING DIAG: R10.30 (ICD-10-CM) - Lower abdominal pain    THERAPY DIAG:  Muscle weakness (generalized)  Rationale for Evaluation and  Treatment: Rehabilitation  ONSET DATE: 10/2022  SUBJECTIVE:                                                                                                                                                                                           SUBJECTIVE STATEMENT: Pt reports she had 2 hrs of relief after last pool session.  Pt is [redacted] wks pregnant;  has been on bed rest since 1/21.    POOL ACCESS: boyfriend has membership to [facility], she is not member.     Boyfriend present on deck for  beginning portion of treatment.   PAIN:  Are you having pain? Yes NPRS scale: 5/10- abdomen,  4/10 RLE to mid-thigh Pain location: see above Pain type: dull Pain description: intermittent and constant   Aggravating factors: sitting too long, walking too much Relieving factors: ice packs on backs, bending down  PRECAUTIONS: None  RED FLAGS: None   WEIGHT BEARING RESTRICTIONS: No  FALLS:  Has patient fallen in last 6 months? No  LIVING ENVIRONMENT: Lives with: lives with their family Lives in: House/apartment Stairs: No Has following equipment at home: None  OCCUPATION: desk job, benefits specialist  PLOF: Independent  PATIENT GOALS: relieve the pain  PERTINENT HISTORY:  Nothing relevant Sexual abuse: No  BOWEL MOVEMENT:n/a   URINATION: no issues  INTERCOURSE: no issues  PREGNANCY: Vaginal deliveries 2 Tearing No C-section deliveries 0 Currently pregnant Yes: 19 weeks  PROLAPSE: None   OBJECTIVE:  Note: Objective measures were completed at Evaluation unless otherwise noted.     COGNITION: Overall cognitive status: Within functional limits for tasks assessed     SENSATION: Light touch: Appears intact Proprioception: Appears intact  MUSCLE LENGTH: Hamstrings: Right 80 deg; Left 80 deg   LUMBAR SPECIAL TESTS:  Stork standing: Positive for weakness bilat    GAIT: Comments: antalgic, wide stance  POSTURE: rounded shoulders, forward head, increased lumbar  lordosis, and anterior pelvic tilt  PELVIC ALIGNMENT: seems even  LUMBARAROM/PROM:  A/PROM A/PROM  eval  Flexion 50% difficult  Extension 50% difficult   LOWER EXTREMITY ROM: some limitations throughout d/t stiffness and pain  LOWER EXTREMITY MMT: at least 4/5 grossly overall    PALPATION:   General  abdomen- low tone                External Perineal Exam to be assessed as needed                             Internal Pelvic Floor to be assessed as needed  Patient confirms identification and approves PT to assess internal pelvic floor and treatment No  PELVIC MMT:   MMT eval  Vaginal   Internal Anal Sphincter   External Anal Sphincter   Puborectalis   Diastasis Recti   (Blank rows = not tested)        TONE: Low tone abdomen High tone and trigger points lumbar paraspinals Pelvic girdle external pressure relieved pain ( serola belt trial, manual pressure)  PROLAPSE: To be assessed if needed  TODAY'S TREATMENT:                                                                                                                              [facility] Adult PT Treatment:  DATE: 04/01/23 Pt seen for aquatic therapy today.  Treatment took place in water 3.5-4.75 ft in depth at the [facility] pool. Temp of water was 91.  Pt entered/exited the pool via stairs in step-through pattern with bilat rail. ** pt transported from lobby in [facility]  In 4+ ft of water:  * UE on barbell inWalking forward/ backward UE 3 laps, with RLE on down slope (facing hot tub) * side stepping R/L -> with arm addct/ abdct with rainbow hand floats x 3 laps total * marching backward/forward with reciprocal row with UE on yellow hand floats 2 laps * UE on wall:  hip ext 2 x 5 each; hip abdct/ addct 2 x5 each; heel raises x 10;  * Return to walking forward/ backward with yellow hand floats on surface * tricep push down with 1/2 hollow noodle  at side x 8 each *  ab set with hollow 1/2 noodle pull down to thighs x 10  * walking forward/ backward with reciprocal arm swing   ** pt transported back to lobby in [facility]; encouraged to utilize transportation chair with boyfriend's assist to get to/from pool area.   Last land session: DATE: 02/20/23  Manual- STM lumbar paraspinals, QL  and right glutes  Therapeutic activities- trial of kinesiotaping- 4 strips herringbone pattern on abdomen Trial of sheet instead of belt- not tol well on abdomen                      HOME EXERCISE PROGRAM: [id number]  ASSESSMENT:  CLINICAL IMPRESSION:  Gait normalized when submerged 70%, RLE able to move through toe off without increase in pain. Avoided LEs suspended and seated position in water, as this increased radicular symptoms in previous visit. No change in pain level today while in water, but also arrived with improved pain level, less radicular symptoms.  Goals are ongoing. POC ending soon. Therapist to assess goals next land visit.      OBJECTIVE IMPAIRMENTS: decreased activity tolerance, decreased knowledge of condition, difficulty walking, decreased strength, increased muscle spasms, impaired flexibility, impaired tone, improper body mechanics, and pain.   ACTIVITY LIMITATIONS: carrying, lifting, bending, standing, squatting, sleeping, stairs, transfers, bed mobility, locomotion level, and caring for others  PARTICIPATION LIMITATIONS: meal prep, cleaning, laundry, shopping, community activity, occupation, and yard work  PERSONAL FACTORS: Fitness and Time since onset of injury/illness/exacerbation are also affecting patient's functional outcome.   REHAB POTENTIAL: Good  CLINICAL DECISION MAKING: Stable/uncomplicated  EVALUATION COMPLEXITY: Low   GOALS: Goals reviewed with patient? Yes  SHORT TERM GOALS: Target date: 02/06/2023    Pt will be I with her HEP and demonstrate her exercises correctly Baseline: Goal status: INITIAL  2.  Pt will report max  4/10 pain with walking for 30 mins Baseline:  Goal status: INITIAL  3.  Pt will be able to lift her 2 yo child with good pressure management at least 50% of the time Baseline: unable/ lift restriction of 5# - 04/01/23 Goal status: INITIAL   LONG TERM GOALS: Target date: 04/03/2023    Pt will report max 2/10 low back and abdominal pain with 1 hour of walking  Baseline:  Goal status: INITIAL  2.  Pt will be I with advanced HEP and dem all exercises correctly Baseline:  Goal status: INITIAL  3.  Pt will be able to sleep at least 5 consecutive hours at night without increased pain Baseline: 2.5 hrs - 04/01/23 Goal status: INITIAL   PLAN:  PT  FREQUENCY: 1-2x/week  PT DURATION: 12 weeks  PLANNED INTERVENTIONS: 97110-Therapeutic exercises, 97530- Therapeutic activity, 97112- Neuromuscular re-education, 97535- Self Care, 16109- Manual therapy, [phone]- Electrical stimulation (manual), Patient/Family education, Taping, Dry Needling, Joint mobilization, Joint manipulation, Spinal manipulation, Spinal mobilization, Moist heat, and Biofeedback  PLAN FOR NEXT SESSION: assess goals for recert. Gait training with cane.   Mayer Camel, PTA 04/01/23 12:32 PM [facility] [address] [place], [place], [address] Phone: [phone]   Fax:  [phone]

## 2023-04-01 NOTE — Progress Notes (Signed)
 Diagnosis: Hyperemesis, Anemia.  Provider:  Chilton Greathouse MD  Procedure: IV Infusion  IV Type: Peripheral, IV Location: L Antecubital  Banana Bag, Dose: 1000 ml  Infusion Start Time: 1449  Infusion Stop Time: 1600   Venofer (Iron Sucrose), Dose: 200 mg IV Push   promethazine (PHENERGAN) 25 mg in sodium chloride , Dose: 1000 ml  Infusion Start Time: 1612  Infusion Stop Time: 1720   Post Infusion IV Care: Observation period completed and Peripheral IV Discontinued  Discharge: Condition: Good, Destination: Home . AVS Declined  Performed by:  Garnette Czech, RN

## 2023-04-02 ENCOUNTER — Encounter: Payer: Self-pay | Admitting: Physical Therapy

## 2023-04-02 ENCOUNTER — Ambulatory Visit: Attending: Family Medicine | Admitting: Physical Therapy

## 2023-04-02 DIAGNOSIS — M6281 Muscle weakness (generalized): Secondary | ICD-10-CM | POA: Diagnosis present

## 2023-04-02 NOTE — Therapy (Signed)
 OUTPATIENT PHYSICAL THERAPY FEMALE PELVIC TREATMENT   Patient Name: Heather Malone MRN: [id number] DOB:[birth year], 33 y.o., female Today's Date: 04/02/2023  END OF SESSION:  PT End of Session - 04/02/23 1301     Visit Number 8    Authorization Type [employer]    Authorization Time Period no auth required    PT Start Time 1235    PT Stop Time 1315    PT Time Calculation (min) 40 min    Activity Tolerance Patient tolerated treatment well    Behavior During Therapy WFL for tasks assessed/performed                  Past Medical History:  Diagnosis Date   Anemia    Anxiety    Asthma    albuterol prn   Headache    migraines   Hx of trichomoniasis    Hx of varicella    Neck injury, subsequent encounter 07/26/2016   Placental abruption in third trimester 09/04/2014   Pre-eclampsia    Pregnancy induced hypertension    Subchorionic hemorrhage in third trimester 07/26/2014   Velamentous insertion of umbilical cord    Past Surgical History:  Procedure Laterality Date   TONSILLECTOMY     Patient Active Problem List   Diagnosis Date Noted   Migraine without aura and without status migrainosus, not intractable 03/06/2023   Rebound headache 03/06/2023   Pregnancy headache in third trimester 03/06/2023   Abdominal pain in pregnancy, second trimester 02/12/2023   Abdominal pain affecting pregnancy 02/11/2023   Hyperemesis 12/10/2022   Supervision of high-risk pregnancy 10/29/2022   History of severe pre-eclampsia 10/29/2022   Elevated blood pressure complicating pregnancy in first trimester, antepartum 10/01/2022   Anemia in pregnancy 08/05/2020   Alpha thalassemia silent carrier 08/02/2020   Chronic headaches 04/12/2020    PCP: Patient, No Pcp Per PCP - General  REFERRING PROVIDER: Bernerd Limbo, CNM  Ref Provider  REFERRING DIAG: R10.30 (ICD-10-CM) - Lower abdominal pain    THERAPY DIAG:  Muscle weakness (generalized)  Rationale for Evaluation and  Treatment: Rehabilitation  ONSET DATE: 10/2022  SUBJECTIVE:                                                                                                                                                                                           SUBJECTIVE STATEMENT: Pt reports that her back pain her been better with pool therapy, Wants to continue it. She is [redacted] weeks pregnant Has decreased low back pain post pool therapy 4-5/10 Pt on bed rest, being monitored for abdominal pain every month.  She has had right LE  numbness Wears abdominal belt at times  PAIN:  Are you having pain? Yes NPRS scale: 5/10- abdomen,  4/10 RLE to mid-thigh Pain location: see above Pain type: dull Pain description: intermittent and constant   Aggravating factors: sitting too long, walking too much Relieving factors: ice packs on backs, bending down  PRECAUTIONS: None  RED FLAGS: None   WEIGHT BEARING RESTRICTIONS: No  FALLS:  Has patient fallen in last 6 months? No  LIVING ENVIRONMENT: Lives with: lives with their family Lives in: House/apartment Stairs: No Has following equipment at home: None  OCCUPATION: desk job, benefits specialist  PLOF: Independent  PATIENT GOALS: relieve the pain  PERTINENT HISTORY:  Nothing relevant Sexual abuse: No  BOWEL MOVEMENT:n/a   URINATION: no issues  INTERCOURSE: no issues  PREGNANCY: Vaginal deliveries 2 Tearing No C-section deliveries 0 Currently pregnant Yes: 19 weeks  PROLAPSE: None   OBJECTIVE:  Note: Objective measures were completed at Evaluation unless otherwise noted.     COGNITION: Overall cognitive status: Within functional limits for tasks assessed     SENSATION: Light touch: Appears intact Proprioception: Appears intact  MUSCLE LENGTH: Hamstrings: Right 80 deg; Left 80 deg   LUMBAR SPECIAL TESTS:  Stork standing: Positive for weakness bilat    GAIT: Comments: antalgic, wide stance  POSTURE: rounded  shoulders, forward head, increased lumbar lordosis, and anterior pelvic tilt  PELVIC ALIGNMENT: seems even  LUMBARAROM/PROM:  A/PROM A/PROM  eval  Flexion 50% difficult  Extension 50% difficult   LOWER EXTREMITY ROM: some limitations throughout d/t stiffness and pain  LOWER EXTREMITY MMT: at least 4/5 grossly overall    PALPATION:   General  abdomen- low tone                External Perineal Exam to be assessed as needed                             Internal Pelvic Floor to be assessed as needed  Patient confirms identification and approves PT to assess internal pelvic floor and treatment No  PELVIC MMT:   MMT eval  Vaginal   Internal Anal Sphincter   External Anal Sphincter   Puborectalis   Diastasis Recti   (Blank rows = not tested)        TONE: Low tone abdomen High tone and trigger points lumbar paraspinals Pelvic girdle external pressure relieved pain ( serola belt trial, manual pressure)  PROLAPSE: To be assessed if needed  TODAY'S TREATMENT:                                                                                                                               04/02/23    Neuro reed- trial of amb with a cane, yoga ball pelvic rocks, cat/ cow There acts- education on perineal massage, review of progress, support belt   Pt seen for aquatic therapy today.  Treatment took  place in water 3.5-4.75 ft in depth at the [facility] pool. Temp of water was 91.  Pt entered/exited the pool via stairs in step-through pattern with bilat rail. ** pt transported from lobby in [facility]  In 4+ ft of water:  * UE on barbell inWalking forward/ backward UE 3 laps, with RLE on down slope (facing hot tub) * side stepping R/L -> with arm addct/ abdct with rainbow hand floats x 3 laps total * marching backward/forward with reciprocal row with UE on yellow hand floats 2 laps * UE on wall:  hip ext 2 x 5 each; hip abdct/ addct 2 x5 each; heel raises x 10;  * Return to  walking forward/ backward with yellow hand floats on surface * tricep push down with 1/2 hollow noodle  at side x 8 each * ab set with hollow 1/2 noodle pull down to thighs x 10  * walking forward/ backward with reciprocal arm swing   ** pt transported back to lobby in [facility]; encouraged to utilize transportation chair with boyfriend's assist to get to/from pool area.   Last land session: DATE: 02/20/23  Manual- STM lumbar paraspinals, QL  and right glutes  Therapeutic activities- trial of kinesiotaping- 4 strips herringbone pattern on abdomen Trial of sheet instead of belt- not tol well on abdomen                      HOME EXERCISE PROGRAM: [id number]  ASSESSMENT:  CLINICAL IMPRESSION:  Reeval finished. Extending pool therapy 8 more weeks. Pt gets a lot of relief. Tried [facility]- pt unsure if she likes it, does not seem to provide much stability.      OBJECTIVE IMPAIRMENTS: decreased activity tolerance, decreased knowledge of condition, difficulty walking, decreased strength, increased muscle spasms, impaired flexibility, impaired tone, improper body mechanics, and pain.   ACTIVITY LIMITATIONS: carrying, lifting, bending, standing, squatting, sleeping, stairs, transfers, bed mobility, locomotion level, and caring for others  PARTICIPATION LIMITATIONS: meal prep, cleaning, laundry, shopping, community activity, occupation, and yard work  PERSONAL FACTORS: Fitness and Time since onset of injury/illness/exacerbation are also affecting patient's functional outcome.   REHAB POTENTIAL: Good  CLINICAL DECISION MAKING: Stable/uncomplicated  EVALUATION COMPLEXITY: Low   GOALS: Goals reviewed with patient? Yes  SHORT TERM GOALS: Target date: 02/06/2023    Pt will be I with her HEP and demonstrate her exercises correctly Baseline: Goal status:met  2.  Pt will report max 4/10 pain with walking for 30 mins Baseline:  Goal status: not met ( in the pool - met)  3.  Pt will be able to lift  her 2 yo child with good pressure management at least 50% of the time Baseline: unable/ lift restriction of 5# - 04/01/23 Goal status: INITIAL   LONG TERM GOALS: Target date: 04/03/2023 updated 05/28/2023    Pt will report max 2/10 low back and abdominal pain with 1 hour of walking  Baseline:  Goal status: INITIAL  2.  Pt will be I with advanced HEP and dem all exercises correctly Baseline:  Goal status: INITIAL  3.  Pt will be able to sleep at least 5 consecutive hours at night without increased pain Baseline: 2.5 hrs - 04/01/23 Goal status: INITIAL   PLAN:  PT FREQUENCY: 1-2x/week  PT DURATION: 8 weeks  PLANNED INTERVENTIONS: 97110-Therapeutic exercises, 97530- Therapeutic activity, 97112- Neuromuscular re-education, 97535- Self Care, 54098- Manual therapy, [phone]- Electrical stimulation (manual), Patient/Family education, Taping, Dry Needling, Joint mobilization, Joint manipulation, Spinal manipulation,  Spinal mobilization, Moist heat, and Biofeedback  PLAN FOR NEXT SESSION: cont pool therapy Felipe Cabell, PT 04/02/23 1:13 PM

## 2023-04-06 ENCOUNTER — Inpatient Hospital Stay (HOSPITAL_COMMUNITY)
Admission: AD | Admit: 2023-04-06 | Discharge: 2023-04-06 | Disposition: A | Discharge status: Home / Self Care | Attending: Obstetrics and Gynecology | Admitting: Obstetrics and Gynecology

## 2023-04-06 ENCOUNTER — Encounter (HOSPITAL_COMMUNITY): Payer: Self-pay | Admitting: Obstetrics and Gynecology

## 2023-04-06 DIAGNOSIS — O368130 Decreased fetal movements, third trimester, not applicable or unspecified: Secondary | ICD-10-CM | POA: Diagnosis not present

## 2023-04-06 DIAGNOSIS — Z3689 Encounter for other specified antenatal screening: Secondary | ICD-10-CM

## 2023-04-06 DIAGNOSIS — Z3A31 31 weeks gestation of pregnancy: Secondary | ICD-10-CM | POA: Diagnosis not present

## 2023-04-06 DIAGNOSIS — Z3493 Encounter for supervision of normal pregnancy, unspecified, third trimester: Secondary | ICD-10-CM

## 2023-04-06 LAB — WET PREP, GENITAL
Clue Cells Wet Prep HPF POC: NONE SEEN
Sperm: NONE SEEN
Trich, Wet Prep: NONE SEEN
WBC, Wet Prep HPF POC: >=10 — AB (ref <10)
Yeast Wet Prep HPF POC: NONE SEEN

## 2023-04-06 LAB — URINALYSIS, ROUTINE W REFLEX MICROSCOPIC
Bilirubin Urine: NEGATIVE
Glucose, UA: NEGATIVE mg/dL
Hgb urine dipstick: NEGATIVE
Ketones, ur: NEGATIVE mg/dL
Nitrite: NEGATIVE
Protein, ur: NEGATIVE mg/dL
Specific Gravity, Urine: 1.021 (ref 1.005–1.030)
pH: 7 (ref 5.0–8.0)

## 2023-04-06 LAB — RUPTURE OF MEMBRANE (ROM)PLUS: Rom Plus: NEGATIVE

## 2023-04-06 LAB — FETAL FIBRONECTIN: Fetal Fibronectin: NEGATIVE

## 2023-04-06 NOTE — MAU Provider Note (Signed)
 Chief Complaint:  Back Pain, Decreased Fetal Movement, and Pelvic Pain   HPI   None     Heather Malone is a 33 y.o. [id number] at [redacted]w[redacted]d who presents to maternity admissions reporting DFM, since yesterday. Still perceiving fetal movement but not as regularly.  She endorses lower back pain which has been coming and gong since yesterday. Reports concern for LOF since around 0800 or 0900. Reports it happened once just when she first got up this morning. She reports that she has not had to wear a pad. Last episode of intercourse was >72 hours prior.   Pregnancy Course: Pain in pregnancy with ongoing PT,   Past Medical History:  Diagnosis Date   Anemia    Anxiety    Asthma    albuterol prn   Headache    migraines   Hx of trichomoniasis    Hx of varicella    Neck injury, subsequent encounter 07/26/2016   Placental abruption in third trimester 09/04/2014   Pre-eclampsia    Pregnancy induced hypertension    Subchorionic hemorrhage in third trimester 07/26/2014   Velamentous insertion of umbilical cord    OB History  Gravida Para Term Preterm AB Living  3 2 1 1  0 2  SAB IAB Ectopic Multiple Live Births  0 0 0 0 2    # Outcome Date GA Lbr Len/2nd Weight Sex Type Anes PTL Lv  3 Current           2 Preterm 09/12/20 [redacted]w[redacted]d 03:28 1900 g F Vag-Spont None  LIV     Birth Comments: IOL for severe pre-eclampsia  1 Term 09/05/14 [redacted]w[redacted]d 1578:09 / 00:10 3340 g M Vag-Spont None  LIV     Birth Comments: placenta abrution, velamentous cord insertion   Past Surgical History:  Procedure Laterality Date   TONSILLECTOMY     Family History  Problem Relation Age of Onset   Hypertension Mother    Hypertension Maternal Grandmother    Heart attack Maternal Grandmother    Hypertension Maternal Grandfather    Cancer Maternal Grandfather    Diabetes Paternal Grandmother    Social History   Tobacco Use   Smoking status: Former    Types: Cigars   Smokeless tobacco: Never  Vaping Use   Vaping  status: Never Used  Substance Use Topics   Alcohol use: Not Currently    Comment: weekends   Drug use: Not Currently    Types: Marijuana    Comment: none with + UPT   No Known Allergies No medications prior to admission.    I have reviewed patient's Past Medical Hx, Surgical Hx, Family Hx, Social Hx, medications and allergies.   ROS  Pertinent items noted in HPI and remainder of comprehensive ROS otherwise negative.   PHYSICAL EXAM  Patient Vitals for the past 24 hrs:  BP Temp Temp src Pulse Resp SpO2 Height Weight  04/06/23 1031 116/77 (!) 97.4 F (36.3 C) Oral 89 16 100 % -- --  04/06/23 1026 -- -- -- -- -- -- 5\' 2"  (1.575 m) 80.2 kg    Physical Exam Vitals and nursing note reviewed. Exam conducted with a chaperone present.  Constitutional:      General: She is not in acute distress.    Appearance: Normal appearance. She is normal weight. She is not ill-appearing, toxic-appearing or diaphoretic.  HENT:     Head: Normocephalic.  Cardiovascular:     Rate and Rhythm: Normal rate and regular rhythm.  Pulmonary:  Effort: Pulmonary effort is normal.  Genitourinary:    General: Normal vulva.     Exam position: Lithotomy position.     Vagina: Normal.     Comments: Speculum exam with observation of visually closed cervix. Physiologic discharge noted. Negative pooling. FFN collected first followed by ROM plus, wet prep, GC.  Skin:    General: Skin is warm.     Capillary Refill: Capillary refill takes less than 2 seconds.  Neurological:     General: No focal deficit present.     Mental Status: She is oriented to person, place, and time. Mental status is at baseline.  Psychiatric:        Mood and Affect: Mood normal.        Behavior: Behavior normal.        Thought Content: Thought content normal.        Judgment: Judgment normal.         Fetal Tracing: Baseline: 150 Variability: mod Accelerations:  15x15 Decelerations: variable Toco: UI   Labs: Results for  orders placed or performed during the hospital encounter of 04/06/23 (from the past 24 hours)  Urinalysis, Routine w reflex microscopic -Urine, Clean Catch     Status: Abnormal   Collection Time: 04/06/23 10:47 AM  Result Value Ref Range   Color, Urine YELLOW YELLOW   APPearance HAZY (A) CLEAR   Specific Gravity, Urine 1.021 1.005 - 1.030   pH 7.0 5.0 - 8.0   Glucose, UA NEGATIVE NEGATIVE mg/dL   Hgb urine dipstick NEGATIVE NEGATIVE   Bilirubin Urine NEGATIVE NEGATIVE   Ketones, ur NEGATIVE NEGATIVE mg/dL   Protein, ur NEGATIVE NEGATIVE mg/dL   Nitrite NEGATIVE NEGATIVE   Leukocytes,Ua TRACE (A) NEGATIVE   RBC / HPF 0-5 0 - 5 RBC/hpf   WBC, UA 0-5 0 - 5 WBC/hpf   Bacteria, UA RARE (A) NONE SEEN   Squamous Epithelial / HPF 11-20 0 - 5 /HPF   Mucus PRESENT   Rupture of Membrane (ROM) Plus     Status: None   Collection Time: 04/06/23 11:07 AM  Result Value Ref Range   Rom Plus NEGATIVE   Wet prep, genital     Status: Abnormal   Collection Time: 04/06/23 11:16 AM   Specimen: Cervix  Result Value Ref Range   Yeast Wet Prep HPF POC NONE SEEN NONE SEEN   Trich, Wet Prep NONE SEEN NONE SEEN   Clue Cells Wet Prep HPF POC NONE SEEN NONE SEEN   WBC, Wet Prep HPF POC >=10 (A) <10   Sperm NONE SEEN   Fetal fibronectin     Status: None   Collection Time: 04/06/23 11:16 AM  Result Value Ref Range   Fetal Fibronectin NEGATIVE NEGATIVE    Imaging:  No results found.  MDM & MAU COURSE  MDM: Speculum exam to collect swabs, visually observe cervix.  Rule out vaginitis, PTL. Rule out ROM.  Fetal status reassuring.   MAU Course: Orders Placed This Encounter  Procedures   Wet prep, genital   Urinalysis, Routine w reflex microscopic -Urine, Clean Catch   Fetal fibronectin   Rupture of Membrane (ROM) Plus   Discharge patient Discharge disposition: 01-Home or Self Care; Discharge patient date: 04/06/2023   No orders of the defined types were placed in this encounter.   ASSESSMENT    1. Intact amniotic membranes during pregnancy in third trimester   2. [redacted] weeks gestation of pregnancy   3. Non-stress test reactive   4. Movement  of fetus present during pregnancy in third trimester    Reassuring fetal status with reactive NST and good maternal perception of fetal movement.  FFN, ROM plus and swabs negative.   PLAN  Discharge home in stable condition.  Routine follow up at [facility] and return precautions advised.    Follow-up Information     [facility] Follow up.   Specialty: Obstetrics and Gynecology Why: As scheduled for ongoing prenatal care Contact information: [address] [place] [address] [phone]                Allergies as of 04/06/2023   No Known Allergies      Medication List     STOP taking these medications    amoxicillin 500 MG tablet Commonly known as: AMOXIL       TAKE these medications    Bonjesta 20-20 MG Tbcr Generic drug: Doxylamine-Pyridoxine ER Take 1 tablet by mouth at bedtime.   Butalbital-APAP-Caffeine 50-325-40 MG capsule Take 1-2 capsules by mouth every 6 (six) hours as needed for headache.   cyclobenzaprine 10 MG tablet Commonly known as: FLEXERIL Take 1 tablet (10 mg total) by mouth every 8 (eight) hours as needed for muscle spasms.   gabapentin 300 MG capsule Commonly known as: Neurontin Take 1 capsule (300 mg total) by mouth 3 (three) times daily as needed.   multivitamin-prenatal 27-0.8 MG Tabs tablet Take 1 tablet by mouth daily at 12 noon.   omeprazole 40 MG capsule Commonly known as: PRILOSEC Take 1 capsule (40 mg total) by mouth daily.   promethazine 12.5 MG suppository Commonly known as: PHENERGAN Place 1 suppository (12.5 mg total) rectally every 6 (six) hours as needed for nausea or vomiting.        Lamont Snowball, MSN, CNM 04/06/2023 1:24 PM  Certified Nurse Midwife, [facility]

## 2023-04-06 NOTE — MAU Note (Signed)
.  Heather Malone is a 33 y.o. at [redacted]w[redacted]d here in MAU reporting: DFM since yesterday - she felt less movement than normal yesterday and has not felt any movement at all today. She also reports a small amount of leaking of clear, watery fluid around 0800 or 0900 this AM. Has not had any leaking since. Having pain in her lower back and lower pelvic area (5/10) that comes and goes. Denies VB.  LMP: N/A Onset of complaint: yesterday Pain score: 5/10 Vitals:   04/06/23 1031  BP: 116/77  Pulse: 89  Resp: 16  Temp: (!) 97.4 F (36.3 C)  SpO2: 100%     FHT: 145  Lab orders placed from triage: UA

## 2023-04-07 ENCOUNTER — Other Ambulatory Visit: Payer: Self-pay | Admitting: Certified Nurse Midwife

## 2023-04-07 ENCOUNTER — Encounter (HOSPITAL_BASED_OUTPATIENT_CLINIC_OR_DEPARTMENT_OTHER): Payer: Self-pay | Admitting: Physical Therapy

## 2023-04-07 ENCOUNTER — Ambulatory Visit (HOSPITAL_BASED_OUTPATIENT_CLINIC_OR_DEPARTMENT_OTHER): Payer: Medicaid Other | Admitting: Physical Therapy

## 2023-04-07 ENCOUNTER — Encounter: Payer: Self-pay | Admitting: Certified Nurse Midwife

## 2023-04-07 DIAGNOSIS — M6281 Muscle weakness (generalized): Secondary | ICD-10-CM

## 2023-04-07 DIAGNOSIS — A749 Chlamydial infection, unspecified: Secondary | ICD-10-CM

## 2023-04-07 LAB — GC/CHLAMYDIA PROBE AMP (~~LOC~~) NOT AT ARMC
Chlamydia: POSITIVE — AB
Comment: NEGATIVE
Comment: NORMAL
Neisseria Gonorrhea: NEGATIVE

## 2023-04-07 MED ORDER — AZITHROMYCIN 500 MG PO TABS: 1000.0000 mg | ORAL_TABLET | Freq: Once | ORAL | 0 refills | Status: AC

## 2023-04-07 NOTE — Therapy (Signed)
 OUTPATIENT PHYSICAL THERAPY FEMALE PELVIC TREATMENT   Patient Name: Heather Malone MRN: [id number] DOB:12-25-1990, 33 y.o., female Today's Date: 04/07/2023  END OF SESSION:  PT End of Session - 04/07/23 1447     Visit Number 9    Authorization Type [employer]    Authorization Time Period no auth required    PT Start Time 1440    PT Stop Time 1522    PT Time Calculation (min) 42 min    Behavior During Therapy WFL for tasks assessed/performed             Past Medical History:  Diagnosis Date   Anemia    Anxiety    Asthma    albuterol prn   Headache    migraines   Hx of trichomoniasis    Hx of varicella    Neck injury, subsequent encounter 07/26/2016   Placental abruption in third trimester 09/04/2014   Pre-eclampsia    Pregnancy induced hypertension    Subchorionic hemorrhage in third trimester 07/26/2014   Velamentous insertion of umbilical cord    Past Surgical History:  Procedure Laterality Date   TONSILLECTOMY     Patient Active Problem List   Diagnosis Date Noted   Migraine without aura and without status migrainosus, not intractable 03/06/2023   Rebound headache 03/06/2023   Pregnancy headache in third trimester 03/06/2023   Abdominal pain in pregnancy, second trimester 02/12/2023   Abdominal pain affecting pregnancy 02/11/2023   Hyperemesis 12/10/2022   Supervision of high-risk pregnancy 10/29/2022   History of severe pre-eclampsia 10/29/2022   Elevated blood pressure complicating pregnancy in first trimester, antepartum 10/01/2022   Anemia in pregnancy 08/05/2020   Alpha thalassemia silent carrier 08/02/2020   Chronic headaches 04/12/2020    PCP: Patient, No Pcp Per PCP - General  REFERRING PROVIDER: Bernerd Limbo, CNM  Ref Provider  REFERRING DIAG: R10.30 (ICD-10-CM) - Lower abdominal pain    THERAPY DIAG:  Muscle weakness (generalized)  Rationale for Evaluation and Treatment: Rehabilitation  ONSET DATE: 10/2022  SUBJECTIVE:                                                                                                                                                                                            SUBJECTIVE STATEMENT: Pt states, "I feel heavy today".   PAIN:  Are you having pain? Yes NPRS scale: 5/10- abdomen,  5/10 RLE to mid-thigh Pain location: see above Pain type: dull Pain description: intermittent and constant   Aggravating factors: sitting too long, walking too much Relieving factors: ice packs on backs, bending down  PRECAUTIONS: None  RED FLAGS: None  WEIGHT BEARING RESTRICTIONS: No  FALLS:  Has patient fallen in last 6 months? No  LIVING ENVIRONMENT: Lives with: lives with their family Lives in: House/apartment Stairs: No Has following equipment at home: None  OCCUPATION: desk job, benefits specialist  PLOF: Independent  PATIENT GOALS: relieve the pain  PERTINENT HISTORY:  Nothing relevant Sexual abuse: No  BOWEL MOVEMENT:n/a   URINATION: no issues  INTERCOURSE: no issues  PREGNANCY: Vaginal deliveries 2 Tearing No C-section deliveries 0 Currently pregnant Yes: 19 weeks  PROLAPSE: None   OBJECTIVE:  Note: Objective measures were completed at Evaluation unless otherwise noted.     COGNITION: Overall cognitive status: Within functional limits for tasks assessed     SENSATION: Light touch: Appears intact Proprioception: Appears intact  MUSCLE LENGTH: Hamstrings: Right 80 deg; Left 80 deg   LUMBAR SPECIAL TESTS:  Stork standing: Positive for weakness bilat    GAIT: Comments: antalgic, wide stance  POSTURE: rounded shoulders, forward head, increased lumbar lordosis, and anterior pelvic tilt  PELVIC ALIGNMENT: seems even  LUMBARAROM/PROM:  A/PROM A/PROM  eval  Flexion 50% difficult  Extension 50% difficult   LOWER EXTREMITY ROM: some limitations throughout d/t stiffness and pain  LOWER EXTREMITY MMT: at least 4/5 grossly  overall    PALPATION:   General  abdomen- low tone                External Perineal Exam to be assessed as needed                             Internal Pelvic Floor to be assessed as needed  Patient confirms identification and approves PT to assess internal pelvic floor and treatment No  PELVIC MMT:   MMT eval  Vaginal   Internal Anal Sphincter   External Anal Sphincter   Puborectalis   Diastasis Recti   (Blank rows = not tested)        TONE: Low tone abdomen High tone and trigger points lumbar paraspinals Pelvic girdle external pressure relieved pain ( serola belt trial, manual pressure)  PROLAPSE: To be assessed if needed  TODAY'S TREATMENT:                                                                                                                              [facility] Adult PT Treatment:                                                DATE: 3//11/25 Pt seen for aquatic therapy today.  Treatment took place in water 3.5-4.75 ft in depth at the [facility] pool. Temp of water was 91.  Pt entered/exited the pool via stairs in step-through pattern with bilat rail. ** pt arrived ambulating with RW  In 4+ ft of  water:  * UE on yellow hand floats: Walking forward/ backward UE 3 laps, with RLE on down slope (facing hot tub) * side stepping R/L -> with arm addct/ abdct with yellow ->  rainbow hand floats x 3 laps total * marching backward/forward with reciprocal row with UE on yellow hand floats 2 laps * UE on rainbow hand floats:  hip flex/ ext  2 x 10 each; hip abdct/ addct 2 x10 each; heel raises x 10;  * Return to walking forward/ backward with yellow hand floats on surface * tricep push down with rainbow hand floats  at side x 10 each * R hip flexor stretch with UE on wall x 30s * seated on bench in water with feet on step - alternating LAQ with PF/DF; STS with forward arm reach and ab set x 5 * walking forward/ backward with reciprocal arm swing    04/02/23 Neuro  re-ed- trial of amb with a cane, yoga ball pelvic rocks, cat/ cow There acts- education on perineal massage, review of progress, support belt    Last land session: DATE: 02/20/23  Manual- STM lumbar paraspinals, QL  and right glutes  Therapeutic activities- trial of kinesiotaping- 4 strips herringbone pattern on abdomen Trial of sheet instead of belt- not tol well on abdomen                      HOME EXERCISE PROGRAM: [id number]  ASSESSMENT:  CLINICAL IMPRESSION: Pt reported reduction of radicular symptoms in RLE after 15 min submerged 70%.  Further reduction of symptoms after nerve flossing in seated position towards end of session. Will continue to progress as tolerated.       OBJECTIVE IMPAIRMENTS: decreased activity tolerance, decreased knowledge of condition, difficulty walking, decreased strength, increased muscle spasms, impaired flexibility, impaired tone, improper body mechanics, and pain.   ACTIVITY LIMITATIONS: carrying, lifting, bending, standing, squatting, sleeping, stairs, transfers, bed mobility, locomotion level, and caring for others  PARTICIPATION LIMITATIONS: meal prep, cleaning, laundry, shopping, community activity, occupation, and yard work  PERSONAL FACTORS: Fitness and Time since onset of injury/illness/exacerbation are also affecting patient's functional outcome.   REHAB POTENTIAL: Good  CLINICAL DECISION MAKING: Stable/uncomplicated  EVALUATION COMPLEXITY: Low   GOALS: Goals reviewed with patient? Yes  SHORT TERM GOALS: Target date: 02/06/2023    Pt will be I with her HEP and demonstrate her exercises correctly Baseline: Goal status:met  2.  Pt will report max 4/10 pain with walking for 30 mins Baseline:  Goal status: not met ( in the pool - met)  3.  Pt will be able to lift her 2 yo child with good pressure management at least 50% of the time Baseline: unable/ lift restriction of 5# - 04/01/23 Goal status: deferred    LONG TERM GOALS:  Target date: 04/03/2023 updated 05/28/2023    Pt will report max 2/10 low back and abdominal pain with 1 hour of walking  Baseline:  Goal status: INITIAL  2.  Pt will be I with advanced HEP and dem all exercises correctly Baseline:  Goal status: INITIAL  3.  Pt will be able to sleep at least 5 consecutive hours at night without increased pain Baseline: 2.5 hrs - 04/01/23 Goal status: INITIAL   PLAN:  PT FREQUENCY: 1-2x/week  PT DURATION: 8 weeks  PLANNED INTERVENTIONS: 97110-Therapeutic exercises, 97530- Therapeutic activity, 97112- Neuromuscular re-education, 97535- Self Care, 54098- Manual therapy, [phone]- Electrical stimulation (manual), Patient/Family education, Taping, Dry Needling, Joint mobilization, Joint manipulation,  Spinal manipulation, Spinal mobilization, Moist heat, and Biofeedback  PLAN FOR NEXT SESSION: cont pool therapy  Mayer Camel, PTA 04/07/23 3:23 PM [facility] [address] [place], [place], [address] Phone: [phone]   Fax:  [phone]

## 2023-04-08 ENCOUNTER — Ambulatory Visit: Payer: Self-pay | Admitting: Certified Nurse Midwife

## 2023-04-08 ENCOUNTER — Other Ambulatory Visit: Payer: Self-pay

## 2023-04-08 ENCOUNTER — Ambulatory Visit

## 2023-04-08 VITALS — BP 121/83 | HR 94 | Wt 174.1 lb

## 2023-04-08 VITALS — BP 119/85 | HR 79 | Temp 98.3°F | Resp 20 | Ht 62.0 in | Wt 175.0 lb

## 2023-04-08 DIAGNOSIS — R109 Unspecified abdominal pain: Secondary | ICD-10-CM

## 2023-04-08 DIAGNOSIS — A749 Chlamydial infection, unspecified: Secondary | ICD-10-CM | POA: Diagnosis not present

## 2023-04-08 DIAGNOSIS — R1110 Vomiting, unspecified: Secondary | ICD-10-CM

## 2023-04-08 DIAGNOSIS — O26893 Other specified pregnancy related conditions, third trimester: Secondary | ICD-10-CM

## 2023-04-08 DIAGNOSIS — O26899 Other specified pregnancy related conditions, unspecified trimester: Secondary | ICD-10-CM

## 2023-04-08 DIAGNOSIS — O98813 Other maternal infectious and parasitic diseases complicating pregnancy, third trimester: Secondary | ICD-10-CM | POA: Diagnosis not present

## 2023-04-08 DIAGNOSIS — O0993 Supervision of high risk pregnancy, unspecified, third trimester: Secondary | ICD-10-CM

## 2023-04-08 DIAGNOSIS — Z3A31 31 weeks gestation of pregnancy: Secondary | ICD-10-CM | POA: Diagnosis not present

## 2023-04-08 DIAGNOSIS — O99013 Anemia complicating pregnancy, third trimester: Secondary | ICD-10-CM

## 2023-04-08 DIAGNOSIS — Z8759 Personal history of other complications of pregnancy, childbirth and the puerperium: Secondary | ICD-10-CM | POA: Diagnosis not present

## 2023-04-08 DIAGNOSIS — R519 Other specified pregnancy related conditions, third trimester: Secondary | ICD-10-CM

## 2023-04-08 DIAGNOSIS — Z23 Encounter for immunization: Secondary | ICD-10-CM

## 2023-04-08 MED ORDER — LACTATED RINGERS IV SOLN
Freq: Once | INTRAVENOUS | Status: AC
  Filled 2023-04-08: qty 10

## 2023-04-08 MED ORDER — AZITHROMYCIN 500 MG PO TABS
1000.0000 mg | ORAL_TABLET | Freq: Once | ORAL | Status: AC
Start: 2023-04-08 — End: 2023-04-08
  Administered 2023-04-08: 1000 mg via ORAL

## 2023-04-08 MED ORDER — SODIUM CHLORIDE 0.9 % IV SOLN
25.0000 mg | INTRAVENOUS | Status: DC
  Administered 2023-04-08: 25 mg via INTRAVENOUS
  Filled 2023-04-08: qty 1

## 2023-04-08 NOTE — Progress Notes (Signed)
   PRENATAL VISIT NOTE  Subjective:  Heather Malone is a 33 y.o. [id number] at [redacted]w[redacted]d being seen today for ongoing prenatal care.  She is currently monitored for the following issues for this high-risk pregnancy and has Chronic headaches; Alpha thalassemia silent carrier; Anemia in pregnancy; Elevated blood pressure complicating pregnancy in first trimester, antepartum; Supervision of high-risk pregnancy; History of severe pre-eclampsia; Hyperemesis; Abdominal pain affecting pregnancy; Migraine without aura and without status migrainosus, not intractable; Rebound headache; Pregnancy headache in third trimester; [redacted] weeks gestation of pregnancy; and Chlamydia infection affecting pregnancy in third trimester on their problem list.  Patient reports  continued improvement with aquatic therapy and IV infusions .  Contractions: Not present. Vag. Bleeding: None.  Movement: Present. Denies leaking of fluid.   The following portions of the patient's history were reviewed and updated as appropriate: allergies, current medications, past family history, past medical history, past social history, past surgical history and problem list.   Objective:   Vitals:   04/08/23 1138  BP: 121/83  Pulse: 94  Weight: 79 kg    Fetal Status: Fetal Heart Rate (bpm): 145 Fundal Height: 32 cm Movement: Present     General:  Alert, oriented and cooperative. Patient is in no acute distress.  Skin: Skin is warm and dry. No rash noted.   Cardiovascular: Normal heart rate noted  Respiratory: Normal respiratory effort, no problems with respiration noted  Abdomen: Soft, gravid, appropriate for gestational age.  Pain/Pressure: Present     Pelvic: Cervical exam deferred        Extremities: Normal range of motion.  Edema: None  Mental Status: Normal mood and affect. Normal behavior. Normal judgment and thought content.   Assessment and Plan:  Pregnancy: G3P1102 at [redacted]w[redacted]d 1. Supervision of high risk pregnancy in third trimester  (Primary) -Doing well, reports +FM  2. [redacted] weeks gestation of pregnancy -Routine OB care  3. History of severe pre-eclampsia -Planning IOL at 39 weeks  4. Anemia during pregnancy in third trimester -Has completed 5 Venofer treatments. Will repeat labs at next visit.  5. Abdominal pain affecting pregnancy -Continue aquatic therapy  6. Chlamydia infection affecting pregnancy in third trimester - Treated today with Azythromycin 1000mg  PO. - Informed patient that she will be offered Erythromycin eye ointment for baby at delivery and that it is recommended.  Preterm labor symptoms and general obstetric precautions including but not limited to vaginal bleeding, contractions, leaking of fluid and fetal movement were reviewed in detail with the patient. Please refer to After Visit Summary for other counseling recommendations.     Future Appointments  Date Time Provider Department Center  04/08/2023  2:00 PM [facility]-CHAIR 2 CH-IN[facility] None  04/10/2023  1:45 PM Carlson-Long, Lise Auer [facility]  04/22/2023  9:35 AM Bernerd Limbo, CNM [facility]  04/22/2023 11:15 AM [facility] NURSE [facility]  04/22/2023 11:30 AM [facility] US5 [facility]  04/23/2023 11:00 AM Carlson-Long, Jennifer L [facility]RE[facility]  04/27/2023 10:15 AM Salvadore Oxford [facility]  05/06/2023 11:15 AM Bernerd Limbo, CNM [facility]  05/13/2023 11:15 AM Bernerd Limbo, CNM [facility]  05/20/2023 11:15 AM Bernerd Limbo, CNM [facility]  05/27/2023 11:15 AM Bernerd Limbo, CNM [facility]    Elige Ko, Student-MidWife

## 2023-04-08 NOTE — Progress Notes (Signed)
 Diagnosis: Hyperemesis  Provider:  Chilton Greathouse MD  Procedure: IV Infusion  IV Type: Peripheral, IV Location: R Antecubital   Banana Bag, Dose: 1000 ml  Infusion Start Time: 1428  Infusion Stop Time: 1536   Promethazine 25 mg in NS, Dose: 1000 ml  Infusion Start Time: 1536  Infusion Stop Time: 1652  Post Infusion IV Care: Peripheral IV Discontinued  Discharge: Condition: Good, Destination: Home . AVS Declined  Performed by:  Adriana Mccallum, RN

## 2023-04-10 ENCOUNTER — Encounter (HOSPITAL_BASED_OUTPATIENT_CLINIC_OR_DEPARTMENT_OTHER): Payer: Self-pay | Admitting: Physical Therapy

## 2023-04-10 ENCOUNTER — Ambulatory Visit (HOSPITAL_BASED_OUTPATIENT_CLINIC_OR_DEPARTMENT_OTHER): Payer: Medicaid Other | Admitting: Physical Therapy

## 2023-04-10 DIAGNOSIS — M6281 Muscle weakness (generalized): Secondary | ICD-10-CM | POA: Diagnosis not present

## 2023-04-10 NOTE — Therapy (Signed)
 OUTPATIENT PHYSICAL THERAPY FEMALE PELVIC TREATMENT   Patient Name: Heather Malone MRN: [id number] DOB:1991/01/26, 33 y.o., female Today's Date: 04/10/2023  END OF SESSION:    Past Medical History:  Diagnosis Date   Anemia    Anxiety    Asthma    albuterol prn   Headache    migraines   Hx of trichomoniasis    Hx of varicella    Neck injury, subsequent encounter 07/26/2016   Placental abruption in third trimester 09/04/2014   Pre-eclampsia    Pregnancy induced hypertension    Subchorionic hemorrhage in third trimester 07/26/2014   Velamentous insertion of umbilical cord    Past Surgical History:  Procedure Laterality Date   TONSILLECTOMY     Patient Active Problem List   Diagnosis Date Noted   [redacted] weeks gestation of pregnancy 04/08/2023   Chlamydia infection affecting pregnancy in third trimester 04/08/2023   Migraine without aura and without status migrainosus, not intractable 03/06/2023   Rebound headache 03/06/2023   Pregnancy headache in third trimester 03/06/2023   Abdominal pain affecting pregnancy 02/11/2023   Hyperemesis 12/10/2022   Supervision of high-risk pregnancy 10/29/2022   History of severe pre-eclampsia 10/29/2022   Elevated blood pressure complicating pregnancy in first trimester, antepartum 10/01/2022   Anemia in pregnancy 08/05/2020   Alpha thalassemia silent carrier 08/02/2020   Chronic headaches 04/12/2020    PCP: Patient, No Pcp Per PCP - General  REFERRING PROVIDER: Bernerd Limbo, CNM  Ref Provider  REFERRING DIAG: R10.30 (ICD-10-CM) - Lower abdominal pain    THERAPY DIAG:  Muscle weakness (generalized)  Rationale for Evaluation and Treatment: Rehabilitation  ONSET DATE: 10/2022  SUBJECTIVE:                                                                                                                                                                                           SUBJECTIVE STATEMENT: Pt states she has rested  more over the last day.  Pain is only in Rt thigh today.   PAIN:  Are you having pain? Yes NPRS scale: 5/10- abdomen,  4/10 RLE to upper-thigh Pain location: see above Pain type: dull Pain description: intermittent and constant   Aggravating factors: sitting too long, walking too much Relieving factors: ice packs on backs, bending down  PRECAUTIONS: None  RED FLAGS: None   WEIGHT BEARING RESTRICTIONS: No  FALLS:  Has patient fallen in last 6 months? No  LIVING ENVIRONMENT: Lives with: lives with their family Lives in: House/apartment Stairs: No Has following equipment at home: None  OCCUPATION: desk job, benefits specialist  PLOF: Independent  PATIENT GOALS: relieve the pain  PERTINENT HISTORY:  Nothing relevant Sexual abuse: No  BOWEL MOVEMENT:n/a   URINATION: no issues  INTERCOURSE: no issues  PREGNANCY: Vaginal deliveries 2 Tearing No C-section deliveries 0 Currently pregnant Yes: 19 weeks  PROLAPSE: None   OBJECTIVE:  Note: Objective measures were completed at Evaluation unless otherwise noted.     COGNITION: Overall cognitive status: Within functional limits for tasks assessed     SENSATION: Light touch: Appears intact Proprioception: Appears intact  MUSCLE LENGTH: Hamstrings: Right 80 deg; Left 80 deg   LUMBAR SPECIAL TESTS:  Stork standing: Positive for weakness bilat    GAIT: Comments: antalgic, wide stance  POSTURE: rounded shoulders, forward head, increased lumbar lordosis, and anterior pelvic tilt  PELVIC ALIGNMENT: seems even  LUMBARAROM/PROM:  A/PROM A/PROM  eval  Flexion 50% difficult  Extension 50% difficult   LOWER EXTREMITY ROM: some limitations throughout d/t stiffness and pain  LOWER EXTREMITY MMT: at least 4/5 grossly overall    PALPATION:   General  abdomen- low tone                External Perineal Exam to be assessed as needed                             Internal Pelvic Floor to be assessed as  needed  Patient confirms identification and approves PT to assess internal pelvic floor and treatment No  PELVIC MMT:   MMT eval  Vaginal   Internal Anal Sphincter   External Anal Sphincter   Puborectalis   Diastasis Recti   (Blank rows = not tested)        TONE: Low tone abdomen High tone and trigger points lumbar paraspinals Pelvic girdle external pressure relieved pain ( serola belt trial, manual pressure)  PROLAPSE: To be assessed if needed  TODAY'S TREATMENT:                                                                                                                              [facility] Adult PT Treatment:                                                DATE: 04/10/23 Pt seen for aquatic therapy today.  Treatment took place in water 3.5-4.75 ft in depth at the [facility] pool. Temp of water was 91.  Pt entered/exited the pool via stairs in step-through pattern with bilat rail. ** pt arrived ambulating with RW  In 4+ ft of water:  * unsupported: Walking forward/ backward UE 3 laps, with RLE on down slope (facing hot tub) * side stepping R/L with arm addct/ abdct with rainbow hand floats x 3 laps total * UE on rainbow hand floats:  hip flex/ ext  2 x 5 each; hip abdct/  addct 2 x5 each; heel raises x 10; tricep push down with rainbow hand floats  at side x 10 each; TrA set with single rainbow hand float pulled down to thigh x 10 each * marching backward/forward with reciprocal row with UE on rainbow hand floats * R hip flexor stretch with UE on wall x 30s * standing back against wall, RLE (knee) supported on hollow noodle - nerve flossing with knee flex/ext x 10 * walking forward/ backward with reciprocal arm swing    04/02/23 Neuro re-ed- trial of amb with a cane, yoga ball pelvic rocks, cat/ cow There acts- education on perineal massage, review of progress, support belt    Last land session: DATE: 02/20/23  Manual- STM lumbar paraspinals, QL  and right  glutes  Therapeutic activities- trial of kinesiotaping- 4 strips herringbone pattern on abdomen Trial of sheet instead of belt- not tol well on abdomen                      HOME EXERCISE PROGRAM: [id number]  ASSESSMENT:  CLINICAL IMPRESSION: Continued relief with aquatic exercise. Good relief with nerve flossing in standing today.  Will continue to progress as tolerated.       OBJECTIVE IMPAIRMENTS: decreased activity tolerance, decreased knowledge of condition, difficulty walking, decreased strength, increased muscle spasms, impaired flexibility, impaired tone, improper body mechanics, and pain.   ACTIVITY LIMITATIONS: carrying, lifting, bending, standing, squatting, sleeping, stairs, transfers, bed mobility, locomotion level, and caring for others  PARTICIPATION LIMITATIONS: meal prep, cleaning, laundry, shopping, community activity, occupation, and yard work  PERSONAL FACTORS: Fitness and Time since onset of injury/illness/exacerbation are also affecting patient's functional outcome.   REHAB POTENTIAL: Good  CLINICAL DECISION MAKING: Stable/uncomplicated  EVALUATION COMPLEXITY: Low   GOALS: Goals reviewed with patient? Yes  SHORT TERM GOALS: Target date: 02/06/2023    Pt will be I with her HEP and demonstrate her exercises correctly Baseline: Goal status:met  2.  Pt will report max 4/10 pain with walking for 30 mins Baseline:  Goal status: not met ( in the pool - met)  3.  Pt will be able to lift her 2 yo child with good pressure management at least 50% of the time Baseline: unable/ lift restriction of 5# - 04/01/23 Goal status: deferred    LONG TERM GOALS: Target date: 04/03/2023 updated 05/28/2023    Pt will report max 2/10 low back and abdominal pain with 1 hour of walking  Baseline:  Goal status: INITIAL  2.  Pt will be I with advanced HEP and dem all exercises correctly Baseline:  Goal status: INITIAL  3.  Pt will be able to sleep at least 5 consecutive  hours at night without increased pain Baseline: 2.5 hrs - 04/01/23 Goal status: INITIAL   PLAN:  PT FREQUENCY: 1-2x/week  PT DURATION: 8 weeks  PLANNED INTERVENTIONS: 97110-Therapeutic exercises, 97530- Therapeutic activity, 97112- Neuromuscular re-education, 97535- Self Care, 30865- Manual therapy, [phone]- Electrical stimulation (manual), Patient/Family education, Taping, Dry Needling, Joint mobilization, Joint manipulation, Spinal manipulation, Spinal mobilization, Moist heat, and Biofeedback  PLAN FOR NEXT SESSION: cont pool therapy  Mayer Camel, PTA 04/10/23 2:31 PM [facility] [address] [place], [place], [address] Phone: [phone]   Fax:  [phone]

## 2023-04-15 ENCOUNTER — Ambulatory Visit

## 2023-04-15 VITALS — BP 122/80 | HR 100 | Temp 98.0°F | Resp 18 | Ht 62.0 in | Wt 180.2 lb

## 2023-04-15 DIAGNOSIS — R1110 Vomiting, unspecified: Secondary | ICD-10-CM

## 2023-04-15 MED ORDER — SODIUM CHLORIDE 0.9 % IV SOLN
25.0000 mg | INTRAVENOUS | Status: DC
  Administered 2023-04-15: 25 mg via INTRAVENOUS
  Filled 2023-04-15: qty 1

## 2023-04-15 MED ORDER — LACTATED RINGERS IV SOLN
Freq: Once | INTRAVENOUS | Status: AC
  Filled 2023-04-15: qty 10

## 2023-04-15 NOTE — Progress Notes (Signed)
 Diagnosis: Hyperemesis   Provider:  Chilton Greathouse MD  Procedure: IV Infusion  IV Type: Peripheral, IV Location: R Antecubital  Banana bag, Dose: 1000 ml  Infusion Start Time: 1454  Infusion Stop Time: 1603  Post Infusion IV Care: Peripheral IV Discontinued  Discharge: Condition: Good, Destination: Home . AVS Declined  Performed by:  Rico Ala, LPN   Diagnosis: Hyperemesis   Provider:  Chilton Greathouse MD  Procedure: IV Infusion  IV Type: Peripheral, IV Location: R Antecubital  Normal Saline (Phenergan), Dose: 1000 ml  Infusion Start Time: 1605  Infusion Stop Time: 1713  Post Infusion IV Care: Peripheral IV Discontinued  Discharge: Condition: Good, Destination: Home . AVS Declined  Performed by:  Rico Ala, LPN

## 2023-04-16 ENCOUNTER — Inpatient Hospital Stay (HOSPITAL_COMMUNITY)
Admission: AD | Admit: 2023-04-16 | Discharge: 2023-04-16 | Disposition: A | Discharge status: Home / Self Care | Payer: Self-pay | Attending: Family Medicine | Admitting: Family Medicine

## 2023-04-16 ENCOUNTER — Encounter (HOSPITAL_COMMUNITY): Payer: Self-pay | Admitting: Family Medicine

## 2023-04-16 ENCOUNTER — Encounter: Payer: Self-pay | Admitting: Family Medicine

## 2023-04-16 DIAGNOSIS — R42 Dizziness and giddiness: Secondary | ICD-10-CM | POA: Diagnosis not present

## 2023-04-16 DIAGNOSIS — O26893 Other specified pregnancy related conditions, third trimester: Secondary | ICD-10-CM | POA: Diagnosis not present

## 2023-04-16 DIAGNOSIS — O09293 Supervision of pregnancy with other poor reproductive or obstetric history, third trimester: Secondary | ICD-10-CM | POA: Insufficient documentation

## 2023-04-16 DIAGNOSIS — R519 Headache, unspecified: Secondary | ICD-10-CM | POA: Insufficient documentation

## 2023-04-16 DIAGNOSIS — R109 Unspecified abdominal pain: Secondary | ICD-10-CM

## 2023-04-16 DIAGNOSIS — Z0130 Encounter for examination of blood pressure without abnormal findings: Secondary | ICD-10-CM

## 2023-04-16 DIAGNOSIS — R030 Elevated blood-pressure reading, without diagnosis of hypertension: Secondary | ICD-10-CM | POA: Insufficient documentation

## 2023-04-16 DIAGNOSIS — Z3A33 33 weeks gestation of pregnancy: Secondary | ICD-10-CM | POA: Insufficient documentation

## 2023-04-16 LAB — URINALYSIS, ROUTINE W REFLEX MICROSCOPIC
Glucose, UA: NEGATIVE mg/dL
Hgb urine dipstick: NEGATIVE
Ketones, ur: NEGATIVE mg/dL
Nitrite: NEGATIVE
Protein, ur: 30 mg/dL — AB
Specific Gravity, Urine: 1.029 (ref 1.005–1.030)
pH: 6 (ref 5.0–8.0)

## 2023-04-16 LAB — COMPREHENSIVE METABOLIC PANEL
ALT: 8 U/L (ref 0–44)
AST: 13 U/L — ABNORMAL LOW (ref 15–41)
Albumin: 3.1 g/dL — ABNORMAL LOW (ref 3.5–5.0)
Alkaline Phosphatase: 38 U/L (ref 38–126)
Anion gap: 8 (ref 5–15)
BUN: <5 mg/dL — ABNORMAL LOW (ref 6–20)
CO2: 23 mmol/L (ref 22–32)
Calcium: 8.8 mg/dL — ABNORMAL LOW (ref 8.9–10.3)
Chloride: 106 mmol/L (ref 98–111)
Creatinine, Ser: 0.5 mg/dL (ref 0.44–1.00)
GFR, Estimated: >60 mL/min (ref >60)
Glucose, Bld: 91 mg/dL (ref 70–99)
Potassium: 4.3 mmol/L (ref 3.5–5.1)
Sodium: 137 mmol/L (ref 135–145)
Total Bilirubin: 0.3 mg/dL (ref 0.0–1.2)
Total Protein: 6.4 g/dL — ABNORMAL LOW (ref 6.5–8.1)

## 2023-04-16 LAB — CBC
HCT: 29.2 % — ABNORMAL LOW (ref 36.0–46.0)
Hemoglobin: 9.9 g/dL — ABNORMAL LOW (ref 12.0–15.0)
MCH: 28.9 pg (ref 26.0–34.0)
MCHC: 33.9 g/dL (ref 30.0–36.0)
MCV: 85.4 fL (ref 80.0–100.0)
Platelets: 236 10*3/uL (ref 150–400)
RBC: 3.42 MIL/uL — ABNORMAL LOW (ref 3.87–5.11)
RDW: 14.1 % (ref 11.5–15.5)
WBC: 6.3 10*3/uL (ref 4.0–10.5)
nRBC: 0 % (ref 0.0–0.2)

## 2023-04-16 LAB — PROTEIN / CREATININE RATIO, URINE
Creatinine, Urine: 236 mg/dL
Protein Creatinine Ratio: 0.06 mg/mg{creat} (ref 0.00–0.15)
Total Protein, Urine: 13 mg/dL

## 2023-04-16 MED ORDER — LACTATED RINGERS IV BOLUS
1000.0000 mL | Freq: Once | INTRAVENOUS | Status: AC
  Administered 2023-04-16: 1000 mL via INTRAVENOUS

## 2023-04-16 NOTE — MAU Note (Addendum)
 RN called lab to inquire about Urinalysis and protein creatinine ratio. Lab tech, Delice Bison, answered and informed RN that they will try to locate the sample and contact the RN if they are unable to locate.

## 2023-04-16 NOTE — MAU Provider Note (Signed)
 History     CSN: [id number]  Arrival date and time: 04/16/23 1719   Event Date/Time   First Provider Initiated Contact with Patient 04/16/23 1843      Chief Complaint  Patient presents with   Headache   Abdominal Pain   Hypertension   HPI  Heather Malone is a 33 y.o. female [id number] @ [redacted]w[redacted]d here in MAU with complaints of elevated BP readings at home. She reports she was in the infusion center yesterday and her BP was 140/90 then 120/70. History of severe pre E with previous pregnancy with delivery  at 34w, not taking ASA.  She reports occasional HA and visual floaters that come and go. She reports contraction like pain at times that also comes and goes. She has not taken anything for the pain. She denies vaginal bleeding.   Frequent visits to the MAU   Patient reports she is hoping to have the baby very soon.   OB History     Gravida  3   Para  2   Term  1   Preterm  1   AB  0   Living  2      SAB  0   IAB  0   Ectopic  0   Multiple  0   Live Births  2           Past Medical History:  Diagnosis Date   Anemia    Anxiety    Asthma    albuterol prn   Headache    migraines   Hx of trichomoniasis    Hx of varicella    Neck injury, subsequent encounter 07/26/2016   Placental abruption in third trimester 09/04/2014   Pre-eclampsia    Pregnancy induced hypertension    Subchorionic hemorrhage in third trimester 07/26/2014   Velamentous insertion of umbilical cord     Past Surgical History:  Procedure Laterality Date   TONSILLECTOMY      Family History  Problem Relation Age of Onset   Hypertension Mother    Hypertension Maternal Grandmother    Heart attack Maternal Grandmother    Hypertension Maternal Grandfather    Cancer Maternal Grandfather    Diabetes Paternal Grandmother     Social History   Tobacco Use   Smoking status: Former    Types: Cigars   Smokeless tobacco: Never  Vaping Use   Vaping status: Never Used  Substance  Use Topics   Alcohol use: Not Currently    Comment: weekends   Drug use: Not Currently    Types: Marijuana    Comment: none with + UPT    Allergies: No Known Allergies  Medications Prior to Admission  Medication Sig Dispense Refill Last Dose/Taking   Butalbital-APAP-Caffeine 50-325-40 MG capsule Take 1-2 capsules by mouth every 6 (six) hours as needed for headache. 30 capsule 1 04/16/2023 at 12:00 PM   cyclobenzaprine (FLEXERIL) 10 MG tablet Take 1 tablet (10 mg total) by mouth every 8 (eight) hours as needed for muscle spasms. 60 tablet 1 04/16/2023 at 12:00 PM   Doxylamine-Pyridoxine ER (BONJESTA) 20-20 MG TBCR Take 1 tablet by mouth at bedtime. 60 tablet 3 04/15/2023   gabapentin (NEURONTIN) 300 MG capsule Take 1 capsule (300 mg total) by mouth 3 (three) times daily as needed. 60 capsule 1 04/16/2023 at  2:00 PM   omeprazole (PRILOSEC) 40 MG capsule Take 1 capsule (40 mg total) by mouth daily. 30 capsule 4 04/15/2023   Prenatal Vit-Fe Fumarate-FA (MULTIVITAMIN-PRENATAL)  27-0.8 MG TABS tablet Take 1 tablet by mouth daily at 12 noon. 30 tablet 0 04/15/2023   promethazine (PHENERGAN) 12.5 MG suppository Place 1 suppository (12.5 mg total) rectally every 6 (six) hours as needed for nausea or vomiting. (Patient not taking: Reported on 03/25/2023) 12 each 0    Results for orders placed or performed during the hospital encounter of 04/16/23 (from the past 48 hours)  Urinalysis, Routine w reflex microscopic -Urine, Clean Catch     Status: Abnormal   Collection Time: 04/16/23  5:53 PM  Result Value Ref Range   Color, Urine YELLOW YELLOW   APPearance HAZY (A) CLEAR   Specific Gravity, Urine 1.029 1.005 - 1.030   pH 6.0 5.0 - 8.0   Glucose, UA NEGATIVE NEGATIVE mg/dL   Hgb urine dipstick NEGATIVE NEGATIVE   Bilirubin Urine SMALL (A) NEGATIVE   Ketones, ur NEGATIVE NEGATIVE mg/dL   Protein, ur 30 (A) NEGATIVE mg/dL   Nitrite NEGATIVE NEGATIVE   Leukocytes,Ua TRACE (A) NEGATIVE   RBC / HPF 0-5 0 -  5 RBC/hpf   WBC, UA 0-5 0 - 5 WBC/hpf   Bacteria, UA RARE (A) NONE SEEN   Squamous Epithelial / HPF 6-10 0 - 5 /HPF   Mucus PRESENT     Comment: Performed at [facility], [address] [place], [place] [address]  Protein / creatinine ratio, urine     Status: None   Collection Time: 04/16/23  6:08 PM  Result Value Ref Range   Creatinine, Urine 236 mg/dL   Total Protein, Urine 13 mg/dL    Comment: NO NORMAL RANGE ESTABLISHED FOR THIS TEST   Protein Creatinine Ratio 0.06 0.00 - 0.15 mg/mg[Cre]    Comment: Performed at [facility], [address] [place], [place] [address]  CBC     Status: Abnormal   Collection Time: 04/16/23  6:18 PM  Result Value Ref Range   WBC 6.3 4.0 - 10.5 K/uL   RBC 3.42 (L) 3.87 - 5.11 MIL/uL   Hemoglobin 9.9 (L) 12.0 - 15.0 g/dL   HCT 09.8 (L) 11.9 - 14.7 %   MCV 85.4 80.0 - 100.0 fL   MCH 28.9 26.0 - 34.0 pg   MCHC 33.9 30.0 - 36.0 g/dL   RDW 82.9 56.2 - 13.0 %   Platelets 236 150 - 400 K/uL   nRBC 0.0 0.0 - 0.2 %    Comment: Performed at [facility], [address] [place], [place] [address]  Comprehensive metabolic panel     Status: Abnormal   Collection Time: 04/16/23  6:18 PM  Result Value Ref Range   Sodium 137 135 - 145 mmol/L   Potassium 4.3 3.5 - 5.1 mmol/L   Chloride 106 98 - 111 mmol/L   CO2 23 22 - 32 mmol/L   Glucose, Bld 91 70 - 99 mg/dL    Comment: Glucose reference range applies only to samples taken after fasting for at least 8 hours.   BUN <5 (L) 6 - 20 mg/dL   Creatinine, Ser 4.69 0.44 - 1.00 mg/dL   Calcium 8.8 (L) 8.9 - 10.3 mg/dL   Total Protein 6.4 (L) 6.5 - 8.1 g/dL   Albumin 3.1 (L) 3.5 - 5.0 g/dL   AST 13 (L) 15 - 41 U/L   ALT 8 0 - 44 U/L   Alkaline Phosphatase 38 38 - 126 U/L   Total Bilirubin 0.3 0.0 - 1.2 mg/dL   GFR, Estimated >62 >  60 mL/min    Comment: (NOTE) Calculated using the CKD-EPI Creatinine Equation (2021)    Anion gap 8 5 - 15    Comment: Performed at [facility],  [address], [place], [place] [address]     Review of Systems  Constitutional:  Negative for fever.  Gastrointestinal:  Positive for abdominal pain.  Neurological:  Positive for dizziness, light-headedness and headaches.   Physical Exam   Blood pressure 112/74, pulse 91, temperature 98.2 F (36.8 C), temperature source Oral, resp. rate 14, last menstrual period 08/18/2022, SpO2 98%.  Patient Vitals for the past 24 hrs:  BP Temp Temp src Pulse Resp SpO2  04/16/23 1955 -- -- -- -- -- 98 %  04/16/23 1950 -- -- -- -- -- 99 %  04/16/23 1945 108/74 -- -- 84 -- 99 %  04/16/23 1940 -- -- -- -- -- 99 %  04/16/23 1935 -- -- -- -- -- 99 %  04/16/23 1930 111/80 -- -- 85 -- 98 %  04/16/23 1925 -- -- -- -- -- 98 %  04/16/23 1920 -- -- -- -- -- 99 %  04/16/23 1915 111/73 -- -- 81 -- --  04/16/23 1910 -- -- -- -- -- 99 %  04/16/23 1905 -- -- -- -- -- 98 %  04/16/23 1900 113/80 -- -- 89 -- 98 %  04/16/23 1855 -- -- -- -- -- 97 %  04/16/23 1850 -- -- -- -- -- 98 %  04/16/23 1845 119/83 -- -- 95 -- 98 %  04/16/23 1840 -- -- -- -- -- 98 %  04/16/23 1835 -- -- -- -- -- 98 %  04/16/23 1830 112/74 -- -- 91 -- 98 %  04/16/23 1825 -- -- -- -- -- 98 %  04/16/23 1820 -- -- -- -- -- 98 %  04/16/23 1815 -- -- -- -- -- 98 %  04/16/23 1810 -- -- -- -- -- 98 %  04/16/23 1805 -- -- -- -- -- 97 %  04/16/23 1800 (!) 81/66 -- -- 89 -- 96 %  04/16/23 1755 -- -- -- -- -- 96 %  04/16/23 1750 -- -- -- -- -- 96 %  04/16/23 1745 120/77 98.2 F (36.8 C) Oral 96 14 97 %     Physical Exam Constitutional:      General: She is not in acute distress.    Appearance: She is well-developed. She is not ill-appearing, toxic-appearing or diaphoretic.  HENT:     Head: Normocephalic.  Abdominal:     Palpations: Abdomen is soft.  Genitourinary:    Comments: Dilation: Closed Cervical Position: Posterior Exam by:: Blanche East NP  Neurological:     Mental Status: She is alert and oriented to person, place, and time.      GCS: GCS eye subscore is 4. GCS verbal subscore is 5. GCS motor subscore is 6.     Deep Tendon Reflexes: Reflexes normal.  Psychiatric:        Behavior: Behavior normal.    Fetal Tracing: Baseline: 130 bpm Variability: Moderate  Accelerations: 15x15 Decelerations: None Toco: UI  MAU Course  Procedures  MDM  LR bolus given due to dizziness feelings CBC, CMP,  PCR all stable.  Hgb up from 8.6-9.9 Offered patient medication for her HA and she prefers to take her medication at home.   Assessment and Plan   A:  1. Abdominal pain during pregnancy in third trimester   2. Blood pressure check   3. Dizziness  4. [redacted] weeks gestation of pregnancy      P:  DC home in stable condition Increase oral fluid intake  Return to MAU if symptoms worsen Preeclampsia discussed   Duane Lope, NP 04/16/2023 8:11 PM

## 2023-04-16 NOTE — MAU Note (Addendum)
..  Heather Malone is a 33 y.o. at [redacted]w[redacted]d here in MAU reporting: headache, visual floaters and elevated BP for the last few days. Also reports constant pelvic pressure and intermittent sharp shooting pain. Denies VB or LOF. +FM. Also reporting painful hemorrhoid that is bleeding.   Pain score: headache-7   pelvis-6 Vitals:   04/16/23 1745  BP: 120/77  Pulse: 96  Resp: 14  Temp: 98.2 F (36.8 C)  SpO2: 97%     FHT:140 Lab orders placed from triage:   UA

## 2023-04-17 ENCOUNTER — Ambulatory Visit (HOSPITAL_BASED_OUTPATIENT_CLINIC_OR_DEPARTMENT_OTHER): Payer: Self-pay | Admitting: Physical Therapy

## 2023-04-17 ENCOUNTER — Encounter (HOSPITAL_BASED_OUTPATIENT_CLINIC_OR_DEPARTMENT_OTHER): Payer: Self-pay | Admitting: Physical Therapy

## 2023-04-17 DIAGNOSIS — M6281 Muscle weakness (generalized): Secondary | ICD-10-CM

## 2023-04-17 NOTE — Therapy (Signed)
 OUTPATIENT PHYSICAL THERAPY FEMALE PELVIC TREATMENT   Patient Name: Heather Malone MRN: [id number] DOB:[birth year], 33 y.o., female Today's Date: 04/17/2023  END OF SESSION:  PT End of Session - 04/17/23 1212     Visit Number 11    Authorization Type [employer]    Authorization Time Period no auth required    PT Start Time 1153   pt arrived late   PT Stop Time 1231    PT Time Calculation (min) 38 min    Behavior During Therapy WFL for tasks assessed/performed              Past Medical History:  Diagnosis Date   Anemia    Anxiety    Asthma    albuterol prn   Headache    migraines   Hx of trichomoniasis    Hx of varicella    Neck injury, subsequent encounter 07/26/2016   Placental abruption in third trimester 09/04/2014   Pre-eclampsia    Pregnancy induced hypertension    Subchorionic hemorrhage in third trimester 07/26/2014   Velamentous insertion of umbilical cord    Past Surgical History:  Procedure Laterality Date   TONSILLECTOMY     Patient Active Problem List   Diagnosis Date Noted   Chlamydia infection affecting pregnancy in third trimester 04/08/2023   Migraine without aura and without status migrainosus, not intractable 03/06/2023   Rebound headache 03/06/2023   Pregnancy headache in third trimester 03/06/2023   Abdominal pain affecting pregnancy 02/11/2023   Hyperemesis 12/10/2022   Supervision of high-risk pregnancy 10/29/2022   History of severe pre-eclampsia 10/29/2022   Elevated blood pressure complicating pregnancy in first trimester, antepartum 10/01/2022   Anemia in pregnancy 08/05/2020   Alpha thalassemia silent carrier 08/02/2020    PCP: Patient, No Pcp Per PCP - General  REFERRING PROVIDER: Bernerd Limbo, CNM  Ref Provider  REFERRING DIAG: R10.30 (ICD-10-CM) - Lower abdominal pain    THERAPY DIAG:  Muscle weakness (generalized)  Rationale for Evaluation and Treatment: Rehabilitation  ONSET DATE: 10/2022  SUBJECTIVE:                                                                                                                                                                                            SUBJECTIVE STATEMENT: Pt states she is in preterm labor. Was having contractions every 5 min and went to hospital; contractions now only occasional.  She started having pain in R groin, no longer pain in RLE.Marland Kitchen   PAIN:  Are you having pain? Yes NPRS scale: 8/10 R groin Pain location: see above Pain type: dull Pain description: intermittent and constant  Aggravating factors: sitting too long, walking too much Relieving factors: ice packs on backs, bending down  PRECAUTIONS: None  RED FLAGS: None   WEIGHT BEARING RESTRICTIONS: No  FALLS:  Has patient fallen in last 6 months? No  LIVING ENVIRONMENT: Lives with: lives with their family Lives in: House/apartment Stairs: No Has following equipment at home: None  OCCUPATION: desk job, benefits specialist  PLOF: Independent  PATIENT GOALS: relieve the pain  PERTINENT HISTORY:  Nothing relevant Sexual abuse: No  BOWEL MOVEMENT:n/a   URINATION: no issues  INTERCOURSE: no issues  PREGNANCY: Vaginal deliveries 2 Tearing No C-section deliveries 0 Currently pregnant Yes: 19 weeks  PROLAPSE: None   OBJECTIVE:  Note: Objective measures were completed at Evaluation unless otherwise noted.     COGNITION: Overall cognitive status: Within functional limits for tasks assessed     SENSATION: Light touch: Appears intact Proprioception: Appears intact  MUSCLE LENGTH: Hamstrings: Right 80 deg; Left 80 deg   LUMBAR SPECIAL TESTS:  Stork standing: Positive for weakness bilat    GAIT: Comments: antalgic, wide stance  POSTURE: rounded shoulders, forward head, increased lumbar lordosis, and anterior pelvic tilt  PELVIC ALIGNMENT: seems even  LUMBARAROM/PROM:  A/PROM A/PROM  eval  Flexion 50% difficult  Extension 50%  difficult   LOWER EXTREMITY ROM: some limitations throughout d/t stiffness and pain  LOWER EXTREMITY MMT: at least 4/5 grossly overall    PALPATION:   General  abdomen- low tone                External Perineal Exam to be assessed as needed                             Internal Pelvic Floor to be assessed as needed  Patient confirms identification and approves PT to assess internal pelvic floor and treatment No  PELVIC MMT:   MMT eval  Vaginal   Internal Anal Sphincter   External Anal Sphincter   Puborectalis   Diastasis Recti   (Blank rows = not tested)        TONE: Low tone abdomen High tone and trigger points lumbar paraspinals Pelvic girdle external pressure relieved pain ( serola belt trial, manual pressure)  PROLAPSE: To be assessed if needed  TODAY'S TREATMENT:                                                                                                                              [facility] Adult PT Treatment:                                                DATE: 04/17/23 Pt seen for aquatic therapy today.  Treatment took place in water 3.5-4.75 ft in depth at the [facility] pool. Temp of water was  91.  Pt entered/exited the pool via stairs in step-through pattern with bilat rail. ** pt arrived ambulating with RW  In 4+ ft of water:  * unsupported: Walking forward/ backward UE 3 laps, with RLE on down slope (facing hot tub) * side stepping R/L with arm addct/ abdct with yellow hand floats x 3 laps total * marching backward/forward with reciprocal row with UE on rainbow hand floats * UE on wall:  hip ext  2 x 10 each; hip abdct- NOT tolerated; tricep push down with rainbow hand floats  at side x 15 each;  * return to walking forward/ backward  * standing back against wall, RLE (knee) supported on hollow noodle - not tolerated * side stepping with arm addct/ abdct (no pain)  * walking forward/ backward without support    Last land session: DATE:  02/20/23  Manual- STM lumbar paraspinals, QL  and right glutes  Therapeutic activities- trial of kinesiotaping- 4 strips herringbone pattern on abdomen Trial of sheet instead of belt- not tol well on abdomen                      HOME EXERCISE PROGRAM: [id number]  ASSESSMENT:  CLINICAL IMPRESSION: Pt reported gradual reduction of pain in R groin to 4/10 while in water.  Pain returned when attempted hip abdct with UE on wall and hip circles.    Will continue to progress as tolerated.       OBJECTIVE IMPAIRMENTS: decreased activity tolerance, decreased knowledge of condition, difficulty walking, decreased strength, increased muscle spasms, impaired flexibility, impaired tone, improper body mechanics, and pain.   ACTIVITY LIMITATIONS: carrying, lifting, bending, standing, squatting, sleeping, stairs, transfers, bed mobility, locomotion level, and caring for others  PARTICIPATION LIMITATIONS: meal prep, cleaning, laundry, shopping, community activity, occupation, and yard work  PERSONAL FACTORS: Fitness and Time since onset of injury/illness/exacerbation are also affecting patient's functional outcome.   REHAB POTENTIAL: Good  CLINICAL DECISION MAKING: Stable/uncomplicated  EVALUATION COMPLEXITY: Low   GOALS: Goals reviewed with patient? Yes  SHORT TERM GOALS: Target date: 02/06/2023    Pt will be I with her HEP and demonstrate her exercises correctly Baseline: Goal status:met  2.  Pt will report max 4/10 pain with walking for 30 mins Baseline:  Goal status: not met ( in the pool - met)  3.  Pt will be able to lift her 2 yo child with good pressure management at least 50% of the time Baseline: unable/ lift restriction of 5# - 04/01/23 Goal status: deferred    LONG TERM GOALS: Target date: 04/03/2023 updated 05/28/2023    Pt will report max 2/10 low back and abdominal pain with 1 hour of walking  Baseline:  Goal status: INITIAL  2.  Pt will be I with advanced HEP and dem all  exercises correctly Baseline:  Goal status: INITIAL  3.  Pt will be able to sleep at least 5 consecutive hours at night without increased pain Baseline: 2.5 hrs - 04/01/23 Goal status: INITIAL   PLAN:  PT FREQUENCY: 1-2x/week  PT DURATION: 8 weeks  PLANNED INTERVENTIONS: 97110-Therapeutic exercises, 97530- Therapeutic activity, 97112- Neuromuscular re-education, 97535- Self Care, 52841- Manual therapy, [phone]- Electrical stimulation (manual), Patient/Family education, Taping, Dry Needling, Joint mobilization, Joint manipulation, Spinal manipulation, Spinal mobilization, Moist heat, and Biofeedback  PLAN FOR NEXT SESSION: cont aquatic therapy  Mayer Camel, PTA 04/17/23 12:38 PM [facility] [address] [place], [place], [address] Phone: [phone]  Fax:  [phone]

## 2023-04-21 ENCOUNTER — Ambulatory Visit (HOSPITAL_BASED_OUTPATIENT_CLINIC_OR_DEPARTMENT_OTHER): Admitting: Physical Therapy

## 2023-04-22 ENCOUNTER — Other Ambulatory Visit (HOSPITAL_COMMUNITY)
Admission: RE | Admit: 2023-04-22 | Discharge: 2023-04-22 | Disposition: A | Discharge status: Home / Self Care | Source: Ambulatory Visit | Attending: Certified Nurse Midwife | Admitting: Certified Nurse Midwife

## 2023-04-22 ENCOUNTER — Ambulatory Visit: Payer: Medicaid Other | Attending: Obstetrics and Gynecology | Admitting: *Deleted

## 2023-04-22 ENCOUNTER — Ambulatory Visit (INDEPENDENT_AMBULATORY_CARE_PROVIDER_SITE_OTHER): Payer: Self-pay | Admitting: Certified Nurse Midwife

## 2023-04-22 ENCOUNTER — Other Ambulatory Visit: Payer: Self-pay | Admitting: *Deleted

## 2023-04-22 ENCOUNTER — Ambulatory Visit: Payer: Medicaid Other

## 2023-04-22 ENCOUNTER — Ambulatory Visit

## 2023-04-22 ENCOUNTER — Other Ambulatory Visit: Payer: Self-pay

## 2023-04-22 VITALS — BP 113/77 | HR 98 | Temp 98.4°F | Resp 18 | Wt 182.2 lb

## 2023-04-22 VITALS — BP 123/71 | HR 98

## 2023-04-22 VITALS — BP 118/81 | HR 101 | Wt 182.0 lb

## 2023-04-22 DIAGNOSIS — Z3A33 33 weeks gestation of pregnancy: Secondary | ICD-10-CM

## 2023-04-22 DIAGNOSIS — R1110 Vomiting, unspecified: Secondary | ICD-10-CM

## 2023-04-22 DIAGNOSIS — O99013 Anemia complicating pregnancy, third trimester: Secondary | ICD-10-CM | POA: Diagnosis not present

## 2023-04-22 DIAGNOSIS — Z8759 Personal history of other complications of pregnancy, childbirth and the puerperium: Secondary | ICD-10-CM

## 2023-04-22 DIAGNOSIS — O409XX0 Polyhydramnios, unspecified trimester, not applicable or unspecified: Secondary | ICD-10-CM

## 2023-04-22 DIAGNOSIS — O09293 Supervision of pregnancy with other poor reproductive or obstetric history, third trimester: Secondary | ICD-10-CM

## 2023-04-22 DIAGNOSIS — J302 Other seasonal allergic rhinitis: Secondary | ICD-10-CM | POA: Insufficient documentation

## 2023-04-22 DIAGNOSIS — Z202 Contact with and (suspected) exposure to infections with a predominantly sexual mode of transmission: Secondary | ICD-10-CM | POA: Insufficient documentation

## 2023-04-22 DIAGNOSIS — O09213 Supervision of pregnancy with history of pre-term labor, third trimester: Secondary | ICD-10-CM | POA: Insufficient documentation

## 2023-04-22 DIAGNOSIS — O210 Mild hyperemesis gravidarum: Secondary | ICD-10-CM

## 2023-04-22 DIAGNOSIS — D563 Thalassemia minor: Secondary | ICD-10-CM | POA: Diagnosis not present

## 2023-04-22 DIAGNOSIS — O403XX0 Polyhydramnios, third trimester, not applicable or unspecified: Secondary | ICD-10-CM | POA: Diagnosis not present

## 2023-04-22 DIAGNOSIS — O09899 Supervision of other high risk pregnancies, unspecified trimester: Secondary | ICD-10-CM

## 2023-04-22 DIAGNOSIS — Z3A36 36 weeks gestation of pregnancy: Secondary | ICD-10-CM | POA: Diagnosis not present

## 2023-04-22 DIAGNOSIS — Z148 Genetic carrier of other disease: Secondary | ICD-10-CM | POA: Diagnosis not present

## 2023-04-22 DIAGNOSIS — O402XX0 Polyhydramnios, second trimester, not applicable or unspecified: Secondary | ICD-10-CM | POA: Insufficient documentation

## 2023-04-22 DIAGNOSIS — O0993 Supervision of high risk pregnancy, unspecified, third trimester: Secondary | ICD-10-CM

## 2023-04-22 DIAGNOSIS — O09893 Supervision of other high risk pregnancies, third trimester: Secondary | ICD-10-CM

## 2023-04-22 MED ORDER — CETIRIZINE HCL 10 MG PO TABS: 10.0000 mg | ORAL_TABLET | Freq: Every day | ORAL | 3 refills | Status: DC

## 2023-04-22 MED ORDER — LACTATED RINGERS IV SOLN
Freq: Once | INTRAVENOUS | Status: AC
  Filled 2023-04-22: qty 10

## 2023-04-22 MED ORDER — SODIUM CHLORIDE 0.9 % IV SOLN
25.0000 mg | INTRAVENOUS | Status: DC
  Administered 2023-04-22: 25 mg via INTRAVENOUS
  Filled 2023-04-22: qty 1

## 2023-04-22 NOTE — Progress Notes (Signed)
 Diagnosis: Hyperemesis  Provider:  Chilton Greathouse MD  Procedure: IV Infusion  IV Type: Peripheral, IV Location: L Antecubital  Banana Bag, Dose: 1000 ml  Infusion Start Time: 1441  Infusion Stop Time: 1552   Promethazine 25mg  in NS, Dose: 1000 ml  Infusion Start Time: 1554  Infusion Stop Time: 1700  Post Infusion IV Care: Peripheral IV Discontinued  Discharge: Condition: Good, Destination: Home . AVS Declined  Performed by:  Loney Hering, LPN

## 2023-04-22 NOTE — Progress Notes (Signed)
   PRENATAL VISIT NOTE  Subjective:  Heather Malone is a 33 y.o. [id number] at [redacted]w[redacted]d being seen today for ongoing prenatal care.  She is currently monitored for the following issues for this high-risk pregnancy and has [redacted] weeks gestation of pregnancy; Alpha thalassemia silent carrier; Anemia in pregnancy; Elevated blood pressure complicating pregnancy in first trimester, antepartum; Supervision of high-risk pregnancy; History of severe pre-eclampsia; Hyperemesis; Abdominal pain affecting pregnancy; Migraine without aura and without status migrainosus, not intractable; Rebound headache; Pregnancy headache in third trimester; Chlamydia infection affecting pregnancy in third trimester; Seasonal allergies; and Possible exposure to STI on their problem list.  Patient reports  ongoing abdominal pain and occasional headaches, none present today.  .  Contractions: Irritability. Vag. Bleeding: None.  Movement: Present. Denies leaking of fluid.   The following portions of the patient's history were reviewed and updated as appropriate: allergies, current medications, past family history, past medical history, past social history, past surgical history and problem list.   Objective:   Vitals:   04/22/23 0940  BP: 118/81  Pulse: (!) 101  Weight: 82.6 kg    Fetal Status: Fetal Heart Rate (bpm): 147 Fundal Height: 34 cm Movement: Present     General:  Alert, oriented and cooperative. Patient is in no acute distress.  Skin: Skin is warm and dry. No rash noted.   Cardiovascular: Normal heart rate noted  Respiratory: Normal respiratory effort, no problems with respiration noted  Abdomen: Soft, gravid, appropriate for gestational age.  Pain/Pressure: Present     Pelvic: Cervical exam deferred        Extremities: Normal range of motion.  Edema: Trace  Mental Status: Normal mood and affect. Normal behavior. Normal judgment and thought content.   Assessment and Plan:  Pregnancy: G3P1102 at [redacted]w[redacted]d 1.  Supervision of high risk pregnancy in third trimester (Primary) - Patient reports ongoing abdominal discomfort, but is maintaining with PT and IV infusions.  2. [redacted] weeks gestation of pregnancy -Routine OB care  3. History of severe pre-eclampsia -Reports elevated Bp at IV infusion appointment. BP was WNL in MAU and at appointment today.  -Occasional headaches, taking Flexiril and Butalbital -Taking baby aspirin  4. Seasonal allergies - cetirizine (ZYRTEC) 10 MG tablet; Take 1 tablet (10 mg total) by mouth daily.  Dispense: 30 tablet; Refill: 3  5. Possible exposure to STI -Treated at last appointment, TOC today. - Cervicovaginal ancillary only( [place]) - RPR - HIV Antibody (routine testing w rflx) - Hepatitis B Surface AntiGEN  Preterm labor symptoms and general obstetric precautions including but not limited to vaginal bleeding, contractions, leaking of fluid and fetal movement were reviewed in detail with the patient. Please refer to After Visit Summary for other counseling recommendations.   Return in about 2 weeks (around 05/06/2023) for IN-PERSON, HOB.  Future Appointments  Date Time Provider Department Center  04/22/2023 11:30 AM [facility] US5 [facility]  04/22/2023  2:00 PM [facility]-CHAIR 6 [facility] None  04/23/2023 11:00 AM Carlson-Long, Lise Auer [facility]  04/27/2023 10:15 AM Carlson-Long, Victorino Dike L [facility]  04/29/2023  2:00 PM [facility]-CHAIR 4 [facility]IN[facility] None  05/06/2023 11:15 AM Bernerd Limbo, CNM [facility]  05/13/2023 11:15 AM Bernerd Limbo, CNM [facility]  05/20/2023 11:15 AM Bernerd Limbo, CNM [facility]  05/27/2023 11:15 AM Bernerd Limbo, CNM [facility]    Elige Ko, Student-MidWife

## 2023-04-23 ENCOUNTER — Ambulatory Visit (HOSPITAL_BASED_OUTPATIENT_CLINIC_OR_DEPARTMENT_OTHER): Payer: Self-pay | Admitting: Physical Therapy

## 2023-04-23 ENCOUNTER — Encounter (HOSPITAL_BASED_OUTPATIENT_CLINIC_OR_DEPARTMENT_OTHER): Payer: Self-pay | Admitting: Physical Therapy

## 2023-04-23 DIAGNOSIS — M6281 Muscle weakness (generalized): Secondary | ICD-10-CM

## 2023-04-23 LAB — CERVICOVAGINAL ANCILLARY ONLY
Bacterial Vaginitis (gardnerella): NEGATIVE
Chlamydia: POSITIVE — AB
Comment: NEGATIVE
Comment: NEGATIVE
Comment: NEGATIVE
Comment: NORMAL
Neisseria Gonorrhea: NEGATIVE
Trichomonas: NEGATIVE

## 2023-04-23 NOTE — Therapy (Signed)
 OUTPATIENT PHYSICAL THERAPY FEMALE PELVIC TREATMENT   Patient Name: Heather Malone MRN: [id number] DOB:09-15-1990, 33 y.o., female Today's Date: 04/23/2023  END OF SESSION:  PT End of Session - 04/23/23 1120     Visit Number 12    Authorization Type [employer] Medicaid    Authorization Time Period no auth required    PT Start Time 1107   pt arrived late to pool area   PT Stop Time 1145    PT Time Calculation (min) 38 min    Behavior During Therapy [facility] for tasks assessed/performed              Past Medical History:  Diagnosis Date   Anemia    Anxiety    Asthma    albuterol prn   Headache    migraines   Hx of trichomoniasis    Hx of varicella    Neck injury, subsequent encounter 07/26/2016   Placental abruption in third trimester 09/04/2014   Pre-eclampsia    Pregnancy induced hypertension    Subchorionic hemorrhage in third trimester 07/26/2014   Velamentous insertion of umbilical cord    Past Surgical History:  Procedure Laterality Date   TONSILLECTOMY     Patient Active Problem List   Diagnosis Date Noted   Seasonal allergies 04/22/2023   Possible exposure to STI 04/22/2023   Chlamydia infection affecting pregnancy in third trimester 04/08/2023   Migraine without aura and without status migrainosus, not intractable 03/06/2023   Rebound headache 03/06/2023   Pregnancy headache in third trimester 03/06/2023   Abdominal pain affecting pregnancy 02/11/2023   Hyperemesis 12/10/2022   Supervision of high-risk pregnancy 10/29/2022   History of severe pre-eclampsia 10/29/2022   Elevated blood pressure complicating pregnancy in first trimester, antepartum 10/01/2022   Anemia in pregnancy 08/05/2020   Alpha thalassemia silent carrier 08/02/2020   [redacted] weeks gestation of pregnancy     PCP: Patient, No Pcp Per PCP - General  REFERRING PROVIDER: Bernerd Limbo, CNM  Ref Provider  REFERRING DIAG: R10.30 (ICD-10-CM) - Lower abdominal pain    THERAPY DIAG:   Muscle weakness (generalized)  Rationale for Evaluation and Treatment: Rehabilitation  ONSET DATE: 10/2022  SUBJECTIVE:                                                                                                                                                                                           SUBJECTIVE STATEMENT: Pt states she is measuring [redacted]wks pregnant, 6# baby, per recent visit to dr.  She is hoping she can move up her induction to 4/17.   PAIN:  Are you having pain? Yes NPRS scale:  8/10 R groin Pain location: see above Pain type: dull Pain description: intermittent and constant   Aggravating factors: sitting too long, walking too much Relieving factors: ice packs on backs, bending down  PRECAUTIONS: None  RED FLAGS: None   WEIGHT BEARING RESTRICTIONS: No  FALLS:  Has patient fallen in last 6 months? No  LIVING ENVIRONMENT: Lives with: lives with their family Lives in: House/apartment Stairs: No Has following equipment at home: None  OCCUPATION: desk job, benefits specialist  PLOF: Independent  PATIENT GOALS: relieve the pain  PERTINENT HISTORY:  Nothing relevant Sexual abuse: No  BOWEL MOVEMENT:n/a   URINATION: no issues  INTERCOURSE: no issues  PREGNANCY: Vaginal deliveries 2 Tearing No C-section deliveries 0 Currently pregnant Yes: 19 weeks  PROLAPSE: None   OBJECTIVE:  Note: Objective measures were completed at Evaluation unless otherwise noted.     COGNITION: Overall cognitive status: Within functional limits for tasks assessed     SENSATION: Light touch: Appears intact Proprioception: Appears intact  MUSCLE LENGTH: Hamstrings: Right 80 deg; Left 80 deg   LUMBAR SPECIAL TESTS:  Stork standing: Positive for weakness bilat    GAIT: Comments: antalgic, wide stance  POSTURE: rounded shoulders, forward head, increased lumbar lordosis, and anterior pelvic tilt  PELVIC ALIGNMENT: seems  even  LUMBARAROM/PROM:  A/PROM A/PROM  eval  Flexion 50% difficult  Extension 50% difficult   LOWER EXTREMITY ROM: some limitations throughout d/t stiffness and pain  LOWER EXTREMITY MMT: at least 4/5 grossly overall    PALPATION:   General  abdomen- low tone                External Perineal Exam to be assessed as needed                             Internal Pelvic Floor to be assessed as needed  Patient confirms identification and approves PT to assess internal pelvic floor and treatment No  PELVIC MMT:   MMT eval  Vaginal   Internal Anal Sphincter   External Anal Sphincter   Puborectalis   Diastasis Recti   (Blank rows = not tested)        TONE: Low tone abdomen High tone and trigger points lumbar paraspinals Pelvic girdle external pressure relieved pain ( serola belt trial, manual pressure)  PROLAPSE: To be assessed if needed  TODAY'S TREATMENT:                                                                                                                              [facility] Adult PT Treatment:                                                DATE: 04/23/23 Pt seen for aquatic therapy today.  Treatment  took place in water 3.5-4.75 ft in depth at the [facility] pool. Temp of water was 91.  Pt entered/exited the pool via stairs in step-through pattern with bilat rail. ** pt arrived ambulating with RW  In 4+ ft of water:  * unsupported: Walking forward/ backward UE 3 laps, with RLE on down slope (facing hot tub) * side stepping R/L with arm addct/ abdct with yellow hand floats x 3 laps total * marching forward/ backward * UE on wall:  hip ext lunge stretch for ant hip x 15s x 2 each LE;   * tricep push down with yellow hand floats  at side 2 x5 each;  * walking backwards with bilat rainbow -> yellow hand floats at side * wide stance with side to side mini lunge; hip circles (like hula hoop- only clockwise)  * side stepping with arm addct/ abdct (no pain)      Last land session: DATE: 02/20/23  Manual- STM lumbar paraspinals, QL  and right glutes  Therapeutic activities- trial of kinesiotaping- 4 strips herringbone pattern on abdomen Trial of sheet instead of belt- not tol well on abdomen                      HOME EXERCISE PROGRAM: [id number]  ASSESSMENT:  CLINICAL IMPRESSION: Pt currently [redacted] wks pregnant.  Pt reported gradual reduction of pain in R groin to 5/10 while in water. Side stepping L continues to irritate R hip, as well as hip abdct; smaller steps for side stepping without resistance of hand floats improves tolerance .    Will continue to progress as tolerated.       OBJECTIVE IMPAIRMENTS: decreased activity tolerance, decreased knowledge of condition, difficulty walking, decreased strength, increased muscle spasms, impaired flexibility, impaired tone, improper body mechanics, and pain.   ACTIVITY LIMITATIONS: carrying, lifting, bending, standing, squatting, sleeping, stairs, transfers, bed mobility, locomotion level, and caring for others  PARTICIPATION LIMITATIONS: meal prep, cleaning, laundry, shopping, community activity, occupation, and yard work  PERSONAL FACTORS: Fitness and Time since onset of injury/illness/exacerbation are also affecting patient's functional outcome.   REHAB POTENTIAL: Good  CLINICAL DECISION MAKING: Stable/uncomplicated  EVALUATION COMPLEXITY: Low   GOALS: Goals reviewed with patient? Yes  SHORT TERM GOALS: Target date: 02/06/2023    Pt will be I with her HEP and demonstrate her exercises correctly Baseline: Goal status:met  2.  Pt will report max 4/10 pain with walking for 30 mins Baseline:  Goal status: not met ( in the pool - met)  3.  Pt will be able to lift her 2 yo child with good pressure management at least 50% of the time Baseline: unable/ lift restriction of 5# - 04/01/23 Goal status: deferred    LONG TERM GOALS: Target date: 04/03/2023 updated 05/28/2023    Pt will report  max 2/10 low back and abdominal pain with 1 hour of walking  Baseline:  Goal status: INITIAL  2.  Pt will be I with advanced HEP and dem all exercises correctly Baseline:  Goal status: INITIAL  3.  Pt will be able to sleep at least 5 consecutive hours at night without increased pain Baseline: 2.5 hrs - 04/01/23 Goal status: INITIAL   PLAN:  PT FREQUENCY: 1-2x/week  PT DURATION: 8 weeks  PLANNED INTERVENTIONS: 97110-Therapeutic exercises, 97530- Therapeutic activity, 97112- Neuromuscular re-education, 97535- Self Care, 54098- Manual therapy, [phone]- Electrical stimulation (manual), Patient/Family education, Taping, Dry Needling, Joint mobilization, Joint manipulation, Spinal manipulation, Spinal mobilization, Moist heat,  and Biofeedback  PLAN FOR NEXT SESSION: cont aquatic therapy  Mayer Camel, PTA 04/23/23 11:42 AM [facility] [address] [place], [place], [address] Phone: [phone]   Fax:  [phone]

## 2023-04-24 ENCOUNTER — Telehealth: Payer: Self-pay | Admitting: *Deleted

## 2023-04-24 ENCOUNTER — Encounter: Payer: Self-pay | Admitting: Certified Nurse Midwife

## 2023-04-24 ENCOUNTER — Encounter: Payer: Self-pay | Admitting: *Deleted

## 2023-04-24 LAB — HIV ANTIBODY (ROUTINE TESTING W REFLEX): HIV Screen 4th Generation wRfx: NONREACTIVE

## 2023-04-24 LAB — HEPATITIS B SURFACE ANTIGEN: Hepatitis B Surface Ag: NEGATIVE

## 2023-04-24 LAB — RPR: RPR Ser Ql: NONREACTIVE

## 2023-04-24 NOTE — Telephone Encounter (Addendum)
 Received positive STD screen for Chlamydia from 04/22/23. I called patient to notify and she verifies she tested positive before and received treatment in our office 3/ 12/25. I explained we do not usually retest until 3 weeks so I will check with provider to see if she needs to be retreated now or wait until next recheck. I also asked if she was aware her partner should be treated and should avoid intercourse until both treated. She verifies she is aware. Will route to provider. Confidential Communicable disease report completed and sent to [facility] per protocol.  Nancy Fetter

## 2023-04-27 ENCOUNTER — Ambulatory Visit (HOSPITAL_BASED_OUTPATIENT_CLINIC_OR_DEPARTMENT_OTHER): Payer: Self-pay | Admitting: Physical Therapy

## 2023-04-27 DIAGNOSIS — M6281 Muscle weakness (generalized): Secondary | ICD-10-CM

## 2023-04-27 NOTE — Therapy (Signed)
 OUTPATIENT PHYSICAL THERAPY FEMALE PELVIC TREATMENT   Patient Name: Heather Malone MRN: [id number] DOB:07/17/1990, 33 y.o., female Today's Date: 04/27/2023  END OF SESSION:  PT End of Session - 04/27/23 1055     Visit Number 13    Authorization Type [employer] Medicaid    Authorization Time Period no auth required    PT Start Time 1018    PT Stop Time 1056    PT Time Calculation (min) 38 min    Activity Tolerance Patient limited by pain    Behavior During Therapy WFL for tasks assessed/performed               Past Medical History:  Diagnosis Date   Anemia    Anxiety    Asthma    albuterol prn   Headache    migraines   Hx of trichomoniasis    Hx of varicella    Neck injury, subsequent encounter 07/26/2016   Placental abruption in third trimester 09/04/2014   Pre-eclampsia    Pregnancy induced hypertension    Subchorionic hemorrhage in third trimester 07/26/2014   Velamentous insertion of umbilical cord    Past Surgical History:  Procedure Laterality Date   TONSILLECTOMY     Patient Active Problem List   Diagnosis Date Noted   Seasonal allergies 04/22/2023   Possible exposure to STI 04/22/2023   Chlamydia infection affecting pregnancy in third trimester 04/08/2023   Migraine without aura and without status migrainosus, not intractable 03/06/2023   Rebound headache 03/06/2023   Pregnancy headache in third trimester 03/06/2023   Abdominal pain affecting pregnancy 02/11/2023   Hyperemesis 12/10/2022   Supervision of high-risk pregnancy 10/29/2022   History of severe pre-eclampsia 10/29/2022   Elevated blood pressure complicating pregnancy in first trimester, antepartum 10/01/2022   Anemia in pregnancy 08/05/2020   Alpha thalassemia silent carrier 08/02/2020   [redacted] weeks gestation of pregnancy     PCP: Patient, No Pcp Per PCP - General  REFERRING PROVIDER: Bernerd Limbo, CNM  Ref Provider  REFERRING DIAG: R10.30 (ICD-10-CM) - Lower abdominal pain     THERAPY DIAG:  No diagnosis found.  Rationale for Evaluation and Treatment: Rehabilitation  ONSET DATE: 10/2022  SUBJECTIVE:                                                                                                                                                                                           SUBJECTIVE STATEMENT: Pt states she is measuring [redacted]wks pregnant, 6# baby, per recent visit to dr.  She is hoping she can move up her induction to 4/17.   PAIN:  Are you having pain?  Yes NPRS scale: 8/10 R groin Pain location: see above Pain type: dull Pain description: intermittent and constant   Aggravating factors: sitting too long, walking too much Relieving factors: ice packs on backs, bending down  PRECAUTIONS: None  RED FLAGS: None   WEIGHT BEARING RESTRICTIONS: No  FALLS:  Has patient fallen in last 6 months? No  LIVING ENVIRONMENT: Lives with: lives with their family Lives in: House/apartment Stairs: No Has following equipment at home: None  OCCUPATION: desk job, benefits specialist  PLOF: Independent  PATIENT GOALS: relieve the pain  PERTINENT HISTORY:  Nothing relevant Sexual abuse: No  BOWEL MOVEMENT:n/a   URINATION: no issues  INTERCOURSE: no issues  PREGNANCY: Vaginal deliveries 2 Tearing No C-section deliveries 0 Currently pregnant Yes: 19 weeks  PROLAPSE: None   OBJECTIVE:  Note: Objective measures were completed at Evaluation unless otherwise noted.     COGNITION: Overall cognitive status: Within functional limits for tasks assessed     SENSATION: Light touch: Appears intact Proprioception: Appears intact  MUSCLE LENGTH: Hamstrings: Right 80 deg; Left 80 deg   LUMBAR SPECIAL TESTS:  Stork standing: Positive for weakness bilat    GAIT: Comments: antalgic, wide stance  POSTURE: rounded shoulders, forward head, increased lumbar lordosis, and anterior pelvic tilt  PELVIC ALIGNMENT: seems  even  LUMBARAROM/PROM:  A/PROM A/PROM  eval  Flexion 50% difficult  Extension 50% difficult   LOWER EXTREMITY ROM: some limitations throughout d/t stiffness and pain  LOWER EXTREMITY MMT: at least 4/5 grossly overall    PALPATION:   General  abdomen- low tone                External Perineal Exam to be assessed as needed                             Internal Pelvic Floor to be assessed as needed  Patient confirms identification and approves PT to assess internal pelvic floor and treatment No  PELVIC MMT:   MMT eval  Vaginal   Internal Anal Sphincter   External Anal Sphincter   Puborectalis   Diastasis Recti   (Blank rows = not tested)        TONE: Low tone abdomen High tone and trigger points lumbar paraspinals Pelvic girdle external pressure relieved pain ( serola belt trial, manual pressure)  PROLAPSE: To be assessed if needed  TODAY'S TREATMENT:                                                                                                                              [facility] Adult PT Treatment:                                                DATE: 04/27/23 Pt seen for aquatic therapy  today.  Treatment took place in water 3.5-4.75 ft in depth at the [facility] pool. Temp of water was 91.  Pt entered/exited the pool via stairs in step-through pattern with bilat rail. ** pt arrived via transport chair  In 4+ ft of water:  * unsupported: Walking forward/ backward, with RLE on down slope (facing hot tub) * UE On wall:  hip abdct / addct (small range) and hip ext (small range) * side stepping R only * walking forward/ backward with row motion with rainbow hand floats  * UE on wall:  hip ext lunge stretch for ant hip x 15s x 2 each LE;   * tricep push down with rainbow hand floats and  arm addct/ abdct with rainbow hand floats  * wide stance with side to side mini lunge; hip circles (like hula hoop- only clockwise) *walking backwards with bilat rainbow hand  floats at side      Last land session: DATE: 02/20/23  Manual- STM lumbar paraspinals, QL  and right glutes  Therapeutic activities- trial of kinesiotaping- 4 strips herringbone pattern on abdomen Trial of sheet instead of belt- not tol well on abdomen                      HOME EXERCISE PROGRAM: [id number]  ASSESSMENT:  CLINICAL IMPRESSION: Pt currently [redacted] wks pregnant.  Pt reported slight reduction of pain in R groin if standing still in chest deep water completing UE exercises. She reported continued pain in R groin into RLE with walking. Good tolerance to keep Side stepping to the R instead of both directions.     Will continue to progress as tolerated.       OBJECTIVE IMPAIRMENTS: decreased activity tolerance, decreased knowledge of condition, difficulty walking, decreased strength, increased muscle spasms, impaired flexibility, impaired tone, improper body mechanics, and pain.   ACTIVITY LIMITATIONS: carrying, lifting, bending, standing, squatting, sleeping, stairs, transfers, bed mobility, locomotion level, and caring for others  PARTICIPATION LIMITATIONS: meal prep, cleaning, laundry, shopping, community activity, occupation, and yard work  PERSONAL FACTORS: Fitness and Time since onset of injury/illness/exacerbation are also affecting patient's functional outcome.   REHAB POTENTIAL: Good  CLINICAL DECISION MAKING: Stable/uncomplicated  EVALUATION COMPLEXITY: Low   GOALS: Goals reviewed with patient? Yes  SHORT TERM GOALS: Target date: 02/06/2023    Pt will be I with her HEP and demonstrate her exercises correctly Baseline: Goal status:met  2.  Pt will report max 4/10 pain with walking for 30 mins Baseline:  Goal status: not met ( in the pool - met)  3.  Pt will be able to lift her 2 yo child with good pressure management at least 50% of the time Baseline: unable/ lift restriction of 5# - 04/01/23 Goal status: deferred    LONG TERM GOALS: Target date:  04/03/2023 updated 05/28/2023    Pt will report max 2/10 low back and abdominal pain with 1 hour of walking  Baseline: can walk 45 min in water with pain remaining unchanged (6-9/10)  Goal status:In progress 04/27/23  2.  Pt will be I with advanced HEP and dem all exercises correctly Baseline:  Goal status: INITIAL  3.  Pt will be able to sleep at least 5 consecutive hours at night without increased pain Baseline: 3 hrs - 04/27/23 Goal status: INITIAL   PLAN:  PT FREQUENCY: 1-2x/week  PT DURATION: 8 weeks  PLANNED INTERVENTIONS: 97110-Therapeutic exercises, 97530- Therapeutic activity, 97112- Neuromuscular re-education, 97535- Self Care, 54098- Manual  therapy, [phone]- Electrical stimulation (manual), Patient/Family education, Taping, Dry Needling, Joint mobilization, Joint manipulation, Spinal manipulation, Spinal mobilization, Moist heat, and Biofeedback  PLAN FOR NEXT SESSION: cont aquatic therapy   Mayer Camel, PTA 04/27/23 10:55 AM [facility] [address] [place], [place], [address] Phone: [phone]   Fax:  [phone]

## 2023-04-28 ENCOUNTER — Ambulatory Visit (HOSPITAL_BASED_OUTPATIENT_CLINIC_OR_DEPARTMENT_OTHER): Attending: Obstetrics and Gynecology | Admitting: Physical Therapy

## 2023-04-28 ENCOUNTER — Encounter (HOSPITAL_BASED_OUTPATIENT_CLINIC_OR_DEPARTMENT_OTHER): Payer: Self-pay | Admitting: Physical Therapy

## 2023-04-28 DIAGNOSIS — M6281 Muscle weakness (generalized): Secondary | ICD-10-CM | POA: Insufficient documentation

## 2023-04-28 NOTE — Therapy (Addendum)
 OUTPATIENT PHYSICAL THERAPY FEMALE PELVIC TREATMENT/ later date discharge   Patient Name: Heather Malone MRN: [id number] DOB:1990-07-11, 33 y.o., female Today's Date: 04/28/2023  END OF SESSION:  PT End of Session - 04/28/23 1021     Visit Number 14    Authorization Type [employer]    Authorization Time Period no auth required    PT Start Time 1017    PT Stop Time 1055    PT Time Calculation (min) 38 min    Activity Tolerance Patient limited by pain    Behavior During Therapy WFL for tasks assessed/performed               Past Medical History:  Diagnosis Date   Anemia    Anxiety    Asthma    albuterol  prn   Headache    migraines   Hx of trichomoniasis    Hx of varicella    Neck injury, subsequent encounter 07/26/2016   Placental abruption in third trimester 09/04/2014   Pre-eclampsia    Pregnancy induced hypertension    Subchorionic hemorrhage in third trimester 07/26/2014   Velamentous insertion of umbilical cord    Past Surgical History:  Procedure Laterality Date   TONSILLECTOMY     Patient Active Problem List   Diagnosis Date Noted   Seasonal allergies 04/22/2023   Possible exposure to STI 04/22/2023   Chlamydia infection affecting pregnancy in third trimester 04/08/2023   Migraine without aura and without status migrainosus, not intractable 03/06/2023   Rebound headache 03/06/2023   Pregnancy headache in third trimester 03/06/2023   Abdominal pain affecting pregnancy 02/11/2023   Hyperemesis 12/10/2022   Supervision of high-risk pregnancy 10/29/2022   History of severe pre-eclampsia 10/29/2022   Elevated blood pressure complicating pregnancy in first trimester, antepartum 10/01/2022   Anemia in pregnancy 08/05/2020   Alpha thalassemia silent carrier 08/02/2020   [redacted] weeks gestation of pregnancy     PCP: Patient, No Pcp Per PCP - General  REFERRING PROVIDER: Vannie Cornell SAUNDERS, CNM  Ref Provider  REFERRING DIAG: R10.30 (ICD-10-CM) - Lower  abdominal pain    THERAPY DIAG:  Muscle weakness (generalized)  Rationale for Evaluation and Treatment: Rehabilitation  ONSET DATE: 10/2022  SUBJECTIVE:                                                                                                                                                                                           SUBJECTIVE STATEMENT: Pt reports.   PAIN:  Are you having pain? Yes NPRS scale: 8/10 R groin Pain location: see above Pain type: dull Pain description: intermittent and constant   Aggravating  factors: sitting too long, walking too much Relieving factors: ice packs on backs, bending down  PRECAUTIONS: None  RED FLAGS: None   WEIGHT BEARING RESTRICTIONS: No  FALLS:  Has patient fallen in last 6 months? No  LIVING ENVIRONMENT: Lives with: lives with their family Lives in: House/apartment Stairs: No Has following equipment at home: None  OCCUPATION: desk job, benefits specialist  PLOF: Independent  PATIENT GOALS: relieve the pain  PERTINENT HISTORY:  Nothing relevant Sexual abuse: No  BOWEL MOVEMENT:n/a   URINATION: no issues  INTERCOURSE: no issues  PREGNANCY: Vaginal deliveries 2 Tearing No C-section deliveries 0 Currently pregnant Yes: 19 weeks  PROLAPSE: None   OBJECTIVE:  Note: Objective measures were completed at Evaluation unless otherwise noted.     COGNITION: Overall cognitive status: Within functional limits for tasks assessed     SENSATION: Light touch: Appears intact Proprioception: Appears intact  MUSCLE LENGTH: Hamstrings: Right 80 deg; Left 80 deg   LUMBAR SPECIAL TESTS:  Stork standing: Positive for weakness bilat    GAIT: Comments: antalgic, wide stance  POSTURE: rounded shoulders, forward head, increased lumbar lordosis, and anterior pelvic tilt  PELVIC ALIGNMENT: seems even  LUMBARAROM/PROM:  A/PROM A/PROM  eval  Flexion 50% difficult  Extension 50% difficult   LOWER  EXTREMITY ROM: some limitations throughout d/t stiffness and pain  LOWER EXTREMITY MMT: at least 4/5 grossly overall    PALPATION:   General  abdomen- low tone                External Perineal Exam to be assessed as needed                             Internal Pelvic Floor to be assessed as needed  Patient confirms identification and approves PT to assess internal pelvic floor and treatment No  PELVIC MMT:   MMT eval  Vaginal   Internal Anal Sphincter   External Anal Sphincter   Puborectalis   Diastasis Recti   (Blank rows = not tested)        TONE: Low tone abdomen High tone and trigger points lumbar paraspinals Pelvic girdle external pressure relieved pain ( serola belt trial, manual pressure)  PROLAPSE: To be assessed if needed  TODAY'S TREATMENT:                                                                                                                              [facility] Adult PT Treatment:                                                DATE: 04/28/23 Pt seen for aquatic therapy today.  Treatment took place in water 3.5-4.75 ft in depth at the [facility] pool. Temp of water was 91.  Pt entered/exited the pool via stairs in step-through pattern with bilat rail. ** pt arrived via transport chair  In 4+ ft of water:  * unsupported: Walking forward/ backward, with RLE on down slope (facing hot tub) * UE On wall:  hip abdct / addct (small range) and hip ext (small range) * side stepping R only * walking forward/ backward with row motion with rainbow hand floats  * UE on wall:  hip ext lunge stretch for ant hip x 15s x 2 each LE;   * tricep push down with rainbow hand floats and  arm addct/ abdct with rainbow hand floats  *pelvic tilting and hip hiking seated on yellow noodle 3.60ft * wide stance with side to side mini lunge; hip circles (like hula hoop- only clockwise) *marching forward and back *walking backwards with bilat rainbow hand floats at side  Pt  requires the buoyancy and hydrostatic pressure of water for support, and to offload joints by unweighting joint load by at least 50 % in navel deep water and by at least 75-80% in chest to neck deep water.  Viscosity of the water is needed for resistance of strengthening. Water current perturbations provides challenge to standing balance requiring increased core activation.      Last land session: DATE: 02/20/23  Manual- STM lumbar paraspinals, QL  and right glutes  Therapeutic activities- trial of kinesiotaping- 4 strips herringbone pattern on abdomen Trial of sheet instead of belt- not tol well on abdomen                      HOME EXERCISE PROGRAM: [id number]  ASSESSMENT:  CLINICAL IMPRESSION: Pt currently 34 wks 6 days pregnant.  She continues to have 8/10 pain in right groin area.  Reports good relief once submerged then for a few hours afterwards. Directed through exercises adding pelvic tilting and hip hiking seated on noodle which is tolerated well.  Full reduction in groin pain by reported today while submerged. Goals ongoing    OBJECTIVE IMPAIRMENTS: decreased activity tolerance, decreased knowledge of condition, difficulty walking, decreased strength, increased muscle spasms, impaired flexibility, impaired tone, improper body mechanics, and pain.   ACTIVITY LIMITATIONS: carrying, lifting, bending, standing, squatting, sleeping, stairs, transfers, bed mobility, locomotion level, and caring for others  PARTICIPATION LIMITATIONS: meal prep, cleaning, laundry, shopping, community activity, occupation, and yard work  PERSONAL FACTORS: Fitness and Time since onset of injury/illness/exacerbation are also affecting patient's functional outcome.   REHAB POTENTIAL: Good  CLINICAL DECISION MAKING: Stable/uncomplicated  EVALUATION COMPLEXITY: Low   GOALS: Goals reviewed with patient? Yes  SHORT TERM GOALS: Target date: 02/06/2023    Pt will be I with her HEP and demonstrate her  exercises correctly Baseline: Goal status:met  2.  Pt will report max 4/10 pain with walking for 30 mins Baseline:  Goal status: not met ( in the pool - met)  3.  Pt will be able to lift her 2 yo child with good pressure management at least 50% of the time Baseline: unable/ lift restriction of 5# - 04/01/23 Goal status: deferred    LONG TERM GOALS: Target date: 04/03/2023 updated 05/28/2023    Pt will report max 2/10 low back and abdominal pain with 1 hour of walking  Baseline: can walk 45 min in water with pain remaining unchanged (6-9/10)  Goal status:In progress 04/27/23  2.  Pt will be I with advanced HEP and dem all exercises correctly Baseline:  Goal status: INITIAL  3.  Pt will be able to sleep at least 5 consecutive hours at night without increased pain Baseline: 3 hrs - 04/27/23 Goal status: INITIAL   PLAN:  PT FREQUENCY: 1-2x/week  PT DURATION: 8 weeks  PLANNED INTERVENTIONS: 97110-Therapeutic exercises, 97530- Therapeutic activity, 97112- Neuromuscular re-education, 97535- Self Care, 02859- Manual therapy, [id number]- Electrical stimulation (manual), Patient/Family education, Taping, Dry Needling, Joint mobilization, Joint manipulation, Spinal manipulation, Spinal mobilization, Moist heat, and Biofeedback  PLAN FOR NEXT SESSION: cont aquatic therapy   Ronal Foots) Gradie Butrick MPT 04/28/23 10:27 AM [facility] [address] [place], [place], [address] Phone: [phone]   Fax:  [phone]     PHYSICAL THERAPY DISCHARGE SUMMARY  Visits from Start of Care: 14   Patient agrees to discharge. Patient goals were partially met. Patient is being discharged due to not returning since the last visit.  Heather Malone, PT, DPT  [facility] [address], [address] [place], [place] [address] Phone # [phone] Fax [phone]

## 2023-04-29 ENCOUNTER — Ambulatory Visit

## 2023-04-29 VITALS — BP 120/81 | HR 104 | Temp 98.7°F | Resp 16

## 2023-04-29 DIAGNOSIS — R1110 Vomiting, unspecified: Secondary | ICD-10-CM

## 2023-04-29 MED ORDER — LACTATED RINGERS IV SOLN
Freq: Once | INTRAVENOUS | Status: AC
  Filled 2023-04-29: qty 10

## 2023-04-29 MED ORDER — SODIUM CHLORIDE 0.9 % IV SOLN
25.0000 mg | INTRAVENOUS | Status: DC
  Administered 2023-04-29: 25 mg via INTRAVENOUS
  Filled 2023-04-29: qty 1

## 2023-04-29 NOTE — Progress Notes (Signed)
 Diagnosis:   Hyperemesis    Provider:  Chilton Greathouse MD  Procedure: IV Infusion  IV Type: Peripheral, IV Location: L Antecubital  Banana Bag, Dose:  Infusion Start Time: 1420  Infusion Stop Time: 1527   Phenergan 25mg  in NS, Dose: 25mg  in   Infusion Start Time: 1528  Infusion Stop Time: 1639  Post Infusion IV Care: Peripheral IV Discontinued  Discharge: Condition: Good, Destination: Home . AVS Declined  Performed by:  Loney Hering, LPN

## 2023-05-05 NOTE — Progress Notes (Unsigned)
    PRENATAL VISIT NOTE  Subjective:  Heather Malone is a 33 y.o. [id number] at [redacted]w[redacted]d being seen today for ongoing prenatal care.  She is currently monitored for the following issues for this {Blank single:19197::"high-risk","low-risk"} pregnancy and has [redacted] weeks gestation of pregnancy; Alpha thalassemia silent carrier; Anemia in pregnancy; Elevated blood pressure complicating pregnancy in first trimester, antepartum; Supervision of high-risk pregnancy; History of severe pre-eclampsia; Hyperemesis; Abdominal pain affecting pregnancy; Migraine without aura and without status migrainosus, not intractable; Rebound headache; Pregnancy headache in third trimester; Chlamydia infection affecting pregnancy in third trimester; Seasonal allergies; and Possible exposure to STI on their problem list.  Patient reports {sx:14538}.   .  .   . Denies leaking of fluid.   The following portions of the patient's history were reviewed and updated as appropriate: allergies, current medications, past family history, past medical history, past social history, past surgical history and problem list.   Objective:  There were no vitals filed for this visit.  Fetal Status:           General:  Alert, oriented and cooperative. Patient is in no acute distress.  Skin: Skin is warm and dry. No rash noted.   Cardiovascular: Normal heart rate noted  Respiratory: Normal respiratory effort, no problems with respiration noted  Abdomen: Soft, gravid, appropriate for gestational age.        Pelvic: {Blank single:19197::"Cervical exam performed in the presence of a chaperone","Cervical exam deferred"}        Extremities: Normal range of motion.     Mental Status: Normal mood and affect. Normal behavior. Normal judgment and thought content.   Assessment and Plan:  Pregnancy: G3P1102 at [redacted]w[redacted]d 1. Supervision of high risk pregnancy in third trimester (Primary) ***  2. [redacted] weeks gestation of pregnancy ***  3. History of severe  pre-eclampsia ***  4. Abdominal pain during pregnancy in third trimester ***  5. Chlamydia infection complicating pregnancy, third trimester ***  {Blank single:19197::"Term","Preterm"} labor symptoms and general obstetric precautions including but not limited to vaginal bleeding, contractions, leaking of fluid and fetal movement were reviewed in detail with the patient. Please refer to After Visit Summary for other counseling recommendations.   No follow-ups on file.  Future Appointments  Date Time Provider Department Center  05/06/2023 11:15 AM Bernerd Limbo, CNM [facility]  05/07/2023  2:00 PM CHINF-CHAIR 5 CH-IN[facility]M None  05/13/2023 11:15 AM Bernerd Limbo, CNM [facility]  05/13/2023  2:00 PM CHINF-CHAIR 4 CH-INFWM None  05/20/2023 11:15 AM Bernerd Limbo, CNM [facility]  05/20/2023  2:00 PM CHINF-CHAIR 4 CH-INFWM None  05/21/2023  9:30 AM Carlson-Long, Lise Auer [facility]B  05/25/2023 11:00 AM [facility] PROVIDER 1 [facility]  05/25/2023 11:30 AM [facility] US6 [facility]  05/25/2023  2:00 PM CHINF-CHAIR 8 CH-INFWM None  05/27/2023 11:15 AM Bernerd Limbo, CNM [facility]    Elige Ko, Student-MidWife

## 2023-05-06 ENCOUNTER — Other Ambulatory Visit (HOSPITAL_COMMUNITY)
Admission: RE | Admit: 2023-05-06 | Discharge: 2023-05-06 | Disposition: A | Discharge status: Home / Self Care | Source: Ambulatory Visit | Attending: Certified Nurse Midwife | Admitting: Certified Nurse Midwife

## 2023-05-06 ENCOUNTER — Ambulatory Visit (INDEPENDENT_AMBULATORY_CARE_PROVIDER_SITE_OTHER): Payer: Self-pay | Admitting: Certified Nurse Midwife

## 2023-05-06 ENCOUNTER — Other Ambulatory Visit: Payer: Self-pay

## 2023-05-06 VITALS — BP 116/80 | Wt 187.8 lb

## 2023-05-06 DIAGNOSIS — O26893 Other specified pregnancy related conditions, third trimester: Secondary | ICD-10-CM | POA: Diagnosis not present

## 2023-05-06 DIAGNOSIS — Z8759 Personal history of other complications of pregnancy, childbirth and the puerperium: Secondary | ICD-10-CM | POA: Diagnosis not present

## 2023-05-06 DIAGNOSIS — O0993 Supervision of high risk pregnancy, unspecified, third trimester: Secondary | ICD-10-CM | POA: Insufficient documentation

## 2023-05-06 DIAGNOSIS — O98813 Other maternal infectious and parasitic diseases complicating pregnancy, third trimester: Secondary | ICD-10-CM

## 2023-05-06 DIAGNOSIS — R109 Unspecified abdominal pain: Secondary | ICD-10-CM

## 2023-05-06 DIAGNOSIS — Z3A35 35 weeks gestation of pregnancy: Secondary | ICD-10-CM | POA: Diagnosis not present

## 2023-05-06 DIAGNOSIS — A749 Chlamydial infection, unspecified: Secondary | ICD-10-CM | POA: Insufficient documentation

## 2023-05-07 ENCOUNTER — Ambulatory Visit

## 2023-05-07 VITALS — BP 120/79 | HR 82 | Temp 98.0°F | Resp 18 | Ht 62.0 in | Wt 190.0 lb

## 2023-05-07 DIAGNOSIS — R1110 Vomiting, unspecified: Secondary | ICD-10-CM

## 2023-05-07 LAB — CERVICOVAGINAL ANCILLARY ONLY
Chlamydia: NEGATIVE
Comment: NEGATIVE
Comment: NORMAL
Neisseria Gonorrhea: NEGATIVE

## 2023-05-07 MED ORDER — LACTATED RINGERS IV SOLN
Freq: Once | INTRAVENOUS | Status: AC
  Filled 2023-05-07: qty 10

## 2023-05-07 MED ORDER — SODIUM CHLORIDE 0.9 % IV SOLN
25.0000 mg | INTRAVENOUS | Status: DC
  Administered 2023-05-07: 25 mg via INTRAVENOUS
  Filled 2023-05-07: qty 1

## 2023-05-07 NOTE — Progress Notes (Signed)
 Diagnosis: Hyperemesis  Provider:  Chilton Greathouse MD  Procedure: IV Infusion  IV Type: Peripheral, IV Location: R Antecubital  Banana Bag, Dose: 1000 ml  Infusion Start Time: 1428  Infusion Stop Time: 1535   Normal Saline with Promethazine 25mg  Dose: 1000 ml  Infusion Start Time: 1536  Infusion Stop Time: 1643  Post Infusion IV Care: Peripheral IV Discontinued  Discharge: Condition: Good, Destination: Home . AVS Declined  Performed by:  Adriana Mccallum, RN

## 2023-05-10 LAB — CULTURE, BETA STREP (GROUP B ONLY): Strep Gp B Culture: NEGATIVE

## 2023-05-11 ENCOUNTER — Encounter: Payer: Self-pay | Admitting: Certified Nurse Midwife

## 2023-05-13 ENCOUNTER — Other Ambulatory Visit: Payer: Self-pay

## 2023-05-13 ENCOUNTER — Ambulatory Visit: Payer: Self-pay | Admitting: Certified Nurse Midwife

## 2023-05-13 ENCOUNTER — Ambulatory Visit (INDEPENDENT_AMBULATORY_CARE_PROVIDER_SITE_OTHER)

## 2023-05-13 VITALS — BP 129/87 | HR 92 | Wt 187.3 lb

## 2023-05-13 VITALS — BP 126/83 | HR 86 | Temp 97.4°F | Resp 16 | Ht 62.0 in | Wt 188.0 lb

## 2023-05-13 DIAGNOSIS — O26893 Other specified pregnancy related conditions, third trimester: Secondary | ICD-10-CM | POA: Diagnosis not present

## 2023-05-13 DIAGNOSIS — R109 Unspecified abdominal pain: Secondary | ICD-10-CM

## 2023-05-13 DIAGNOSIS — R1110 Vomiting, unspecified: Secondary | ICD-10-CM

## 2023-05-13 DIAGNOSIS — Z3A37 37 weeks gestation of pregnancy: Secondary | ICD-10-CM | POA: Diagnosis not present

## 2023-05-13 DIAGNOSIS — A749 Chlamydial infection, unspecified: Secondary | ICD-10-CM

## 2023-05-13 DIAGNOSIS — O98813 Other maternal infectious and parasitic diseases complicating pregnancy, third trimester: Secondary | ICD-10-CM

## 2023-05-13 DIAGNOSIS — O0993 Supervision of high risk pregnancy, unspecified, third trimester: Secondary | ICD-10-CM

## 2023-05-13 DIAGNOSIS — Z3A36 36 weeks gestation of pregnancy: Secondary | ICD-10-CM

## 2023-05-13 MED ORDER — SODIUM CHLORIDE 0.9 % IV SOLN
25.0000 mg | INTRAVENOUS | Status: DC
  Administered 2023-05-13: 25 mg via INTRAVENOUS
  Filled 2023-05-13: qty 1

## 2023-05-13 MED ORDER — LACTATED RINGERS IV SOLN
Freq: Once | INTRAVENOUS | Status: AC
  Filled 2023-05-13: qty 10

## 2023-05-13 NOTE — Progress Notes (Addendum)
 Diagnosis: Hyperemesis  Provider:  Phyllis Breeze MD  Procedure: IV Infusion  IV Type: Peripheral, IV Location: L Antecubital  Banana Bag, Dose: 1000 ml  Infusion Start Time: 1414  Infusion Stop Time: 1522   Promethazine 25mg  in , Dose: 25mg    Infusion Start Time: 1523  Infusion Stop Time: 1635  Post Infusion IV Care: Peripheral IV Discontinued  Discharge: Condition: Good, Destination: Home . AVS Declined  Performed by:  Star [place], LPN

## 2023-05-14 ENCOUNTER — Encounter: Payer: Self-pay | Admitting: Certified Nurse Midwife

## 2023-05-14 NOTE — Progress Notes (Signed)
   PRENATAL VISIT NOTE  Subjective:  Heather Malone is a 33 y.o. G3P1102 at [redacted]w[redacted]d being seen today for ongoing prenatal care.  She is currently monitored for the following issues for this high-risk pregnancy and has [redacted] weeks gestation of pregnancy; Alpha thalassemia silent carrier; Anemia in pregnancy; Elevated blood pressure complicating pregnancy in first trimester, antepartum; Supervision of high-risk pregnancy; History of severe pre-eclampsia; Hyperemesis; Abdominal pain affecting pregnancy; Migraine without aura and without status migrainosus, not intractable; Rebound headache; Pregnancy headache in third trimester; Chlamydia infection affecting pregnancy in third trimester; Seasonal allergies; and Possible exposure to STI on their problem list.  Patient reports no complaints.  Contractions: Irritability. Vag. Bleeding: None.  Movement: Present. Denies leaking of fluid.   The following portions of the patient's history were reviewed and updated as appropriate: allergies, current medications, past family history, past medical history, past social history, past surgical history and problem list.   Objective:   Vitals:   05/13/23 1152 05/13/23 1224  BP: (!) 131/91 129/87  Pulse: 80 92  Weight: 187 lb 4.8 oz (85 kg)     Fetal Status: Fetal Heart Rate (bpm): 142 Fundal Height: 37 cm Movement: Present  Presentation: Vertex  General:  Alert, oriented and cooperative. Patient is in no acute distress.  Skin: Skin is warm and dry. No rash noted.   Cardiovascular: Normal heart rate noted  Respiratory: Normal respiratory effort, no problems with respiration noted  Abdomen: Soft, gravid, appropriate for gestational age.  Pain/Pressure: Present     Pelvic: Cervical exam deferred        Extremities: Normal range of motion.  Edema: Trace  Mental Status: Normal mood and affect. Normal behavior. Normal judgment and thought content.   Assessment and Plan:  Pregnancy: G3P1102 at [redacted]w[redacted]d 1.  Supervision of high risk pregnancy in third trimester (Primary) - Doing well, feeling regular and vigorous fetal movement   2. [redacted] weeks gestation of pregnancy - Routine OB care   3. Abdominal pain during pregnancy in third trimester - Much improved, able to get around without the Heather Malone  4. Chlamydia infection complicating pregnancy, third trimester - Negative TOC at last visit  5. Elevated blood pressure reading - Elevated at first today but normal on recheck. No s/sx PEC, but strong history of it. - Had one elevated BP in MAU at 22wks, will discuss with attending and decide on IOL plans.  Preterm labor symptoms and general obstetric precautions including but not limited to vaginal bleeding, contractions, leaking of fluid and fetal movement were reviewed in detail with the patient. Please refer to After Visit Summary for other counseling recommendations.   Return in about 1 week (around 05/20/2023) for IN-PERSON, HOB.  Future Appointments  Date Time Provider Department Center  05/20/2023 11:15 AM Cleophas Dadds [facility]  05/20/2023  2:00 PM CHINF-CHAIR 4 CH-INFWM None  05/21/2023  9:30 AM Sherlean Dirk [facility]-RE[facility] DW[facility]  05/25/2023 11:00 AM [facility] PROVIDER 1 [facility]  05/25/2023 11:30 AM [facility] US6 [facility]  05/25/2023  2:00 PM CHINF-CHAIR 8 CH-INFWM None  05/27/2023 11:15 AM Derick Fleeting, CNM [facility]    Derick Fleeting, CNM

## 2023-05-19 NOTE — Progress Notes (Unsigned)
   PRENATAL VISIT NOTE  Subjective:  Heather Malone is a 33 y.o. N8G9562 at [redacted]w[redacted]d being seen today for ongoing prenatal care.  She is currently monitored for the following issues for this {Blank single:19197::"high-risk","low-risk"} pregnancy and has Alpha thalassemia silent carrier; Anemia in pregnancy; Elevated blood pressure complicating pregnancy in first trimester, antepartum; Supervision of high-risk pregnancy; History of severe pre-eclampsia; Hyperemesis; Abdominal pain affecting pregnancy; Migraine without aura and without status migrainosus, not intractable; Rebound headache; Pregnancy headache in third trimester; Chlamydia infection affecting pregnancy in third trimester; Seasonal allergies; and Possible exposure to STI on their problem list.  Patient reports {sx:14538}.   .  .   . Denies leaking of fluid.   The following portions of the patient's history were reviewed and updated as appropriate: allergies, current medications, past family history, past medical history, past social history, past surgical history and problem list.   Objective:  There were no vitals filed for this visit.  Fetal Status:           General:  Alert, oriented and cooperative. Patient is in no acute distress.  Skin: Skin is warm and dry. No rash noted.   Cardiovascular: Normal heart rate noted  Respiratory: Normal respiratory effort, no problems with respiration noted  Abdomen: Soft, gravid, appropriate for gestational age.        Pelvic: {Blank single:19197::"Cervical exam performed in the presence of a chaperone","Cervical exam deferred"}        Extremities: Normal range of motion.     Mental Status: Normal mood and affect. Normal behavior. Normal judgment and thought content.   Assessment and Plan:  Pregnancy: G3P1102 at [redacted]w[redacted]d 1. Supervision of high risk pregnancy in third trimester (Primary) ***  2. [redacted] weeks gestation of pregnancy ***  3. History of severe pre-eclampsia ***  {Blank  single:19197::"Term","Preterm"} labor symptoms and general obstetric precautions including but not limited to vaginal bleeding, contractions, leaking of fluid and fetal movement were reviewed in detail with the patient. Please refer to After Visit Summary for other counseling recommendations.   No follow-ups on file.  Future Appointments  Date Time Provider Department Center  05/20/2023 11:15 AM Cleophas Dadds [facility]  05/20/2023  2:00 PM CHINF-CHAIR 4 CH-INFWM None  05/25/2023 11:00 AM [facility] PROVIDER 1 [facility]  05/25/2023 11:30 AM [facility] US6 [facility]  05/25/2023  2:00 PM CHINF-CHAIR 8 CH-INFWM None  05/27/2023 11:15 AM Derick Fleeting, CNM [facility]    Derick Fleeting, CNM

## 2023-05-20 ENCOUNTER — Encounter (HOSPITAL_COMMUNITY): Payer: Self-pay | Admitting: *Deleted

## 2023-05-20 ENCOUNTER — Other Ambulatory Visit: Payer: Self-pay

## 2023-05-20 ENCOUNTER — Telehealth (HOSPITAL_COMMUNITY): Payer: Self-pay | Admitting: *Deleted

## 2023-05-20 ENCOUNTER — Ambulatory Visit

## 2023-05-20 ENCOUNTER — Ambulatory Visit (INDEPENDENT_AMBULATORY_CARE_PROVIDER_SITE_OTHER): Payer: Self-pay | Admitting: Certified Nurse Midwife

## 2023-05-20 VITALS — BP 118/83 | HR 97 | Temp 98.0°F | Resp 16 | Ht 62.0 in | Wt 193.0 lb

## 2023-05-20 VITALS — BP 122/85 | HR 85 | Wt 191.8 lb

## 2023-05-20 DIAGNOSIS — Z3A37 37 weeks gestation of pregnancy: Secondary | ICD-10-CM | POA: Diagnosis not present

## 2023-05-20 DIAGNOSIS — O0993 Supervision of high risk pregnancy, unspecified, third trimester: Secondary | ICD-10-CM

## 2023-05-20 DIAGNOSIS — R1110 Vomiting, unspecified: Secondary | ICD-10-CM | POA: Diagnosis not present

## 2023-05-20 DIAGNOSIS — Z8759 Personal history of other complications of pregnancy, childbirth and the puerperium: Secondary | ICD-10-CM | POA: Diagnosis not present

## 2023-05-20 DIAGNOSIS — O133 Gestational [pregnancy-induced] hypertension without significant proteinuria, third trimester: Secondary | ICD-10-CM | POA: Diagnosis not present

## 2023-05-20 MED ORDER — LACTATED RINGERS IV SOLN
Freq: Once | INTRAVENOUS | Status: AC
  Filled 2023-05-20: qty 10

## 2023-05-20 MED ORDER — SODIUM CHLORIDE 0.9 % IV SOLN
25.0000 mg | INTRAVENOUS | Status: DC
  Administered 2023-05-20: 25 mg via INTRAVENOUS
  Filled 2023-05-20: qty 1

## 2023-05-20 NOTE — Telephone Encounter (Signed)
 Preadmission screen

## 2023-05-20 NOTE — Progress Notes (Signed)
 Diagnosis: Hyperemesis   Provider:  Phyllis Breeze MD  Procedure: IV Infusion  IV Type: Peripheral, IV Location: L Antecubital  MVI adult (Infuvite Adult ) in lactated ringers , Dose: 1000 ml  Infusion Start Time: 1417  Infusion Stop Time: 1522  Post Infusion IV Care: Peripheral IV Discontinued  Discharge: Condition: Good, Destination: Home . AVS Declined  Performed by:  Lauran Pollard, LPN    Diagnosis: Hyperemesis   Provider:  Praveen Mannam MD  Procedure: IV Infusion  IV Type: Peripheral, IV Location: L Antecubital  Promethazine  (Phenergan ) in sodium chloride , Dose: 1000 ml  Infusion Start Time: 1525  Infusion Stop Time: 1635  Post Infusion IV Care: Peripheral IV Discontinued  Discharge: Condition: Good, Destination: Home . AVS Declined  Performed by:  Lauran Pollard, LPN

## 2023-05-20 NOTE — Progress Notes (Unsigned)
 Lc to room per provider request due to mom wanting to breastfeed and having challenges with pass babies. She stated she could breastfeed pass 3 months.  Lc's educated on milk production expectation, Supply and demand, hand expression, nipple changes and colostrum collecting. LC offer flange size fitting, due to mom stating she had pain with pumping last time. Demonstrated hand expression technique. Encourage mom to reach out with any questions or concerns.    Set mom up with OP lactation appointment for May 13.   [employer] and [address] [place], [place]

## 2023-05-21 ENCOUNTER — Inpatient Hospital Stay (HOSPITAL_COMMUNITY)
Admission: RE | Admit: 2023-05-21 | Discharge: 2023-05-23 | DRG: 806 | Disposition: A | Discharge status: Home / Self Care | Attending: Obstetrics and Gynecology | Admitting: Obstetrics and Gynecology

## 2023-05-21 ENCOUNTER — Encounter (HOSPITAL_COMMUNITY): Payer: Self-pay | Admitting: Obstetrics and Gynecology

## 2023-05-21 ENCOUNTER — Inpatient Hospital Stay (HOSPITAL_COMMUNITY)

## 2023-05-21 ENCOUNTER — Other Ambulatory Visit: Payer: Self-pay

## 2023-05-21 ENCOUNTER — Ambulatory Visit (HOSPITAL_BASED_OUTPATIENT_CLINIC_OR_DEPARTMENT_OTHER): Payer: Self-pay | Admitting: Physical Therapy

## 2023-05-21 DIAGNOSIS — O9081 Anemia of the puerperium: Secondary | ICD-10-CM | POA: Diagnosis not present

## 2023-05-21 DIAGNOSIS — O0993 Supervision of high risk pregnancy, unspecified, third trimester: Principal | ICD-10-CM

## 2023-05-21 DIAGNOSIS — Z87891 Personal history of nicotine dependence: Secondary | ICD-10-CM | POA: Diagnosis not present

## 2023-05-21 DIAGNOSIS — R1110 Vomiting, unspecified: Secondary | ICD-10-CM

## 2023-05-21 DIAGNOSIS — Z833 Family history of diabetes mellitus: Secondary | ICD-10-CM

## 2023-05-21 DIAGNOSIS — Z148 Genetic carrier of other disease: Secondary | ICD-10-CM

## 2023-05-21 DIAGNOSIS — Z8249 Family history of ischemic heart disease and other diseases of the circulatory system: Secondary | ICD-10-CM

## 2023-05-21 DIAGNOSIS — Z8759 Personal history of other complications of pregnancy, childbirth and the puerperium: Secondary | ICD-10-CM

## 2023-05-21 DIAGNOSIS — O134 Gestational [pregnancy-induced] hypertension without significant proteinuria, complicating childbirth: Secondary | ICD-10-CM | POA: Diagnosis not present

## 2023-05-21 DIAGNOSIS — D62 Acute posthemorrhagic anemia: Secondary | ICD-10-CM | POA: Diagnosis not present

## 2023-05-21 DIAGNOSIS — O161 Unspecified maternal hypertension, first trimester: Secondary | ICD-10-CM | POA: Diagnosis present

## 2023-05-21 DIAGNOSIS — D563 Thalassemia minor: Secondary | ICD-10-CM | POA: Diagnosis present

## 2023-05-21 DIAGNOSIS — O99019 Anemia complicating pregnancy, unspecified trimester: Secondary | ICD-10-CM | POA: Diagnosis present

## 2023-05-21 DIAGNOSIS — Z3A38 38 weeks gestation of pregnancy: Secondary | ICD-10-CM

## 2023-05-21 DIAGNOSIS — O0990 Supervision of high risk pregnancy, unspecified, unspecified trimester: Secondary | ICD-10-CM

## 2023-05-21 LAB — COMPREHENSIVE METABOLIC PANEL WITH GFR
ALT: 9 U/L (ref 0–44)
AST: 15 U/L (ref 15–41)
Albumin: 3 g/dL — ABNORMAL LOW (ref 3.5–5.0)
Alkaline Phosphatase: 51 U/L (ref 38–126)
Anion gap: 9 (ref 5–15)
BUN: <5 mg/dL — ABNORMAL LOW (ref 6–20)
CO2: 20 mmol/L — ABNORMAL LOW (ref 22–32)
Calcium: 8.8 mg/dL — ABNORMAL LOW (ref 8.9–10.3)
Chloride: 106 mmol/L (ref 98–111)
Creatinine, Ser: 0.43 mg/dL — ABNORMAL LOW (ref 0.44–1.00)
GFR, Estimated: >60 mL/min (ref >60)
Glucose, Bld: 89 mg/dL (ref 70–99)
Potassium: 3.6 mmol/L (ref 3.5–5.1)
Sodium: 135 mmol/L (ref 135–145)
Total Bilirubin: 0.4 mg/dL (ref 0.0–1.2)
Total Protein: 6.3 g/dL — ABNORMAL LOW (ref 6.5–8.1)

## 2023-05-21 LAB — RPR: RPR Ser Ql: NONREACTIVE

## 2023-05-21 LAB — CBC
HCT: 31.5 % — ABNORMAL LOW (ref 36.0–46.0)
Hemoglobin: 10.3 g/dL — ABNORMAL LOW (ref 12.0–15.0)
MCH: 28.2 pg (ref 26.0–34.0)
MCHC: 32.7 g/dL (ref 30.0–36.0)
MCV: 86.3 fL (ref 80.0–100.0)
Platelets: 221 10*3/uL (ref 150–400)
RBC: 3.65 MIL/uL — ABNORMAL LOW (ref 3.87–5.11)
RDW: 13.5 % (ref 11.5–15.5)
WBC: 4.7 10*3/uL (ref 4.0–10.5)
nRBC: 0.4 % — ABNORMAL HIGH (ref 0.0–0.2)

## 2023-05-21 LAB — TYPE AND SCREEN
ABO/RH(D): B POS
Antibody Screen: NEGATIVE

## 2023-05-21 LAB — PROTEIN / CREATININE RATIO, URINE
Creatinine, Urine: 24 mg/dL
Total Protein, Urine: <6 mg/dL

## 2023-05-21 MED ORDER — MISOPROSTOL 50MCG HALF TABLET
50.0000 ug | ORAL_TABLET | Freq: Once | ORAL | Status: DC
Start: 2023-05-21 — End: 2023-05-22

## 2023-05-21 MED ORDER — SODIUM CHLORIDE 0.9 % IV SOLN: 250.0000 mL | INTRAVENOUS | Status: DC | PRN

## 2023-05-21 MED ORDER — MISOPROSTOL 25 MCG QUARTER TABLET: 25.0000 ug | ORAL_TABLET | Freq: Once | ORAL | Status: DC

## 2023-05-21 MED ORDER — SOD CITRATE-CITRIC ACID 500-334 MG/5ML PO SOLN
30.0000 mL | ORAL | Status: DC | PRN
Start: 2023-05-21 — End: 2023-05-22

## 2023-05-21 MED ORDER — SODIUM CHLORIDE 0.9% FLUSH: 3.0000 mL | Freq: Two times a day (BID) | INTRAVENOUS | Status: DC

## 2023-05-21 MED ORDER — OXYTOCIN BOLUS FROM INFUSION
333.0000 mL | Freq: Once | INTRAVENOUS | Status: AC
  Administered 2023-05-21: 333 mL via INTRAVENOUS

## 2023-05-21 MED ORDER — ONDANSETRON HCL 4 MG/2ML IJ SOLN: 4.0000 mg | Freq: Four times a day (QID) | INTRAMUSCULAR | Status: DC | PRN

## 2023-05-21 MED ORDER — FENTANYL CITRATE (PF) 100 MCG/2ML IJ SOLN
50.0000 ug | INTRAMUSCULAR | Status: DC | PRN
  Administered 2023-05-21: 100 ug via INTRAVENOUS
  Filled 2023-05-21: qty 2

## 2023-05-21 MED ORDER — OXYCODONE-ACETAMINOPHEN 5-325 MG PO TABS: 1.0000 | ORAL_TABLET | ORAL | Status: DC | PRN

## 2023-05-21 MED ORDER — LACTATED RINGERS IV SOLN: INTRAVENOUS | Status: AC

## 2023-05-21 MED ORDER — TERBUTALINE SULFATE 1 MG/ML IJ SOLN: 0.2500 mg | Freq: Once | INTRAMUSCULAR | Status: DC | PRN

## 2023-05-21 MED ORDER — OXYTOCIN-SODIUM CHLORIDE 30-0.9 UT/500ML-% IV SOLN
1.0000 m[IU]/min | INTRAVENOUS | Status: DC
  Administered 2023-05-21: 2 m[IU]/min via INTRAVENOUS
  Filled 2023-05-21: qty 500

## 2023-05-21 MED ORDER — SODIUM CHLORIDE 0.9% FLUSH: 3.0000 mL | INTRAVENOUS | Status: DC | PRN

## 2023-05-21 MED ORDER — FLEET ENEMA RE ENEM: 1.0000 | ENEMA | RECTAL | Status: DC | PRN

## 2023-05-21 MED ORDER — OXYCODONE-ACETAMINOPHEN 5-325 MG PO TABS
2.0000 | ORAL_TABLET | ORAL | Status: DC | PRN
  Administered 2023-05-22: 2 via ORAL
  Filled 2023-05-21: qty 2

## 2023-05-21 MED ORDER — LIDOCAINE HCL (PF) 1 % IJ SOLN: 30.0000 mL | INTRAMUSCULAR | Status: DC | PRN

## 2023-05-21 MED ORDER — LACTATED RINGERS IV SOLN: 500.0000 mL | INTRAVENOUS | Status: DC | PRN

## 2023-05-21 MED ORDER — ACETAMINOPHEN 325 MG PO TABS: 650.0000 mg | ORAL_TABLET | ORAL | Status: DC | PRN

## 2023-05-21 MED ORDER — OXYTOCIN-SODIUM CHLORIDE 30-0.9 UT/500ML-% IV SOLN
2.5000 [IU]/h | INTRAVENOUS | Status: DC
  Administered 2023-05-22: 2.5 [IU]/h via INTRAVENOUS

## 2023-05-21 NOTE — Progress Notes (Signed)
 Patient ID: Heather Malone, female   DOB: 1990/11/18, 33 y.o.   MRN: [id number]  BP 127/84   Pulse 73   Temp 98 F (36.7 C) (Oral)   Resp 18   Ht 5\' 2"  (1.575 m)   Wt 88.5 kg   LMP 08/18/2022 (Exact Date)   BMI 35.67 kg/m   Dilation: 5 Effacement (%): 90 Station: -1 Presentation: Vertex Exam by:: Larissa Plowman, RN  Patient is coping well with contractions. She denies any concerns but verbalized being ready for delivery. BP stable. Was rechecked by the nurse and has effaced more and baby is much lower. Encouraged active movement and position changes to promote fetal descent. She is agreeable to this plan. Progressing well on pitocin . Cat 1 FHT. Anticipate NSVD.

## 2023-05-21 NOTE — H&P (Signed)
 OBSTETRIC ADMISSION HISTORY AND PHYSICAL  Heather Malone is a 33 y.o. female [phone] with IUP at [redacted]w[redacted]d by early US  presenting for IOL for gHTN. She reports +FMs, No LOF, no VB, no blurry vision, headaches or peripheral edema, and RUQ pain.  She plans on breastfeeding. She request BTL for birth control. She received her prenatal care at [facility]   Dating: By early US  --->  Estimated Date of Delivery: 06/04/23  Sono:    @[redacted]w[redacted]d , CWD, normal anatomy, cephalic presentation, posterior placenta, 2633g, 83% EFW   Prenatal History/Complications:  NURSING  PROVIDER  [facility] Dating by US  @ 6 w  [facility] Model Traditional Anatomy U/S Scheduled 01/08/23  Initiated care at  [facility]                Language  English              LAB RESULTS   Support Person Mom or partner (?) Genetics NIPS: LR female AFP: Negative    NT/IT (FT only)     Carrier Screen Horizon: silent carrier alpha thal  Rhogam  B/Positive/-- (10/02 1217) A1C/GTT Early: 5.6 Third trimester:  1 hr 131 (wnl)  Flu Vaccine Declined 11/05/22    TDaP Vaccine 04/08/23 Blood Type B/Positive/-- (10/02 1217)  Covid Vaccine  Antibody Negative (10/02 1217)  RSV Vaccine  Rubella 2.56 (10/02 1217)  Feeding Plan breast (needs prenatal lactation) RPR Non Reactive (10/02 1217)  Contraception bilateral tubal ligation HBsAg Negative (10/02 1217)  Circumcision NA (female) HIV Non Reactive (10/02 1217)  Pediatrician  [facility] Lajuana Pilar) HCVAb Non Reactive (10/02 1217)  Prenatal Classes       Pap Diagnosis  Date Value Ref Range Status  04/12/2020   Final   - Negative for intraepithelial lesion or malignancy (NILM)    BTL Consent 02/25/23 GC/CT Initial:   36wks:    VBAC Consent NA GBS   For PCN allergy, check sensitivities        DME Rx [ ]  BP cuff [ ]  Weight Scale Waterbirth  [ ]  Class [ ]  Consent [ ]  CNM visit  PHQ9 & GAD7 [  ] new OB [  ] 28 weeks  [  ] 36 weeks Induction  [ ]  Orders Entered [ ] Foley Y/N      Past Medical History: Past Medical History:  Diagnosis Date   Anemia    Anxiety    Asthma    albuterol  prn   Family history of adverse reaction to anesthesia    grandmother died from anesthesia unsure why   Headache    migraines   Hx of trichomoniasis    Hx of varicella    Neck injury, subsequent encounter 07/26/2016   Placental abruption in third trimester 09/04/2014   Pre-eclampsia    Pregnancy induced hypertension    Subchorionic hemorrhage in third trimester 07/26/2014   Velamentous insertion of umbilical cord     Past Surgical History: Past Surgical History:  Procedure Laterality Date   TONSILLECTOMY      Obstetrical History: OB History     Gravida  3   Para  2   Term  1   Preterm  1   AB  0   Living  2      SAB  0   IAB  0   Ectopic  0   Multiple  0   Live Births  2           Social  History Social History   Socioeconomic History   Marital status: Single    Spouse name: Not on file   Number of children: Not on file   Years of education: Not on file   Highest education level: Not on file  Occupational History   Not on file  Tobacco Use   Smoking status: Former    Types: Cigars   Smokeless tobacco: Never  Vaping Use   Vaping status: Never Used  Substance and Sexual Activity   Alcohol use: Not Currently    Comment: weekends   Drug use: Not Currently    Types: Marijuana    Comment: none with + UPT   Sexual activity: Yes    Birth control/protection: None  Other Topics Concern   Not on file  Social History Narrative   Not on file   Social Drivers of Health   Financial Resource Strain: Not on file  Food Insecurity: No Food Insecurity (05/21/2023)   Hunger Vital Sign    Worried About Running Out of Food in the Last Year: Never true    Ran Out of Food in the Last Year: Never true  Transportation Needs: No Transportation Needs (05/21/2023)   PRAPARE - Administrator, Civil Service (Medical): No    Lack of  Transportation (Non-Medical): No  Physical Activity: Not on file  Stress: Not on file  Social Connections: Not on file    Family History: Family History  Problem Relation Age of Onset   Hypertension Mother    Hypertension Maternal Grandmother    Heart attack Maternal Grandmother    Hypertension Maternal Grandfather    Cancer Maternal Grandfather    Diabetes Paternal Grandmother     Allergies: No Known Allergies  Medications Prior to Admission  Medication Sig Dispense Refill Last Dose/Taking   cetirizine  (ZYRTEC ) 10 MG tablet Take 1 tablet (10 mg total) by mouth daily. 30 tablet 3 05/20/2023   omeprazole  (PRILOSEC) 40 MG capsule Take 1 capsule (40 mg total) by mouth daily. 30 capsule 4 05/20/2023   Prenatal Vit-Fe Fumarate-FA (MULTIVITAMIN-PRENATAL) 27-0.8 MG TABS tablet Take 1 tablet by mouth daily at 12 noon. 30 tablet 0 05/20/2023   Butalbital -APAP-Caffeine  50-325-40 MG capsule Take 1-2 capsules by mouth every 6 (six) hours as needed for headache. (Patient not taking: Reported on 05/20/2023) 30 capsule 1 More than a month   cyclobenzaprine  (FLEXERIL ) 10 MG tablet Take 1 tablet (10 mg total) by mouth every 8 (eight) hours as needed for muscle spasms. (Patient not taking: Reported on 05/20/2023) 60 tablet 1 More than a month   Doxylamine -Pyridoxine ER (BONJESTA ) 20-20 MG TBCR Take 1 tablet by mouth at bedtime. (Patient not taking: Reported on 05/20/2023) 60 tablet 3 More than a month     Review of Systems   All systems reviewed and negative except as stated in HPI  Blood pressure 126/89, pulse 87, temperature 98.2 F (36.8 C), temperature source Oral, resp. rate 18, height 5\' 2"  (1.575 m), weight 88.5 kg, last menstrual period 08/18/2022. General appearance: alert, cooperative, and no distress Lungs: clear to auscultation bilaterally Heart: regular rate and rhythm Abdomen: soft, non-tender; bowel sounds normal Extremities: Homans sign is negative, no sign of DVT Presentation:  cephalic Fetal monitoringBaseline: 145 bpm, Variability: Good {> 6 bpm), Accelerations: Reactive, and Decelerations: Absent Uterine activity mild contractions about every 3-5 minutes Dilation: 3 Effacement (%): 70 Station: Ballotable Exam by:: Marlys Singh, CNM   Prenatal labs: ABO, Rh: --/--/PENDING (04/24 1324) Antibody: PENDING (04/24 4010)  Rubella: 2.56 (10/02 1217) RPR: Non Reactive (03/26 1103)  HBsAg: Negative (03/26 1103)  HIV: Non Reactive (03/26 1103)  GBS: Negative/-- (04/09 1313)    Lab Results  Component Value Date   GBS Negative 05/06/2023   GTT WNL Genetic screening  WNL Anatomy US  WNL  Immunization History  Administered Date(s) Administered   Tdap 08/01/2020, 04/08/2023    Prenatal Transfer Tool  Maternal Diabetes: No Genetic Screening: Normal Maternal Ultrasounds/Referrals: Normal Fetal Ultrasounds or other Referrals:  None Maternal Substance Abuse:  No Significant Maternal Medications:  None Significant Maternal Lab Results: Group B Strep negative Number of Prenatal Visits:greater than 3 verified prenatal visits Maternal Vaccinations:TDap Other Comments:  None   Results for orders placed or performed during the hospital encounter of 05/21/23 (from the past 24 hours)  Type and screen   Collection Time: 05/21/23  9:59 AM  Result Value Ref Range   ABO/RH(D) PENDING    Antibody Screen PENDING    Sample Expiration      05/24/2023,2359 Performed at [facility], [address] [place], [place] [address]   CBC   Collection Time: 05/21/23 10:00 AM  Result Value Ref Range   WBC 4.7 4.0 - 10.5 K/uL   RBC 3.65 (L) 3.87 - 5.11 MIL/uL   Hemoglobin 10.3 (L) 12.0 - 15.0 g/dL   HCT 60.4 (L) 54.0 - 98.1 %   MCV 86.3 80.0 - 100.0 fL   MCH 28.2 26.0 - 34.0 pg   MCHC 32.7 30.0 - 36.0 g/dL   RDW 19.1 47.8 - 29.5 %   Platelets 221 150 - 400 K/uL   nRBC 0.4 (H) 0.0 - 0.2 %    Patient Active Problem List   Diagnosis Date Noted   Indication for care in  labor or delivery 05/21/2023   Seasonal allergies 04/22/2023   Possible exposure to STI 04/22/2023   Chlamydia infection affecting pregnancy in third trimester 04/08/2023   Migraine without aura and without status migrainosus, not intractable 03/06/2023   Rebound headache 03/06/2023   Pregnancy headache in third trimester 03/06/2023   Abdominal pain affecting pregnancy 02/11/2023   Hyperemesis 12/10/2022   Supervision of high-risk pregnancy 10/29/2022   History of severe pre-eclampsia 10/29/2022   Elevated blood pressure complicating pregnancy in first trimester, antepartum 10/01/2022   Anemia in pregnancy 08/05/2020   Alpha thalassemia silent carrier 08/02/2020    Assessment/Plan:  Heather Malone is a 33 y.o. [id number] at [redacted]w[redacted]d here for IOL for gHTN. Cat 1 FHT.  #Labor: AROM attempted but baby is ballotable. Patient agrees to Pitocin  now and attempt AROM again when baby is better engaged. #Pain: IV pain medication or epidural PRN. #FWB: Cat 1 FHT #GBS status:  negative #Feeding: Breastmilk  #Reproductive Life planning: Tubal Ligation #Circ:  not applicable  Wilford Hanks, Student-MidWife  05/21/2023, 11:20 AM

## 2023-05-21 NOTE — Progress Notes (Signed)
 Patient ID: Heather Malone, female   DOB: Jul 12, 1990, 33 y.o.   MRN: [id number]  BP (!) 139/90   Pulse 82   Temp 98.2 F (36.8 C) (Oral)   Resp 18   Ht 5\' 2"  (1.575 m)   Wt 88.5 kg   LMP 08/18/2022 (Exact Date)   BMI 35.67 kg/m   Dilation: 5 Effacement (%): 70 Station: -3 Presentation: Vertex Exam by:: Tyrone Gallop, Student nurse midwife   Patient is feeling contractions stronger while on pitocin  and is making cervical change. Agreeable to AROM. Tolerated well. Clear fluid. Baby is now well applied to the cervix. Cat 1 FHT. Anticipate SVD.

## 2023-05-22 ENCOUNTER — Encounter (HOSPITAL_COMMUNITY): Payer: Self-pay | Admitting: Obstetrics and Gynecology

## 2023-05-22 LAB — CBC
HCT: 30.1 % — ABNORMAL LOW (ref 36.0–46.0)
Hemoglobin: 10 g/dL — ABNORMAL LOW (ref 12.0–15.0)
MCH: 28.3 pg (ref 26.0–34.0)
MCHC: 33.2 g/dL (ref 30.0–36.0)
MCV: 85.3 fL (ref 80.0–100.0)
Platelets: 231 10*3/uL (ref 150–400)
RBC: 3.53 MIL/uL — ABNORMAL LOW (ref 3.87–5.11)
RDW: 13.4 % (ref 11.5–15.5)
WBC: 12.8 10*3/uL — ABNORMAL HIGH (ref 4.0–10.5)
nRBC: 0 % (ref 0.0–0.2)

## 2023-05-22 MED ORDER — COCONUT OIL OIL: 1.0000 | TOPICAL_OIL | Status: DC | PRN

## 2023-05-22 MED ORDER — SODIUM CHLORIDE 0.9% FLUSH: 3.0000 mL | INTRAVENOUS | Status: DC | PRN

## 2023-05-22 MED ORDER — SENNOSIDES-DOCUSATE SODIUM 8.6-50 MG PO TABS
2.0000 | ORAL_TABLET | ORAL | Status: DC
  Administered 2023-05-22 – 2023-05-23 (×2): 2 via ORAL
  Filled 2023-05-22 (×2): qty 2

## 2023-05-22 MED ORDER — LACTATED RINGERS IV SOLN: INTRAVENOUS | Status: DC

## 2023-05-22 MED ORDER — FERROUS SULFATE 325 (65 FE) MG PO TABS
325.0000 mg | ORAL_TABLET | ORAL | Status: DC
  Filled 2023-05-22: qty 1

## 2023-05-22 MED ORDER — POTASSIUM CHLORIDE CRYS ER 20 MEQ PO TBCR
20.0000 meq | EXTENDED_RELEASE_TABLET | Freq: Every day | ORAL | Status: DC
  Administered 2023-05-22 – 2023-05-23 (×2): 20 meq via ORAL
  Filled 2023-05-22 (×2): qty 1

## 2023-05-22 MED ORDER — FAMOTIDINE 20 MG PO TABS
40.0000 mg | ORAL_TABLET | Freq: Once | ORAL | Status: AC
  Administered 2023-05-23: 40 mg via ORAL
  Filled 2023-05-22: qty 2

## 2023-05-22 MED ORDER — SIMETHICONE 80 MG PO CHEW: 80.0000 mg | CHEWABLE_TABLET | ORAL | Status: DC | PRN

## 2023-05-22 MED ORDER — PRENATAL MULTIVITAMIN CH
1.0000 | ORAL_TABLET | Freq: Every day | ORAL | Status: DC
  Administered 2023-05-22 – 2023-05-23 (×2): 1 via ORAL
  Filled 2023-05-22 (×2): qty 1

## 2023-05-22 MED ORDER — NIFEDIPINE ER OSMOTIC RELEASE 30 MG PO TB24
30.0000 mg | ORAL_TABLET | Freq: Every day | ORAL | Status: DC
  Administered 2023-05-23 (×2): 30 mg via ORAL
  Filled 2023-05-22 (×2): qty 1

## 2023-05-22 MED ORDER — OXYCODONE HCL 5 MG PO TABS
10.0000 mg | ORAL_TABLET | Freq: Four times a day (QID) | ORAL | Status: DC | PRN
  Administered 2023-05-23: 10 mg via ORAL
  Filled 2023-05-22: qty 2

## 2023-05-22 MED ORDER — SODIUM CHLORIDE 0.9 % IV SOLN: 250.0000 mL | INTRAVENOUS | Status: DC | PRN

## 2023-05-22 MED ORDER — SODIUM CHLORIDE 0.9% FLUSH
3.0000 mL | Freq: Two times a day (BID) | INTRAVENOUS | Status: DC
  Administered 2023-05-22 – 2023-05-23 (×2): 3 mL via INTRAVENOUS

## 2023-05-22 MED ORDER — ONDANSETRON HCL 4 MG PO TABS: 4.0000 mg | ORAL_TABLET | ORAL | Status: DC | PRN

## 2023-05-22 MED ORDER — FUROSEMIDE 20 MG PO TABS
20.0000 mg | ORAL_TABLET | Freq: Every day | ORAL | Status: DC
  Administered 2023-05-22 – 2023-05-23 (×2): 20 mg via ORAL
  Filled 2023-05-22 (×2): qty 1

## 2023-05-22 MED ORDER — NIFEDIPINE ER OSMOTIC RELEASE 30 MG PO TB24: 30.0000 mg | ORAL_TABLET | Freq: Every day | ORAL | Status: DC

## 2023-05-22 MED ORDER — OXYCODONE HCL 5 MG PO TABS
5.0000 mg | ORAL_TABLET | Freq: Four times a day (QID) | ORAL | Status: DC | PRN
  Administered 2023-05-22 (×2): 5 mg via ORAL
  Filled 2023-05-22 (×2): qty 1

## 2023-05-22 MED ORDER — ACETAMINOPHEN 325 MG PO TABS
650.0000 mg | ORAL_TABLET | ORAL | Status: DC | PRN
  Administered 2023-05-23: 650 mg via ORAL
  Filled 2023-05-22: qty 2

## 2023-05-22 MED ORDER — DIPHENHYDRAMINE HCL 25 MG PO CAPS: 25.0000 mg | ORAL_CAPSULE | Freq: Four times a day (QID) | ORAL | Status: DC | PRN

## 2023-05-22 MED ORDER — METOCLOPRAMIDE HCL 10 MG PO TABS
10.0000 mg | ORAL_TABLET | Freq: Once | ORAL | Status: AC
  Administered 2023-05-23: 10 mg via ORAL
  Filled 2023-05-22: qty 1

## 2023-05-22 MED ORDER — BENZOCAINE-MENTHOL 20-0.5 % EX AERO
1.0000 | INHALATION_SPRAY | CUTANEOUS | Status: DC | PRN
  Administered 2023-05-23: 1 via TOPICAL
  Filled 2023-05-22: qty 56

## 2023-05-22 MED ORDER — ZOLPIDEM TARTRATE 5 MG PO TABS: 5.0000 mg | ORAL_TABLET | Freq: Every evening | ORAL | Status: DC | PRN

## 2023-05-22 MED ORDER — ONDANSETRON HCL 4 MG/2ML IJ SOLN: 4.0000 mg | INTRAMUSCULAR | Status: DC | PRN

## 2023-05-22 MED ORDER — IBUPROFEN 600 MG PO TABS
600.0000 mg | ORAL_TABLET | Freq: Four times a day (QID) | ORAL | Status: DC
  Administered 2023-05-22 – 2023-05-23 (×6): 600 mg via ORAL
  Filled 2023-05-22 (×7): qty 1

## 2023-05-22 MED ORDER — WITCH HAZEL-GLYCERIN EX PADS: 1.0000 | MEDICATED_PAD | CUTANEOUS | Status: DC | PRN

## 2023-05-22 MED ORDER — DIBUCAINE (PERIANAL) 1 % EX OINT
1.0000 | TOPICAL_OINTMENT | CUTANEOUS | Status: DC | PRN
  Administered 2023-05-23: 1 via RECTAL
  Filled 2023-05-22: qty 28

## 2023-05-22 NOTE — Progress Notes (Signed)
 At 2134 called re: elevated BP (talked to Annabell Key, MD/Kumar, MD).  They added Procardia . Just called again (2252) to confirm that Procardia  is to start tomorrow morning (currently scheduled to start 4/26@1000 ).  Heather Malone said to give a dose now, and leave as scheduled tomorrow morning at 10am.

## 2023-05-22 NOTE — Progress Notes (Signed)
 POSTPARTUM PROGRESS NOTE  Post Partum Day 1  Subjective:  Heather Malone is a 33 y.o. [id number] s/p SVD at [redacted]w[redacted]d.  She reports she is doing well. No acute events overnight. She denies any problems with ambulating, voiding or po intake. Denies nausea or vomiting.  Pain is moderately controlled.  Lochia is appropriate.  Objective: Blood pressure 112/80, pulse 65, temperature 97.6 F (36.4 C), temperature source Oral, resp. rate 18, height 5\' 2"  (1.575 m), weight 88.5 kg, last menstrual period 08/18/2022, SpO2 98%, unknown if currently breastfeeding.  Physical Exam:  General: alert, cooperative and no distress Chest: no respiratory distress Heart:regular rate, distal pulses intact Uterine Fundus: firm, appropriately tender DVT Evaluation: No calf swelling or tenderness Extremities: trace edema Skin: warm, dry  Recent Labs    05/21/23 1000 05/22/23 0526  HGB 10.3* 10.0*  HCT 31.5* 30.1*    Assessment/Plan: Heather Malone is a 33 y.o. [id number] s/p SVD at [redacted]w[redacted]d   PPD#1 - Doing well  Routine postpartum care  Mild postpartum blood loss anemia: asymptomatic  Start oral iron  supplementation  Contraception: BTS tomorrow Feeding: Breast Dispo: Plan for discharge tomorrow.   LOS: 1 day   Heather Eveland, MD OB Fellow  05/22/2023, 5:54 PM

## 2023-05-22 NOTE — Discharge Summary (Incomplete)
 Postpartum Discharge Summary  Date of Service updated***     Patient Name: Heather Malone DOB: [birth year] MRN: [id number]  Date of admission: 05/21/2023 Delivery date:05/21/2023 Delivering provider: Kabrina Christiano C Date of discharge: 05/22/2023  Admitting diagnosis: Indication for care in labor or delivery [O75.9] Intrauterine pregnancy: [redacted]w[redacted]d     Secondary diagnosis:  Principal Problem:   Indication for care in labor or delivery  Additional problems: ***    Discharge diagnosis: Term Pregnancy Delivered and Gestational Hypertension                                              Post partum procedures:{Postpartum procedures:23558} Augmentation: AROM, Pitocin , Cytotec , and IP Foley Complications: None  Hospital course: Induction of Labor With Vaginal Delivery   33 y.o. yo [id number] at [redacted]w[redacted]d was admitted to the hospital 05/21/2023 for induction of labor.  Indication for induction: Gestational hypertension.  Patient had an labor course that was uncomplicated. Membrane Rupture Time/Date: 3:24 PM,05/21/2023  Delivery Method:Vaginal, Spontaneous Operative Delivery:N/A Episiotomy: None Lacerations:    Details of delivery can be found in separate delivery note.  Patient had a postpartum course complicated by***. Patient is discharged home 05/22/23.  Newborn Data: Birth date:05/21/2023 Birth time:11:32 PM Gender:Female Living status:Living Apgars:9 ,9  Weight:   Magnesium  Sulfate received: {Mag received:30440022} BMZ received: No Rhophylac:N/A MMR:N/A T-DaP:Given prenatally Flu: Declined RSV Vaccine received: No Transfusion:{Transfusion received:30440034}  Immunizations received: Immunization History  Administered Date(s) Administered  . Tdap 08/01/2020, 04/08/2023    Physical exam  Vitals:   05/21/23 1549 05/21/23 1856 05/21/23 1939 05/21/23 2157  BP: 127/84 113/70 (!) 128/91 119/81  Pulse: 73 75 93 81  Resp:   17 17  Temp: 98 F (36.7 C) 98.1 F (36.7 C)  97.6 F  (36.4 C)  TempSrc: Oral Oral  Axillary  Weight:      Height:       General: {Exam; general:21111117} Lochia: {Desc; appropriate/inappropriate:30686::"appropriate"} Uterine Fundus: {Desc; firm/soft:30687} Incision: {Exam; incision:21111123} DVT Evaluation: {Exam; dvt:2111122} Labs: Lab Results  Component Value Date   WBC 4.7 05/21/2023   HGB 10.3 (L) 05/21/2023   HCT 31.5 (L) 05/21/2023   MCV 86.3 05/21/2023   PLT 221 05/21/2023      Latest Ref Rng & Units 05/21/2023   10:00 AM  CMP  Glucose 70 - 99 mg/dL 89   BUN 6 - 20 mg/dL <5   Creatinine 1.47 - 1.00 mg/dL 8.29   Sodium 562 - 130 mmol/L 135   Potassium 3.5 - 5.1 mmol/L 3.6   Chloride 98 - 111 mmol/L 106   CO2 22 - 32 mmol/L 20   Calcium  8.9 - 10.3 mg/dL 8.8   Total Protein 6.5 - 8.1 g/dL 6.3   Total Bilirubin 0.0 - 1.2 mg/dL 0.4   Alkaline Phos 38 - 126 U/L 51   AST 15 - 41 U/L 15   ALT 0 - 44 U/L 9    Edinburgh Score:    09/14/2020    8:29 AM  Edinburgh Postnatal Depression Scale Screening Tool  I have been able to laugh and see the funny side of things. 0  I have looked forward with enjoyment to things. 0  I have blamed myself unnecessarily when things went wrong. 2  I have been anxious or worried for no good reason. 2  I have felt scared or panicky  for no good reason. 2  Things have been getting on top of me. 2  I have been so unhappy that I have had difficulty sleeping. 0  I have felt sad or miserable. 2  I have been so unhappy that I have been crying. 1  The thought of harming myself has occurred to me. 0  Edinburgh Postnatal Depression Scale Total 11   No data recorded  After visit meds:  Allergies as of 05/22/2023   No Known Allergies   Med Rec must be completed prior to using this [facility]***        Discharge home in stable condition Infant Feeding: {Baby feeding:23562} Infant Disposition:{CHL IP OB HOME WITH ZOXWRU:04540} Discharge instruction: per After Visit Summary and Postpartum  booklet. Activity: Advance as tolerated. Pelvic rest for 6 weeks.  Diet: {OB JWJX:91478295} Future Appointments: Future Appointments  Date Time Provider Department Center  06/09/2023 10:00 AM [facility] CONSULTANT [facility]  07/01/2023 10:55 AM Derick Fleeting, CNM [facility]   Follow up Visit: Message to [facility] 4/25 to schedule with CNM Walker   Please schedule this patient for a In person postpartum visit in 4 weeks with the following provider: CNM Walker. Additional Postpartum F/U:BP check 1 week  High risk pregnancy complicated by: HTN and Migraine Delivery mode:  Vaginal, Spontaneous Anticipated Birth Control:  BTL   05/22/2023 Ebony Goldstein, MD

## 2023-05-22 NOTE — Discharge Summary (Signed)
 Postpartum Discharge Summary  Date of Service updated     Patient Name: Heather Malone DOB: [birth year] MRN: [id number]  Date of admission: 05/21/2023 Delivery date:05/21/2023 Delivering provider: CHUBB, CASEY C Date of discharge: 05/23/2023  Admitting diagnosis: Indication for care in labor or delivery [O75.9] Intrauterine pregnancy: [redacted]w[redacted]d     Secondary diagnosis:  Principal Problem:   Indication for care in labor or delivery Active Problems:   Alpha thalassemia silent carrier   Anemia in pregnancy   Elevated blood pressure complicating pregnancy in first trimester, antepartum   Supervision of high-risk pregnancy   History of severe pre-eclampsia  Additional problems: None    Discharge diagnosis: Term Pregnancy Delivered and Gestational Hypertension                                              Post partum procedures: None Augmentation: AROM and Pitocin  Complications: None  Hospital course: Induction of Labor With Vaginal Delivery   33 y.o. yo [id number] at [redacted]w[redacted]d was admitted to the hospital 05/21/2023 for induction of labor.  Indication for induction: Gestational hypertension.  Patient had an labor course that was uncomplicated. Membrane Rupture Time/Date: 3:24 PM,05/21/2023  Delivery Method:Vaginal, Spontaneous Operative Delivery:N/A Episiotomy: None Lacerations:  None Details of delivery can be found in separate delivery note.  Patient had a uncomplicated postpartum course.  Patient is discharged home 05/23/23.  Newborn Data: Birth date:05/21/2023 Birth time:11:32 PM Gender:Female Living status:Living Apgars:9 ,9  Weight:3510 g  Magnesium  Sulfate received: No BMZ received: No Rhophylac:N/A MMR:N/A T-DaP:Given prenatally Flu: Declined RSV Vaccine received: No Transfusion:No  Immunizations received: Immunization History  Administered Date(s) Administered   Tdap 08/01/2020, 04/08/2023    Physical exam  Vitals:   05/22/23 1511 05/22/23 1954 05/22/23 2129  05/23/23 0615  BP: 112/80 (!) 129/93 (!) 119/93 120/85  Pulse: 65 75 85 82  Resp: 18 18  18   Temp: 97.6 F (36.4 C) 98 F (36.7 C)  98.1 F (36.7 C)  TempSrc: Oral Oral  Oral  SpO2: 98% 100%  100%  Weight:      Height:       General: alert, cooperative, and no distress Lochia: appropriate Uterine Fundus: firm Incision: N/A DVT Evaluation: No evidence of DVT seen on physical exam. Labs: Lab Results  Component Value Date   WBC 12.8 (H) 05/22/2023   HGB 10.0 (L) 05/22/2023   HCT 30.1 (L) 05/22/2023   MCV 85.3 05/22/2023   PLT 231 05/22/2023      Latest Ref Rng & Units 05/21/2023   10:00 AM  CMP  Glucose 70 - 99 mg/dL 89   BUN 6 - 20 mg/dL <5   Creatinine 4.26 - 1.00 mg/dL 8.34   Sodium 196 - 222 mmol/L 135   Potassium 3.5 - 5.1 mmol/L 3.6   Chloride 98 - 111 mmol/L 106   CO2 22 - 32 mmol/L 20   Calcium  8.9 - 10.3 mg/dL 8.8   Total Protein 6.5 - 8.1 g/dL 6.3   Total Bilirubin 0.0 - 1.2 mg/dL 0.4   Alkaline Phos 38 - 126 U/L 51   AST 15 - 41 U/L 15   ALT 0 - 44 U/L 9    Edinburgh Score:    05/22/2023    7:35 AM  Edinburgh Postnatal Depression Scale Screening Tool  I have been able to laugh and see the funny  side of things. 0  I have looked forward with enjoyment to things. 0  I have blamed myself unnecessarily when things went wrong. 0  I have been anxious or worried for no good reason. 0  I have felt scared or panicky for no good reason. 2  Things have been getting on top of me. 0  I have been so unhappy that I have had difficulty sleeping. 0  I have felt sad or miserable. 1  I have been so unhappy that I have been crying. 0  The thought of harming myself has occurred to me. 0  Edinburgh Postnatal Depression Scale Total 3   Edinburgh Postnatal Depression Scale Total: 3   After visit meds:  Allergies as of 05/23/2023   No Known Allergies      Medication List     STOP taking these medications    multivitamin-prenatal 27-0.8 MG Tabs tablet        TAKE these medications    benzocaine -Menthol  20-0.5 % Aero Commonly known as: DERMOPLAST Apply 1 Application topically as needed for irritation (perineal discomfort).   Bonjesta  20-20 MG Tbcr Generic drug: Doxylamine -Pyridoxine ER Take 1 tablet by mouth at bedtime.   Butalbital -APAP-Caffeine  50-325-40 MG capsule Take 1-2 capsules by mouth every 6 (six) hours as needed for headache.   cetirizine  10 MG tablet Commonly known as: ZYRTEC  Take 1 tablet (10 mg total) by mouth daily.   cyclobenzaprine  10 MG tablet Commonly known as: FLEXERIL  Take 1 tablet (10 mg total) by mouth every 8 (eight) hours as needed for muscle spasms.   dibucaine 1 % Oint Commonly known as: NUPERCAINAL Place 1 Application rectally as needed for hemorrhoids.   ferrous sulfate  325 (65 FE) MG tablet Take 1 tablet (325 mg total) by mouth every other day. Start taking on: May 24, 2023   furosemide  20 MG tablet Commonly known as: LASIX  Take 1 tablet (20 mg total) by mouth daily.   ibuprofen  600 MG tablet Commonly known as: ADVIL  Take 1 tablet (600 mg total) by mouth every 6 (six) hours.   NIFEdipine  30 MG 24 hr tablet Commonly known as: ADALAT  CC Take 1 tablet (30 mg total) by mouth daily.   omeprazole  40 MG capsule Commonly known as: PRILOSEC Take 1 capsule (40 mg total) by mouth daily.   potassium chloride  SA 20 MEQ tablet Commonly known as: KLOR-CON  M Take 1 tablet (20 mEq total) by mouth daily.   witch hazel-glycerin  pad Commonly known as: TUCKS Apply 1 Application topically as needed for hemorrhoids.         Discharge home in stable condition Infant Feeding: Breast Infant Disposition:home with mother Discharge instruction: per After Visit Summary and Postpartum booklet. Activity: Advance as tolerated. Pelvic rest for 6 weeks.  Diet: routine diet Future Appointments: Future Appointments  Date Time Provider Department Center  05/29/2023  9:20 AM [facility] NURSE [facility]   06/09/2023 10:00 AM [facility] CONS[facility]ANT [facility]  07/01/2023 10:55 AM Derick Fleeting, CNM [facility]   Follow up Visit: Message to [facility] 4/25 to schedule with CNM Walker   Please schedule this patient for a In person postpartum visit in 4 weeks with the following provider: CNM Walker. Additional Postpartum F/U:BP check 1 week  High risk pregnancy complicated by: HTN and Migraine Delivery mode:  Vaginal, Spontaneous Anticipated Birth Control: Depo to bridge for outpatient IUD at postpartum visit.   05/23/2023 Ferdie Housekeeper, MD

## 2023-05-22 NOTE — Lactation Note (Signed)
 This note was copied from a baby's chart. Lactation Consultation Note  Patient Name: Heather Malone [id number] Date: 05/22/2023 Age:33 hours  Reason for consult: Initial assessment;Early term 37-38.6wks  P3, [redacted]w[redacted]d  Initial LC visit. Mother reports baby has been breastfeeding well. She is also supplementing with formula, per choice. Mother reports she breast fed her other two children for 3 months and struggled with low milk supply. She has met with a lactation consultant at [facility] and received prenatal breastfeeding education and has an OP LC appointment scheduled for May 13 for continued support.   Discussed getting breastfeeding off to a good start to promote milk production.  Discussed the process of milk production, "supply and demand" and the importance of breast stimulation and milk removal in order to make an optimal milk supply.  Discussed mother to breastfeed 8-12 times in 24 hours, skin to skin and breast feed before formula feeding.   If missed feedings at breast or substituting feeding with formula, advised to hand express and/or pump to remove milk from the breast.   Encouraged mother to call nurse/ LC for assistance with breastfeeding as needed. Mom made aware of O/P services, breastfeeding support groups, and our phone # for post-discharge questions.     Maternal Data Has patient been taught Hand Expression?: No Does the patient have breastfeeding experience prior to this delivery?: Yes How long did the patient breastfeed?: 3 months  Feeding Mother's Current Feeding Choice: Breast Milk and Formula   Interventions Interventions: Education;CDC milk storage guidelines;CDC Guidelines for Breast Pump Cleaning  Discharge Pump: Personal  Consult Status Consult Status: Follow-up Date: 05/23/23 Follow-up type: In-patient    Gearline Kell M 05/22/2023, 6:04 PM

## 2023-05-22 NOTE — Anesthesia Preprocedure Evaluation (Signed)
 Anesthesia Evaluation  Patient identified by MRN, date of birth, ID band Patient awake    Reviewed: Allergy & Precautions, NPO status , Patient's Chart, lab work & pertinent test results  History of Anesthesia Complications (+) Family history of anesthesia reaction  Airway Mallampati: II       Dental no notable dental hx. (+) Teeth Intact   Pulmonary asthma , former smoker   Pulmonary exam normal breath sounds clear to auscultation       Cardiovascular hypertension, Normal cardiovascular exam Rhythm:Regular Rate:Normal     Neuro/Psych  Headaches  Anxiety        GI/Hepatic Neg liver ROS,GERD  ,,  Endo/Other  Obesity  Renal/GU negative Renal ROS  negative genitourinary   Musculoskeletal negative musculoskeletal ROS (+)    Abdominal  (+) + obese  Peds  Hematology  (+) Blood dyscrasia, anemia   Anesthesia Other Findings   Reproductive/Obstetrics                              Anesthesia Physical Anesthesia Plan  ASA: 2  Anesthesia Plan: Spinal   Post-op Pain Management: Minimal or no pain anticipated   Induction: Intravenous  PONV Risk Score and Plan: 4 or greater and Treatment may vary due to age or medical condition and Ondansetron   Airway Management Planned: Natural Airway and Simple Face Mask  Additional Equipment: None  Intra-op Plan:   Post-operative Plan:   Informed Consent: I have reviewed the patients History and Physical, chart, labs and discussed the procedure including the risks, benefits and alternatives for the proposed anesthesia with the patient or authorized representative who has indicated his/her understanding and acceptance.     Dental advisory given  Plan Discussed with: CRNA and Anesthesiologist  Anesthesia Plan Comments:          Anesthesia Quick Evaluation

## 2023-05-23 ENCOUNTER — Other Ambulatory Visit (HOSPITAL_COMMUNITY): Payer: Self-pay

## 2023-05-23 ENCOUNTER — Encounter (HOSPITAL_COMMUNITY): Payer: Self-pay | Admitting: Obstetrics and Gynecology

## 2023-05-23 ENCOUNTER — Encounter (HOSPITAL_COMMUNITY): Payer: Self-pay | Admitting: Anesthesiology

## 2023-05-23 ENCOUNTER — Encounter (HOSPITAL_COMMUNITY)
Admission: RE | Disposition: A | Discharge status: Home / Self Care | Payer: Self-pay | Source: Home / Self Care | Attending: Obstetrics and Gynecology

## 2023-05-23 LAB — BIRTH TISSUE RECOVERY COLLECTION (PLACENTA DONATION)

## 2023-05-23 SURGERY — LIGATION, FALLOPIAN TUBE, POSTPARTUM
Anesthesia: Choice | Laterality: Bilateral

## 2023-05-23 MED ORDER — MEDROXYPROGESTERONE ACETATE 150 MG/ML IM SUSP
150.0000 mg | Freq: Once | INTRAMUSCULAR | Status: DC
  Filled 2023-05-23: qty 1

## 2023-05-23 MED ORDER — BENZOCAINE-MENTHOL 20-0.5 % EX AERO
1.0000 | INHALATION_SPRAY | CUTANEOUS | 0 refills | Status: DC | PRN
  Filled 2023-05-23: qty 56, fill #0

## 2023-05-23 MED ORDER — IBUPROFEN 600 MG PO TABS
600.0000 mg | ORAL_TABLET | Freq: Four times a day (QID) | ORAL | 0 refills | Status: DC
  Filled 2023-05-23: qty 30, 8d supply, fill #0

## 2023-05-23 MED ORDER — FUROSEMIDE 20 MG PO TABS
20.0000 mg | ORAL_TABLET | Freq: Every day | ORAL | 0 refills | Status: DC
  Filled 2023-05-23: qty 5, 5d supply, fill #0

## 2023-05-23 MED ORDER — POTASSIUM CHLORIDE CRYS ER 20 MEQ PO TBCR
20.0000 meq | EXTENDED_RELEASE_TABLET | Freq: Every day | ORAL | 0 refills | Status: DC
  Filled 2023-05-23: qty 5, 5d supply, fill #0

## 2023-05-23 MED ORDER — NIFEDIPINE ER 30 MG PO TB24
30.0000 mg | ORAL_TABLET | Freq: Every day | ORAL | 0 refills | Status: DC
Start: 2023-05-23 — End: 2023-08-17
  Filled 2023-05-23: qty 30, 30d supply, fill #0

## 2023-05-23 MED ORDER — MEDROXYPROGESTERONE ACETATE 150 MG/ML IM SUSP
150.0000 mg | Freq: Once | INTRAMUSCULAR | Status: AC
  Administered 2023-05-23: 150 mg via INTRAMUSCULAR
  Filled 2023-05-23: qty 1

## 2023-05-23 MED ORDER — FERROUS SULFATE 325 (65 FE) MG PO TABS
325.0000 mg | ORAL_TABLET | ORAL | 3 refills | Status: DC
  Filled 2023-05-23: qty 30, 60d supply, fill #0

## 2023-05-23 MED ORDER — DIBUCAINE (PERIANAL) 1 % EX OINT
1.0000 | TOPICAL_OINTMENT | CUTANEOUS | 0 refills | Status: DC | PRN
  Filled 2023-05-23: qty 28, fill #0

## 2023-05-23 MED ORDER — WITCH HAZEL-GLYCERIN EX PADS
1.0000 | MEDICATED_PAD | CUTANEOUS | 12 refills | Status: DC | PRN
  Filled 2023-05-23: qty 40, 40d supply, fill #0

## 2023-05-23 NOTE — Lactation Note (Addendum)
 This note was copied from a baby's chart. Lactation Consultation Note  Patient Name: Heather Malone [id number] Date: 05/23/2023 Age:33 hours Reason for consult: Follow-up assessment  P3, 38 wks, @ 37 hrs of life. Per mom baby doing well @ breast. Per mom anticipated DC tomorrow, R/T blood pressures- in case of DC- Encouraged mom to keep working on big mouth latch with baby and use EBM or coconut oil after each feed. Discussed cluster feeding overnight/ early morning brings in our milk supply, shared expectations of milk coming in. Highlighted risk of engorgement. Discussed hand pump/express to soften breasts, motrin  as anti-inflammatory, and ice packs for 10-20 minutes post feed/pumping if still over-full is the best treatments for inflamed/engorged breasts. Hand provided, encouraged best tool for softening swollen/engorged breasts.   Maternal Data Does the patient have breastfeeding experience prior to this delivery?: Yes How long did the patient breastfeed?: she breast fed her other two children for 3 months and struggled with low milk supply. (she breast fed her other two children for 3 months and struggled with low milk supply.)  Feeding Mother's Current Feeding Choice: Breast Milk and Formula Nipple Type: Slow - flow   Lactation Tools Discussed/Used Tools: Pump Breast pump type: Manual Pump Education: Milk Storage  Interventions Interventions: Breast feeding basics reviewed;Hand express;Breast compression;Expressed milk;Coconut oil;Hand pump;Education;LC Services brochure;CDC milk storage guidelines  Discharge Discharge Education: Engorgement and breast care Pump: Manual;Hands Free;Personal  Consult Status Consult Status: Complete Date: 05/24/23 Follow-up type: In-patient    Heather Malone 05/23/2023, 2:11 PM

## 2023-05-23 NOTE — Progress Notes (Signed)
 Went to patient's room to consent for tubal ligation.  Consented patient for procedure.  Soon after leaving the room was called by nursing staff who reports the patient has elected to not go through with a tubal ligation.  Returned to the patient's room.  She reports that she is not interested in the tubal ligation at this time.  Discussed other options given she is breast-feeding.  Patient has elected for outpatient IUD.  She would like Depo as a bridge.  She had no further questions or concerns.

## 2023-05-23 NOTE — Progress Notes (Signed)
 CSW received consult for hx of Anxiety. CSW met with MOB to offer support and complete assessment. When CSW entered room, MOB was observed sitting in hospital bed preparing to feed infant. MOB's mother was present sitting nearby. CSW introduced self and requested to meet with MOB alone. MOB's mother left room. CSW explained reason for consult. MOB presented as calm, was agreeable to CSW visit, and remained engaged during encounter.  CSW inquired how MOB is feeling emotionally since infant's arrival. MOB reports that she is feeling "good" despite a challenging labor. CSW inquired about MOB's mental health history. MOB acknowledged a history of anxiety, reporting she was initially diagnosed with anxiety around 33 years old. MOB reported endorsing increased symptoms of anxiety during pregnancy, which she largely attributed to her pregnancy being unexpected. MOB recalled feelings of  overwhelm. CSW inquired about current treatment. MOB reports she is not prescribed mental health medication and is not current with a therapist. MOB expressed interest in outpatient mental health resources, which CSW provided. CSW also provided review of integrated behavioral health therapy option through her OBGYN, [facility] if warranted. MOB denied a history of postpartum depression/anxiety after the birth of her other 2 children. CSW inquired about supports. MOB identified FOB, her mother, father, and close friends as supports. MOB denied current SI/HI/domestic violence.   CSW provided education regarding the baby blues period vs. perinatal mood disorders, discussed treatment and gave resources for mental health follow up if concerns arise.  CSW recommends self-evaluation during the postpartum time period using the New Mom Checklist from Postpartum Progress and encouraged MOB to contact a medical professional if symptoms are noted at any time.    MOB reports she has all needed items for infant, including a car seat and  bassinet. MOB has chosen [facility] for infant's follow up care.  CSW provided review of Sudden Infant Death Syndrome (SIDS) precautions.    CSW identifies no further need for intervention and no barriers to discharge at this time.  Signed,  Elizabeth Gulling, MSW, LCSWA, LCASA 2023/09/07 1:56 PM

## 2023-05-25 ENCOUNTER — Ambulatory Visit

## 2023-05-27 ENCOUNTER — Encounter: Payer: Self-pay | Admitting: Certified Nurse Midwife

## 2023-05-29 ENCOUNTER — Encounter: Payer: Self-pay | Admitting: General Practice

## 2023-05-29 ENCOUNTER — Other Ambulatory Visit: Payer: Self-pay

## 2023-05-29 ENCOUNTER — Telehealth: Payer: Self-pay | Admitting: Lactation Services

## 2023-05-29 ENCOUNTER — Ambulatory Visit: Admitting: General Practice

## 2023-05-29 VITALS — BP 125/89 | HR 73 | Ht 62.0 in | Wt 172.0 lb

## 2023-05-29 DIAGNOSIS — Z0130 Encounter for examination of blood pressure without abnormal findings: Secondary | ICD-10-CM

## 2023-05-29 NOTE — Telephone Encounter (Signed)
 Calling patient per RN request due to concern with lump in lower quadrant of right breast that has been there since 3rd trimester. Patient stopped wearing under wire and concern has not been resolved. LC called unable to leave VM due to mailbox being full. Notified patients provider.

## 2023-05-29 NOTE — Progress Notes (Signed)
 Patient presents to office today for blood pressure check following vaginal delivery on 4/24. Hx significant for GHTN. She has finished her course of Lasix  & Kdur and takes Nifedipine  but hasn't for the past couple of days because she's been busy with the baby and her other children. Had a headache yesterday but seemed similar to when she gets migraines- no dizziness or blurry vision. BP 125/89 today. Recommended she continue the Nifedipine  until pp visit on 6/4 with Jamilla. Patient will reach out with any questions/concerns.   Jewelene Morton RN BSN 05/29/23

## 2023-06-08 ENCOUNTER — Telehealth: Payer: Self-pay | Admitting: Lactation Services

## 2023-06-08 NOTE — Telephone Encounter (Signed)
 Called patient to confirm Lactation appointment for 5/13 at 10 am. Patient did not answer, mailbox full so not able to leave a message.

## 2023-06-09 ENCOUNTER — Encounter

## 2023-06-17 ENCOUNTER — Encounter: Payer: Self-pay | Admitting: Certified Nurse Midwife

## 2023-06-17 ENCOUNTER — Ambulatory Visit: Admitting: Certified Nurse Midwife

## 2023-06-17 VITALS — BP 129/86 | HR 76

## 2023-06-17 DIAGNOSIS — Z3009 Encounter for other general counseling and advice on contraception: Secondary | ICD-10-CM

## 2023-06-17 DIAGNOSIS — O906 Postpartum mood disturbance: Secondary | ICD-10-CM | POA: Diagnosis not present

## 2023-06-17 MED ORDER — SERTRALINE HCL 25 MG PO TABS: 25.0000 mg | ORAL_TABLET | Freq: Every day | ORAL | 6 refills | Status: DC

## 2023-06-17 NOTE — Progress Notes (Signed)
  History:  [name] is a 33 y.o. [phone] who presents to clinic today for assessment of her postpartum mood disturbance and to discuss how to handle her interval tubal.   She has a history of anxiety which was somewhat well controlled, but did get worse with pregnancy (given difficulties of the pregnancy) and has only gotten progressively worse since delivery. She is easily tearful and feels constant anxiety.  Did not have her tubal after delivery because she did not want to be awake, amenable to rescheduling with Dr. Vallarie Gauze.  The following portions of the patient's history were reviewed and updated as appropriate: allergies, current medications, family history, past medical history, social history, past surgical history and problem list.  Review of Systems:  Pertinent items noted in HPI and remainder of comprehensive ROS otherwise negative.   Objective:  Physical Exam BP 129/86   Pulse 76   LMP 08/18/2022 (Exact Date)   Breastfeeding No  Physical Exam Vitals and nursing note reviewed.  Constitutional:      Appearance: Normal appearance. She is normal weight. She is not ill-appearing.  Cardiovascular:     Rate and Rhythm: Normal rate.     Pulses: Normal pulses.  Pulmonary:     Effort: Pulmonary effort is normal.  Musculoskeletal:        General: Normal range of motion.  Skin:    General: Skin is warm and dry.     Capillary Refill: Capillary refill takes less than 2 seconds.  Neurological:     Mental Status: She is alert and oriented to person, place, and time.  Psychiatric:        Mood and Affect: Mood normal.        Behavior: Behavior normal.      Labs and Imaging No results found for this or any previous visit (from the past 24 hours).  No results found.   Assessment & Plan:  1. Postpartum mood disturbance (Primary) - Has been on medication before but does not remember what. Does remember it being helpful. Amenable to starting low dose zoloft , will  also discuss referral to [facility]. - sertraline  (ZOLOFT ) 25 MG tablet; Take 1 tablet (25 mg total) by mouth daily.  Dispense: 30 tablet; Refill: 6  2. Unwanted fertility - Tubal consent re-signed, sent message to scheduler.  Follow up for postpartum visit.  Derick Fleeting, [place] 06/20/2023 2:13 PM

## 2023-07-01 ENCOUNTER — Other Ambulatory Visit: Payer: Self-pay | Admitting: Certified Nurse Midwife

## 2023-07-01 ENCOUNTER — Other Ambulatory Visit: Payer: Self-pay

## 2023-07-01 ENCOUNTER — Ambulatory Visit: Admitting: Certified Nurse Midwife

## 2023-07-01 ENCOUNTER — Encounter: Payer: Self-pay | Admitting: Certified Nurse Midwife

## 2023-07-01 DIAGNOSIS — O906 Postpartum mood disturbance: Secondary | ICD-10-CM

## 2023-07-01 DIAGNOSIS — N6313 Unspecified lump in the right breast, lower outer quadrant: Secondary | ICD-10-CM

## 2023-07-01 DIAGNOSIS — Z8759 Personal history of other complications of pregnancy, childbirth and the puerperium: Secondary | ICD-10-CM

## 2023-07-01 NOTE — Progress Notes (Signed)
 Post Partum Visit Note  Heather Malone is a 33 y.o. [phone] female who presents for a postpartum visit. She is [redacted]w[redacted]d postpartum following a normal spontaneous vaginal delivery.  I have fully reviewed the prenatal and intrapartum course. The delivery was at 38 gestational weeks.  Anesthesia: none. Postpartum course has been uncomplicated, feeling much less anxious without ever starting the zoloft . Baby is doing well. Baby is feeding by bottle - Similac Advance. Bleeding varies between non and light, red/brown color. Bowel function is normal. Bladder function is normal. Patient is sexually active. Contraception method is planned interval BTL. Postpartum depression screening: negative.   Upstream - 07/01/23 1328       Pregnancy Intention Screening   Does the patient want to become pregnant in the next year? No    Does the patient's partner want to become pregnant in the next year? No    Would the patient like to discuss contraceptive options today? N/A      Contraception Wrap Up   Current Method Female Sterilization    End Method Female Sterilization    Contraception Counseling Provided No    How was the end contraceptive method provided? N/A   Message sent to scheduler for assistance.           The pregnancy intention screening data noted above was reviewed. Potential methods of contraception were discussed. The patient elected to proceed with Female Sterilization.   Edinburgh Postnatal Depression Scale - 07/01/23 1310       Edinburgh Postnatal Depression Scale:  In the Past 7 Days   I have been able to laugh and see the funny side of things. 0    I have looked forward with enjoyment to things. 0    I have blamed myself unnecessarily when things went wrong. 2    I have been anxious or worried for no good reason. 1    I have felt scared or panicky for no good reason. 2    Things have been getting on top of me. 1    I have been so unhappy that I have had difficulty sleeping. 0     I have felt sad or miserable. 1    I have been so unhappy that I have been crying. 1    The thought of harming myself has occurred to me. 0    Edinburgh Postnatal Depression Scale Total 8            Health Maintenance Due  Topic Date Due   Pneumococcal Vaccine 53-11 Years old (1 of 2 - PCV) Never done   The following portions of the patient's history were reviewed and updated as appropriate: allergies, current medications, past family history, past medical history, past social history, past surgical history, and problem list.  Review of Systems Pertinent items noted in HPI and remainder of comprehensive ROS otherwise negative.  Objective:  BP (!) 119/92   Pulse 64   Wt 175 lb 8 oz (79.6 kg)   LMP 08/18/2022 (Exact Date)   Breastfeeding No   BMI 32.10 kg/m    Constitutional: Alert, oriented female in no physical distress.  HEENT: PERRLA Skin: normal color and turgor, no rash Cardiovascular: normal rate & rhythm Respiratory: normal effort, no problems with respiration noted GI: Abd soft, non-tender MS: Extremities nontender, no edema, normal ROM Neurologic: Alert and oriented x 4.  GU: no CVA tenderness Pelvic: exam deferred, will come back in a month for BP check and pap Breasts:  still a small lump in the right lower outside quadrant of the breast, no longer breastfeeding  Assessment:  1. Postpartum care and examination (Primary) - Recovering well  2. Mass of lower outer quadrant of right breast - US  LIMITED ULTRASOUND INCLUDING AXILLA RIGHT BREAST; Future  3. History of severe pre-eclampsia - BP normal today, will continue meds and recheck in a month  4. Postpartum mood disturbance - Never started Zoloft  but  mood improved drastically after last visit   Plan:   Essential components of care per ACOG recommendations:  1.  Mood and well being: Patient with negative depression screening today. Reviewed local resources for support.  - Patient tobacco use? No.    - hx of drug use? No.    2. Infant care and feeding:  -Patient currently breastmilk feeding? No.  -Social determinants of health (SDOH) reviewed in EPIC. No concerns  3. Sexuality, contraception and birth spacing - Patient does not want a pregnancy in the next year.  Desired family size is 3 children.  - Reviewed reproductive life planning. Reviewed contraceptive methods based on pt preferences and effectiveness.  Patient desired Female Sterilization today.   - Discussed birth spacing of 18 months  4. Sleep and fatigue -Encouraged family/partner/community support of 4 hrs of uninterrupted sleep to help with mood and fatigue  5. Physical Recovery  - Discussed patients delivery and complications. She describes her labor as mixed. - Patient had a Vaginal, no problems at delivery. Patient had no laceration. Perineal healing reviewed. Patient expressed understanding - Patient has urinary incontinence? No. - Patient is safe to resume physical and sexual activity  6.  Health Maintenance - HM due items addressed Yes - Last pap smear  Diagnosis  Date Value Ref Range Status  04/12/2020   Final   - Negative for intraepithelial lesion or malignancy (NILM)  - Pap smear not done at today's visit.  -Breast Cancer screening indicated? No.   7. Chronic Disease/Pregnancy Condition follow up: Hypertension - PCP follow up  Derick Fleeting, CNM [facility] [employer], [facility]

## 2023-07-27 ENCOUNTER — Other Ambulatory Visit: Payer: Self-pay | Admitting: Certified Nurse Midwife

## 2023-07-27 ENCOUNTER — Ambulatory Visit
Admission: RE | Admit: 2023-07-27 | Discharge: 2023-07-27 | Disposition: A | Discharge status: Home / Self Care | Source: Ambulatory Visit | Attending: Certified Nurse Midwife | Admitting: Certified Nurse Midwife

## 2023-07-27 ENCOUNTER — Ambulatory Visit
Admission: RE | Admit: 2023-07-27 | Discharge: 2023-07-27 | Disposition: A | Discharge status: Home / Self Care | Source: Ambulatory Visit | Attending: Certified Nurse Midwife

## 2023-07-27 DIAGNOSIS — N6313 Unspecified lump in the right breast, lower outer quadrant: Secondary | ICD-10-CM

## 2023-07-27 DIAGNOSIS — N6310 Unspecified lump in the right breast, unspecified quadrant: Secondary | ICD-10-CM

## 2023-07-27 DIAGNOSIS — N6315 Unspecified lump in the right breast, overlapping quadrants: Secondary | ICD-10-CM | POA: Diagnosis not present

## 2023-07-29 ENCOUNTER — Ambulatory Visit
Admission: RE | Admit: 2023-07-29 | Discharge: 2023-07-29 | Disposition: A | Discharge status: Home / Self Care | Source: Ambulatory Visit | Attending: Certified Nurse Midwife | Admitting: Certified Nurse Midwife

## 2023-07-29 ENCOUNTER — Other Ambulatory Visit: Payer: Self-pay

## 2023-07-29 ENCOUNTER — Ambulatory Visit: Admitting: Certified Nurse Midwife

## 2023-07-29 ENCOUNTER — Other Ambulatory Visit (HOSPITAL_COMMUNITY)
Admission: RE | Admit: 2023-07-29 | Discharge: 2023-07-29 | Disposition: A | Discharge status: Home / Self Care | Source: Ambulatory Visit | Attending: Certified Nurse Midwife | Admitting: Certified Nurse Midwife

## 2023-07-29 VITALS — BP 121/82 | HR 77 | Wt 171.6 lb

## 2023-07-29 DIAGNOSIS — N6313 Unspecified lump in the right breast, lower outer quadrant: Secondary | ICD-10-CM

## 2023-07-29 DIAGNOSIS — N6310 Unspecified lump in the right breast, unspecified quadrant: Secondary | ICD-10-CM

## 2023-07-29 DIAGNOSIS — Z1151 Encounter for screening for human papillomavirus (HPV): Secondary | ICD-10-CM

## 2023-07-29 DIAGNOSIS — Z1331 Encounter for screening for depression: Secondary | ICD-10-CM

## 2023-07-29 DIAGNOSIS — Z01419 Encounter for gynecological examination (general) (routine) without abnormal findings: Secondary | ICD-10-CM | POA: Diagnosis not present

## 2023-07-29 DIAGNOSIS — N6315 Unspecified lump in the right breast, overlapping quadrants: Secondary | ICD-10-CM | POA: Diagnosis not present

## 2023-07-29 DIAGNOSIS — Z124 Encounter for screening for malignant neoplasm of cervix: Secondary | ICD-10-CM | POA: Diagnosis present

## 2023-07-29 HISTORY — PX: BREAST BIOPSY: SHX20

## 2023-07-29 NOTE — Progress Notes (Signed)
 ANNUAL EXAM Patient name: Heather Malone MRN [id number]  Date of birth: 1990/08/08 Chief Complaint:   Gynecologic Exam  History of Present Illness:   Heather Malone is a 33 y.o. [phone] African-American female being seen today for a routine annual exam.  Current complaints: None, having R breast biopsy later today. Will need Depo in 3 weeks.  No LMP recorded, got Depo prior to discharge after delivery.  Last pap 04/12/20. Results were: NILM w/ HRHPV negative. H/O abnormal pap: no Last mammogram: 07/27/23. Results were: abnormal R breast mass at 6 o'clock. Family h/o breast cancer: no Last colonoscopy: Never (age). Results were: N/A. Family h/o colorectal cancer: no     04/29/2023    2:16 PM 03/13/2023    3:09 PM 01/13/2023    2:38 PM 12/16/2022    1:05 PM 11/05/2022    4:59 PM  Depression screen PHQ 2/9  Decreased Interest 0 0 1 1 1   Down, Depressed, Hopeless 0 0 0 0 1  PHQ - 2 Score 0 0 1 1 2   Altered sleeping   1 2 2   Tired, decreased energy   1 2 2   Change in appetite   0 0 0  Feeling bad or failure about yourself    0 0 0  Trouble concentrating   0 0 0  Moving slowly or fidgety/restless   0 0 0  Suicidal thoughts   0 0 0  PHQ-9 Score   3 5 6   Difficult doing work/chores   Somewhat difficult Somewhat difficult         11/05/2022    4:59 PM 07/10/2021    2:58 PM 12/03/2020    2:45 PM 10/02/2020   10:40 AM  GAD 7 : Generalized Anxiety Score  Nervous, Anxious, on Edge 2 0 0 2  Control/stop worrying 1 0 1 1  Worry too much - different things 1 0 1 1  Trouble relaxing 0 0 0 0  Restless 0 0 0 0  Easily annoyed or irritable 1 0 1 1  Afraid - awful might happen 0 0 0 0  Total GAD 7 Score 5 0 3 5   Review of Systems:   Pertinent items are noted in HPI Denies any headaches, blurred vision, fatigue, shortness of breath, chest pain, abdominal pain, abnormal vaginal discharge/itching/odor/irritation, problems with periods, bowel movements, urination, or intercourse unless  otherwise stated above.  Pertinent History Reviewed:  Reviewed past medical,surgical, social and family history.  Reviewed problem list, medications and allergies.  Physical Assessment:   Vitals:   07/29/23 0857  BP: 121/82  Pulse: 77  Weight: 171 lb 9.6 oz (77.8 kg)   Body mass index is 31.39 kg/m.   Physical Examination:  General appearance - well appearing, and in no distress Mental status - alert, oriented to person, place, and time Psych:  She has a normal mood and affect Skin - warm and dry, normal color, no suspicious lesions noted Chest - effort normal, no problems with respiration noted Heart - normal rate and regular rhythm Neck:  midline trachea, no thyromegaly or nodules Breasts - breasts appear normal Abdomen - soft, nontender, nondistended, no masses or organomegaly Pelvic - VULVA: normal appearing vulva with no masses, tenderness or lesions   VAGINA: normal appearing vagina with normal color and discharge, no lesions   CERVIX: normal appearing cervix without discharge or lesions, no CMT Thin prep pap is done with HR HPV cotesting Extremities:  No swelling or varicosities noted  Chaperone present for exam  No results found for this or any previous visit (from the past 24 hours).  Assessment & Plan:  1. Encounter for annual routine gynecological examination (Primary) - Will follow up results of pap smear and manage accordingly. - Still taking nifedipine , will wait until biopsy complete to trial coming off meds - Routine preventative health maintenance measures emphasized.  - Colonoscopy: @ 33yo, or sooner if problems  2. Mass of lower outer quadrant of right breast - Reviewed images and biopsy procedure with pt, reassurance given - Breast biopsy scheduled  3. Papanicolaou smear for cervical cancer screening - Cytology - PAP( [place])  No orders of the defined types were placed in this encounter.  Meds: No orders of the defined types were placed in  this encounter.   Follow-up: Return in about 3 weeks (around 08/19/2023) for DEPO.  Cornell JONELLE Finder, CNM 07/29/2023 9:28 AM

## 2023-07-30 LAB — SURGICAL PATHOLOGY

## 2023-07-31 ENCOUNTER — Ambulatory Visit: Payer: Self-pay | Admitting: Certified Nurse Midwife

## 2023-08-03 LAB — CYTOLOGY - PAP
Chlamydia: NEGATIVE
Comment: NEGATIVE
Comment: NEGATIVE
Comment: NEGATIVE
Comment: NORMAL
Diagnosis: NEGATIVE
High risk HPV: NEGATIVE
Neisseria Gonorrhea: NEGATIVE
Trichomonas: NEGATIVE

## 2023-08-05 ENCOUNTER — Telehealth: Payer: Self-pay

## 2023-08-05 NOTE — Telephone Encounter (Signed)
 I contacted patient to see if she is available for surgery w/ Dr. Cleatus on 10/13/23 at 1 pm/[facility]. Patient agreed to surgery details and will arrive at [facility] at 11 am. I provided pre-op instructions and surgery details over the phone. Patient confirmed understanding.

## 2023-08-10 ENCOUNTER — Ambulatory Visit: Payer: Self-pay | Admitting: General Surgery

## 2023-08-10 DIAGNOSIS — D241 Benign neoplasm of right breast: Secondary | ICD-10-CM | POA: Diagnosis not present

## 2023-08-10 MED ORDER — KETOROLAC TROMETHAMINE 15 MG/ML IJ SOLN: 15.0000 mg | INTRAMUSCULAR | Status: AC

## 2023-08-10 NOTE — Progress Notes (Signed)
 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 Tyronne Vannie to evaluate her for a right breast mass.  The patient is a 33 year old black female who first noticed a small mass on the underside of the right breast during her pregnancy.  She gave birth about 12 weeks ago.  She is no longer breast-feeding.  Since that time the mass has gotten smaller but she still can feel it a little bit.  She denies any other breast pain.  She has had no discharge from the nipple.  She has no family history of breast cancer.  She does have some underlying asthma and smokes.  Her mammogram and ultrasound showed a 1 cm mass in the underside of the right breast.  The lymph nodes looked normal.  The mass was biopsied same back as an adenoma myoepithelioma.    Review of Systems: A complete review of systems was obtained from the patient.  I have reviewed this information and discussed as appropriate with the patient.  See HPI as well for other ROS.  ROS   Medical History: Past Medical History:  Diagnosis Date  . Anemia   . Anxiety   . Asthma, unspecified asthma severity, unspecified whether complicated, unspecified whether persistent (HHS-HCC)   . COVID-19 06/2020  . GERD (gastroesophageal reflux disease)     Patient Active Problem List  Diagnosis  . Adenomyoepithelioma of right breast    Past Surgical History:  Procedure Laterality Date  . TONSILLECTOMY       No Known Allergies  Current Outpatient Medications on File Prior to Visit  Medication Sig  Dispense Refill  . omeprazole  (PRILOSEC) 20 MG DR capsule     . PNV/ferrous fum/docusate/folic (PRENATAL VIT-FE FUM-FA-DSS ORAL) Take by mouth     No current facility-administered medications on file prior to visit.    Family History  Problem Relation Age of Onset  . High blood pressure (Hypertension) Mother   . Coronary Artery Disease (Blocked arteries around heart) Mother   . Diabetes Maternal Grandmother   . High blood pressure (Hypertension) Maternal Grandmother   . Diabetes Maternal Grandfather   . High blood pressure (Hypertension) Maternal Grandfather   . Diabetes Paternal Grandmother   . High blood pressure (Hypertension) Paternal Grandmother   . Diabetes Paternal Grandfather   . High blood pressure (Hypertension) Paternal Grandfather      Social History   Tobacco Use  Smoking Status Former  Smokeless Tobacco Former     Social History   Socioeconomic History  . Marital status: Single  Tobacco Use  . Smoking status: Former  . Smokeless tobacco: Former  Substance and Sexual Activity  . Drug use: Never   Social Drivers of Health   Food Insecurity: No Food Insecurity (07/29/2023)   Received from [facility]   Hunger Vital Sign   . Within the past 12 months, you worried that your food would run out before you got the money to buy more.: Never true   . Within the past 12 months, the food you bought  just didn't last and you didn't have money to get more.: Never true  Transportation Needs: No Transportation Needs (07/29/2023)   Received from [facility] - Transportation   . In the past 12 months, has lack of transportation kept you from medical appointments or from getting medications?: No   . In the past 12 months, has lack of transportation kept you from meetings, work, or from getting things needed for daily living?: No    Objective:   Vitals:   08/10/23 1352  BP: 116/78  Pulse: 92  Temp: 36.7 C (98 F)  SpO2: 95%  Weight: 78.3 kg (172 lb 9.6 oz)   Height: 157.5 cm (5' 2)  PainSc: 0-No pain    Body mass index is 31.57 kg/m.  Physical Exam Vitals reviewed.  Constitutional:      General: She is not in acute distress.    Appearance: Normal appearance.  HENT:     Head: Normocephalic and atraumatic.     Right Ear: External ear normal.     Left Ear: External ear normal.     Nose: Nose normal.     Mouth/Throat:     Mouth: Mucous membranes are moist.     Pharynx: Oropharynx is clear.   Eyes:     General: No scleral icterus.    Extraocular Movements: Extraocular movements intact.     Conjunctiva/sclera: Conjunctivae normal.     Pupils: Pupils are equal, round, and reactive to light.    Cardiovascular:     Rate and Rhythm: Normal rate and regular rhythm.     Pulses: Normal pulses.     Heart sounds: Normal heart sounds.  Pulmonary:     Effort: Pulmonary effort is normal. No respiratory distress.     Breath sounds: Normal breath sounds.  Abdominal:     General: Bowel sounds are normal.     Palpations: Abdomen is soft.     Tenderness: There is no abdominal tenderness.   Musculoskeletal:        General: No swelling, tenderness or deformity. Normal range of motion.     Cervical back: Normal range of motion and neck supple.   Skin:    General: Skin is warm and dry.     Coloration: Skin is not jaundiced.   Neurological:     General: No focal deficit present.     Mental Status: She is alert and oriented to person, place, and time.   Psychiatric:        Mood and Affect: Mood normal.        Behavior: Behavior normal.      Breast: There is no palpable mass in either breast.  I find it difficult to feel the same thing she is feeling.  There is no palpable axillary, supraclavicular, or cervical lymphadenopathy.  Labs, Imaging and Diagnostic Testing:   Assessment and Plan:     Diagnoses and all orders for this visit:  Adenomyoepithelioma of right breast -     CCS Case Posting Request; Future    The patient  appears to have an adenoma myoepithelioma in the lower portion of the right breast that measures about 1 cm.  Although these lesions are benign they can have some malignant potential and a tendency to recur.  Because of this the recommendation is to have this area completely removed.  She would also like to have this done.  I have discussed with her in detail the risks and benefits of the operation as well  as some of the technical aspects including the use of a radioactive seed for localization and she understands and wishes to proceed.  We will move forward with surgical scheduling.    DEWARD GARNETTE NULL, MD    I spent a total of 45 minutes in both face-to-face and non-face-to-face activities, excluding procedures performed, for this visit on the date of this encounter.

## 2023-08-12 ENCOUNTER — Other Ambulatory Visit: Payer: Self-pay | Admitting: General Surgery

## 2023-08-12 DIAGNOSIS — D241 Benign neoplasm of right breast: Secondary | ICD-10-CM

## 2023-08-17 ENCOUNTER — Encounter (HOSPITAL_BASED_OUTPATIENT_CLINIC_OR_DEPARTMENT_OTHER): Payer: Self-pay | Admitting: General Surgery

## 2023-08-17 ENCOUNTER — Other Ambulatory Visit: Payer: Self-pay

## 2023-08-19 ENCOUNTER — Ambulatory Visit

## 2023-08-19 ENCOUNTER — Other Ambulatory Visit: Payer: Self-pay

## 2023-08-19 VITALS — BP 111/78 | HR 86 | Wt 171.0 lb

## 2023-08-19 DIAGNOSIS — Z3042 Encounter for surveillance of injectable contraceptive: Secondary | ICD-10-CM

## 2023-08-19 MED ORDER — MEDROXYPROGESTERONE ACETATE 150 MG/ML IM SUSY
150.0000 mg | PREFILLED_SYRINGE | Freq: Once | INTRAMUSCULAR | Status: AC
  Administered 2023-08-19: 150 mg via INTRAMUSCULAR

## 2023-08-19 NOTE — Progress Notes (Signed)
 Heather Malone here for Depo-Provera  Injection. Injection administered without complication. Patient will return in 3 months for next injection between 11/04/23 and 11/18/23. Next annual visit due July 2026.   Waddell LITTIE Burows, RN 08/19/2023  2:11 PM

## 2023-08-21 ENCOUNTER — Ambulatory Visit
Admission: RE | Admit: 2023-08-21 | Discharge: 2023-08-21 | Disposition: A | Discharge status: Home / Self Care | Source: Ambulatory Visit | Attending: General Surgery | Admitting: General Surgery

## 2023-08-21 ENCOUNTER — Other Ambulatory Visit: Payer: Self-pay | Admitting: General Surgery

## 2023-08-21 DIAGNOSIS — D241 Benign neoplasm of right breast: Secondary | ICD-10-CM

## 2023-08-21 HISTORY — PX: BREAST BIOPSY: SHX20

## 2023-08-21 MED ORDER — CHLORHEXIDINE GLUCONATE CLOTH 2 % EX PADS: 6.0000 | MEDICATED_PAD | Freq: Once | CUTANEOUS | Status: DC

## 2023-08-21 NOTE — Progress Notes (Signed)

## 2023-08-24 ENCOUNTER — Ambulatory Visit (HOSPITAL_BASED_OUTPATIENT_CLINIC_OR_DEPARTMENT_OTHER): Admitting: Anesthesiology

## 2023-08-24 ENCOUNTER — Other Ambulatory Visit: Payer: Self-pay

## 2023-08-24 ENCOUNTER — Ambulatory Visit
Admission: RE | Admit: 2023-08-24 | Discharge: 2023-08-24 | Disposition: A | Discharge status: Home / Self Care | Source: Ambulatory Visit | Attending: General Surgery | Admitting: General Surgery

## 2023-08-24 ENCOUNTER — Encounter (HOSPITAL_BASED_OUTPATIENT_CLINIC_OR_DEPARTMENT_OTHER): Payer: Self-pay | Admitting: General Surgery

## 2023-08-24 ENCOUNTER — Ambulatory Visit (HOSPITAL_BASED_OUTPATIENT_CLINIC_OR_DEPARTMENT_OTHER)
Admission: RE | Admit: 2023-08-24 | Discharge: 2023-08-24 | Disposition: A | Discharge status: Home / Self Care | Attending: General Surgery | Admitting: General Surgery

## 2023-08-24 ENCOUNTER — Encounter (HOSPITAL_BASED_OUTPATIENT_CLINIC_OR_DEPARTMENT_OTHER)
Admission: RE | Disposition: A | Discharge status: Home / Self Care | Payer: Self-pay | Source: Home / Self Care | Attending: General Surgery

## 2023-08-24 DIAGNOSIS — Z87891 Personal history of nicotine dependence: Secondary | ICD-10-CM | POA: Insufficient documentation

## 2023-08-24 DIAGNOSIS — D241 Benign neoplasm of right breast: Secondary | ICD-10-CM | POA: Insufficient documentation

## 2023-08-24 DIAGNOSIS — N6011 Diffuse cystic mastopathy of right breast: Secondary | ICD-10-CM | POA: Diagnosis not present

## 2023-08-24 DIAGNOSIS — N6021 Fibroadenosis of right breast: Secondary | ICD-10-CM | POA: Insufficient documentation

## 2023-08-24 DIAGNOSIS — Z01818 Encounter for other preprocedural examination: Secondary | ICD-10-CM

## 2023-08-24 DIAGNOSIS — N6315 Unspecified lump in the right breast, overlapping quadrants: Secondary | ICD-10-CM | POA: Diagnosis not present

## 2023-08-24 LAB — POCT PREGNANCY, URINE: Preg Test, Ur: NEGATIVE

## 2023-08-24 SURGERY — RADIOACTIVE SEED GUIDED BREAST BIOPSY
Anesthesia: General | Site: Breast | Laterality: Right

## 2023-08-24 MED ORDER — LIDOCAINE 2% (20 MG/ML) 5 ML SYRINGE
INTRAMUSCULAR | Status: AC
  Filled 2023-08-24: qty 15

## 2023-08-24 MED ORDER — ONDANSETRON HCL 4 MG/2ML IJ SOLN
INTRAMUSCULAR | Status: DC | PRN
  Administered 2023-08-24: 4 mg via INTRAVENOUS

## 2023-08-24 MED ORDER — ONDANSETRON HCL 4 MG/2ML IJ SOLN
INTRAMUSCULAR | Status: AC
  Filled 2023-08-24: qty 6

## 2023-08-24 MED ORDER — GABAPENTIN 100 MG PO CAPS
100.0000 mg | ORAL_CAPSULE | ORAL | Status: AC
  Administered 2023-08-24: 100 mg via ORAL

## 2023-08-24 MED ORDER — FENTANYL CITRATE (PF) 100 MCG/2ML IJ SOLN
INTRAMUSCULAR | Status: AC
Start: 2023-08-24 — End: 2023-08-24
  Filled 2023-08-24: qty 2

## 2023-08-24 MED ORDER — DEXMEDETOMIDINE HCL IN NACL 80 MCG/20ML IV SOLN
INTRAVENOUS | Status: AC
  Filled 2023-08-24: qty 40

## 2023-08-24 MED ORDER — OXYCODONE HCL 5 MG PO TABS
ORAL_TABLET | ORAL | Status: AC
  Filled 2023-08-24: qty 1

## 2023-08-24 MED ORDER — FENTANYL CITRATE (PF) 100 MCG/2ML IJ SOLN
25.0000 ug | INTRAMUSCULAR | Status: DC | PRN
  Administered 2023-08-24 (×2): 50 ug via INTRAVENOUS

## 2023-08-24 MED ORDER — MIDAZOLAM HCL 2 MG/2ML IJ SOLN
INTRAMUSCULAR | Status: AC
  Filled 2023-08-24: qty 2

## 2023-08-24 MED ORDER — MIDAZOLAM HCL 2 MG/2ML IJ SOLN
INTRAMUSCULAR | Status: DC | PRN
  Administered 2023-08-24: 2 mg via INTRAVENOUS

## 2023-08-24 MED ORDER — OXYCODONE HCL 5 MG PO TABS
5.0000 mg | ORAL_TABLET | Freq: Once | ORAL | Status: AC
  Administered 2023-08-24: 5 mg via ORAL

## 2023-08-24 MED ORDER — EPHEDRINE 5 MG/ML INJ
INTRAVENOUS | Status: AC
  Filled 2023-08-24: qty 5

## 2023-08-24 MED ORDER — ACETAMINOPHEN 500 MG PO TABS
ORAL_TABLET | ORAL | Status: AC
  Filled 2023-08-24: qty 2

## 2023-08-24 MED ORDER — 0.9 % SODIUM CHLORIDE (POUR BTL) OPTIME
TOPICAL | Status: DC | PRN
  Administered 2023-08-24: 1000 mL

## 2023-08-24 MED ORDER — AMISULPRIDE (ANTIEMETIC) 5 MG/2ML IV SOLN: 10.0000 mg | Freq: Once | INTRAVENOUS | Status: DC | PRN

## 2023-08-24 MED ORDER — BUPIVACAINE HCL (PF) 0.25 % IJ SOLN
INTRAMUSCULAR | Status: DC | PRN
  Administered 2023-08-24: 20 mL

## 2023-08-24 MED ORDER — PROPOFOL 10 MG/ML IV BOLUS
INTRAVENOUS | Status: DC | PRN
  Administered 2023-08-24: 170 mg via INTRAVENOUS
  Administered 2023-08-24: 30 mg via INTRAVENOUS

## 2023-08-24 MED ORDER — PHENYLEPHRINE 80 MCG/ML (10ML) SYRINGE FOR IV PUSH (FOR BLOOD PRESSURE SUPPORT)
PREFILLED_SYRINGE | INTRAVENOUS | Status: AC
  Filled 2023-08-24: qty 10

## 2023-08-24 MED ORDER — LACTATED RINGERS IV SOLN: INTRAVENOUS | Status: DC

## 2023-08-24 MED ORDER — LIDOCAINE HCL (CARDIAC) PF 100 MG/5ML IV SOSY
PREFILLED_SYRINGE | INTRAVENOUS | Status: DC | PRN
  Administered 2023-08-24: 80 mg via INTRAVENOUS

## 2023-08-24 MED ORDER — FENTANYL CITRATE (PF) 100 MCG/2ML IJ SOLN
INTRAMUSCULAR | Status: DC | PRN
  Administered 2023-08-24: 50 ug via INTRAVENOUS
  Administered 2023-08-24 (×2): 25 ug via INTRAVENOUS

## 2023-08-24 MED ORDER — METOCLOPRAMIDE HCL 5 MG/ML IJ SOLN
INTRAMUSCULAR | Status: AC
Start: 2023-08-24 — End: 2023-08-24
  Filled 2023-08-24: qty 2

## 2023-08-24 MED ORDER — GABAPENTIN 100 MG PO CAPS
ORAL_CAPSULE | ORAL | Status: AC
  Filled 2023-08-24: qty 1

## 2023-08-24 MED ORDER — PROPOFOL 500 MG/50ML IV EMUL
INTRAVENOUS | Status: AC
  Filled 2023-08-24: qty 50

## 2023-08-24 MED ORDER — ACETAMINOPHEN 500 MG PO TABS: 1000.0000 mg | ORAL_TABLET | ORAL | Status: DC

## 2023-08-24 MED ORDER — DEXAMETHASONE SODIUM PHOSPHATE 10 MG/ML IJ SOLN
INTRAMUSCULAR | Status: AC
Start: 2023-08-24 — End: 2023-08-24
  Filled 2023-08-24: qty 3

## 2023-08-24 MED ORDER — DEXAMETHASONE SODIUM PHOSPHATE 4 MG/ML IJ SOLN
INTRAMUSCULAR | Status: DC | PRN
  Administered 2023-08-24: 8 mg via INTRAVENOUS

## 2023-08-24 MED ORDER — ONDANSETRON HCL 4 MG/2ML IJ SOLN: 4.0000 mg | Freq: Once | INTRAMUSCULAR | Status: DC | PRN

## 2023-08-24 MED ORDER — OXYCODONE HCL 5 MG PO TABS: 5.0000 mg | ORAL_TABLET | Freq: Four times a day (QID) | ORAL | 0 refills | Status: DC | PRN

## 2023-08-24 MED ORDER — CEFAZOLIN SODIUM-DEXTROSE 2-4 GM/100ML-% IV SOLN
2.0000 g | INTRAVENOUS | Status: AC
  Administered 2023-08-24: 2 g via INTRAVENOUS

## 2023-08-24 MED ORDER — ROCURONIUM BROMIDE 10 MG/ML (PF) SYRINGE
PREFILLED_SYRINGE | INTRAVENOUS | Status: AC
  Filled 2023-08-24: qty 10

## 2023-08-24 MED ORDER — CEFAZOLIN SODIUM-DEXTROSE 2-4 GM/100ML-% IV SOLN
INTRAVENOUS | Status: AC
  Filled 2023-08-24: qty 100

## 2023-08-24 SURGICAL SUPPLY — 32 items
BLADE SURG 15 STRL LF DISP TIS (BLADE) ×1 IMPLANT
CANISTER SUC SOCK COL 7IN (MISCELLANEOUS) ×1 IMPLANT
CANISTER SUCT 1200ML W/VALVE (MISCELLANEOUS) ×1 IMPLANT
CHLORAPREP W/TINT 26 (MISCELLANEOUS) ×1 IMPLANT
CLIP APPLIE 9.375 MED OPEN (MISCELLANEOUS) IMPLANT
COVER BACK TABLE 60X90IN (DRAPES) ×1 IMPLANT
COVER MAYO STAND STRL (DRAPES) ×1 IMPLANT
COVER PROBE CYLINDRICAL 5X96 (MISCELLANEOUS) ×1 IMPLANT
DERMABOND ADVANCED .7 DNX12 (GAUZE/BANDAGES/DRESSINGS) ×1 IMPLANT
DRAPE LAPAROSCOPIC ABDOMINAL (DRAPES) ×1 IMPLANT
DRAPE UTILITY XL STRL (DRAPES) ×1 IMPLANT
ELECT COATED BLADE 2.86 ST (ELECTRODE) ×1 IMPLANT
ELECTRODE REM PT RTRN 9FT ADLT (ELECTROSURGICAL) ×1 IMPLANT
GLOVE BIO SURGEON STRL SZ7.5 (GLOVE) ×2 IMPLANT
GOWN STRL REUS W/ TWL LRG LVL3 (GOWN DISPOSABLE) ×2 IMPLANT
KIT MARKER MARGIN INK (KITS) ×1 IMPLANT
NDL HYPO 25X1 1.5 SAFETY (NEEDLE) IMPLANT
NEEDLE HYPO 25X1 1.5 SAFETY (NEEDLE) IMPLANT
NS IRRIG 1000ML POUR BTL (IV SOLUTION) IMPLANT
PACK BASIN DAY SURGERY FS (CUSTOM PROCEDURE TRAY) ×1 IMPLANT
PENCIL SMOKE EVACUATOR (MISCELLANEOUS) ×1 IMPLANT
SLEEVE SCD COMPRESS KNEE MED (STOCKING) ×1 IMPLANT
SPIKE FLUID TRANSFER (MISCELLANEOUS) IMPLANT
SPONGE T-LAP 18X18 ~~LOC~~+RFID (SPONGE) ×1 IMPLANT
SUT MON AB 4-0 PC3 18 (SUTURE) ×1 IMPLANT
SUT SILK 2 0 SH (SUTURE) IMPLANT
SUT VICRYL 3-0 CR8 SH (SUTURE) ×1 IMPLANT
SYR CONTROL 10ML LL (SYRINGE) IMPLANT
TOWEL GREEN STERILE FF (TOWEL DISPOSABLE) ×1 IMPLANT
TRAY FAXITRON CT DISP (TRAY / TRAY PROCEDURE) ×1 IMPLANT
TUBE CONNECTING 20X1/4 (TUBING) ×1 IMPLANT
YANKAUER SUCT BULB TIP NO VENT (SUCTIONS) IMPLANT

## 2023-08-24 NOTE — Discharge Instructions (Addendum)
  Post Anesthesia Home Care Instructions  Activity: Get plenty of rest for the remainder of the day. A responsible individual must stay with you for 24 hours following the procedure.  For the next 24 hours, DO NOT: -Drive a car -Advertising copywriter -Drink alcoholic beverages -Take any medication unless instructed by your physician -Make any legal decisions or sign important papers.  Meals: Start with liquid foods such as gelatin or soup. Progress to regular foods as tolerated. Avoid greasy, spicy, heavy foods. If nausea and/or vomiting occur, drink only clear liquids until the nausea and/or vomiting subsides. Call your physician if vomiting continues.  Special Instructions/Symptoms: Your throat may feel dry or sore from the anesthesia or the breathing tube placed in your throat during surgery. If this causes discomfort, gargle with warm salt water. The discomfort should disappear within 24 hours.  If you had a scopolamine patch placed behind your ear for the management of post- operative nausea and/or vomiting:  1. The medication in the patch is effective for 72 hours, after which it should be removed.  Wrap patch in a tissue and discard in the trash. Wash hands thoroughly with soap and water. 2. You may remove the patch earlier than 72 hours if you experience unpleasant side effects which may include dry mouth, dizziness or visual disturbances. 3. Avoid touching the patch. Wash your hands with soap and water after contact with the patch.      Next dose of Oxycodone  may be taken at 7p

## 2023-08-24 NOTE — Interval H&P Note (Signed)
 History and Physical Interval Note:  08/24/2023 11:23 AM  Heather Malone  has presented today for surgery, with the diagnosis of RIGHT BREAST ADENOMYOEPITHELIOMA.  The various methods of treatment have been discussed with the patient and family. After consideration of risks, benefits and other options for treatment, the patient has consented to  Procedure(s) with comments: RADIOACTIVE SEED GUIDED BREAST BIOPSY (Right) - RIGHT BREAST RADIOACTIVE SEED LOCALIZED LUMPECTOMY as a surgical intervention.  The patient's history has been reviewed, patient examined, no change in status, stable for surgery.  I have reviewed the patient's chart and labs.  Questions were answered to the patient's satisfaction.     Deward Null III

## 2023-08-24 NOTE — Transfer of Care (Signed)
 Immediate Anesthesia Transfer of Care Note  Patient: Heather Malone  Procedure(s) Performed: RADIOACTIVE SEED GUIDED BREAST BIOPSY (Right: Breast)  Patient Location: PACU  Anesthesia Type:General  Level of Consciousness: Drowsy  Airway & Oxygen Therapy: Patient Spontanous Breathing and Patient connected to nasal cannula oxygen  Post-op Assessment: Report given to RN and Post -op Vital signs reviewed and stable  Post vital signs: Reviewed and stable  Last Vitals:  Vitals Value Taken Time  BP    Temp    Pulse 90 08/24/23 12:26  Resp    SpO2 99 % 08/24/23 12:26  Vitals shown include unfiled device data.  Last Pain:  Vitals:   08/24/23 0955  PainSc: 0-No pain      Patients Stated Pain Goal: 4 (08/24/23 0955)  Complications: No notable events documented.

## 2023-08-24 NOTE — Anesthesia Preprocedure Evaluation (Signed)
 Anesthesia Evaluation  Patient identified by MRN, date of birth, ID band Patient awake    Reviewed: Allergy & Precautions, NPO status , Patient's Chart, lab work & pertinent test results  History of Anesthesia Complications (+) Family history of anesthesia reaction  Airway Mallampati: II  TM Distance: >3 FB Neck ROM: Full    Dental  (+) Teeth Intact, Dental Advisory Given   Pulmonary neg pulmonary ROS, former smoker   Pulmonary exam normal breath sounds clear to auscultation       Cardiovascular negative cardio ROS Normal cardiovascular exam Rhythm:Regular Rate:Normal     Neuro/Psych  Headaches PSYCHIATRIC DISORDERS Anxiety        GI/Hepatic negative GI ROS, Neg liver ROS,,,  Endo/Other  Obesity   Renal/GU negative Renal ROS     Musculoskeletal negative musculoskeletal ROS (+)    Abdominal   Peds  Hematology negative hematology ROS (+)   Anesthesia Other Findings Day of surgery medications reviewed with the patient.  RIGHT BREAST ADENOMYOEPITHELIOMA  Reproductive/Obstetrics                              Anesthesia Physical Anesthesia Plan  ASA: 2  Anesthesia Plan: General   Post-op Pain Management: Tylenol  PO (pre-op)* and Toradol  IV (intra-op)*   Induction: Intravenous  PONV Risk Score and Plan: 3 and Midazolam , Dexamethasone  and Ondansetron   Airway Management Planned: LMA  Additional Equipment:   Intra-op Plan:   Post-operative Plan: Extubation in OR  Informed Consent: I have reviewed the patients History and Physical, chart, labs and discussed the procedure including the risks, benefits and alternatives for the proposed anesthesia with the patient or authorized representative who has indicated his/her understanding and acceptance.     Dental advisory given  Plan Discussed with: CRNA  Anesthesia Plan Comments:         Anesthesia Quick Evaluation

## 2023-08-24 NOTE — Anesthesia Procedure Notes (Signed)
 Procedure Name: LMA Insertion Date/Time: 08/24/2023 11:39 AM  Performed by: Donnell Berwyn SQUIBB, CRNAPre-anesthesia Checklist: Patient identified, Patient being monitored, Emergency Drugs available, Timeout performed and Suction available Patient Re-evaluated:Patient Re-evaluated prior to induction Oxygen Delivery Method: Circle System Utilized Preoxygenation: Pre-oxygenation with 100% oxygen Induction Type: IV induction Ventilation: Mask ventilation without difficulty LMA: LMA inserted LMA Size: 4.0 Number of attempts: 1 Placement Confirmation: positive ETCO2 and breath sounds checked- equal and bilateral

## 2023-08-24 NOTE — H&P (Signed)
 REFERRING PHYSICIAN: Vannie Cornell SAUNDERS, CNM PROVIDER: DEWARD GARNETTE NULL, MD MRN: [id number] DOB: October 05, 1990 Subjective   Chief Complaint: new patient (Rt breast fibrotic fibroadenoma with extensive adenosis )  History of Present Illness: Heather Malone is a 33 y.o. female who is seen today as an office consultation for evaluation of new patient (Rt breast fibrotic fibroadenoma with extensive adenosis )  We are asked to see the patient in consultation by Dr. Tyronne Vannie to evaluate her for a right breast mass. The patient is a 33 year old black female who first noticed a small mass on the underside of the right breast during her pregnancy. She gave birth about 12 weeks ago. She is no longer breast-feeding. Since that time the mass has gotten smaller but she still can feel it a little bit. She denies any other breast pain. She has had no discharge from the nipple. She has no family history of breast cancer. She does have some underlying asthma and smokes. Her mammogram and ultrasound showed a 1 cm mass in the underside of the right breast. The lymph nodes looked normal. The mass was biopsied same back as an adenoma myoepithelioma.  Review of Systems: A complete review of systems was obtained from the patient. I have reviewed this information and discussed as appropriate with the patient. See HPI as well for other ROS.  ROS   Medical History: Past Medical History:  Diagnosis Date  Anemia  Anxiety  Asthma, unspecified asthma severity, unspecified whether complicated, unspecified whether persistent (HHS-HCC)  COVID-19 06/2020  GERD (gastroesophageal reflux disease)   Patient Active Problem List  Diagnosis  Adenomyoepithelioma of right breast   Past Surgical History:  Procedure Laterality Date  TONSILLECTOMY    No Known Allergies  Current Outpatient Medications on File Prior to Visit  Medication Sig Dispense Refill  omeprazole  (PRILOSEC) 20 MG DR capsule  PNV/ferrous  fum/docusate/folic (PRENATAL VIT-FE FUM-FA-DSS ORAL) Take by mouth   No current facility-administered medications on file prior to visit.   Family History  Problem Relation Age of Onset  High blood pressure (Hypertension) Mother  Coronary Artery Disease (Blocked arteries around heart) Mother  Diabetes Maternal Grandmother  High blood pressure (Hypertension) Maternal Grandmother  Diabetes Maternal Grandfather  High blood pressure (Hypertension) Maternal Grandfather  Diabetes Paternal Grandmother  High blood pressure (Hypertension) Paternal Grandmother  Diabetes Paternal Grandfather  High blood pressure (Hypertension) Paternal Grandfather    Social History   Tobacco Use  Smoking Status Former  Smokeless Tobacco Former    Social History   Socioeconomic History  Marital status: Single  Tobacco Use  Smoking status: Former  Smokeless tobacco: Former  Substance and Sexual Activity  Drug use: Never   Social Drivers of Architectural technologist Insecurity: No Food Insecurity (07/29/2023)  Received from [facility]  Hunger Vital Sign  Within the past 12 months, you worried that your food would run out before you got the money to buy more.: Never true  Within the past 12 months, the food you bought just didn't last and you didn't have money to get more.: Never true  Transportation Needs: No Transportation Needs (07/29/2023)  Received from [facility] - Transportation  In the past 12 months, has lack of transportation kept you from medical appointments or from getting medications?: No  In the past 12 months, has lack of transportation kept you from meetings, work, or from getting things needed for daily living?: No   Objective:   Vitals:  BP: 116/78  Pulse:  92  Temp: 36.7 C (98 F)  SpO2: 95%  Weight: 78.3 kg (172 lb 9.6 oz)  Height: 157.5 cm (5' 2)  PainSc: 0-No pain   Body mass index is 31.57 kg/m.  Physical Exam Vitals reviewed.  Constitutional:  General: She is not  in acute distress. Appearance: Normal appearance.  HENT:  Head: Normocephalic and atraumatic.  Right Ear: External ear normal.  Left Ear: External ear normal.  Nose: Nose normal.  Mouth/Throat:  Mouth: Mucous membranes are moist.  Pharynx: Oropharynx is clear.   Eyes:  General: No scleral icterus. Extraocular Movements: Extraocular movements intact.  Conjunctiva/sclera: Conjunctivae normal.  Pupils: Pupils are equal, round, and reactive to light.   Cardiovascular:  Rate and Rhythm: Normal rate and regular rhythm.  Pulses: Normal pulses.  Heart sounds: Normal heart sounds.  Pulmonary:  Effort: Pulmonary effort is normal. No respiratory distress.  Breath sounds: Normal breath sounds.  Abdominal:  General: Bowel sounds are normal.  Palpations: Abdomen is soft.  Tenderness: There is no abdominal tenderness.   Musculoskeletal:  General: No swelling, tenderness or deformity. Normal range of motion.  Cervical back: Normal range of motion and neck supple.   Skin: General: Skin is warm and dry.  Coloration: Skin is not jaundiced.   Neurological:  General: No focal deficit present.  Mental Status: She is alert and oriented to person, place, and time.   Psychiatric:  Mood and Affect: Mood normal.  Behavior: Behavior normal.     Breast: There is no palpable mass in either breast. I find it difficult to feel the same thing she is feeling. There is no palpable axillary, supraclavicular, or cervical lymphadenopathy.  Labs, Imaging and Diagnostic Testing:  Assessment and Plan:   Diagnoses and all orders for this visit:  Adenomyoepithelioma of right breast - [facility] Case Posting Request; Future   The patient appears to have an adenoma myoepithelioma in the lower portion of the right breast that measures about 1 cm. Although these lesions are benign they can have some malignant potential and a tendency to recur. Because of this the recommendation is to have this area completely  removed. She would also like to have this done. I have discussed with her in detail the risks and benefits of the operation as well as some of the technical aspects including the use of a radioactive seed for localization and she understands and wishes to proceed. We will move forward with surgical scheduling.

## 2023-08-24 NOTE — Anesthesia Postprocedure Evaluation (Signed)
 Anesthesia Post Note  Patient: Heather Malone  Procedure(s) Performed: RADIOACTIVE SEED GUIDED BREAST BIOPSY (Right: Breast)     Patient location during evaluation: PACU Anesthesia Type: General Level of consciousness: awake and alert Pain management: pain level controlled Vital Signs Assessment: post-procedure vital signs reviewed and stable Respiratory status: spontaneous breathing, nonlabored ventilation and respiratory function stable Cardiovascular status: blood pressure returned to baseline and stable Postop Assessment: no apparent nausea or vomiting Anesthetic complications: no   No notable events documented.  Last Vitals:  Vitals:   08/24/23 1315 08/24/23 1326  BP: 113/87 126/81  Pulse: (!) 52 72  Resp: 12 16  Temp:  (!) 36.2 C  SpO2: 97% 98%    Last Pain:  Vitals:   08/24/23 1326  TempSrc: Temporal  PainSc: 4                  Garnette FORBES Skillern

## 2023-08-24 NOTE — Op Note (Signed)
 08/24/2023  12:17 PM  PATIENT:  Heather Malone  33 y.o. female  PRE-OPERATIVE DIAGNOSIS:  RIGHT BREAST ADENOMYOEPITHELIOMA  POST-OPERATIVE DIAGNOSIS:  RIGHT BREAST ADENOMYOEPITHELIOMA  PROCEDURE:  Procedure(s) with comments: RIGHT BREAST RADIOACTIVE SEED LOCALIZED LUMPECTOMY  SURGEON:  Surgeons and Role:    * Curvin Deward MOULD, MD - Primary  PHYSICIAN ASSISTANT:   ASSISTANTS: none   ANESTHESIA:   local and general  EBL:  minimal   BLOOD ADMINISTERED:none  DRAINS: none   LOCAL MEDICATIONS USED:  MARCAINE      SPECIMEN:  Source of Specimen:  right breast tissue with additional deep and superior margins  DISPOSITION OF SPECIMEN:  PATHOLOGY  COUNTS:  YES  TOURNIQUET:  * No tourniquets in log *  DICTATION: .Dragon Dictation  After informed consent was obtained the patient was brought to the operating room and placed in the supine position on the operating table.  After adequate duction of general anesthesia the patient's right breast was prepped with ChloraPrep, allowed to dry, and draped in usual sterile manner.  An appropriate timeout was performed.  Previously an I-125 seed was placed in the lower aspect of the right breast to mark an area of adenomyoepithelioma.  The neoprobe was set to I-125 in the area of radioactivity was readily identified.  The area around this was infiltrated with quarter percent Marcaine .  A curvilinear incision was made along the inframammary fold closest to the area in question.  The incision was carried through the skin and subcutaneous tissue sharply with the electrocautery.  Dissection was then carried down to the chest wall.  Dissection was then carried superiorly along the deep plane between the breast tissue and the chest wall.  Dissection was then carried anteriorly between the breast tissue and the subcutaneous fat and skin.  Once the dissection was beyond the area in question I was then able to remove a circular portion of breast tissue sharply  with the electrocautery around the radioactive seed while checking the area of radioactivity frequently.  Once the tissue was removed it was oriented with the appropriate paint colors.  A specimen radiograph was obtained that showed the clip and seed to be within the specimen.  I elected to take an additional deep and superior margin.  These were marked appropriately.  All of the tissue was then sent to pathology for further evaluation.  At this point the anterior margin is the skin, the posterior margin is the chest wall.  In the inferior margin is at the inferior most portion of the breast tissue.  Hemostasis was achieved using the Bovie electrocautery.  The wound was irrigated with saline and infiltrated with quarter percent Marcaine .  A deep layer of the incision was then closed with interrupted 3-0 Vicryl stitches.  The skin was closed with a running 4-0 Monocryl subcuticular stitch.  Dermabond dressings were applied.  The patient tolerated the procedure well.  At the end of the case all needle sponge and instrument counts were correct.  The patient was then awakened and taken recovery in stable condition.  PLAN OF CARE: Discharge to home after PACU  PATIENT DISPOSITION:  PACU - hemodynamically stable.   Delay start of Pharmacological VTE agent (>24hrs) due to surgical blood loss or risk of bleeding: not applicable

## 2023-08-25 LAB — SURGICAL PATHOLOGY

## 2023-08-28 ENCOUNTER — Ambulatory Visit: Payer: Self-pay | Admitting: General Surgery

## 2023-10-06 ENCOUNTER — Encounter (HOSPITAL_COMMUNITY): Payer: Self-pay | Admitting: Obstetrics and Gynecology

## 2023-10-06 NOTE — Progress Notes (Signed)
 Spoke w/ via phone for pre-op interview--- [facility] needs dos----T&S and UPT per surgeon.         Lab results------ COVID test -----patient states asymptomatic no test needed Arrive at -------1230 NPO after MN NO Solid Food.  Clear liquids from MN until---1130 Pre-Surgery Ensure or G2:  Med rec completed Medications to take morning of surgery -----Bring Albuterol  inhaler Diabetic medication -----  GLP1 agonist last dose: GLP1 instructions:  Patient instructed no nail polish to be worn day of surgery Patient instructed to bring photo id and insurance card day of surgery Patient aware to have Driver (ride ) / caregiver    for 24 hours after surgery - Mother Shalonda Sachse Patient Special Instructions ----- Pre-Op special Instructions -----  Patient verbalized understanding of instructions that were given at this phone interview. Patient denies chest pain, sob, fever, cough at the interview.

## 2023-10-13 ENCOUNTER — Ambulatory Visit (HOSPITAL_COMMUNITY)
Admission: RE | Admit: 2023-10-13 | Discharge: 2023-10-13 | Disposition: A | Discharge status: Home / Self Care | Attending: Obstetrics and Gynecology | Admitting: Obstetrics and Gynecology

## 2023-10-13 ENCOUNTER — Ambulatory Visit (HOSPITAL_BASED_OUTPATIENT_CLINIC_OR_DEPARTMENT_OTHER)

## 2023-10-13 ENCOUNTER — Encounter (HOSPITAL_COMMUNITY): Payer: Self-pay | Admitting: Obstetrics and Gynecology

## 2023-10-13 ENCOUNTER — Other Ambulatory Visit: Payer: Self-pay

## 2023-10-13 ENCOUNTER — Encounter (HOSPITAL_COMMUNITY)
Admission: RE | Disposition: A | Discharge status: Home / Self Care | Payer: Self-pay | Source: Home / Self Care | Attending: Obstetrics and Gynecology

## 2023-10-13 ENCOUNTER — Ambulatory Visit (HOSPITAL_COMMUNITY)

## 2023-10-13 DIAGNOSIS — Z87891 Personal history of nicotine dependence: Secondary | ICD-10-CM | POA: Insufficient documentation

## 2023-10-13 DIAGNOSIS — J45909 Unspecified asthma, uncomplicated: Secondary | ICD-10-CM | POA: Insufficient documentation

## 2023-10-13 DIAGNOSIS — Z302 Encounter for sterilization: Secondary | ICD-10-CM

## 2023-10-13 DIAGNOSIS — I10 Essential (primary) hypertension: Secondary | ICD-10-CM | POA: Insufficient documentation

## 2023-10-13 DIAGNOSIS — Z3009 Encounter for other general counseling and advice on contraception: Secondary | ICD-10-CM | POA: Diagnosis present

## 2023-10-13 HISTORY — PX: LAPAROSCOPIC BILATERAL SALPINGECTOMY: SHX5889

## 2023-10-13 LAB — POCT PREGNANCY, URINE: Preg Test, Ur: NEGATIVE

## 2023-10-13 SURGERY — SALPINGECTOMY, BILATERAL, LAPAROSCOPIC
Anesthesia: General | Site: Pelvis | Laterality: Bilateral

## 2023-10-13 MED ORDER — ONDANSETRON HCL 4 MG/2ML IJ SOLN
INTRAMUSCULAR | Status: AC
  Filled 2023-10-13: qty 2

## 2023-10-13 MED ORDER — LIDOCAINE 2% (20 MG/ML) 5 ML SYRINGE
INTRAMUSCULAR | Status: DC | PRN
  Administered 2023-10-13: 100 mg via INTRAVENOUS

## 2023-10-13 MED ORDER — DEXMEDETOMIDINE HCL IN NACL 80 MCG/20ML IV SOLN
INTRAVENOUS | Status: DC | PRN
Start: 2023-10-13 — End: 2023-10-13
  Administered 2023-10-13: 10 ug via INTRAVENOUS

## 2023-10-13 MED ORDER — OXYCODONE HCL 5 MG/5ML PO SOLN: 5.0000 mg | Freq: Once | ORAL | Status: AC | PRN

## 2023-10-13 MED ORDER — CHLORHEXIDINE GLUCONATE 0.12 % MT SOLN
OROMUCOSAL | Status: AC
  Filled 2023-10-13: qty 15

## 2023-10-13 MED ORDER — KETOROLAC TROMETHAMINE 30 MG/ML IJ SOLN
INTRAMUSCULAR | Status: DC | PRN
  Administered 2023-10-13: 30 mg via INTRAVENOUS

## 2023-10-13 MED ORDER — LACTATED RINGERS IV SOLN: INTRAVENOUS | Status: DC

## 2023-10-13 MED ORDER — PHENYLEPHRINE 80 MCG/ML (10ML) SYRINGE FOR IV PUSH (FOR BLOOD PRESSURE SUPPORT)
PREFILLED_SYRINGE | INTRAVENOUS | Status: DC | PRN
  Administered 2023-10-13: 80 ug via INTRAVENOUS

## 2023-10-13 MED ORDER — KETOROLAC TROMETHAMINE 30 MG/ML IJ SOLN: 30.0000 mg | Freq: Once | INTRAMUSCULAR | Status: DC | PRN

## 2023-10-13 MED ORDER — FENTANYL CITRATE (PF) 100 MCG/2ML IJ SOLN
INTRAMUSCULAR | Status: AC
  Filled 2023-10-13: qty 2

## 2023-10-13 MED ORDER — ROCURONIUM BROMIDE 10 MG/ML (PF) SYRINGE
PREFILLED_SYRINGE | INTRAVENOUS | Status: DC | PRN
  Administered 2023-10-13: 40 mg via INTRAVENOUS

## 2023-10-13 MED ORDER — DEXAMETHASONE SODIUM PHOSPHATE 10 MG/ML IJ SOLN
INTRAMUSCULAR | Status: AC
  Filled 2023-10-13: qty 1

## 2023-10-13 MED ORDER — FENTANYL CITRATE (PF) 250 MCG/5ML IJ SOLN
INTRAMUSCULAR | Status: DC | PRN
  Administered 2023-10-13: 100 ug via INTRAVENOUS
  Administered 2023-10-13: 50 ug via INTRAVENOUS

## 2023-10-13 MED ORDER — CHLORHEXIDINE GLUCONATE 0.12 % MT SOLN
15.0000 mL | Freq: Once | OROMUCOSAL | Status: AC
  Administered 2023-10-13: 15 mL via OROMUCOSAL

## 2023-10-13 MED ORDER — MIDAZOLAM HCL 2 MG/2ML IJ SOLN
INTRAMUSCULAR | Status: AC
  Filled 2023-10-13: qty 2

## 2023-10-13 MED ORDER — FENTANYL CITRATE (PF) 250 MCG/5ML IJ SOLN
INTRAMUSCULAR | Status: AC
  Filled 2023-10-13: qty 5

## 2023-10-13 MED ORDER — FENTANYL CITRATE (PF) 100 MCG/2ML IJ SOLN
25.0000 ug | INTRAMUSCULAR | Status: DC | PRN
  Administered 2023-10-13 (×3): 50 ug via INTRAVENOUS

## 2023-10-13 MED ORDER — IBUPROFEN 800 MG PO TABS: 800.0000 mg | ORAL_TABLET | Freq: Three times a day (TID) | ORAL | 0 refills | Status: AC | PRN

## 2023-10-13 MED ORDER — PROPOFOL 10 MG/ML IV BOLUS
INTRAVENOUS | Status: AC
  Filled 2023-10-13: qty 20

## 2023-10-13 MED ORDER — 0.9 % SODIUM CHLORIDE (POUR BTL) OPTIME
TOPICAL | Status: DC | PRN
  Administered 2023-10-13: 1000 mL

## 2023-10-13 MED ORDER — BUPIVACAINE HCL (PF) 0.25 % IJ SOLN
INTRAMUSCULAR | Status: DC | PRN
  Administered 2023-10-13: 11 mL

## 2023-10-13 MED ORDER — AMISULPRIDE (ANTIEMETIC) 5 MG/2ML IV SOLN: 10.0000 mg | Freq: Once | INTRAVENOUS | Status: DC | PRN

## 2023-10-13 MED ORDER — ORAL CARE MOUTH RINSE: 15.0000 mL | Freq: Once | OROMUCOSAL | Status: AC

## 2023-10-13 MED ORDER — KETOROLAC TROMETHAMINE 30 MG/ML IJ SOLN
INTRAMUSCULAR | Status: AC
  Filled 2023-10-13: qty 1

## 2023-10-13 MED ORDER — SUGAMMADEX SODIUM 200 MG/2ML IV SOLN
INTRAVENOUS | Status: DC | PRN
  Administered 2023-10-13: 200 mg via INTRAVENOUS

## 2023-10-13 MED ORDER — ROCURONIUM BROMIDE 10 MG/ML (PF) SYRINGE
PREFILLED_SYRINGE | INTRAVENOUS | Status: AC
  Filled 2023-10-13: qty 10

## 2023-10-13 MED ORDER — OXYCODONE HCL 5 MG PO TABS: 5.0000 mg | ORAL_TABLET | ORAL | 0 refills | Status: DC | PRN

## 2023-10-13 MED ORDER — PROPOFOL 10 MG/ML IV BOLUS
INTRAVENOUS | Status: DC | PRN
  Administered 2023-10-13: 200 mg via INTRAVENOUS

## 2023-10-13 MED ORDER — LIDOCAINE 2% (20 MG/ML) 5 ML SYRINGE
INTRAMUSCULAR | Status: AC
  Filled 2023-10-13: qty 5

## 2023-10-13 MED ORDER — SCOPOLAMINE 1 MG/3DAYS TD PT72
1.0000 | MEDICATED_PATCH | TRANSDERMAL | Status: DC
  Administered 2023-10-13: 1 mg via TRANSDERMAL

## 2023-10-13 MED ORDER — POVIDONE-IODINE 10 % EX SWAB: 2.0000 | Freq: Once | CUTANEOUS | Status: DC

## 2023-10-13 MED ORDER — OXYCODONE HCL 5 MG PO TABS
5.0000 mg | ORAL_TABLET | Freq: Once | ORAL | Status: AC | PRN
  Administered 2023-10-13: 5 mg via ORAL

## 2023-10-13 MED ORDER — SCOPOLAMINE 1 MG/3DAYS TD PT72
MEDICATED_PATCH | TRANSDERMAL | Status: AC
  Filled 2023-10-13: qty 1

## 2023-10-13 MED ORDER — OXYCODONE HCL 5 MG PO TABS
ORAL_TABLET | ORAL | Status: AC
  Filled 2023-10-13: qty 1

## 2023-10-13 MED ORDER — PHENYLEPHRINE 80 MCG/ML (10ML) SYRINGE FOR IV PUSH (FOR BLOOD PRESSURE SUPPORT)
PREFILLED_SYRINGE | INTRAVENOUS | Status: AC
  Filled 2023-10-13: qty 10

## 2023-10-13 MED ORDER — MIDAZOLAM HCL 2 MG/2ML IJ SOLN
INTRAMUSCULAR | Status: DC | PRN
  Administered 2023-10-13: 2 mg via INTRAVENOUS

## 2023-10-13 MED ORDER — ONDANSETRON HCL 4 MG/2ML IJ SOLN
INTRAMUSCULAR | Status: DC | PRN
  Administered 2023-10-13: 4 mg via INTRAVENOUS

## 2023-10-13 MED ORDER — DEXAMETHASONE SODIUM PHOSPHATE 10 MG/ML IJ SOLN
INTRAMUSCULAR | Status: DC | PRN
  Administered 2023-10-13: 10 mg via INTRAVENOUS

## 2023-10-13 SURGICAL SUPPLY — 31 items
BAG COUNTER SPONGE SURGICOUNT (BAG) IMPLANT
COVER MAYO STAND STRL (DRAPES) ×1 IMPLANT
DERMABOND ADVANCED .7 DNX12 (GAUZE/BANDAGES/DRESSINGS) ×1 IMPLANT
DRAPE SURG IRRIG POUCH 19X23 (DRAPES) ×1 IMPLANT
DURAPREP 26ML APPLICATOR (WOUND CARE) ×1 IMPLANT
GLOVE BIO SURGEON STRL SZ 6 (GLOVE) ×1 IMPLANT
GLOVE BIOGEL PI IND STRL 7.0 (GLOVE) ×2 IMPLANT
GOWN STRL REUS W/ TWL LRG LVL3 (GOWN DISPOSABLE) ×2 IMPLANT
IRRIGATION SUCT STRKRFLW 2 WTP (MISCELLANEOUS) IMPLANT
KIT PINK PAD W/HEAD ARM REST (MISCELLANEOUS) ×1 IMPLANT
KIT TURNOVER KIT B (KITS) ×1 IMPLANT
LIGASURE VESSEL 5MM BLUNT TIP (ELECTROSURGICAL) IMPLANT
NDL INSUFFLATION 14GA 120MM (NEEDLE) ×1 IMPLANT
NEEDLE INSUFFLATION 14GA 120MM (NEEDLE) IMPLANT
PACK LAPAROSCOPY BASIN (CUSTOM PROCEDURE TRAY) ×1 IMPLANT
PAD POSITIONING PINK XL (MISCELLANEOUS) ×1 IMPLANT
POUCH LAPAROSCOPIC INSTRUMENT (MISCELLANEOUS) ×1 IMPLANT
SEALER TISSUE G2 CVD JAW 35 (ENDOMECHANICALS) IMPLANT
SET TUBE SMOKE EVAC HIGH FLOW (TUBING) ×1 IMPLANT
SHEARS HARMONIC 36 ACE (MISCELLANEOUS) IMPLANT
SLEEVE ADV FIXATION 5X100MM (TROCAR) ×1 IMPLANT
SUT VIC AB 4-0 PS2 18 (SUTURE) ×1 IMPLANT
SUT VICRYL 0 UR6 27IN ABS (SUTURE) ×1 IMPLANT
SYR 30ML LL (SYRINGE) IMPLANT
SYSTEM BAG RETRIEVAL 10MM (BASKET) IMPLANT
SYSTEM CARTER THOMASON II (TROCAR) IMPLANT
TOWEL GREEN STERILE FF (TOWEL DISPOSABLE) ×1 IMPLANT
TRAY FOLEY W/BAG SLVR 14FR (SET/KITS/TRAYS/PACK) ×1 IMPLANT
TROCAR 11X100 Z THREAD (TROCAR) ×1 IMPLANT
TROCAR ADV FIXATION 5X100MM (TROCAR) ×1 IMPLANT
WARMER LAPAROSCOPE (MISCELLANEOUS) ×1 IMPLANT

## 2023-10-13 NOTE — Op Note (Signed)
 Heather Malone Been PROCEDURE DATE: 10/13/2023   PREOPERATIVE DIAGNOSIS:  Undesired fertility  POSTOPERATIVE DIAGNOSIS:  Undesired fertility  PROCEDURE:  Laparoscopic Bilateral Salpingectomy   SURGEON:  Dr. Vina Solian  ASSISTANT:  Jorene Moats, PA-C. An experienced assistant was required given the standard of surgical care given the complexity of the case.  This assistant was needed for exposure, dissection, suctioning, retraction, instrument exchange, and for overall help during the procedure.  ANESTHESIA:  General endotracheal  COMPLICATIONS:  None immediate.  ESTIMATED BLOOD LOSS:  4 ml.  FLUIDS: 800 ml LR.  URINE OUTPUT:  Not drained - voided before the surgery  INDICATIONS: 33 y.o. [id number] with undesired fertility, desires permanent sterilization.  Other forms of contraception were discussed with patient and emphasized alternatives of vasectomy, IUDs and Nexplanon as they have equivalent contraceptive efficacy; she declines all other modalities.  Risks of procedure discussed with patient including permanence of method, risk of regret, bleeding, infection, injury to surrounding organs and need for additional procedures including laparotomy.  Failure risk less than 0.5% with increased risk of ectopic gestation if pregnancy occurs was also discussed with patient.  Written informed consent was obtained.    FINDINGS:  Normal uterus, fallopian tubes, and ovaries. Normal abdominal survey.   TECHNIQUE:  The patient was taken to the operating room where general anesthesia was obtained without difficulty.  She was then placed in the dorsal supine position and prepared and draped in sterile fashion.  An adequate time out was performed.   A skin incision was made with the 11 blade scalpel in the umbilicus. I entered her abdominal cavity through the natural umbilical defect with a Burnard. The S retractor was placed through the incision and then port was placed under direct visualization.  Laparoscope was inserted and confirmed to be in the cavity.  Pneumoperitoneum achieved to a pressure of 15. Below the point of entry and the pelvis was inspected with no evidence of injury.   The scalpel was then used to make one 5mm incsion in the LLQ and the RLQ. Pedi-ports were inserted. The fallopian tubes were cauterized, cut, and detached from their surrounding pelvic structures with the enseal. No bleeding was noted. The specimens were removed from through the ports. The pelvis was inspected and was hemostatic. All ports were then withdrawn and the gas drained from abdomen. They were then closed in a subcuticular fashion with 4-0 vicryl and dermabond was placed. Skin incisions were numbed with 11 cc of 0.25% bupivacaine .   The patient will be discharged to home as per PACU criteria.  Routine postoperative instructions given.    Vina Solian, MD Attending Obstetrician & Gynecologist, [facility] for [facility], [facility]

## 2023-10-13 NOTE — Discharge Instructions (Signed)

## 2023-10-13 NOTE — Transfer of Care (Signed)
 Immediate Anesthesia Transfer of Care Note  Patient: Heather Malone  Procedure(s) Performed: SALPINGECTOMY, BILATERAL, LAPAROSCOPIC (Bilateral: Pelvis)  Patient Location: PACU  Anesthesia Type:General  Level of Consciousness: awake, alert , oriented, and patient cooperative  Airway & Oxygen Therapy: Patient Spontanous Breathing  Post-op Assessment: Report given to RN, Post -op Vital signs reviewed and stable, Patient moving all extremities X 4, and Patient able to stick tongue midline  Post vital signs: Reviewed and stable  Last Vitals:  Vitals Value Taken Time  BP 113/88 10/13/23 14:45  Temp 97.7   Pulse 82 10/13/23 14:47  Resp 15 10/13/23 14:47  SpO2 94 % 10/13/23 14:47  Vitals shown include unfiled device data.  Last Pain:  Vitals:   10/13/23 1312  TempSrc: Oral  PainSc: 0-No pain      Patients Stated Pain Goal: 5 (10/13/23 1312)  Complications: No notable events documented.

## 2023-10-13 NOTE — Anesthesia Preprocedure Evaluation (Addendum)
 Anesthesia Evaluation  Patient identified by MRN, date of birth, ID band Patient awake    Reviewed: Allergy & Precautions, NPO status , Patient's Chart, lab work & pertinent test results  Airway Mallampati: II  TM Distance: >3 FB Neck ROM: Full    Dental no notable dental hx.    Pulmonary asthma , former smoker   Pulmonary exam normal        Cardiovascular hypertension, Normal cardiovascular exam     Neuro/Psych  Headaches  Anxiety        GI/Hepatic negative GI ROS, Neg liver ROS,,,  Endo/Other  negative endocrine ROS    Renal/GU negative Renal ROS     Musculoskeletal negative musculoskeletal ROS (+)    Abdominal  (+) + obese  Peds  Hematology negative hematology ROS (+)   Anesthesia Other Findings Undesired Fertility  Reproductive/Obstetrics Hcg negative                              Anesthesia Physical Anesthesia Plan  ASA: 2  Anesthesia Plan: General   Post-op Pain Management:    Induction: Intravenous  PONV Risk Score and Plan: 4 or greater and Ondansetron , Dexamethasone , Midazolam , Scopolamine  patch - Pre-op and Treatment may vary due to age or medical condition  Airway Management Planned: Oral ETT  Additional Equipment:   Intra-op Plan:   Post-operative Plan: Extubation in OR  Informed Consent: I have reviewed the patients History and Physical, chart, labs and discussed the procedure including the risks, benefits and alternatives for the proposed anesthesia with the patient or authorized representative who has indicated his/her understanding and acceptance.     Dental advisory given  Plan Discussed with: CRNA  Anesthesia Plan Comments:          Anesthesia Quick Evaluation

## 2023-10-13 NOTE — Anesthesia Procedure Notes (Signed)
 Procedure Name: Intubation Date/Time: 10/13/2023 2:02 PM  Performed by: Viviana Almarie DASEN, CRNAPre-anesthesia Checklist: Patient identified, Emergency Drugs available, Suction available and Patient being monitored Patient Re-evaluated:Patient Re-evaluated prior to induction Oxygen Delivery Method: Circle System Utilized Preoxygenation: Pre-oxygenation with 100% oxygen Induction Type: IV induction Ventilation: Mask ventilation without difficulty Tube type: Oral Number of attempts: 1 Airway Equipment and Method: Stylet and Oral airway Placement Confirmation: ETT inserted through vocal cords under direct vision, positive ETCO2 and breath sounds checked- equal and bilateral Tube secured with: Tape Dental Injury: Teeth and Oropharynx as per pre-operative assessment

## 2023-10-13 NOTE — H&P (Signed)
 [facility] Attending History and Physical  Heather Malone is a 33 y.o. [id number] who presented to [facility] today for bilateral salpingectomy. She does not want nay more children and would like her tubes removed completely.     Past Medical History:  Diagnosis Date   Anemia    Anxiety    Asthma    albuterol  prn   Chlamydia 03/2023   Family history of adverse reaction to anesthesia    grandmother died from anesthesia unsure why   Headache    migraines   Hx of trichomoniasis    Hx of varicella    Neck injury, subsequent encounter 07/26/2016   Placental abruption in third trimester 09/04/2014   Pre-eclampsia    Subchorionic hemorrhage in third trimester 07/26/2014   Velamentous insertion of umbilical cord    Past Surgical History:  Procedure Laterality Date   BREAST BIOPSY Right 07/29/2023   US  RT BREAST BX W LOC DEV 1ST LESION IMG BX SPEC US  GUIDE 07/29/2023 [facility]   BREAST BIOPSY  08/21/2023   US  RT RADIOACTIVE SEED LOC 08/21/2023 [facility]   TONSILLECTOMY     OB History  Gravida Para Term Preterm AB Living  3 3 2 1  0 3  SAB IAB Ectopic Multiple Live Births  0 0 0 0 3    # Outcome Date GA Lbr Len/2nd Weight Sex Type Anes PTL Lv  3 Term 05/21/23 [redacted]w[redacted]d 32:01 3510 g F Vag-Spont None  LIV  2 Preterm 09/12/20 [redacted]w[redacted]d 03:28 1900 g F Vag-Spont None  LIV     Birth Comments: IOL for severe pre-eclampsia  1 Term 09/05/14 [redacted]w[redacted]d 1578:09 / 00:10 3340 g M Vag-Spont None  LIV     Birth Comments: placenta abrution, velamentous cord insertion  Patient denies any other pertinent gynecologic issues.  No current facility-administered medications on file prior to encounter.   No current outpatient medications on file prior to encounter.   Allergies  Allergen Reactions   Tylenol  [Acetaminophen ] Other (See Comments)    Pt states Tylenol  causes Migraines    Social History:   reports that she has quit smoking. Her smoking use included cigars. She  has never used smokeless tobacco. She reports that she does not currently use alcohol. She reports that she does not currently use drugs after having used the following drugs: Marijuana. Family History  Problem Relation Age of Onset   Hypertension Mother    Hypertension Maternal Grandmother    Heart attack Maternal Grandmother    Hypertension Maternal Grandfather    Cancer Maternal Grandfather    Diabetes Paternal Grandmother     Review of Systems: Pertinent items noted in HPI and remainder of comprehensive ROS otherwise negative.  PHYSICAL EXAM: Blood pressure (!) 127/95, pulse 78, temperature 98 F (36.7 C), temperature source Oral, resp. rate 16, height 5' 2 (1.575 m), weight 79.4 kg, SpO2 99%, not currently breastfeeding. CONSTITUTIONAL: Well-developed, well-nourished female in no acute distress.  HENT:  Normocephalic, atraumatic, External right and left ear normal. Oropharynx is clear and moist EYES: Conjunctivae and EOM are normal. Pupils are equal, round, and reactive to light. No scleral icterus.  NECK: Normal range of motion, supple, no masses SKIN: Skin is warm and dry. No rash noted. Not diaphoretic. No erythema. No pallor. NEUROLOGIC: Alert and oriented to person, place, and time. Normal reflexes, muscle tone coordination. No cranial nerve deficit noted. PSYCHIATRIC: Normal mood and affect. Normal behavior. Normal judgment and thought content. CARDIOVASCULAR: Normal heart rate  noted RESPIRATORY: Effort and breath sounds normal, no problems with respiration noted ABDOMEN: Soft, nontender, nondistended. PELVIC: Not examined MUSCULOSKELETAL: Normal range of motion. No tenderness.  No cyanosis, clubbing, or edema.  2+ distal pulses.  Labs: Results for orders placed or performed during the hospital encounter of 10/13/23 (from the past 2 weeks)  Pregnancy, urine POC   Collection Time: 10/13/23 12:56 PM  Result Value Ref Range   Preg Test, Ur NEGATIVE NEGATIVE  Type and  screen   Collection Time: 10/13/23  1:12 PM  Result Value Ref Range   ABO/RH(D) PENDING    Antibody Screen PENDING    Sample Expiration      10/16/2023,2359 Performed at [facility], [address], [place], [place] [address]     Imaging Studies: No results found.  Assessment: Active Problems:   Unwanted fertility   Plan: - She desires permanent sterilization. Discussed alternatives including LARC options and vasectomy. She declines these options.  - Discussed surgery of salpingectomy, meaning complete removal of tubes. No option for reversal.  - Risks of surgery include but are not limited to: bleeding, infection, injury to surrounding organs/tissues (i.e. bowel/bladder/ureters), need for additional procedures, wound complications, hospital re-admission, and conversion to open surgery, VTE - Reviewed restrictions and recovery following surgery - Medicaid consent signed 08/04/23   Vina Solian, MD, FACOG Obstetrician & Gynecologist, [facility] for [facility], [facility]

## 2023-10-14 ENCOUNTER — Encounter (HOSPITAL_COMMUNITY): Payer: Self-pay | Admitting: Obstetrics and Gynecology

## 2023-10-14 ENCOUNTER — Telehealth: Payer: Self-pay | Admitting: Family Medicine

## 2023-10-14 DIAGNOSIS — Z3009 Encounter for other general counseling and advice on contraception: Secondary | ICD-10-CM

## 2023-10-14 MED ORDER — OXYCODONE HCL 5 MG PO TABS: 10.0000 mg | ORAL_TABLET | ORAL | 0 refills | Status: AC | PRN

## 2023-10-14 NOTE — Telephone Encounter (Signed)
 This patient is calling because she recently had surgery and she is out of her Oxy's she is requesting a refill be sent to her pharmacy today. She says with them being only 5mg s she is having to take 2 at a time.

## 2023-10-14 NOTE — Telephone Encounter (Signed)
 Dr. Cleatus permitted refill on oxy.  Pt advised to utilize the ibuprofen  800 mg every 8 hours so that she would not need to use the oxy as much.  Pt verbalized understanding with no further questions.   Jewel Venditto,RN  10/14/23

## 2023-10-14 NOTE — Addendum Note (Signed)
 Addended by: CLEATUS MOCCASIN A on: 10/14/2023 10:25 AM   Modules accepted: Orders

## 2023-10-14 NOTE — Anesthesia Postprocedure Evaluation (Signed)
 Anesthesia Post Note  Patient: Heather Malone  Procedure(s) Performed: SALPINGECTOMY, BILATERAL, LAPAROSCOPIC (Bilateral: Pelvis)     Patient location during evaluation: PACU Anesthesia Type: General Level of consciousness: awake Pain management: pain level controlled Vital Signs Assessment: post-procedure vital signs reviewed and stable Respiratory status: spontaneous breathing, nonlabored ventilation and respiratory function stable Cardiovascular status: blood pressure returned to baseline and stable Postop Assessment: no apparent nausea or vomiting Anesthetic complications: no   No notable events documented.  Last Vitals:  Vitals:   10/13/23 1515 10/13/23 1530  BP: (!) 115/102 (!) 124/106  Pulse: 74 83  Resp: 15 10  Temp:    SpO2: 95% 97%    Last Pain:  Vitals:   10/13/23 1542  TempSrc:   PainSc: 5                  Blakeley Margraf P Keegen Heffern

## 2023-10-14 NOTE — Telephone Encounter (Signed)
 Pt is requesting a refill on her oxycodone  and if it can be the 10 mg dosage.  Pt states that she was not provided with ibuprofen  as her pharmacy did not have it.  I explained to the pt that she can take otc ibuprofen  200 mg 4 tablets every 8 hours so that she can around the clock pain management without the possible need of the oxy.  I informed pt that I would need to reach to Dr. Cleatus to see if she would approve and that I would call by the end of the day.  Pt verbalized understanding.   Shaden Lacher,RN  10/14/23

## 2023-10-17 LAB — SURGICAL PATHOLOGY

## 2023-10-20 ENCOUNTER — Ambulatory Visit: Payer: Self-pay | Admitting: Obstetrics and Gynecology

## 2023-11-04 ENCOUNTER — Ambulatory Visit

## 2023-11-09 ENCOUNTER — Ambulatory Visit: Admitting: Obstetrics and Gynecology
